# Patient Record
Sex: Female | Born: 1945 | Race: White | Hispanic: No | Marital: Married | Smoking: Never smoker
Health system: Southern US, Community
[De-identification: ages and names within clinical notes are randomized; demographics above are authoritative.]

## PROBLEM LIST (undated history)

## (undated) DIAGNOSIS — E785 Hyperlipidemia, unspecified: Secondary | ICD-10-CM

## (undated) DIAGNOSIS — F341 Dysthymic disorder: Secondary | ICD-10-CM

## (undated) DIAGNOSIS — Z85820 Personal history of malignant melanoma of skin: Secondary | ICD-10-CM

## (undated) DIAGNOSIS — F411 Generalized anxiety disorder: Secondary | ICD-10-CM

## (undated) DIAGNOSIS — I10 Essential (primary) hypertension: Secondary | ICD-10-CM

## (undated) DIAGNOSIS — K589 Irritable bowel syndrome without diarrhea: Secondary | ICD-10-CM

## (undated) DIAGNOSIS — M545 Low back pain: Secondary | ICD-10-CM

## (undated) DIAGNOSIS — K219 Gastro-esophageal reflux disease without esophagitis: Secondary | ICD-10-CM

## (undated) DIAGNOSIS — N182 Chronic kidney disease, stage 2 (mild): Secondary | ICD-10-CM

## (undated) DIAGNOSIS — C801 Malignant (primary) neoplasm, unspecified: Secondary | ICD-10-CM

## (undated) DIAGNOSIS — F1010 Alcohol abuse, uncomplicated: Secondary | ICD-10-CM

## (undated) DIAGNOSIS — Z86010 Personal history of colonic polyps: Secondary | ICD-10-CM

## (undated) DIAGNOSIS — N9489 Other specified conditions associated with female genital organs and menstrual cycle: Secondary | ICD-10-CM

## (undated) DIAGNOSIS — M5030 Other cervical disc degeneration, unspecified cervical region: Secondary | ICD-10-CM

## (undated) DIAGNOSIS — J309 Allergic rhinitis, unspecified: Secondary | ICD-10-CM

## (undated) DIAGNOSIS — F0390 Unspecified dementia without behavioral disturbance: Secondary | ICD-10-CM

## (undated) DIAGNOSIS — K5730 Diverticulosis of large intestine without perforation or abscess without bleeding: Secondary | ICD-10-CM

## (undated) DIAGNOSIS — R413 Other amnesia: Principal | ICD-10-CM

## (undated) HISTORY — DX: Personal history of colonic polyps: Z86.010

## (undated) HISTORY — DX: Hyperlipidemia, unspecified: E78.5

## (undated) HISTORY — DX: Gastro-esophageal reflux disease without esophagitis: K21.9

## (undated) HISTORY — DX: Irritable bowel syndrome without diarrhea: K58.9

## (undated) HISTORY — DX: Personal history of malignant melanoma of skin: Z85.820

## (undated) HISTORY — DX: Other cervical disc degeneration, unspecified cervical region: M50.30

## (undated) HISTORY — DX: Allergic rhinitis, unspecified: J30.9

## (undated) HISTORY — PX: COLONOSCOPY: SHX174

## (undated) HISTORY — DX: Essential (primary) hypertension: I10

## (undated) HISTORY — DX: Diverticulosis of large intestine without perforation or abscess without bleeding: K57.30

## (undated) HISTORY — DX: Malignant (primary) neoplasm, unspecified: C80.1

## (undated) HISTORY — DX: Dysthymic disorder: F34.1

## (undated) HISTORY — DX: Other amnesia: R41.3

## (undated) HISTORY — DX: Generalized anxiety disorder: F41.1

## (undated) HISTORY — DX: Low back pain: M54.5

## (undated) HISTORY — DX: Alcohol abuse, uncomplicated: F10.10

## (undated) HISTORY — PX: APPENDECTOMY: SHX54

---

## 2000-03-08 ENCOUNTER — Other Ambulatory Visit: Admission: RE | Admit: 2000-03-08 | Discharge: 2000-03-08 | Payer: Self-pay | Admitting: Gynecology

## 2001-05-15 ENCOUNTER — Other Ambulatory Visit: Admission: RE | Admit: 2001-05-15 | Discharge: 2001-05-15 | Payer: Self-pay | Admitting: Gynecology

## 2002-03-08 ENCOUNTER — Ambulatory Visit (HOSPITAL_COMMUNITY): Admission: RE | Admit: 2002-03-08 | Discharge: 2002-03-08 | Payer: Self-pay | Admitting: Gastroenterology

## 2002-03-08 ENCOUNTER — Encounter (INDEPENDENT_AMBULATORY_CARE_PROVIDER_SITE_OTHER): Payer: Self-pay | Admitting: *Deleted

## 2002-03-08 DIAGNOSIS — Z860100 Personal history of colon polyps, unspecified: Secondary | ICD-10-CM

## 2002-03-08 HISTORY — DX: Personal history of colonic polyps: Z86.010

## 2002-03-08 HISTORY — DX: Personal history of colon polyps, unspecified: Z86.0100

## 2002-07-17 ENCOUNTER — Other Ambulatory Visit: Admission: RE | Admit: 2002-07-17 | Discharge: 2002-07-17 | Payer: Self-pay | Admitting: Gynecology

## 2003-07-22 ENCOUNTER — Other Ambulatory Visit: Admission: RE | Admit: 2003-07-22 | Discharge: 2003-07-22 | Payer: Self-pay | Admitting: Gynecology

## 2004-07-28 ENCOUNTER — Other Ambulatory Visit: Admission: RE | Admit: 2004-07-28 | Discharge: 2004-07-28 | Payer: Self-pay | Admitting: Gynecology

## 2005-06-16 ENCOUNTER — Ambulatory Visit: Payer: Self-pay | Admitting: Internal Medicine

## 2005-07-15 ENCOUNTER — Ambulatory Visit: Payer: Self-pay | Admitting: Internal Medicine

## 2005-07-22 ENCOUNTER — Ambulatory Visit: Payer: Self-pay | Admitting: Internal Medicine

## 2005-08-12 ENCOUNTER — Other Ambulatory Visit: Admission: RE | Admit: 2005-08-12 | Discharge: 2005-08-12 | Payer: Self-pay | Admitting: Gynecology

## 2005-08-26 ENCOUNTER — Ambulatory Visit: Payer: Self-pay | Admitting: Internal Medicine

## 2006-07-14 ENCOUNTER — Ambulatory Visit: Payer: Self-pay | Admitting: Internal Medicine

## 2006-07-14 LAB — CONVERTED CEMR LAB
Basophils Relative: 0.9 % (ref 0.0–1.0)
CO2: 29 meq/L (ref 19–32)
GFR calc non Af Amer: 78 mL/min
Glucose, Bld: 106 mg/dL — ABNORMAL HIGH (ref 70–99)
Hemoglobin: 13 g/dL (ref 12.0–15.0)
Ketones, ur: NEGATIVE mg/dL
Leukocytes, UA: NEGATIVE
MCV: 91.8 fL (ref 78.0–100.0)
Monocytes Relative: 7.7 % (ref 3.0–11.0)
Neutro Abs: 3.3 10*3/uL (ref 1.4–7.7)
Nitrite: NEGATIVE
Platelets: 194 10*3/uL (ref 150–400)
Potassium: 4.1 meq/L (ref 3.5–5.1)
RBC: 4.15 M/uL (ref 3.87–5.11)
RDW: 11.5 % (ref 11.5–14.6)
Sodium: 142 meq/L (ref 135–145)
Specific Gravity, Urine: 1.01 (ref 1.000–1.03)
Total Protein: 7 g/dL (ref 6.0–8.3)
WBC: 6.1 10*3/uL (ref 4.5–10.5)
pH: 8 (ref 5.0–8.0)

## 2006-07-21 ENCOUNTER — Ambulatory Visit: Payer: Self-pay | Admitting: Internal Medicine

## 2007-06-09 ENCOUNTER — Ambulatory Visit: Payer: Self-pay | Admitting: Gastroenterology

## 2007-06-23 ENCOUNTER — Encounter: Payer: Self-pay | Admitting: Internal Medicine

## 2007-06-23 DIAGNOSIS — J309 Allergic rhinitis, unspecified: Secondary | ICD-10-CM

## 2007-06-23 DIAGNOSIS — F411 Generalized anxiety disorder: Secondary | ICD-10-CM

## 2007-06-23 DIAGNOSIS — N951 Menopausal and female climacteric states: Secondary | ICD-10-CM | POA: Insufficient documentation

## 2007-06-23 DIAGNOSIS — K219 Gastro-esophageal reflux disease without esophagitis: Secondary | ICD-10-CM

## 2007-06-23 DIAGNOSIS — I10 Essential (primary) hypertension: Secondary | ICD-10-CM | POA: Insufficient documentation

## 2007-06-23 DIAGNOSIS — K589 Irritable bowel syndrome without diarrhea: Secondary | ICD-10-CM

## 2007-06-23 HISTORY — DX: Generalized anxiety disorder: F41.1

## 2007-06-23 HISTORY — DX: Essential (primary) hypertension: I10

## 2007-06-23 HISTORY — DX: Allergic rhinitis, unspecified: J30.9

## 2007-06-23 HISTORY — DX: Irritable bowel syndrome, unspecified: K58.9

## 2007-06-23 HISTORY — DX: Gastro-esophageal reflux disease without esophagitis: K21.9

## 2007-06-24 ENCOUNTER — Encounter: Payer: Self-pay | Admitting: Internal Medicine

## 2007-06-24 DIAGNOSIS — E785 Hyperlipidemia, unspecified: Secondary | ICD-10-CM

## 2007-06-24 DIAGNOSIS — M545 Low back pain, unspecified: Secondary | ICD-10-CM

## 2007-06-24 DIAGNOSIS — Z85828 Personal history of other malignant neoplasm of skin: Secondary | ICD-10-CM

## 2007-06-24 HISTORY — DX: Hyperlipidemia, unspecified: E78.5

## 2007-06-24 HISTORY — DX: Low back pain, unspecified: M54.50

## 2007-07-13 ENCOUNTER — Ambulatory Visit: Payer: Self-pay | Admitting: Gastroenterology

## 2007-07-13 LAB — HM COLONOSCOPY

## 2007-07-28 ENCOUNTER — Ambulatory Visit: Payer: Self-pay | Admitting: Internal Medicine

## 2007-07-28 LAB — CONVERTED CEMR LAB
AST: 23 units/L (ref 0–37)
Albumin: 4 g/dL (ref 3.5–5.2)
Alkaline Phosphatase: 85 units/L (ref 39–117)
Basophils Absolute: 0 10*3/uL (ref 0.0–0.1)
Bilirubin, Direct: 0.2 mg/dL (ref 0.0–0.3)
Cholesterol: 178 mg/dL (ref 0–200)
Creatinine, Ser: 0.7 mg/dL (ref 0.4–1.2)
Eosinophils Relative: 9.9 % — ABNORMAL HIGH (ref 0.0–5.0)
GFR calc Af Amer: 110 mL/min
GFR calc non Af Amer: 91 mL/min
Glucose, Bld: 114 mg/dL — ABNORMAL HIGH (ref 70–99)
HCT: 38.9 % (ref 36.0–46.0)
HDL: 49.8 mg/dL (ref >39.0)
Hemoglobin, Urine: NEGATIVE
Hemoglobin: 13.4 g/dL (ref 12.0–15.0)
MCHC: 34.5 g/dL (ref 30.0–36.0)
MCV: 92.2 fL (ref 78.0–100.0)
Monocytes Relative: 6.9 % (ref 3.0–11.0)
Neutro Abs: 3.2 10*3/uL (ref 1.4–7.7)
Neutrophils Relative %: 51.3 % (ref 43.0–77.0)
TSH: 3.25 microintl units/mL (ref 0.35–5.50)
Total Bilirubin: 0.6 mg/dL (ref 0.3–1.2)
Total Protein: 7.3 g/dL (ref 6.0–8.3)
Urine Glucose: NEGATIVE mg/dL
Urobilinogen, UA: 0.2 (ref 0.0–1.0)

## 2007-07-31 ENCOUNTER — Encounter: Payer: Self-pay | Admitting: Internal Medicine

## 2007-08-02 LAB — CONVERTED CEMR LAB
ALT: 24 units/L (ref 0–35)
Alkaline Phosphatase: 85 units/L (ref 39–117)
Basophils Relative: 0.8 % (ref 0.0–1.0)
Bilirubin, Direct: 0.2 mg/dL (ref 0.0–0.3)
CO2: 30 meq/L (ref 19–32)
Chloride: 107 meq/L (ref 96–112)
Crystals: NEGATIVE
Eosinophils Relative: 9.9 % — ABNORMAL HIGH (ref 0.0–5.0)
GFR calc Af Amer: 110 mL/min
Glucose, Bld: 114 mg/dL — ABNORMAL HIGH (ref 70–99)
HDL: 49.8 mg/dL (ref >39.0)
LDL Cholesterol: 103 mg/dL — ABNORMAL HIGH (ref 0–99)
Lymphocytes Relative: 31.1 % (ref 12.0–46.0)
MCHC: 34.5 g/dL (ref 30.0–36.0)
MCV: 92.2 fL (ref 78.0–100.0)
Potassium: 3.8 meq/L (ref 3.5–5.1)
RBC: 4.22 M/uL (ref 3.87–5.11)
Sodium: 143 meq/L (ref 135–145)
TSH: 3.25 microintl units/mL (ref 0.35–5.50)
Total Protein: 7.3 g/dL (ref 6.0–8.3)
Triglycerides: 127 mg/dL (ref 0–149)
VLDL: 25 mg/dL (ref 0–40)
WBC: 6.1 10*3/uL (ref 4.5–10.5)
pH: 6.5 (ref 5.0–8.0)

## 2007-08-04 ENCOUNTER — Ambulatory Visit: Payer: Self-pay | Admitting: Internal Medicine

## 2007-08-04 DIAGNOSIS — K5730 Diverticulosis of large intestine without perforation or abscess without bleeding: Secondary | ICD-10-CM

## 2007-08-04 DIAGNOSIS — Z85820 Personal history of malignant melanoma of skin: Secondary | ICD-10-CM

## 2007-08-04 HISTORY — DX: Diverticulosis of large intestine without perforation or abscess without bleeding: K57.30

## 2007-08-04 HISTORY — DX: Personal history of malignant melanoma of skin: Z85.820

## 2007-08-21 ENCOUNTER — Encounter: Payer: Self-pay | Admitting: Internal Medicine

## 2007-12-18 DIAGNOSIS — M5030 Other cervical disc degeneration, unspecified cervical region: Secondary | ICD-10-CM

## 2007-12-18 HISTORY — DX: Other cervical disc degeneration, unspecified cervical region: M50.30

## 2008-01-16 ENCOUNTER — Ambulatory Visit: Payer: Self-pay | Admitting: Internal Medicine

## 2008-08-16 ENCOUNTER — Ambulatory Visit: Payer: Self-pay | Admitting: Internal Medicine

## 2008-08-16 LAB — CONVERTED CEMR LAB
AST: 23 units/L (ref 0–37)
Albumin: 3.7 g/dL (ref 3.5–5.2)
BUN: 8 mg/dL (ref 6–23)
Basophils Absolute: 0 10*3/uL (ref 0.0–0.1)
Basophils Relative: 0.7 % (ref 0.0–3.0)
CO2: 30 meq/L (ref 19–32)
Creatinine, Ser: 0.8 mg/dL (ref 0.4–1.2)
Glucose, Bld: 98 mg/dL (ref 70–99)
HCT: 37.7 % (ref 36.0–46.0)
Hemoglobin: 13.3 g/dL (ref 12.0–15.0)
Ketones, ur: NEGATIVE mg/dL
LDL Cholesterol: 85 mg/dL (ref 0–99)
MCHC: 35.4 g/dL (ref 30.0–36.0)
Monocytes Absolute: 0.4 10*3/uL (ref 0.1–1.0)
Monocytes Relative: 7.8 % (ref 3.0–12.0)
Mucus, UA: NEGATIVE
Nitrite: NEGATIVE
Platelets: 189 10*3/uL (ref 150–400)
Potassium: 4.1 meq/L (ref 3.5–5.1)
RBC: 4.12 M/uL (ref 3.87–5.11)
RDW: 11.8 % (ref 11.5–14.6)
Sodium: 138 meq/L (ref 135–145)
Total Bilirubin: 0.5 mg/dL (ref 0.3–1.2)
Urine Glucose: NEGATIVE mg/dL

## 2008-08-20 ENCOUNTER — Ambulatory Visit: Payer: Self-pay | Admitting: Internal Medicine

## 2008-08-20 DIAGNOSIS — R9431 Abnormal electrocardiogram [ECG] [EKG]: Secondary | ICD-10-CM

## 2008-08-29 ENCOUNTER — Telehealth (INDEPENDENT_AMBULATORY_CARE_PROVIDER_SITE_OTHER): Payer: Self-pay | Admitting: *Deleted

## 2008-09-04 ENCOUNTER — Encounter: Payer: Self-pay | Admitting: Internal Medicine

## 2008-09-04 ENCOUNTER — Ambulatory Visit: Payer: Self-pay

## 2008-11-20 ENCOUNTER — Telehealth: Payer: Self-pay | Admitting: Internal Medicine

## 2008-11-25 ENCOUNTER — Telehealth: Payer: Self-pay | Admitting: Internal Medicine

## 2009-02-14 ENCOUNTER — Ambulatory Visit: Payer: Self-pay | Admitting: Internal Medicine

## 2009-02-14 DIAGNOSIS — R21 Rash and other nonspecific skin eruption: Secondary | ICD-10-CM

## 2009-02-19 ENCOUNTER — Telehealth: Payer: Self-pay | Admitting: Internal Medicine

## 2009-06-12 ENCOUNTER — Ambulatory Visit: Payer: Self-pay | Admitting: Internal Medicine

## 2009-06-12 DIAGNOSIS — F341 Dysthymic disorder: Secondary | ICD-10-CM

## 2009-06-12 DIAGNOSIS — R42 Dizziness and giddiness: Secondary | ICD-10-CM

## 2009-06-12 HISTORY — DX: Dysthymic disorder: F34.1

## 2009-06-20 ENCOUNTER — Inpatient Hospital Stay (HOSPITAL_COMMUNITY): Admission: AD | Admit: 2009-06-20 | Discharge: 2009-06-22 | Payer: Self-pay | Admitting: Psychiatry

## 2009-06-20 ENCOUNTER — Telehealth: Payer: Self-pay | Admitting: Internal Medicine

## 2009-06-20 ENCOUNTER — Ambulatory Visit: Payer: Self-pay | Admitting: Psychiatry

## 2009-06-26 ENCOUNTER — Ambulatory Visit: Payer: Self-pay | Admitting: Internal Medicine

## 2009-06-26 DIAGNOSIS — E876 Hypokalemia: Secondary | ICD-10-CM | POA: Insufficient documentation

## 2009-06-26 LAB — CONVERTED CEMR LAB
BUN: 11 mg/dL (ref 6–23)
CO2: 31 meq/L (ref 19–32)
Calcium: 9.3 mg/dL (ref 8.4–10.5)
Chloride: 94 meq/L — ABNORMAL LOW (ref 96–112)
Creatinine, Ser: 0.7 mg/dL (ref 0.4–1.2)
Potassium: 3.5 meq/L (ref 3.5–5.1)

## 2009-07-02 ENCOUNTER — Ambulatory Visit (HOSPITAL_COMMUNITY): Payer: Self-pay | Admitting: Psychiatry

## 2009-08-05 LAB — HM MAMMOGRAPHY: HM Mammogram: NORMAL

## 2009-08-08 ENCOUNTER — Ambulatory Visit: Payer: Self-pay | Admitting: Internal Medicine

## 2009-08-08 LAB — CONVERTED CEMR LAB
BUN: 11 mg/dL (ref 6–23)
Basophils Absolute: 0.1 10*3/uL (ref 0.0–0.1)
Basophils Relative: 1.2 % (ref 0.0–3.0)
Bilirubin Urine: NEGATIVE
CO2: 30 meq/L (ref 19–32)
Calcium: 9.3 mg/dL (ref 8.4–10.5)
Chloride: 102 meq/L (ref 96–112)
Eosinophils Relative: 6.1 % — ABNORMAL HIGH (ref 0.0–5.0)
GFR calc non Af Amer: 107.61 mL/min (ref >60)
HDL: 50.2 mg/dL (ref >39.00)
Hemoglobin, Urine: NEGATIVE
Ketones, ur: NEGATIVE mg/dL
LDL Cholesterol: 80 mg/dL (ref 0–99)
Leukocytes, UA: NEGATIVE
Monocytes Relative: 8 % (ref 3.0–12.0)
Neutro Abs: 4.1 10*3/uL (ref 1.4–7.7)
Neutrophils Relative %: 62.8 % (ref 43.0–77.0)
Nitrite: NEGATIVE
Potassium: 3.9 meq/L (ref 3.5–5.1)
Total CHOL/HDL Ratio: 3
Urobilinogen, UA: 1 (ref 0.0–1.0)
VLDL: 13.6 mg/dL (ref 0.0–40.0)
WBC: 6.5 10*3/uL (ref 4.5–10.5)
pH: 6.5 (ref 5.0–8.0)

## 2009-08-15 ENCOUNTER — Ambulatory Visit: Payer: Self-pay | Admitting: Internal Medicine

## 2010-02-12 ENCOUNTER — Ambulatory Visit: Payer: Self-pay | Admitting: Internal Medicine

## 2010-05-04 ENCOUNTER — Telehealth: Payer: Self-pay | Admitting: Internal Medicine

## 2010-06-04 ENCOUNTER — Telehealth: Payer: Self-pay | Admitting: Internal Medicine

## 2010-06-09 ENCOUNTER — Telehealth: Payer: Self-pay | Admitting: Internal Medicine

## 2010-08-12 ENCOUNTER — Ambulatory Visit: Payer: Self-pay | Admitting: Internal Medicine

## 2010-08-13 LAB — CONVERTED CEMR LAB
AST: 26 units/L (ref 0–37)
Albumin: 4.2 g/dL (ref 3.5–5.2)
Alkaline Phosphatase: 93 units/L (ref 39–117)
Basophils Relative: 0.8 % (ref 0.0–3.0)
Bilirubin, Direct: 0.1 mg/dL (ref 0.0–0.3)
Calcium: 9.5 mg/dL (ref 8.4–10.5)
Creatinine, Ser: 0.8 mg/dL (ref 0.4–1.2)
GFR calc non Af Amer: 76.96 mL/min (ref >60)
HDL: 52.8 mg/dL (ref >39.00)
Hemoglobin, Urine: NEGATIVE
Hemoglobin: 13.6 g/dL (ref 12.0–15.0)
Ketones, ur: NEGATIVE mg/dL
LDL Cholesterol: 64 mg/dL (ref 0–99)
Lymphocytes Relative: 24.2 % (ref 12.0–46.0)
Monocytes Relative: 7.7 % (ref 3.0–12.0)
Neutro Abs: 4 10*3/uL (ref 1.4–7.7)
Neutrophils Relative %: 65.3 % (ref 43.0–77.0)
RBC: 4.35 M/uL (ref 3.87–5.11)
Sodium: 138 meq/L (ref 135–145)
Specific Gravity, Urine: 1.01 (ref 1.000–1.030)
Total CHOL/HDL Ratio: 3
Total Protein, Urine: NEGATIVE mg/dL
Total Protein: 7.2 g/dL (ref 6.0–8.3)
Urine Glucose: NEGATIVE mg/dL
Urobilinogen, UA: 1 (ref 0.0–1.0)
VLDL: 15.2 mg/dL (ref 0.0–40.0)
WBC: 6.1 10*3/uL (ref 4.5–10.5)

## 2010-08-19 ENCOUNTER — Encounter: Payer: Self-pay | Admitting: Internal Medicine

## 2010-08-19 ENCOUNTER — Ambulatory Visit: Payer: Self-pay | Admitting: Internal Medicine

## 2010-09-08 ENCOUNTER — Telehealth: Payer: Self-pay | Admitting: Internal Medicine

## 2010-09-16 ENCOUNTER — Telehealth: Payer: Self-pay | Admitting: Internal Medicine

## 2010-10-25 LAB — CONVERTED CEMR LAB: Pap Smear: NORMAL

## 2010-10-27 NOTE — Progress Notes (Signed)
Summary: medication ?  Phone Note Call from Patient Call back at Home Phone [phone] Call back at [phone]   Caller: Patient Summary of Call: Pt states that Deplin is not covered under her insurance plan, is there an alternative? please advise Initial call taken by: Brenton Grills MA,  June 09, 2010 9:50 AM  Follow-up for Phone Call        unfortunately not;  this was a "helpful" medication only (a nutritional suppelement) that some have found helpful;  there is no generic yet and no similar medication;  ok to hold off on this for now if too expensive, as she has her other medication from the recent visit as well Follow-up by: Corwin Levins MD,  June 09, 2010 1:14 PM  Additional Follow-up for Phone Call Additional follow up Details #1::        left message for pt to callback Additional Follow-up by: Brenton Grills MA,  June 09, 2010 1:35 PM    Additional Follow-up for Phone Call Additional follow up Details #2::    pt informed Follow-up by: Brenton Grills MA,  June 09, 2010 2:03 PM

## 2010-10-27 NOTE — Progress Notes (Signed)
Summary: Please advise  Phone Note Call from Patient   Caller: Patient [phone] Summary of Call: Pt called stating that she is still experiencing significant depressive sxs. Pt has been taking medications as Rxd but has not had an improvement. Pt is requesting JWJ advise her, does she need to have another OV to re-evaluate sxs and meds or a referral? Please advise Initial call taken by: Malee Pyle, CMA,  June 04, 2010 9:05 AM  Follow-up for Phone Call        ok for deplin - 1 per day (a sort of vitamin to help with depression), and also referral to D Lugo Follow-up by: Corwin Levins MD,  June 04, 2010 1:15 PM  Additional Follow-up for Phone Call Additional follow up Details #1::        left message on machine for pt to return my call  Windi Pyle, CMA  June 04, 2010 1:42 PM   Pt informed Additional Follow-up by: Mischell Pyle, CMA,  June 04, 2010 1:51 PM    New/Updated Medications: DEPLIN 15 MG TABS (L-METHYLFOLATE) 1po once daily Prescriptions: DEPLIN 15 MG TABS (L-METHYLFOLATE) 1po once daily  #30 x 11   Entered and Authorized by:   Corwin Levins MD   Signed by:   Corwin Levins MD on 06/04/2010   Method used:   Electronically to        [facility] [phone]* (retail)       [address]       [place], [place]  [address]       Ph: [phone]       Fax: [phone]   RxID:   [id number]

## 2010-10-27 NOTE — Progress Notes (Signed)
Summary: Bupropion  Phone Note Call from Patient Call back at [facility] Phone [phone]   Summary of Call: Pt was doing well on bupropion but says pharm changed manuf and she feels it is not working as well. Please advise.  Initial call taken by: Lamar Sprinkles, CMA,  May 04, 2010 6:08 PM  Follow-up for Phone Call        I have no other option at this time except if she can find the previous generic med at the same or different pharmacy;  I can provide a new rx if she wants;  also there is the option of the brand name same med (wellbutrin or budeprioin) but it will be more expensive);  there is no other comparable med of the same type; sometimes mood decreses on the same med after starting within the first 2 to 3 mo 's (the "poop-out effect") - we can also consider adding a low dose SSRI which has been an effective strategy for others Follow-up by: Corwin Levins MD,  May 04, 2010 8:25 PM  Additional Follow-up for Phone Call Additional follow up Details #1::        Pt decided to use brand name Welbutrin product to [facility] [address] Additional Follow-up by: Delaina Pyle, CMA,  May 05, 2010 10:44 AM    Additional Follow-up for Phone Call Additional follow up Details #2::    ok - will do Follow-up by: Corwin Levins MD,  May 05, 2010 1:02 PM  New/Updated Medications: WELLBUTRIN XL 300 MG XR24H-TAB (BUPROPION HCL) 1 by mouth once daily (DAW) Prescriptions: WELLBUTRIN XL 300 MG XR24H-TAB (BUPROPION HCL) 1 by mouth once daily (DAW)  #30 x 11   Entered and Authorized by:   Corwin Levins MD   Signed by:   Corwin Levins MD on 05/05/2010   Method used:   Electronically to        [facility] [phone]* (retail)       [address]       [place], [place]  [address]       Ph: [phone]       Fax: [phone]   RxID:   [id number]

## 2010-10-27 NOTE — Assessment & Plan Note (Signed)
Summary: 6 MTH PHYSICAL--[employer]---STC   Vital Signs:  Patient profile:   65 year old female Height:      63 inches Weight:      175.38 pounds BMI:     31.18 O2 Sat:      93 % on Room air Temp:     98.3 degrees F oral Pulse rate:   77 / minute BP sitting:   112 / 58  (left arm) Cuff size:   regular  Vitals Entered By: Zella Ball Ewing CMA (AAMA) (August 19, 2010 1:15 PM)  O2 Flow:  Room air  Preventive Care Screening     declines flu shot  CC: Adult Physical/RE   CC:  Adult Physical/RE.  History of Present Illness: here for wellness,  Pt denies CP, worsening sob, doe, wheezing, orthopnea, pnd, worsening LE edema, palps, dizziness or syncope  Pt denies new neuro symptoms such as headache, facial or extremity weakness  Pt denies polydipsia, polyuria.  Overall good compliance with meds, trying to follow low chol diet, wt stable, little excercise however  Still struggling with ongoign depression with recent med changes per psychiatry.  Denies worsening suicidal ideation, or panic.  No fever, wt loss, night sweats, loss of appetite or other constitutional symptoms  Overall good compliance with meds, and good tolerability.  Pt states good ability with ADL's, low fall risk, home safety reviewed and adequate, no significant change in hearing or vision, trying to follow lower chol diet, and occasionally active only with regular excercise.   Preventive Screening-Counseling & Management      Drug Use:  no.    Problems Prior to Update: 1)  Hypokalemia  (ICD-276.8) 2)  Anxiety Depression  (ICD-300.4) 3)  Dizziness  (ICD-780.4) 4)  Rash-nonvesicular  (ICD-782.1) 5)  Electrocardiogram, Abnormal  (ICD-794.31) 6)  Preventive Health Care  (ICD-V70.0) 7)  Need Proph Vaccination&inoculat Oth Viral Dz  (ICD-V04.89) 8)  Degenerative Disc Disease, Cervical Spine  (ICD-722.4) 9)  Preventive Health Care  (ICD-V70.0) 10)  Melanoma, Hx of  (ICD-V10.82) 11)  Family History Diabetes 1st Degree Relative   (ICD-V18.0) 12)  Diverticulosis, Colon  (ICD-562.10) 13)  Low Back Pain  (ICD-724.2) 14)  Hyperlipidemia  (ICD-272.4) 15)  Skin Cancer, Hx of  (ICD-V10.83) 16)  Anxiety  (ICD-300.00) 17)  Gerd  (ICD-530.81) 18)  Irritable Bowel Syndrome  (ICD-564.1) 19)  Morbid Obesity  (ICD-278.01) 20)  Colonic Polyps, Hx of  (ICD-V12.72) 21)  Hypertension  (ICD-401.9) 22)  Allergic Rhinitis  (ICD-477.9) 23)  Postmenopausal Status  (ICD-627.2)  Medications Prior to Update: 1)  Crestor 40 Mg Tabs (Rosuvastatin Calcium) .... 1/2 By Mouth Once Daily 2)  Amlodipine Besylate 10 Mg Tabs (Amlodipine Besylate) .Marland Kitchen.. 1po Once Daily- [employer] Member # [id number] 3)  Hydrochlorothiazide 25 Mg  Tabs (Hydrochlorothiazide) .... 1/2 By Mouth Once Daily 4)  Adult Aspirin Ec Low Strength 81 Mg Tbec (Aspirin) .Marland Kitchen.. 1po Once Daily 5)  Benazepril Hcl 40 Mg Tabs (Benazepril Hcl) .Marland Kitchen.. 1 By Mouth Once Daily - [employer] Member # [id number] 6)  Clonazepam 0.5 Mg Tabs (Clonazepam) .Marland Kitchen.. 1 By Mouth Two Times A Day  As Needed 7)  Abilify 5 Mg Tabs (Aripiprazole) .... 1/2  By Mouth Once Daily 8)  Wellbutrin Xl 300 Mg Xr24h-Tab (Bupropion Hcl) .Marland Kitchen.. 1 By Mouth Once Daily (Daw) 9)  Deplin 15 Mg Tabs (L-Methylfolate) .Marland Kitchen.. 1po Once Daily  Current Medications (verified): 1)  Crestor 40 Mg Tabs (Rosuvastatin Calcium) .... 1/2 By Mouth Once Daily 2)  Amlodipine Besylate 10  Mg Tabs (Amlodipine Besylate) .Marland Kitchen.. 1po Once Daily- [employer] Member # [id number] 3)  Adult Aspirin Ec Low Strength 81 Mg Tbec (Aspirin) .Marland Kitchen.. 1po Once Daily 4)  Clonazepam 0.5 Mg Tabs (Clonazepam) .Marland Kitchen.. 1 By Mouth Two Times A Day  As Needed - Per Dr Lugo/psychiatry 5)  Abilify 10 Mg Tabs (Aripiprazole) .Marland Kitchen.. 1po Once Daily 6)  Citalopram Hydrobromide 20 Mg Tabs (Citalopram Hydrobromide) .Marland Kitchen.. 1 By Mouth Once Daily 7)  Benazepril-Hydrochlorothiazide 20-12.5 Mg Tabs (Benazepril-Hydrochlorothiazide) .... 2 By Mouth Once Daily  Allergies (verified): 1)  Codeine Phosphate (Codeine  Phosphate)  Past History:  Past Medical History: Last updated: 08/04/2007 Allergic rhinitis Hypertension Colonic polyps, hx of - 1st at 65yo GERD Anxiety IBS Morbid Obesity Skin cancer, hx of - melanoma Hyperlipidemia Low back pain Diverticulosis, colon  Past Surgical History: Last updated: 06/23/2007 Appendectomy Caesarean section  Family History: Last updated: 08/04/2007 sister born with partial kidney Family History Hypertension Family History Diabetes 1st degree relative father died in accident mother with COPD 1st cousin died with colon ca 63 yo  Social History: Last updated: 08/19/2010 Never Smoked Alcohol use-yes Married 2 children work - Camera operator and accounts receivables Drug use-no  Risk Factors: Smoking Status: never (08/04/2007)  Social History: Reviewed history from 02/14/2009 and no changes required. Never Smoked Alcohol use-yes Married 2 children work - Oncologist Drug use-no Drug Use:  no  Review of Systems  The patient denies anorexia, fever, vision loss, decreased hearing, hoarseness, chest pain, syncope, dyspnea on exertion, peripheral edema, prolonged cough, headaches, hemoptysis, abdominal pain, melena, hematochezia, severe indigestion/heartburn, hematuria, muscle weakness, suspicious skin lesions, transient blindness, difficulty walking, unusual weight change, abnormal bleeding, enlarged lymph nodes, angioedema, and breast masses.         all otherwise negative per pt -    Physical Exam  General:  alert and overweight-appearing.   Head:  normocephalic and atraumatic.   Eyes:  vision grossly intact, pupils equal, and pupils round.   Ears:  R ear normal and L ear normal.   Nose:  no external deformity and no nasal discharge.   Mouth:  no gingival abnormalities and pharynx pink and moist.   Neck:  supple and no masses.   Lungs:  normal respiratory effort and normal breath sounds.   Heart:   normal rate and regular rhythm.   Abdomen:  soft, non-tender, and normal bowel sounds.   Msk:  no joint tenderness and no joint swelling.   Extremities:  no edema, no erythema  Neurologic:  strength normal in all extremities and gait normal.   Skin:  color normal and no rashes.   Psych:  depressed affect and flat affect.     Impression & Recommendations:  Problem # 1:  Preventive Health Care (ICD-V70.0) Overall doing well, age appropriate education and counseling updated, referral for preventive services and immunizations addressed, dietary counseling and smoking status adressed , most recent labs reviewed, ecg reviewed I have personally reviewed and have noted 1.The patient's medical and social history 2.Their use of alcohol, tobacco or illicit drugs 3.Their current medications and supplements 4. Functional ability including ADL's, fall risk, home safety risk, hearing & visual impairment  5.Diet and physical activities 6.Evidence for depression or mood disorders The patients weight, height, BMI  have been recorded in the chart I have made referrals, counseling and provided education to the patient based review of the above  Orders: EKG w/ Interpretation (93000)  Problem # 2:  HYPERTENSION (ICD-401.9)  The  following medications were removed from the medication list:    Hydrochlorothiazide 25 Mg Tabs (Hydrochlorothiazide) .Marland Kitchen... 1/2 by mouth once daily    Benazepril Hcl 40 Mg Tabs (Benazepril hcl) .Marland Kitchen... 1 by mouth once daily - [employer] member # [phone] Her updated medication list for this problem includes:    Amlodipine Besylate 10 Mg Tabs (Amlodipine besylate) .Marland Kitchen... 1po once daily- [employer] member # [id number]    Benazepril-hydrochlorothiazide 20-12.5 Mg Tabs (Benazepril-hydrochlorothiazide) .Marland Kitchen... 2 by mouth once daily  BP today: 112/58 Prior BP: 120/80 (02/12/2010)  Labs Reviewed: K+: 3.9 (08/12/2010) Creat: : 0.8 (08/12/2010)   Chol: 132 (08/12/2010)   HDL: 52.80 (08/12/2010)    LDL: 64 (08/12/2010)   TG: 76.0 (08/12/2010) stable overall by hx and exam, ok to continue meds/tx as is   Complete Medication List: 1)  Crestor 40 Mg Tabs (Rosuvastatin calcium) .... 1/2 by mouth once daily 2)  Amlodipine Besylate 10 Mg Tabs (Amlodipine besylate) .Marland Kitchen.. 1po once daily- [employer] member # [id number] 3)  Adult Aspirin Ec Low Strength 81 Mg Tbec (Aspirin) .Marland Kitchen.. 1po once daily 4)  Clonazepam 0.5 Mg Tabs (Clonazepam) .Marland Kitchen.. 1 by mouth two times a day  as needed - per dr lugo/psychiatry 5)  Abilify 10 Mg Tabs (Aripiprazole) .Marland Kitchen.. 1po once daily 6)  Citalopram Hydrobromide 20 Mg Tabs (Citalopram hydrobromide) .Marland Kitchen.. 1 by mouth once daily 7)  Benazepril-hydrochlorothiazide 20-12.5 Mg Tabs (Benazepril-hydrochlorothiazide) .... 2 by mouth once daily  Patient Instructions: 1)  please keep your appt with GYN for next monday, and consider asking about the bone density test 2)  after you are done with the current bottles for the fluid pill (hydrochlorothiazide) and benazepril  - ok to stop 3)  Then, start the combination pill - benazepril/HCT which is the same medication in one prescription 4)  Continue all previous medications as before this visit  5)  Please keep your followup appt with Dr Dub Mikes as planned 6)  Please schedule a follow-up appointment  in 1 yr with CPX and labs, or sooner if needed Prescriptions: AMLODIPINE BESYLATE 10 MG TABS (AMLODIPINE BESYLATE) 1po once daily- [employer] member # [id number]  #90 x 3   Entered and Authorized by:   Corwin Levins MD   Signed by:   Corwin Levins MD on 08/19/2010   Method used:   Print then Give to Patient   RxID:   [id number] CRESTOR 40 MG TABS (ROSUVASTATIN CALCIUM) 1 by mouth once daily  #90 x 3   Entered and Authorized by:   Corwin Levins MD   Signed by:   Corwin Levins MD on 08/19/2010   Method used:   Print then Give to Patient   RxID:   [id number] BENAZEPRIL-HYDROCHLOROTHIAZIDE 20-12.5 MG TABS (BENAZEPRIL-HYDROCHLOROTHIAZIDE) 2 by mouth  once daily  #180 x 3   Entered and Authorized by:   Corwin Levins MD   Signed by:   Corwin Levins MD on 08/19/2010   Method used:   Print then Give to Patient   RxID:   [id number]    Orders Added: 1)  EKG w/ Interpretation [93000] 2)  Est. Patient 40-64 years [82993]

## 2010-10-27 NOTE — Assessment & Plan Note (Signed)
Summary: 6 mos f/u #?cd   Vital Signs:  Patient profile:   65 year old female Height:      63 inches Weight:      183 pounds BMI:     32.53 O2 Sat:      95 % on Room air Temp:     98.5 degrees F oral Pulse rate:   77 / minute BP sitting:   120 / 80  (left arm) Cuff size:   regular  Vitals Entered ByZella Ball Ewing (Feb 12, 2010 8:06 AM)  O2 Flow:  Room air CC: 6 month followup/RE   CC:  6 month followup/RE.  History of Present Illness: meds help but still with signifincat melancholy, no suicidal ideation, worse on cloudy days and being by herself;  takes dog to work but does not really help; no tearful episodes;  did talk to husband, and counsleing x 1 but did not feel she needed further; gained 10 lbs with meds and ? less active and admits to dietary noncomplaiince;  fatigue not really worse and snores at night, but could "sleep all day"  ;  on the weekends tends to sleep more;  no missed work since seen here last;  enjoys getting out of the house;  ? more sleepy due to lexapro;  overall thinks she is improved and "things dont bother me as much"  but only better about 50 to 80% better; overall course seems to be ongoing over 2 to 3 yrs;  no behavioral issues or known sexual side effects;  Pt denies CP, sob, doe, wheezing, orthopnea, pnd, worsening LE edema, palps, dizziness or syncope  Pt denies new neuro symptoms such as headache, facial or extremity weakness   Problems Prior to Update: 1)  Hypokalemia  (ICD-276.8) 2)  Anxiety Depression  (ICD-300.4) 3)  Dizziness  (ICD-780.4) 4)  Rash-nonvesicular  (ICD-782.1) 5)  Electrocardiogram, Abnormal  (ICD-794.31) 6)  Preventive Health Care  (ICD-V70.0) 7)  Need Proph Vaccination&inoculat Oth Viral Dz  (ICD-V04.89) 8)  Degenerative Disc Disease, Cervical Spine  (ICD-722.4) 9)  Preventive Health Care  (ICD-V70.0) 10)  Melanoma, Hx of  (ICD-V10.82) 11)  Family History Diabetes 1st Degree Relative  (ICD-V18.0) 12)  Diverticulosis, Colon   (ICD-562.10) 13)  Low Back Pain  (ICD-724.2) 14)  Hyperlipidemia  (ICD-272.4) 15)  Skin Cancer, Hx of  (ICD-V10.83) 16)  Anxiety  (ICD-300.00) 17)  Gerd  (ICD-530.81) 18)  Irritable Bowel Syndrome  (ICD-564.1) 19)  Morbid Obesity  (ICD-278.01) 20)  Colonic Polyps, Hx of  (ICD-V12.72) 21)  Hypertension  (ICD-401.9) 22)  Allergic Rhinitis  (ICD-477.9) 23)  Postmenopausal Status  (ICD-627.2)  Medications Prior to Update: 1)  Crestor 40 Mg Tabs (Rosuvastatin Calcium) .... 1/2 By Mouth Once Daily 2)  Amlodipine Besylate 10 Mg Tabs (Amlodipine Besylate) .Marland Kitchen.. 1po Once Daily- [employer] Member # [id number] 3)  Hydrochlorothiazide 25 Mg  Tabs (Hydrochlorothiazide) .... 1/2 By Mouth Once Daily 4)  Adult Aspirin Ec Low Strength 81 Mg Tbec (Aspirin) .Marland Kitchen.. 1po Once Daily 5)  Benazepril Hcl 40 Mg Tabs (Benazepril Hcl) .Marland Kitchen.. 1 By Mouth Once Daily - [employer] Member # [id number] 6)  Lexapro 20 Mg Tabs (Escitalopram Oxalate) .... 1/2 Po Once Daily 7)  Abilify 5 Mg Tabs (Aripiprazole) .... 1/2  By Mouth Once Daily  Current Medications (verified): 1)  Crestor 40 Mg Tabs (Rosuvastatin Calcium) .... 1/2 By Mouth Once Daily 2)  Amlodipine Besylate 10 Mg Tabs (Amlodipine Besylate) .Marland Kitchen.. 1po Once Daily- [employer] Member # [id number] 3)  Hydrochlorothiazide 25 Mg  Tabs (Hydrochlorothiazide) .... 1/2 By Mouth Once Daily 4)  Adult Aspirin Ec Low Strength 81 Mg Tbec (Aspirin) .Marland Kitchen.. 1po Once Daily 5)  Benazepril Hcl 40 Mg Tabs (Benazepril Hcl) .Marland Kitchen.. 1 By Mouth Once Daily - [employer] Member # [id number] 6)  Clonazepam 0.5 Mg Tabs (Clonazepam) .Marland Kitchen.. 1 By Mouth Two Times A Day  As Needed 7)  Abilify 5 Mg Tabs (Aripiprazole) .... 1/2  By Mouth Once Daily 8)  Bupropion Hcl 300 Mg Xr24h-Tab (Bupropion Hcl) .Marland Kitchen.. 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   Head:  normocephalic and atraumatic.   Eyes:  vision grossly intact, pupils equal, and pupils round.   Ears:  R ear normal and L ear normal.   Nose:  no external deformity and no nasal discharge.   Mouth:  no gingival abnormalities and pharynx pink and moist.   Neck:  supple and no masses.   Lungs:  normal respiratory effort and normal breath sounds.   Heart:  normal rate and regular rhythm.   Extremities:  .noo  Neurologic:  strength normal in all extremities and gait normal.   Psych:  depressed affect and moderately anxious.     Impression & Recommendations:  Problem # 1:  ANXIETY DEPRESSION (ICD-300.4) to taper lexapro, and start the wellbutrin, cont the ability for now;  add clonopin as needed,  if not more improved would consider psychiatry referralk  Problem # 2:  HYPERTENSION (ICD-401.9)  Her updated medication list for this problem includes:    Amlodipine Besylate 10 Mg Tabs (Amlodipine besylate) .Marland Kitchen... 1po once daily- [employer] member # [id number]    Hydrochlorothiazide 25 Mg Tabs (Hydrochlorothiazide) .Marland Kitchen... 1/2 by mouth once daily    Benazepril Hcl 40 Mg Tabs (Benazepril hcl) .Marland Kitchen... 1 by mouth once daily - [employer] member # [id number]  BP today: 120/80 Prior BP: 102/62 (08/15/2009)  Labs Reviewed: K+: 3.9 (08/08/2009) Creat: : 0.6 (08/08/2009)   Chol: 144 (08/08/2009)   HDL: 50.20 (08/08/2009)   LDL: 80 (08/08/2009)   TG: 68.0  (08/08/2009) stable overall by hx and exam, ok to continue meds/tx as is   Complete Medication List: 1)  Crestor 40 Mg Tabs (Rosuvastatin calcium) .... 1/2 by mouth once daily 2)  Amlodipine Besylate 10 Mg Tabs (Amlodipine besylate) .Marland Kitchen.. 1po once daily- [employer] member # [id number] 3)  Hydrochlorothiazide 25 Mg Tabs (Hydrochlorothiazide) .... 1/2 by mouth once daily 4)  Adult Aspirin Ec Low Strength 81 Mg Tbec (Aspirin) .Marland Kitchen.. 1po once daily 5)  Benazepril Hcl 40 Mg Tabs (Benazepril hcl) .Marland Kitchen.. 1 by mouth once daily - [employer] member # [id number] 6)  Clonazepam 0.5 Mg Tabs (Clonazepam) .Marland Kitchen.. 1 by mouth two times a day  as needed 7)  Abilify 5 Mg Tabs (Aripiprazole) .... 1/2  by mouth once daily 8)  Bupropion Hcl 300 Mg Xr24h-tab (  Bupropion hcl) .Marland Kitchen.. 1po once daily (starting the second month of treatment)  Patient Instructions: 1)  start the generic wellbutrin ER (buproprion) at 150 mg per day for one wk, then 2 of the 150 mg pills for 3 wks (total of 300 mg) , then take the 300 mg pill from the second prescription after that 2)  continue the lexapro 10 mg per day for 2 wks, then HALF pill (5 mg) for 2 wks, then stop 3)  continue the abilify without change 4)  take the clonozapam as needed for nerves 5)  call ion 1 to 2 months if it seems this is not working well, to consider referral back to Dr Dub Mikes 6)  Continue all previous medications as before this visit  7)  Please schedule a follow-up appointment in 6 months with CPX labs Prescriptions: CLONAZEPAM 0.5 MG TABS (CLONAZEPAM) 1 by mouth two times a day  as needed  #60 x 2   Entered and Authorized by:   Corwin Levins MD   Signed by:   Corwin Levins MD on 02/12/2010   Method used:   Print then Give to Patient   RxID:   [id number] BUPROPION HCL 300 MG XR24H-TAB (BUPROPION HCL) 1po once daily (starting the second month of treatment)  #30 x 11   Entered and Authorized by:   Corwin Levins MD   Signed by:   Corwin Levins MD on 02/12/2010   Method used:    Print then Give to Patient   RxID:   [id number] BUPROPION HCL 150 MG XR24H-TAB (BUPROPION HCL) 1 by mouth two times a day  #60 x 0   Entered and Authorized by:   Corwin Levins MD   Signed by:   Corwin Levins MD on 02/12/2010   Method used:   Print then Give to Patient   RxID:   [id number]

## 2010-10-29 NOTE — Progress Notes (Signed)
Summary: Rx req  Phone Note Refill Request Message from:  Patient  Refills Requested: Medication #1:  AMLODIPINE BESYLATE 10 MG TABS 1po once daily- [facility] member # [id number]   Dosage confirmed as above?Dosage Confirmed   Supply Requested: 1 year  Medication #2:  CRESTOR 40 MG TABS 1/2 by mouth once daily   Dosage confirmed as above?Dosage Confirmed   Supply Requested: 1 year  Medication #3:  BENAZEPRIL-HYDROCHLOROTHIAZIDE 20-12.5 MG TABS 2 by mouth once daily.   Dosage confirmed as above?Dosage Confirmed   Supply Requested: 1 year Pt called requesting Rxs to [facility]. Pt states that she is almost out of meds and the fastest way to have them filled per [facility] is via fax from PCP   Method Requested: Electronic Initial call taken by: Cilicia Pyle, CMA,  September 08, 2010 3:12 PM    Prescriptions: BENAZEPRIL-HYDROCHLOROTHIAZIDE 20-12.5 MG TABS (BENAZEPRIL-HYDROCHLOROTHIAZIDE) 2 by mouth once daily  #180 x 3   Entered and Authorized by:   Maanasa Pyle, CMA   Signed by:   Allahna Pyle, CMA on 09/08/2010   Method used:   Faxed to ...       [facility] (mail-order)             , [place]         Ph: [phone]       Fax: [phone]   RxID:   [id number] AMLODIPINE BESYLATE 10 MG TABS (AMLODIPINE BESYLATE) 1po once daily- [facility] member # [id number]  #90 x 3   Entered and Authorized by:   Tanice Pyle, CMA   Signed by:   Akeema Pyle, CMA on 09/08/2010   Method used:   Faxed to ...       [facility] (mail-order)             , [place]         Ph: [phone]       Fax: [phone]   RxID:   [id number] CRESTOR 40 MG TABS (ROSUVASTATIN CALCIUM) 1/2 by mouth once daily  #90 x 3   Entered and Authorized by:   Santosha Pyle, CMA   Signed by:   Evelin Pyle, CMA on 09/08/2010   Method used:   Faxed to ...       [facility] (mail-order)             , [place]         Ph: [phone]       Fax: [phone]   RxID:   [id number]

## 2010-10-29 NOTE — Progress Notes (Signed)
Summary: RX REQ  Phone Note Refill Request Message from:  Patient on September 16, 2010 10:54 AM  Refills Requested: Medication #1:  CRESTOR 40 MG TABS 1/2 by mouth once daily   Dosage confirmed as above?Dosage Confirmed   Supply Requested: 6 months  Medication #2:  BENAZEPRIL-HYDROCHLOROTHIAZIDE 20-12.5 MG TABS 2 by mouth once daily.   Dosage confirmed as above?Dosage Confirmed   Supply Requested: 6 months  Medication #3:  AMLODIPINE BESYLATE 10 MG TABS 1po once daily- [facility] member # [id number]   Dosage confirmed as above?Dosage Confirmed   Supply Requested: 6 months  Method Requested: Electronic Initial call taken by: Ronnisha Pyle, CMA,  September 16, 2010 10:54 AM    Prescriptions: BENAZEPRIL-HYDROCHLOROTHIAZIDE 20-12.5 MG TABS (BENAZEPRIL-HYDROCHLOROTHIAZIDE) 2 by mouth once daily  #180 x 3   Entered by:   Ariya Pyle, CMA   Authorized by:   Corwin Levins MD   Signed by:   Cecil Pyle, CMA on 09/16/2010   Method used:   Faxed to ...       [facility] (mail-[facility])             , [place]         Ph: [phone]       Fax: [phone]   RxID:   [id number] CRESTOR 40 MG TABS (ROSUVASTATIN CALCIUM) 1/2 by mouth once daily  #90 x 3   Entered by:   Stellarose Pyle, CMA   Authorized by:   Corwin Levins MD   Signed by:   Radley Pyle, CMA on 09/16/2010   Method used:   Faxed to ...       [facility] (mail-[facility])             , [place]         Ph: [phone]       Fax: [phone]   RxID:   [id number] AMLODIPINE BESYLATE 10 MG TABS (AMLODIPINE BESYLATE) 1po once daily- [facility] member # [id number]  #90 x 3   Entered by:   Alyannah Pyle, CMA   Authorized by:   Corwin Levins MD   Signed by:   Yennifer Pyle, CMA on 09/16/2010   Method used:   Faxed to ...       [facility] (mail-order)             , [place]         Ph: [phone]       Fax: [phone]   RxID:   [id number]

## 2011-01-01 LAB — CBC
Hemoglobin: 12.8 g/dL (ref 12.0–15.0)
RBC: 4.1 MIL/uL (ref 3.87–5.11)
RDW: 12.7 % (ref 11.5–15.5)

## 2011-01-01 LAB — COMPREHENSIVE METABOLIC PANEL
ALT: 20 U/L (ref 0–35)
AST: 21 U/L (ref 0–37)
Alkaline Phosphatase: 85 U/L (ref 39–117)
CO2: 28 mEq/L (ref 19–32)
GFR calc Af Amer: >60 mL/min (ref >60)
Glucose, Bld: 101 mg/dL — ABNORMAL HIGH (ref 70–99)
Potassium: 2.9 mEq/L — ABNORMAL LOW (ref 3.5–5.1)
Sodium: 130 mEq/L — ABNORMAL LOW (ref 135–145)
Total Protein: 7 g/dL (ref 6.0–8.3)

## 2011-01-01 LAB — TSH: TSH: 3.745 u[IU]/mL (ref 0.350–4.500)

## 2011-02-01 ENCOUNTER — Encounter: Payer: Self-pay | Admitting: Internal Medicine

## 2011-02-01 ENCOUNTER — Ambulatory Visit (INDEPENDENT_AMBULATORY_CARE_PROVIDER_SITE_OTHER): Payer: 59 | Admitting: Internal Medicine

## 2011-02-01 VITALS — BP 110/68 | HR 76 | Temp 98.5°F | Ht 66.0 in | Wt 152.8 lb

## 2011-02-01 DIAGNOSIS — F411 Generalized anxiety disorder: Secondary | ICD-10-CM

## 2011-02-01 DIAGNOSIS — I10 Essential (primary) hypertension: Secondary | ICD-10-CM

## 2011-02-01 DIAGNOSIS — Z0000 Encounter for general adult medical examination without abnormal findings: Secondary | ICD-10-CM

## 2011-02-01 DIAGNOSIS — M5030 Other cervical disc degeneration, unspecified cervical region: Secondary | ICD-10-CM

## 2011-02-01 DIAGNOSIS — M542 Cervicalgia: Secondary | ICD-10-CM

## 2011-02-01 MED ORDER — OXYCODONE-ACETAMINOPHEN 5-325 MG PO TABS: 1.0000 | ORAL_TABLET | Freq: Four times a day (QID) | ORAL | Status: DC | PRN

## 2011-02-01 MED ORDER — CYCLOBENZAPRINE HCL 5 MG PO TABS: 5.0000 mg | ORAL_TABLET | Freq: Three times a day (TID) | ORAL | Status: DC | PRN

## 2011-02-01 MED ORDER — DIAZEPAM 5 MG PO TABS: 5.0000 mg | ORAL_TABLET | Freq: Two times a day (BID) | ORAL | Status: DC | PRN

## 2011-02-01 NOTE — Patient Instructions (Signed)
Take all new medications as prescribed Continue all other medications as before If not improved in 2-3 days, you may need to call psychiatry regarding an abilify holiday You may also need to  Consider a referral to orthopedic, or the [facility]

## 2011-02-01 NOTE — Progress Notes (Signed)
  Subjective:    Patient ID: Tara Shaffer, female    DOB: May 25, 1946, 65 y.o.   MRN: [id number]  HPI Here with one wk onset right post lat neck pain and post head pain, mod to severe, somewhat worse as the wk went on,  Worse to lift the head normally, and better to lie down so the head is supported on pillow without bending the neck; no bowel or bladder change, fever, wt loss,  worsening LE pain/numbness/weakness, gait change or falls, but has had some pain to left trapeoid area as well.  No obvious inciting factor such as heavy lifting.  Pt denies chest pain, increased sob or doe, wheezing, orthopnea, PND, increased LE swelling, palpitations, dizziness or syncope.  Pt denies new neurological symptoms such as new headache, or facial or extremity weakness or numbness  Pt denies polydipsia, polyuria.  Pt states overall good compliance with meds, trying to follow lower cholesterol diet, wt overall stable but little exercise however.    Past Medical History  Diagnosis Date  . MELANOMA, HX OF 08/04/2007  . ALLERGIC RHINITIS 06/23/2007  . ANXIETY DEPRESSION 06/12/2009  . ANXIETY 06/23/2007  . 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 GERD 06/23/2007  . HYPERLIPIDEMIA 06/24/2007  . HYPERTENSION 06/23/2007  . COLONIC POLYPS, HX OF 03/08/2002  . DIVERTICULOSIS, COLON 08/04/2007  . Irritable bowel syndrome 06/23/2007   No past surgical history on file.  reports that she has never smoked. She does not have any smokeless tobacco history on file. Her alcohol and drug histories not on file. family history is not on file. Allergies  Allergen Reactions  . Codeine Phosphate     REACTION: nausea   No current outpatient prescriptions on file prior to visit.   Review of Systems All otherwise neg per pt     Objective:   Physical Exam BP 110/68  Pulse 76  Temp(Src) 98.5 F (36.9 C) (Oral)  Ht 5\' 6"  (1.676 m)  Wt 152 lb 12 oz (69.287 kg)  BMI 24.65 kg/m2  SpO2 95% Physical Exam  VS  noted Constitutional: Pt appears well-developed and well-nourished.  HENT: Head: Normocephalic.  Right Ear: External ear normal.  Left Ear: External ear normal.  Eyes: Conjunctivae and EOM are normal. Pupils are equal, round, and reactive to light.  Neck: Normal range of motion. Neck supple.  Cardiovascular: Normal rate and regular rhythm.   Pulmonary/Chest: Effort normal and breath sounds normal.  Abd:  Soft, NT, non-distended, + BS Neurological: Pt is alert. No cranial nerve deficit. Motor/sens/dtr's intact Skin: Skin is warm. No erythema.  Psychiatric: Pt behavior is normal. Thought content normal.  Severe torticollis like spasm or the right post lat neck but no SCM spasm or tenderness, spine nontender throughout       Assessment & Plan:

## 2011-02-01 NOTE — Assessment & Plan Note (Addendum)
Rather severe MSK spasm postlat on the right with left trapazoid tender as well ;  Diff includes drug reaction such as the abilify, vs MSK spasm/strain vs some kind of involuntary movement disorder;  For today will start with pain med, muscle relaxer and just a few days of valium lower dose;  She may want to talk to psych regarding an abilfy holiday trial, and consider referral to the [facility] involuntary movement d/o clinic as well, or also consider ortho eval with known hx of c-spine DDD; note given for off work as well for 2 days

## 2011-02-07 ENCOUNTER — Encounter: Payer: Self-pay | Admitting: Internal Medicine

## 2011-02-07 NOTE — Assessment & Plan Note (Addendum)
stable overall by hx and exam, most recent lab reviewed with pt, and pt to continue medical treatment as before, to cont to f/u with psychiatry, and consider abilify holiday

## 2011-02-07 NOTE — Assessment & Plan Note (Signed)
stable overall by hx and exam, most recent lab reviewed with pt, and pt to continue medical treatment as before  BP Readings from Last 3 Encounters:  02/01/11 110/68  08/19/10 112/58  02/12/10 120/80

## 2011-02-07 NOTE — Assessment & Plan Note (Signed)
O/w stable overall by hx and exam, most recent lab reviewed with pt, and pt to continue medical treatment as before  Lab Results  Component Value Date   WBC 6.1 08/12/2010   HGB 13.6 08/12/2010   HCT 39.0 08/12/2010   PLT 203.0 08/12/2010   CHOL 132 08/12/2010   TRIG 76.0 08/12/2010   HDL 52.80 08/12/2010   ALT 27 08/12/2010   AST 26 08/12/2010   NA 138 08/12/2010   K 3.9 08/12/2010   CL 99 08/12/2010   CREATININE 0.8 08/12/2010   BUN 10 08/12/2010   CO2 29 08/12/2010   TSH 2.07 08/12/2010   HGBA1C 5.7 07/28/2007

## 2011-02-09 ENCOUNTER — Ambulatory Visit (INDEPENDENT_AMBULATORY_CARE_PROVIDER_SITE_OTHER)
Admission: RE | Admit: 2011-02-09 | Discharge: 2011-02-09 | Disposition: A | Discharge status: Home / Self Care | Payer: 59 | Source: Ambulatory Visit | Attending: Internal Medicine | Admitting: Internal Medicine

## 2011-02-09 ENCOUNTER — Encounter: Payer: Self-pay | Admitting: Internal Medicine

## 2011-02-09 ENCOUNTER — Ambulatory Visit (INDEPENDENT_AMBULATORY_CARE_PROVIDER_SITE_OTHER): Payer: 59 | Admitting: Internal Medicine

## 2011-02-09 VITALS — BP 82/54 | HR 83 | Temp 98.2°F | Ht 64.0 in | Wt 156.2 lb

## 2011-02-09 DIAGNOSIS — M25519 Pain in unspecified shoulder: Secondary | ICD-10-CM

## 2011-02-09 DIAGNOSIS — I10 Essential (primary) hypertension: Secondary | ICD-10-CM

## 2011-02-09 DIAGNOSIS — M542 Cervicalgia: Secondary | ICD-10-CM

## 2011-02-09 DIAGNOSIS — M25512 Pain in left shoulder: Secondary | ICD-10-CM

## 2011-02-09 MED ORDER — BENAZEPRIL-HYDROCHLOROTHIAZIDE 20-12.5 MG PO TABS: 1.0000 | ORAL_TABLET | Freq: Every day | ORAL | Status: DC

## 2011-02-09 NOTE — Progress Notes (Signed)
Subjective:    Patient ID: Tara Shaffer, female    DOB: 06-Jun-1946, 65 y.o.   MRN: [id number]  HPI  Here to f/u for neck pain, did seem improved initially and able to hold head up better with tx as per last visit, is off the abilify with f/u with psychiatry later this wk, has some occasional confusion and memory problem on current meds per husband (will occasionally ask same question 4 times in one hour) and pt states overall stalbe psychiatrically, but unfortunately despite heating pad which also helps her post lat left neck pain has increased again, though not quite as severe as initial.  Unable to hold head normally erect due to pain on raising the head to the left lateral with persistent visible swelling of the left trapezoid area (though some less tender overall);  Has minimal tender or pain to the spine itself and has no radicular symptoms or UE pain/weakness/numbness.  No fever, trauma, rash, recent wt loss or other unusual night sweats or fatigue. Past Medical History  Diagnosis Date  . MELANOMA, HX OF 08/04/2007  . ALLERGIC RHINITIS 06/23/2007  . ANXIETY DEPRESSION 06/12/2009  . ANXIETY 06/23/2007  . 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 GERD 06/23/2007  . HYPERLIPIDEMIA 06/24/2007  . HYPERTENSION 06/23/2007  . COLONIC POLYPS, HX OF 03/08/2002  . DIVERTICULOSIS, COLON 08/04/2007  . Irritable bowel syndrome 06/23/2007   No past surgical history on file.  reports that she has never smoked. She does not have any smokeless tobacco history on file. Her alcohol and drug histories not on file. family history is not on file. Allergies  Allergen Reactions  . Codeine Phosphate     REACTION: nausea   Current Outpatient Prescriptions on File Prior to Visit  Medication Sig Dispense Refill  . amLODipine (NORVASC) 10 MG tablet Take 10 mg by mouth daily.        . benazepril-hydrochlorthiazide (LOTENSIN HCT) 20-12.5 MG per tablet Take 1 tablet by mouth 2 (two) times daily.          . benztropine (COGENTIN) 1 MG tablet Take 1 mg by mouth 2 (two) times daily.        . cyclobenzaprine (FLEXERIL) 5 MG tablet Take 1 tablet (5 mg total) by mouth 3 (three) times daily as needed for muscle spasms.  60 tablet  1  . diazepam (VALIUM) 5 MG tablet Take 1 tablet (5 mg total) by mouth every 12 (twelve) hours as needed (for neck spasms as needed).  20 tablet  0  . LORazepam (ATIVAN) 0.5 MG tablet Take 0.5 mg by mouth daily.        . rosuvastatin (CRESTOR) 40 MG tablet Take 40 mg by mouth. Take 1/2 once daily       . aspirin 81 MG EC tablet Take 81 mg by mouth daily.        . citalopram (CELEXA) 20 MG tablet Take 20 mg by mouth daily.        Marland Kitchen gabapentin (NEURONTIN) 100 MG capsule Take 100 mg by mouth 3 (three) times daily.        Marland Kitchen oxyCODONE-acetaminophen (ROXICET) 5-325 MG per tablet Take 1 tablet by mouth every 6 (six) hours as needed for pain.  50 tablet  0   Review of Systems Review of Systems  Constitutional: Negative for diaphoresis, activity change, appetite change and unexpected weight change.  HENT: Negative for hearing loss, ear pain, facial swelling, mouth sores and neck stiffness.   Eyes: Negative  for pain, redness and visual disturbance.  Respiratory: Negative for shortness of breath and wheezing.   Cardiovascular: Negative for chest pain and palpitations.  Gastrointestinal: Negative for diarrhea, blood in stool, abdominal distention and rectal pain.  Genitourinary: Negative for hematuria, flank pain and decreased urine volume.  Musculoskeletal: Negative for myalgias and joint swelling.  Skin: Negative for color change and wound.  Neurological: Negative for syncope and numbness.  Hematological: Negative for adenopathy.  Psychiatric/Behavioral: Negative for hallucinations, self-injury, decreased concentration and agitation.      Objective:   Physical Exam BP 82/54  Pulse 83  Temp(Src) 98.2 F (36.8 C) (Oral)  Ht 5\' 4"  (1.626 m)  Wt 156 lb 4 oz (70.875 kg)  BMI  26.82 kg/m2  SpO2 93% Physical Exam  VS noted Constitutional: Pt is oriented to person, place, and time. Appears well-developed and well-nourished.  HENT:  Head: Normocephalic and atraumatic.  Right Ear: External ear normal.  Left Ear: External ear normal.  Nose: Nose normal.  Mouth/Throat: Oropharynx is clear and moist.  Eyes: Conjunctivae and EOM are normal. Pupils are equal, round, and reactive to light.  Neck: Normal range of motion. Neck supple. No JVD present. No tracheal deviation present. left post lat neck/trapezoid with mild to mod tender, swelling without rash and pt forced to keep head looking down and to the right somwhat,  Also with reduced forward elev and abduciton of the left shoulder with pain on doing so Cardiovascular: Normal rate, regular rhythm, normal heart sounds and intact distal pulses.   Pulmonary/Chest: Effort normal and breath sounds normal.  Abdominal: Soft. Bowel sounds are normal. There is no tenderness.  Musculoskeletal: Normal range of motion. Exhibits no edema.  Lymphadenopathy:  Has no cervical adenopathy.  Neurological: Pt is alert and oriented to person, place, and time. Pt has normal reflexes. No cranial nerve deficit. Motor/sens/dtr's intact throughtou Skin: Skin is warm and dry. No rash noted.  Psychiatric:  Has  normal mood and affect. Behavior is normal.         Assessment & Plan:

## 2011-02-09 NOTE — Assessment & Plan Note (Signed)
Some improved, then worse again, will ask for plain films today, including neck, cxr, also for c-spine mri, and refer ortho, may need PT but would see ortho first, Continue all other medications as before

## 2011-02-09 NOTE — Progress Notes (Signed)
Quick Note:  Voice message left on PhoneTree system - lab is negative, normal or otherwise stable, pt to continue same tx ______ 

## 2011-02-09 NOTE — Patient Instructions (Addendum)
Your blood pressure was low today Please only take the lotensin HCT in the AM only for now Please check your blood pressure regularly and call for BP > 140/90 Continue all other medications as before Please go to XRAY in the Basement for the x-ray test Please call the phone number [phone] (the [employer]Tree System) for results of testing in 2-3 days;  When calling, simply dial the number, and when prompted enter the MRN number above (the Medical Record Number) and the # key, then the message should start. You will be contacted regarding the referral for: orthopedic

## 2011-02-09 NOTE — Assessment & Plan Note (Signed)
Moderate with abduction , will check film, refer ortho

## 2011-02-09 NOTE — Assessment & Plan Note (Signed)
[facility]                         GASTROENTEROLOGY OFFICE NOTE   NAME:CHIASSONMikalyn, Hermida                  MRN:          [id number]  DATE:06/09/2007                            DOB:          Jan 09, 1946    REASON FOR CONSULTATION:  History of colon polyps.   Ms. Pederson is a pleasant 65 year old white female here to set up a  followup colonoscopy.  She has a history of colon polyps, and was last  examined in 2003, where a 10-mm polyp was removed.  She has no GI  complaints, including change in bowel habits, abdominal pain, melena, or  hematochezia.  She does have occasional gas and bloating tough.   PAST MEDICAL HISTORY:  Pertinent for hypertension.   FAMILY HISTORY:  Noncontributory.   MEDICATIONS:  Include Lotrel, Crestor, baby aspirin.   SHE HAS NO ALLERGIES.   She does not smoke.  She drinks several glasses of wine a day.  She is  married, and is a full time homemaker.   REVIEW OF SYSTEMS:  Positive for night sweats.   PHYSICAL EXAMINATION:  Pulse is 76.  Blood pressure 134/82.  Weight 170.  HEENT: EOMI.  PERRLA.  Sclerae are anicteric.  Conjunctivae are pink.  NECK:  Supple without thyromegaly, adenopathy or carotid bruits.  CHEST:  Clear to auscultation and percussion without adventitious  sounds.  CARDIAC:  Regular rhythm; normal S1 S2.  There are no murmurs, gallops  or rubs.  ABDOMEN:  Bowel sounds are normoactive.  Abdomen is soft, nontender and  nondistended.  There are no abdominal masses, tenderness, splenic  enlargement or hepatomegaly.  EXTREMITIES:  Full range of motion.  No cyanosis, clubbing or edema.  RECTAL:  Deferred.   IMPRESSION:  1. Personal history of colon polyps.  2. Hypertension.   RECOMMENDATION:  Followup colonoscopy.     Barbette Hair. Arlyce Dice, MD,FACG  Electronically Signed   RDK/MedQ  DD: 06/09/2007  DT: 06/10/2007  Job #: [id number]

## 2011-02-09 NOTE — Assessment & Plan Note (Addendum)
low overall by hx and exam, most recent lab reviewed with pt, and pt to continue medical treatment as before Except to take HALF per day BP Readings from Last 3 Encounters:  02/09/11 82/54  02/01/11 110/68  08/19/10 112/58

## 2011-02-11 ENCOUNTER — Telehealth: Payer: Self-pay

## 2011-02-11 NOTE — Telephone Encounter (Signed)
Pt called stating that she was advised at Ortho Dr Shon Baton office that she will not be able to be seen until next Thursday May 24th. Per pt office advised her that she may be able to be seen sooner by another MD but okay (new referral) will need to be send from Dr Jonny Ruiz before they can schedule pt.

## 2011-02-11 NOTE — Telephone Encounter (Signed)
Do I really need to do another referral, or can this be handled by [facility], to just change the orthopedic MD?  Thanks , let me know

## 2011-06-23 ENCOUNTER — Telehealth: Payer: Self-pay

## 2011-06-23 DIAGNOSIS — F0391 Unspecified dementia with behavioral disturbance: Secondary | ICD-10-CM | POA: Insufficient documentation

## 2011-06-23 DIAGNOSIS — R413 Other amnesia: Secondary | ICD-10-CM

## 2011-06-23 HISTORY — DX: Other amnesia: R41.3

## 2011-06-23 NOTE — Telephone Encounter (Signed)
Pt's spouse called on behalf of pt. Requesting a referral to Dr Sandria Manly Neurologist.   Return call to [phone] cell - Ra[place]ord Noble

## 2011-06-23 NOTE — Telephone Encounter (Signed)
Need to know why she is reqeusting this, to make sure seems appropriate

## 2011-06-23 NOTE — Telephone Encounter (Signed)
Pt's spouse described pt as increasingly forgetful. Spouse also states that their son sees Dr Sandria Manly and he suggested she be seen but referral is needed.

## 2011-06-23 NOTE — Telephone Encounter (Signed)
Done per emr 

## 2011-06-24 NOTE — Telephone Encounter (Signed)
Spouse advised and will expect a call form [facility] with appt info

## 2011-06-29 ENCOUNTER — Ambulatory Visit (INDEPENDENT_AMBULATORY_CARE_PROVIDER_SITE_OTHER): Payer: 59 | Admitting: Neurology

## 2011-06-29 ENCOUNTER — Encounter: Payer: Self-pay | Admitting: Neurology

## 2011-06-29 VITALS — BP 88/58 | HR 88 | Wt 129.0 lb

## 2011-06-29 DIAGNOSIS — G238 Other specified degenerative diseases of basal ganglia: Secondary | ICD-10-CM

## 2011-06-29 NOTE — Patient Instructions (Signed)
Your MRI is scheduled for Wednesday, October 3rd at 3:00pm.  Please arrive to [facility] by 2:45pm

## 2011-06-29 NOTE — Progress Notes (Signed)
Dear Dr. Jonny Ruiz,   Thank you for having me see the patient in consultation for her problems with gait and memory dysfunction.  The patient's troubles began with a depressive episode 2 years ago.  This was supposedly triggered by her son saying that he was separating from his wife.  It resulted in her being hospitalized for three days.  She was put on medications during that inpatient stay.  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  Before being restarted on psychotropic medications she also had difficulty walking, with worsening shuffling gait.  About 3 months ago she developed forward flexion of her neck, with pain in the back of the neck.  She feels like her neck is being pulled forward.  She has had an MRI of her C-spine that was reportedly unremarkable and has been tried on a variety of medications for it.  She also has had several falls.  In addition, what prompted this visit was over the last 3 weeks she had worsening asking multiple questions multiple times even when she seemed to know the answer.  This was concurrent to her being started on zolpidem.  She has since stopped the zolpidem, and seems to be a bit better.  She typically has difficulty staying asleep, but no falling asleep.  With respect to her psychiatric history she has never had a problem with depression before 2 years ago.  She has had a long term history of phobia, in particular being left alone.  She does not seem to have obsessions or compulsions.  She has had no hallucinations or attempts or desire to self harm.  She is followed by Dr. Geoffery Lyons for her psychiatric problems.  She is not having word finding problems or pausing in sentences.  She has not been getting lost.  She has not been repeating stories or things she has said other than repeatedly asking questions.  She denies bowel or bladder  problems.  Medical History:  No intracranial infections, head injuries, stroke, ?seizure in 1978 Past Medical History  Diagnosis Date  . MELANOMA, HX OF 08/04/2007  . ALLERGIC RHINITIS 06/23/2007  . ANXIETY DEPRESSION 06/12/2009  . ANXIETY 06/23/2007  . 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 GERD 06/23/2007  . HYPERLIPIDEMIA 06/24/2007  . HYPERTENSION 06/23/2007  . COLONIC POLYPS, HX OF 03/08/2002  . DIVERTICULOSIS, COLON 08/04/2007  . Irritable bowel syndrome 06/23/2007  . Memory dysfunction 06/23/2011     Past Surgical History  Procedure Date  . Cesarean section     x 2  . Colonoscopy     History   Social History  . Marital Status: Married    Spouse Name: [name]    Number of Children: [name]  . Years of Education: [name]   Social History Main Topics  . Smoking status: Never Smoker   . Smokeless tobacco: Never Used  . Alcohol Use: None  . Drug Use: None  . Sexually Active: None   Other Topics Concern  . None   Social History Narrative  . None    Family History: No neurologic disease.  ROS:  13 systems were reviewed and were significant for 50 lbs weight loss over 6 months.  No rigidity or stiffness. No dysphagia or dysarthria.  No incontinence. No dizziness when she stands up.  Other ROS were unremarkable.  Exam: Filed Vitals:   06/29/11 0911  BP: 88/58  Pulse: 88  Weight: 129 lb (58.514 kg)   Gen:  small women who has a dropped head.  Cardiovascular: The patient has a regular rate and rhythm and no carotid bruits.  Fundoscopy:  Disks are flat. Vessel caliber within normal limits.  Mental status:   Folstein 20/30 1 point for date.  1 point for floor.  2/5 for World backwards.  0/3 three word recall.  Command following 0/3.  Cranial Nerves: Pupils are equally round and reactive to light. Visual fields full to confrontation. Extraocular movements are intact without nystagmus. Facial sensation and muscles of mastication are intact. Muscles of facial  expression are symmetric.  Decreased blink rate Hearing intact to bilateral finger rub. Tongue protrusion, uvula, palate midline.  Shoulder shrug intact.  Antercollis of neck.  Motor:  The patient has normal bulk but mild cogwheel rigidity in left greater than right arm, no pronator drift and 5/5 strength bilaterally.  There are no adventitious movements, no rest tremor.  Bradykinesia left greater than right.  Reflexes:  Are 3+ bilaterally in both the upper and lower extremities.   Right toe up, left toe down.  Coordination:  Normal finger to nose.  No dysdiadokinesia.  Sensation is intact to temperature and vibration.  Gait and Station are stooped, shuffling, slightly wide based.  Turns en bloc.  Decreased arm swing.  Impression:  Parkinsonism with anterocollis(cervical dystonia).  Concern for Parkinson's plus syndrome such as multiple system atrophy or Lewy Body Dementia.  I think PSP is less likely given lack of eye findings and CBD since there is a lack of parietal signs.  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  A diagnosis like MSA or LBD would unify these symptoms.  However, it is possible that her Parkinsonism is secondary to some of the medications she was tried on for her depression but there is not a lot of evidence her for long term exposure and it sounds like her shuffling gait preceded her trial of psychotropic medications so I think this is less likely.  Recommendations: 1.  Parkinsonism - I am going to get an MRI of her brain, in particular to look for changes associated with MSA.  Based on these results we will probably proceed with a trial of levodopa to try to treat the motor symptoms(shuffling gait) as well as as an cholinesterase inhibitor for the cognitive symptoms.  I ave asked her to stop gabapentin for now as  I am not sure what purpose this is serving. 2.  Anterocollis - We will have to be mindful that this may worsen with the use of levodopa or cholinesterase inhibitor.  We may need to send her for chemodenervation.  In the meantime I am going to get the notes from her orthopedist including the results of the MRI.    I will see the patient back in 6 weeks after her MRI.  Lupita Raider Modesto Charon, MD [facility] Neurology, [place]

## 2011-06-30 ENCOUNTER — Ambulatory Visit (HOSPITAL_COMMUNITY)
Admission: RE | Admit: 2011-06-30 | Discharge: 2011-06-30 | Disposition: A | Discharge status: Home / Self Care | Payer: 59 | Source: Ambulatory Visit | Attending: Neurology | Admitting: Neurology

## 2011-06-30 DIAGNOSIS — F29 Unspecified psychosis not due to a substance or known physiological condition: Secondary | ICD-10-CM | POA: Insufficient documentation

## 2011-06-30 DIAGNOSIS — R413 Other amnesia: Secondary | ICD-10-CM | POA: Insufficient documentation

## 2011-06-30 DIAGNOSIS — F3289 Other specified depressive episodes: Secondary | ICD-10-CM | POA: Insufficient documentation

## 2011-06-30 DIAGNOSIS — I10 Essential (primary) hypertension: Secondary | ICD-10-CM | POA: Insufficient documentation

## 2011-06-30 DIAGNOSIS — R269 Unspecified abnormalities of gait and mobility: Secondary | ICD-10-CM | POA: Insufficient documentation

## 2011-06-30 DIAGNOSIS — F329 Major depressive disorder, single episode, unspecified: Secondary | ICD-10-CM | POA: Insufficient documentation

## 2011-07-02 ENCOUNTER — Ambulatory Visit: Payer: 59 | Admitting: Neurology

## 2011-07-11 NOTE — Progress Notes (Signed)
Reviewed MRI C-spine report from [facility].  Mild degeneration.  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  Report send to be scanned.

## 2011-08-05 ENCOUNTER — Encounter: Payer: Self-pay | Admitting: Neurology

## 2011-08-05 ENCOUNTER — Ambulatory Visit (INDEPENDENT_AMBULATORY_CARE_PROVIDER_SITE_OTHER): Payer: 59 | Admitting: Neurology

## 2011-08-05 DIAGNOSIS — R413 Other amnesia: Secondary | ICD-10-CM

## 2011-08-05 NOTE — Progress Notes (Signed)
Dear Dr. Jonny Ruiz,  I saw  Tara Shaffer back in [facility] for her problem with gait problems and memory problems.  As you may recall, she is a 65 y.o. year old female with a history of a recent diagnosis of depression 2 years ago that was treated with various anti-depressants including Abilify.  When I first saw her I was worried about parkinsonism and dementia with the depression being an initial manifestation of dementia.  She was also noted to have anterocollis.  Since I last saw her, they say she has gotten remarkably better.  She has been off her anti-depressants for quite some time, but they cannot give me the exact dates.  She is still having memory difficulties.   Medical history, social history, family history and allergies were reviewed and have not changed since the last clinic vist.  Current Outpatient Prescriptions on File Prior to Visit  Medication Sig Dispense Refill  . amLODipine (NORVASC) 10 MG tablet Take 10 mg by mouth daily.        Marland Kitchen aspirin 81 MG EC tablet Take 81 mg by mouth daily.        . benazepril-hydrochlorthiazide (LOTENSIN HCT) 20-12.5 MG per tablet Take 1 tablet by mouth daily.  90 tablet  3  . rosuvastatin (CRESTOR) 40 MG tablet Take 40 mg by mouth. Take 1/2 once daily          ROS:  13 systems were reviewed and are notable for improving mood.  All other review of systems are unremarkable.  Exam: . Filed Vitals:   08/05/11 1416  BP: 110/60  Pulse: 84  Weight: 142 lb (64.411 kg)    In general, flat appearing woman in NAD.    Cranial Nerves:  Visual fields full to confrontation. Extraocular movements are intact without nystagmus.Saccades are full and fast.  Muscles of facial expression are symmetric but there is a decreased blink rate.  Tongue protrusion, uvula, palate midline.  Shoulder shrug intact  Motor:  There is some mild axial rigidity with activation.  Mild fine resting tremor.  Some bradykinesia particularly in the left hand  to FFM.  Mild coghweeling left arm.  Her antercollis looks much improved.  Reflexes:  2+ thoughout, toes down.  Coordination:  Normal finger to nose  Gait:  Reduced arm swing, but good stride length. no retropulsion.  MRI brain was reviewed and did not reveal any putaminal signal change or hot cross bun sign.  Impression:  Parkinsonism, and possible dementia.  I am not sure if Ms. Decker has a pseudodementia from depression and the parkinsonism is from her previous medications or whether she has a primary underlying neurodegenerative disease that is producing her symptoms.  In any case her gait and anterocollis appears much better today.  Recommendations:  I am going to get neuropsychological testing to determine who much of her memory dysfunction is from her depression.  For now I am not going to start her on any medications, either for her memory or her movement disorder(which is improving).  We will see the patient back in 3 months.  Lupita Raider Modesto Charon, MD [facility] Neurology, [place]

## 2011-08-05 NOTE — Patient Instructions (Signed)
Your appointment for the memory testing is scheduled for Monday, December 10th at 12:30 at [facility] [address] [place], [place]  [phone] with Dr. Jacquelyne Balint.  We will see you back on 11/05/11 at 2:30pm.

## 2011-08-17 ENCOUNTER — Ambulatory Visit: Payer: 59 | Admitting: Neurology

## 2011-08-20 ENCOUNTER — Telehealth: Payer: Self-pay

## 2011-08-20 MED ORDER — ALPRAZOLAM 0.25 MG PO TABS: 0.2500 mg | ORAL_TABLET | Freq: Three times a day (TID) | ORAL | Status: DC | PRN

## 2011-08-20 NOTE — Telephone Encounter (Signed)
Pt's spouse called stating that pt has been having memory issues and she has recently become "obsessed" with alcohol.  Husband says that she wanted a glass of wine this morning and when he refused she threatened to drive herself to the liquor store. Spouse is extremely concerned and says that they "cannot continue like this all weekend".  He is requesting Rx to help calm the patient until he is able to make OV.   Pt is taking Prednisone and husband is unsure if this could be contributing to sxs?

## 2011-08-20 NOTE — Telephone Encounter (Signed)
Ok for low dose xanax prn, to f/u any worsening symptoms or concerns, dont think prednisone would be contributing

## 2011-08-20 NOTE — Telephone Encounter (Signed)
Spouse aware and will call back to sch an appt, Rx faxed to pharmacy.

## 2011-09-13 ENCOUNTER — Telehealth: Payer: Self-pay

## 2011-09-13 ENCOUNTER — Other Ambulatory Visit (INDEPENDENT_AMBULATORY_CARE_PROVIDER_SITE_OTHER): Payer: 59

## 2011-09-13 ENCOUNTER — Other Ambulatory Visit: Payer: Self-pay | Admitting: Internal Medicine

## 2011-09-13 DIAGNOSIS — Z0000 Encounter for general adult medical examination without abnormal findings: Secondary | ICD-10-CM

## 2011-09-13 LAB — BASIC METABOLIC PANEL
BUN: 7 mg/dL (ref 6–23)
CO2: 27 mEq/L (ref 19–32)
Calcium: 9.5 mg/dL (ref 8.4–10.5)
Chloride: 95 mEq/L — ABNORMAL LOW (ref 96–112)
Creatinine, Ser: 0.8 mg/dL (ref 0.4–1.2)
GFR: 75.6 mL/min (ref >60.00)
Glucose, Bld: 99 mg/dL (ref 70–99)
Potassium: 3.6 mEq/L (ref 3.5–5.1)
Sodium: 133 mEq/L — ABNORMAL LOW (ref 135–145)

## 2011-09-13 LAB — HEPATIC FUNCTION PANEL
ALT: 14 U/L (ref 0–35)
AST: 20 U/L (ref 0–37)
Albumin: 4.1 g/dL (ref 3.5–5.2)
Alkaline Phosphatase: 97 U/L (ref 39–117)
Bilirubin, Direct: 0.1 mg/dL (ref 0.0–0.3)
Total Bilirubin: 0.6 mg/dL (ref 0.3–1.2)
Total Protein: 7.4 g/dL (ref 6.0–8.3)

## 2011-09-13 LAB — CBC WITH DIFFERENTIAL/PLATELET
Basophils Absolute: 0 10*3/uL (ref 0.0–0.1)
Basophils Relative: 0.7 % (ref 0.0–3.0)
Eosinophils Absolute: 0 10*3/uL (ref 0.0–0.7)
Eosinophils Relative: 0.2 % (ref 0.0–5.0)
HCT: 39.5 % (ref 36.0–46.0)
Hemoglobin: 14 g/dL (ref 12.0–15.0)
Lymphocytes Relative: 12.8 % (ref 12.0–46.0)
Lymphs Abs: 0.8 10*3/uL (ref 0.7–4.0)
MCHC: 35.3 g/dL (ref 30.0–36.0)
MCV: 96.1 fl (ref 78.0–100.0)
Monocytes Absolute: 0.5 10*3/uL (ref 0.1–1.0)
Monocytes Relative: 8.8 % (ref 3.0–12.0)
Neutro Abs: 4.8 10*3/uL (ref 1.4–7.7)
Neutrophils Relative %: 77.5 % — ABNORMAL HIGH (ref 43.0–77.0)
Platelets: 319 10*3/uL (ref 150.0–400.0)
RBC: 4.11 Mil/uL (ref 3.87–5.11)
RDW: 14.3 % (ref 11.5–14.6)
WBC: 6.2 10*3/uL (ref 4.5–10.5)

## 2011-09-13 LAB — URINALYSIS, ROUTINE W REFLEX MICROSCOPIC
Bilirubin Urine: NEGATIVE
Hgb urine dipstick: NEGATIVE
Ketones, ur: NEGATIVE
Nitrite: NEGATIVE
Specific Gravity, Urine: 1.01 (ref 1.000–1.030)
Total Protein, Urine: NEGATIVE
Urine Glucose: NEGATIVE
Urobilinogen, UA: 0.2 (ref 0.0–1.0)
pH: 7 (ref 5.0–8.0)

## 2011-09-13 LAB — LIPID PANEL
Cholesterol: 201 mg/dL — ABNORMAL HIGH (ref 0–200)
HDL: 95.2 mg/dL (ref >39.00)
Total CHOL/HDL Ratio: 2
Triglycerides: 67 mg/dL (ref 0.0–149.0)
VLDL: 13.4 mg/dL (ref 0.0–40.0)

## 2011-09-13 LAB — TSH: TSH: 1.93 u[IU]/mL (ref 0.35–5.50)

## 2011-09-13 NOTE — Telephone Encounter (Signed)
Put order in for physical labs. 

## 2011-09-15 ENCOUNTER — Encounter: Payer: Self-pay | Admitting: Internal Medicine

## 2011-09-17 ENCOUNTER — Ambulatory Visit (INDEPENDENT_AMBULATORY_CARE_PROVIDER_SITE_OTHER): Payer: 59 | Admitting: Internal Medicine

## 2011-09-17 ENCOUNTER — Other Ambulatory Visit: Payer: Self-pay | Admitting: Internal Medicine

## 2011-09-17 VITALS — BP 120/72 | HR 88 | Temp 97.5°F | Wt 150.0 lb

## 2011-09-17 DIAGNOSIS — Z136 Encounter for screening for cardiovascular disorders: Secondary | ICD-10-CM

## 2011-09-17 DIAGNOSIS — F411 Generalized anxiety disorder: Secondary | ICD-10-CM

## 2011-09-17 DIAGNOSIS — G4700 Insomnia, unspecified: Secondary | ICD-10-CM

## 2011-09-17 DIAGNOSIS — Z0000 Encounter for general adult medical examination without abnormal findings: Secondary | ICD-10-CM

## 2011-09-17 MED ORDER — ALPRAZOLAM 0.5 MG PO TABS: 0.5000 mg | ORAL_TABLET | Freq: Two times a day (BID) | ORAL | Status: DC | PRN

## 2011-09-17 MED ORDER — ZOLPIDEM TARTRATE ER 6.25 MG PO TBCR: 6.2500 mg | EXTENDED_RELEASE_TABLET | Freq: Every evening | ORAL | Status: DC | PRN

## 2011-09-17 MED ORDER — ALPRAZOLAM 0.5 MG PO TBDP: 0.5000 mg | ORAL_TABLET | Freq: Two times a day (BID) | ORAL | Status: DC | PRN

## 2011-09-17 NOTE — Progress Notes (Signed)
Subjective:    Patient ID: Tara Shaffer, female    DOB: [birth year], 65 y.o.   MRN: [id number]  HPI  Here for wellness and f/u;  Overall doing ok;  Pt denies CP, worsening SOB, DOE, wheezing, orthopnea, PND, worsening LE edema, palpitations, dizziness or syncope.  Pt denies neurological change such as new Headache, facial or extremity weakness.  Pt denies polydipsia, polyuria, or low sugar symptoms. Pt states overall good compliance with treatment and medications, good tolerability, and trying to follow lower cholesterol diet.  Pt denies worsening depressive symptoms, suicidal ideation or panic. No fever, wt loss, night sweats, loss of appetite, or other constitutional symptoms.  Pt states good ability with ADL's, low fall risk, home safety reviewed and adequate, no significant changes in hearing or vision, and occasionally active with exercise. Has been tx for depression but let go from her job for performance, poor memory/concentration/task completeion and "compulsive obsessions" per husband who states it boils down to more ETOH use lately - now to 1 small bottle wine per day, anxiety medications seems to work and husband suggests increased med.  Has seen at [facility] 2 yrs ago, but now not depressed but fairly severe anxiety.  Has thought about taking her to [facility], but deferred to today.  Has been working with Dr Modesto Charon on memory - ? parkinsons like problem, has recent MRI. Sleep is a significant issue as well - hard to get to sleep and keeps the TV on all night, and keeps husbnad awake.  Past Medical History  Diagnosis Date  . MELANOMA, HX OF 08/04/2007  . ALLERGIC RHINITIS 06/23/2007  . ANXIETY DEPRESSION 06/12/2009  . ANXIETY 06/23/2007  . 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 GERD 06/23/2007  . HYPERLIPIDEMIA 06/24/2007  . HYPERTENSION 06/23/2007  . COLONIC POLYPS, HX OF 03/08/2002  . DIVERTICULOSIS, COLON 08/04/2007  . Irritable bowel syndrome 06/23/2007  . Memory  dysfunction 06/23/2011  . Cancer     skin cx/ hx melanoma  . LOW BACK PAIN 06/24/2007   Past Surgical History  Procedure Date  . Cesarean section     x 2  . Colonoscopy   . Appendectomy     reports that she has never smoked. She has never used smokeless tobacco. She reports that she drinks alcohol. She reports that she does not use illicit drugs. family history includes Colon cancer in her cousin; Diabetes in her other; and Hypertension in her other. Allergies  Allergen Reactions  . Codeine Phosphate     REACTION: nausea   Current Outpatient Prescriptions on File Prior to Visit  Medication Sig Dispense Refill  . amLODipine (NORVASC) 10 MG tablet Take 10 mg by mouth daily.        . ARIPiprazole (ABILIFY) 10 MG tablet Take 10 mg by mouth daily.        Marland Kitchen aspirin 81 MG EC tablet Take 81 mg by mouth daily.        . benazepril-hydrochlorthiazide (LOTENSIN HCT) 20-12.5 MG per tablet Take 1 tablet by mouth daily.  90 tablet  3  . rosuvastatin (CRESTOR) 40 MG tablet Take 40 mg by mouth. Take 1/2 once daily        Review of Systems Review of Systems  Constitutional: Negative for diaphoresis, activity change, appetite change and unexpected weight change.  HENT: Negative for hearing loss, ear pain, facial swelling, mouth sores and neck stiffness.   Eyes: Negative for pain, redness and visual disturbance.  Respiratory: Negative for shortness  of breath and wheezing.   Cardiovascular: Negative for chest pain and palpitations.  Gastrointestinal: Negative for diarrhea, blood in stool, abdominal distention and rectal pain.  Genitourinary: Negative for hematuria, flank pain and decreased urine volume.  Musculoskeletal: Negative for myalgias and joint swelling.  Skin: Negative for color change and wound.  Neurological: Negative for syncope and numbness.  Hematological: Negative for adenopathy.  Psychiatric/Behavioral: Negative for hallucinations, self-injury, decreased concentration and agitation.        Objective:   Physical Exam BP 120/72  Pulse 88  Temp(Src) 97.5 F (36.4 C) (Oral)  Wt 150 lb (68.04 kg)  SpO2 97% Physical Exam  VS noted Constitutional: Pt is oriented to person, place, and time. Appears well-developed and well-nourished.  HENT:  Head: Normocephalic and atraumatic.  Right Ear: External ear normal.  Left Ear: External ear normal.  Nose: Nose normal.  Mouth/Throat: Oropharynx is clear and moist.  Eyes: Conjunctivae and EOM are normal. Pupils are equal, round, and reactive to light.  Neck: Normal range of motion. Neck supple. No JVD present. No tracheal deviation present.  Cardiovascular: Normal rate, regular rhythm, normal heart sounds and intact distal pulses.   Pulmonary/Chest: Effort normal and breath sounds normal.  Abdominal: Soft. Bowel sounds are normal. There is no tenderness.  Musculoskeletal: Normal range of motion. Exhibits no edema.  Lymphadenopathy:  Has no cervical adenopathy.  Neurological: Pt is alert and oriented to person, place, and time. Pt has normal reflexes. No cranial nerve deficit.  Skin: Skin is warm and dry. No rash noted. Mild female pattern baldness worsening Psychiatric:  . Behavior is normal.  Not depressed affect, 1-2+ anxious    Assessment & Plan:

## 2011-09-17 NOTE — Assessment & Plan Note (Addendum)

## 2011-09-17 NOTE — Patient Instructions (Addendum)
Please remember to followup with your GYN for the yearly pap smear and/or mammogram Ok to increase the alprazolam to .5 mg three time per day as needed Take all new medications as prescribed - the ambien at night for sleep Please keep your appointments with your specialists as you have planned - Dr Modesto Charon Please return in 6 months, or sooner if needed

## 2011-09-18 ENCOUNTER — Encounter: Payer: Self-pay | Admitting: Internal Medicine

## 2011-09-18 NOTE — Assessment & Plan Note (Addendum)
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

## 2011-09-18 NOTE — Assessment & Plan Note (Signed)
Mild worsening, for ambien prn,  to f/u any worsening symptoms or concerns

## 2011-09-20 ENCOUNTER — Other Ambulatory Visit: Payer: Self-pay

## 2011-09-20 NOTE — Telephone Encounter (Signed)
Informed the patient of MD's instructions

## 2011-09-20 NOTE — Telephone Encounter (Signed)
Patient informed. 

## 2011-09-20 NOTE — Telephone Encounter (Signed)
Please call husband; this was just given hardcopy at her last OV just a few days ago

## 2011-10-06 ENCOUNTER — Telehealth: Payer: Self-pay | Admitting: Neurology

## 2011-10-06 ENCOUNTER — Telehealth: Payer: Self-pay

## 2011-10-06 MED ORDER — ALPRAZOLAM 0.5 MG PO TABS: 0.5000 mg | ORAL_TABLET | Freq: Three times a day (TID) | ORAL | Status: DC | PRN

## 2011-10-06 MED ORDER — ESCITALOPRAM OXALATE 10 MG PO TABS: 10.0000 mg | ORAL_TABLET | Freq: Every day | ORAL | Status: DC

## 2011-10-06 NOTE — Telephone Encounter (Signed)
Called the patient and she is taking it three times per day

## 2011-10-06 NOTE — Telephone Encounter (Signed)
Please clarify, is she now taking twice or three times per day xanax?

## 2011-10-06 NOTE — Telephone Encounter (Signed)
Ok to add lexapro 10 qd 

## 2011-10-06 NOTE — Telephone Encounter (Signed)
Pt called stating whole body shaking is worse today. Pt denied any other sxs, and is requesting adjustment of Xanax, please advise.

## 2011-10-06 NOTE — Telephone Encounter (Signed)
Patient informed. 

## 2011-10-06 NOTE — Telephone Encounter (Signed)
Called the patient left message to call back 

## 2011-10-06 NOTE — Telephone Encounter (Signed)
Pt states that Xanax is not working. She is "shaking like a leaf." Please advise. Return call to husband's cell phone number.

## 2011-10-06 NOTE — Telephone Encounter (Signed)
Called and spoke with the patient. She states she is shaking and crying a lot today. I asked how long this has been going on and patient stated, "everyday but just worse today." She does not know what triggered the increase in her anxiety. She is taking Alprazolam TID as prescribed by Dr. Jonny Ruiz. I recommended she get in touch with Dr. Raphael Gibney office as they were the prescribing office. She also sees a psychiatrist/psychologist and I suggested that that may be another option for her as well. She states she has had her neuropsychological testing with Dr. Jacquelyne Balint and is getting those results tomorrow. (I could hear her husband talking in the background). I told her that I would let Dr. Modesto Charon know she was having some problems and if I had anything to report I would call her back. I suggested that she call her PCP or her psych MD. The patient agreed with this plan. **I then received a call from [facility] at her PCP's office. We discussed the patient's phone call and her situation. She stated she would call the patient back to discuss. I told her that I would let Dr. Modesto Charon know as well. **Dr. Modesto Charon....FYI.  Jan

## 2011-10-11 ENCOUNTER — Telehealth: Payer: Self-pay

## 2011-10-11 MED ORDER — ALPRAZOLAM 0.5 MG PO TABS: 0.5000 mg | ORAL_TABLET | Freq: Three times a day (TID) | ORAL | Status: AC | PRN

## 2011-10-11 NOTE — Telephone Encounter (Signed)
The patient states that [facility] did not received Alprazolam 0.5 mg sent on 10/06/11. Called the pharmacy and they confirmed as well they did not receive Alprazolam 0.5 mg but did get the Lexapro on 10/06/11. The last refill they have for Alprazolam was on 09/08/11 ( 0.25 mg) #60. Please advise.

## 2011-10-11 NOTE — Telephone Encounter (Signed)
I beieve the rx was given hardcopy to pt at time of visit, but wil re-do  Done hardcopy to robin

## 2011-10-11 NOTE — Telephone Encounter (Signed)
Faxed hardcopy to pharmacy, called patient to inform was no answer will call back in the AM

## 2011-10-12 NOTE — Telephone Encounter (Signed)
Called to inform prescription sent to pharmacy. No answer and no other phone number in chart.

## 2011-11-05 ENCOUNTER — Ambulatory Visit (INDEPENDENT_AMBULATORY_CARE_PROVIDER_SITE_OTHER): Payer: 59 | Admitting: Neurology

## 2011-11-05 ENCOUNTER — Encounter: Payer: Self-pay | Admitting: Neurology

## 2011-11-05 DIAGNOSIS — R413 Other amnesia: Secondary | ICD-10-CM

## 2011-11-05 DIAGNOSIS — F411 Generalized anxiety disorder: Secondary | ICD-10-CM

## 2011-11-05 NOTE — Progress Notes (Signed)
Dear Dr. Jonny Ruiz,  I saw  Tara Shaffer back in [facility] for her problem with memory.  As you may recall, she is a 66 y.o. year old female with a history of depression and anxiety who initially presented to me for memory complaints.  When I first met her I was concerned that she may have a primary neurodegenerative disorder as she presented with anterocollis, shuffling gait and memory dysfunction as well as late onset depression.  However, at the next appointment her shuffling gait and anterocollis had resolved presumedly from stopping some of her psychiatric meds.  She still as having problems with memory and anxiety and depression when I last saw her.  I sent her for memory testing and Dr. Jacquelyne Balint felt that she likely had memory loss secondary to her depression and anxiety.  Since I last saw her she has still had significant problems with anxiety but her depression is better.  Her husband says she is scared to be alone.  I did not realize it but for the last several months she has been drinking upwards of 1.5L of bottle a wine a day.  This is a new behavior for her.  She has tried to decrease her alcohol use and is not using it as late a night so she is sleeping better.  They are trying to reduce her intake to a day.  She is still taking alprazolam 3 times a day and if she misses a dose gets very anxious.  You recently started her on Lexapro 10mg  and she feels better on it.    Medical history, social history, and family history were reviewed and have not changed since the last clinic visit.  Current Outpatient Prescriptions on File Prior to Visit  Medication Sig Dispense Refill  . ALPRAZolam (XANAX) 0.5 MG tablet Take 1 tablet (0.5 mg total) by mouth 3 (three) times daily as needed for sleep.  90 tablet  2  . amLODipine (NORVASC) 10 MG tablet Take 10 mg by mouth daily.        .        . aspirin 81 MG EC tablet Take 81 mg by mouth daily.        .  benazepril-hydrochlorthiazide (LOTENSIN HCT) 20-12.5 MG per tablet Take 1 tablet by mouth daily.  90 tablet  3  . escitalopram (LEXAPRO) 10 MG tablet Take 1 tablet (10 mg total) by mouth daily.  30 tablet  11  . rosuvastatin (CRESTOR) 40 MG tablet Take 40 mg by mouth. Take 1/2 once daily         Allergies  Allergen Reactions  . Codeine Phosphate     REACTION: nausea    ROS:  13 systems were reviewed and are notable for alopecia.  All other review of systems are unremarkable.  Exam: . Filed Vitals:   11/05/11 1426  BP: 158/88  Pulse: 72  Weight: 147 lb (66.679 kg)    In general, dysphoric appearing women.   Cranial Nerves: Pupils are equally round and reactive to light. Extraocular movements are intact without nystagmus. Facial sensation and muscles of mastication are intact. Muscles of facial expression are symmetric. Hearing intact to bilateral finger rub. Tongue protrusion, uvula, palate midline.  Shoulder shrug intact  Blunted affect.  Motor:  Mild cogwheeling bilaterally, mild postural tremor bilaterally. Full strength.  Reflexes:  2+ thoughout, toes down.  Coordination:  Normal finger to nose  Gait:  Normal gait and station.    Impression/Recommendations:  1.  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 2.  Gait abnormality and anterocollis are resolved 3.  Anxiety and depression - Agree with the addition of Lexapro.  She will probably need a higher dose.  Also, alprazolam may be too short acting and she could benefit from a change to clonazepam but I will leave this up to you.  She will likely need psychiatric consultation again - she has gotten a psychiatrist's name from Dr. Jacquelyne Balint and they are willing to try that.  I will see her back in 4 months.  I am still suspicious of an underlying neurodegenerative process, so I would like to see her at least one more time.   Tara Raider Modesto Charon, MD [facility] Neurology,  [place]

## 2011-11-05 NOTE — Patient Instructions (Signed)
Follow up with Dr. Wong in 4 months. 

## 2011-11-29 ENCOUNTER — Telehealth: Payer: Self-pay

## 2011-11-29 MED ORDER — CITALOPRAM HYDROBROMIDE 40 MG PO TABS: 40.0000 mg | ORAL_TABLET | Freq: Every day | ORAL | Status: DC

## 2011-11-29 NOTE — Telephone Encounter (Signed)
Ok to change to citalopram 40 - done escript

## 2011-11-29 NOTE — Telephone Encounter (Signed)
Patients plan does not cover Escitalopram 20 mg please advise initiate PA or change medication

## 2011-11-29 NOTE — Telephone Encounter (Signed)
Patient informed. 

## 2012-02-02 ENCOUNTER — Other Ambulatory Visit: Payer: Self-pay

## 2012-02-02 MED ORDER — CLONAZEPAM 0.5 MG PO TABS: 0.5000 mg | ORAL_TABLET | Freq: Two times a day (BID) | ORAL | Status: DC | PRN

## 2012-02-02 NOTE — Telephone Encounter (Signed)
Pharmacy requesting refill on Alprazolam 0.5 mg tid prn

## 2012-02-02 NOTE — Telephone Encounter (Signed)
Called the patient informed of change and faxed to pharmacy.

## 2012-02-02 NOTE — Telephone Encounter (Signed)
To change to klonopin .5 mg bid prn as Dr Modesto Charon suggested this would likely work better for her  Done hardcopy to [employer]

## 2012-02-14 ENCOUNTER — Other Ambulatory Visit: Payer: Self-pay | Admitting: Internal Medicine

## 2012-02-17 ENCOUNTER — Telehealth: Payer: Self-pay

## 2012-02-17 NOTE — Telephone Encounter (Signed)
Pt called stating that Klonopin is not controlling her tremor as well as before. Pt is requesting an increase/adjustment to medication. Pt advised that MD out of office but is requesting another provider review her request. Please advise on this JWJ pt, thanks!

## 2012-02-17 NOTE — Telephone Encounter (Signed)
May increase current dose clonazepam (0.5) to tid prn - pt to use current supply and follow up JWJ re: new rx (or med change) depending on response to TID

## 2012-02-18 NOTE — Telephone Encounter (Signed)
Pt states that she already takes medication TID. Pt made appt to discuss further with JWJ.

## 2012-02-23 ENCOUNTER — Encounter: Payer: Self-pay | Admitting: Internal Medicine

## 2012-02-23 ENCOUNTER — Ambulatory Visit (INDEPENDENT_AMBULATORY_CARE_PROVIDER_SITE_OTHER): Payer: 59 | Admitting: Internal Medicine

## 2012-02-23 VITALS — BP 140/90 | HR 75 | Temp 97.0°F | Ht 64.0 in | Wt 156.5 lb

## 2012-02-23 DIAGNOSIS — E785 Hyperlipidemia, unspecified: Secondary | ICD-10-CM

## 2012-02-23 DIAGNOSIS — I10 Essential (primary) hypertension: Secondary | ICD-10-CM

## 2012-02-23 DIAGNOSIS — F1010 Alcohol abuse, uncomplicated: Secondary | ICD-10-CM

## 2012-02-23 DIAGNOSIS — F341 Dysthymic disorder: Secondary | ICD-10-CM

## 2012-02-23 DIAGNOSIS — R413 Other amnesia: Secondary | ICD-10-CM

## 2012-02-23 NOTE — Patient Instructions (Signed)
Continue all other medications as before Please have the pharmacy call with any refills you may need.  

## 2012-02-27 ENCOUNTER — Encounter: Payer: Self-pay | Admitting: Internal Medicine

## 2012-02-27 DIAGNOSIS — F1010 Alcohol abuse, uncomplicated: Secondary | ICD-10-CM

## 2012-02-27 HISTORY — DX: Alcohol abuse, uncomplicated: F10.10

## 2012-02-27 NOTE — Progress Notes (Signed)
Subjective:    Patient ID: Tara Shaffer, female    DOB: 1946/02/14, 66 y.o.   MRN: [id number]  HPI  Here with husband who wants to be supportive but admits she is not taking her BP and chol meds;  Overall doing ok, but he admits to helping her ETOH intake- current ave about 1000 cc per day, but he plans to start weaning, but does not have a plan, and doesn't seem committed at this time.  Pt has seen Dr Lugo/psychiatry but no longer, also has seen Dr McDermott/Phd with f/u planned 1 yr.  Husband plans to call for new psychiatry but has been meaning to do this for 2 months.  Klonopin .5 only good for about 2 hrs as far as anxiety and tremulousness, but does not want more for her.  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  Declines suggestion of PT Past Medical History  Diagnosis Date  . MELANOMA, HX OF 08/04/2007  . ALLERGIC RHINITIS 06/23/2007  . ANXIETY DEPRESSION 06/12/2009  . ANXIETY 06/23/2007  . 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 GERD 06/23/2007  . HYPERLIPIDEMIA 06/24/2007  . HYPERTENSION 06/23/2007  . COLONIC POLYPS, HX OF 03/08/2002  . DIVERTICULOSIS, COLON 08/04/2007  . Irritable bowel syndrome 06/23/2007  . Memory dysfunction 06/23/2011  . Cancer     skin cx/ hx melanoma  . LOW BACK PAIN 06/24/2007   Past Surgical History  Procedure Date  . Cesarean section     x 2  . Colonoscopy   . Appendectomy     reports that she has never smoked. She has never used smokeless tobacco. She reports that she drinks alcohol. She reports that she does not use illicit drugs. family history includes Colon cancer in her cousin; Diabetes in her other; and Hypertension in her other. Allergies  Allergen Reactions  . Codeine Phosphate     REACTION: nausea   Current Outpatient Prescriptions  on File Prior to Visit  Medication Sig Dispense Refill  . citalopram (CELEXA) 40 MG tablet Take 1 tablet (40 mg total) by mouth daily.  90 tablet  3  . clonazePAM (KLONOPIN) 0.5 MG tablet Take 1 tablet (0.5 mg total) by mouth 2 (two) times daily as needed for anxiety.  60 tablet  2  . amLODipine (NORVASC) 10 MG tablet Take 10 mg by mouth daily.        Marland Kitchen aspirin 81 MG EC tablet Take 81 mg by mouth daily.        . benazepril-hydrochlorthiazide (LOTENSIN HCT) 20-12.5 MG per tablet Take 1 tablet by mouth daily.  90 tablet  3  . rosuvastatin (CRESTOR) 40 MG tablet Take 40 mg by mouth. Take 1/2 once daily       . zolpidem (AMBIEN CR) 6.25 MG CR tablet Take 1 tablet (6.25 mg total) by mouth at bedtime as needed for sleep.  30 tablet  5   Review of Systems  Constitutional: Negative for diaphoresis and unexpected weight change.  HENT: Negative for drooling and tinnitus.   Eyes: Negative for photophobia and visual disturbance.  Respiratory: Negative for choking and stridor.   Gastrointestinal: Negative for vomiting and blood in stool.  Genitourinary: Negative for hematuria and decreased urine volume.  Musculoskeletal: Negative for joint pain Skin: Negative for color change and wound.  Neurological:  Negative for  numbness.      Objective:   Physical Exam BP 140/90  Pulse 75  Temp(Src) 97 F (36.1 C) (Oral)  Ht 5\' 4"  (1.626 m)  Wt 156 lb 8 oz (70.988 kg)  BMI 26.86 kg/m2  SpO2 94% Physical Exam  VS noted Constitutional: Pt appears well-developed and well-nourished.  HENT: Head: Normocephalic.  Right Ear: External ear normal.  Left Ear: External ear normal.  Eyes: Conjunctivae and EOM are normal. Pupils are equal, round, and reactive to light.  Neck: Normal range of motion. Neck supple.  Cardiovascular: Normal rate and regular rhythm.   Pulmonary/Chest: Effort normal and breath sounds normal.  Abd:  Soft, NT, non-distended, + BS Neurological: Pt is alert. Not confused  Skin: Skin is  warm. No erythema.  Psych: 1+ nervous    Assessment & Plan:

## 2012-02-27 NOTE — Assessment & Plan Note (Signed)
Pt absolutely needs to begin weaning with help of husband, I encouraged a definite regiment of reduction by 250 cc every 3-5 days until off

## 2012-02-27 NOTE — Assessment & Plan Note (Signed)
Ok to cont meds as is for now,  Encouraged to call today for new psychiatrist as he has been planning

## 2012-02-27 NOTE — Assessment & Plan Note (Signed)
To re-start med, encouraged compliance with med with husband support BP Readings from Last 3 Encounters:  02/23/12 140/90  11/05/11 158/88  09/17/11 120/72

## 2012-02-27 NOTE — Assessment & Plan Note (Signed)
Encouraged compliacne with husband support, for lower chol diet,  Lab Results  Component Value Date   LDLCALC 64 08/12/2010  for labs next visit

## 2012-02-27 NOTE — Assessment & Plan Note (Signed)
Agree likely neurodegnerative process,  to f/u any worsening symptoms or concerns

## 2012-03-06 ENCOUNTER — Ambulatory Visit (INDEPENDENT_AMBULATORY_CARE_PROVIDER_SITE_OTHER): Payer: 59 | Admitting: Neurology

## 2012-03-06 ENCOUNTER — Encounter: Payer: Self-pay | Admitting: Neurology

## 2012-03-06 VITALS — BP 152/90 | HR 88 | Wt 155.0 lb

## 2012-03-06 DIAGNOSIS — R413 Other amnesia: Secondary | ICD-10-CM

## 2012-03-06 NOTE — Progress Notes (Signed)
Dear Dr. Jonny Ruiz,   I saw Tara Shaffer back in [facility] for her problem with memory. As you may recall, she is a 66 y.o. year old female with a history of depression and anxiety who initially presented to me for memory complaints. When I first met her I was concerned that she may have a primary neurodegenerative disorder as she presented with anterocollis, shuffling gait and memory dysfunction as well as late onset depression. However, at the next appointment her shuffling gait and anterocollis had resolved presumedly from stopping some of her psychiatric meds.    Since I last saw her her husband says that she has not had a significant change in her memory.  She continues to drink excessively.  She continues to be anxious about leaving the house.  She denies hallucinations.  It appears that you switched her to clonazepam and citalopram for her anxiety and depression.  At her last appointment we discussed her seeing a psychiatrist -- she has not done this yet.  Interestingly it turns out that she probably has had a long history of anxiety - she used to insist on sleeping with her children even in their early teen years as she feared being alone.  She also used alcohol, 2-3 drinks per day when she was working.  Her husband also says her gait has gotten slightly worse since we saw her last.  She has had no falls.  She continues to be socially isolated.  She does not engage in any of her hobbies that she once did which include crafts.    Medical history, social history, and family history were reviewed and have not changed since the last clinic visit.  Current Outpatient Prescriptions on File Prior to Visit  Medication Sig Dispense Refill  . amLODipine (NORVASC) 10 MG tablet Take 10 mg by mouth daily.        Marland Kitchen aspirin 81 MG EC tablet Take 81 mg by mouth daily.        . benazepril-hydrochlorthiazide (LOTENSIN HCT) 20-12.5 MG per tablet Take 1 tablet by mouth daily.  90 tablet  3   . citalopram (CELEXA) 40 MG tablet Take 1 tablet (40 mg total) by mouth daily.  90 tablet  3  . clonazePAM (KLONOPIN) 0.5 MG tablet Take 1 tablet (0.5 mg total) by mouth 2 (two) times daily as needed for anxiety.  60 tablet  2  . gabapentin (NEURONTIN) 300 MG capsule Take 300 mg by mouth. One by mouth nightly      . rosuvastatin (CRESTOR) 40 MG tablet Take 40 mg by mouth. Take 1/2 once daily       . zolpidem (AMBIEN CR) 6.25 MG CR tablet Take 6.25 mg by mouth at bedtime as needed.      . zolpidem (AMBIEN CR) 6.25 MG CR tablet Take 1 tablet (6.25 mg total) by mouth at bedtime as needed for sleep.  30 tablet  5    Allergies  Allergen Reactions  . Codeine Phosphate     REACTION: nausea    ROS:  13 systems were reviewed and are notable for no urinary incontinence.  All other review of systems are unremarkable.  Exam: . Filed Vitals:   03/06/12 1424  BP: 152/90  Pulse: 88  Weight: 155 lb (70.308 kg)    In general, slightly unkempt women.  Mental status:   3/5 time; 4/5 place; 0/3 recall; otherwise full points for remainder MMSE 21/27.  Clock drawing intact. Cranial Nerves:  Pupils are equally round and reactive to light. Extraocular movements are intact without nystagmus. Facial sensation and muscles of mastication are intact. Muscles of facial expression are symmetric. Hearing intact to bilateral finger rub. Tongue protrusion, uvula, palate midline. Shoulder shrug intact Blunted affect.  Motor: Mild cogwheeling bilaterally, mild postural tremor bilaterally. Full strength.  Reflexes: 2+ thoughout, toes down.  Coordination: Normal finger to nose   Gait:  Wide based, slightly shuffling.  Romberg negative.  Impression/Recommendations:  1.  Memory disorder - I really am uncertain whether she has an underlying dementia, or whether her memory disorder is primarily due to psychiatric disease.  At this time, I still think her anxiety is poorly treated and treating this is the key to  improving her functionality, as well as getting her to use less alcohol.  It is going to help her unless her alcohol use improves, especially since this is likely also making her memory worse.  I have recommended that they seek psychiatric consultation for further management of her medication for anxiety. 2.  Gait abnormality - While her gait is no where near as impaired as it used to be it clearly is impaired.  This could be due to her alcohol use.  We will continue to monitor this.  We will see the patient back in 3 months.  Lupita Raider Modesto Charon, MD [facility], [place]

## 2012-03-16 ENCOUNTER — Ambulatory Visit (INDEPENDENT_AMBULATORY_CARE_PROVIDER_SITE_OTHER): Payer: 59 | Admitting: Internal Medicine

## 2012-03-16 ENCOUNTER — Encounter: Payer: Self-pay | Admitting: Internal Medicine

## 2012-03-16 VITALS — BP 152/88 | HR 86 | Temp 97.8°F | Ht 66.0 in | Wt 155.5 lb

## 2012-03-16 DIAGNOSIS — R109 Unspecified abdominal pain: Secondary | ICD-10-CM

## 2012-03-16 DIAGNOSIS — F1010 Alcohol abuse, uncomplicated: Secondary | ICD-10-CM

## 2012-03-16 DIAGNOSIS — I10 Essential (primary) hypertension: Secondary | ICD-10-CM

## 2012-03-16 DIAGNOSIS — F411 Generalized anxiety disorder: Secondary | ICD-10-CM

## 2012-03-16 NOTE — Assessment & Plan Note (Signed)
stable overall by hx and exam - likely elev related to ETOH, most recent data reviewed with pt, and pt to continue medical treatment as before BP Readings from Last 3 Encounters:  03/16/12 152/88  03/06/12 152/90  02/23/12 140/90

## 2012-03-16 NOTE — Assessment & Plan Note (Signed)
Encourage husband to start active weaning her off etoh as previously discussed, to follow for DT's/withdrawal symptoms

## 2012-03-16 NOTE — Progress Notes (Signed)
Subjective:    Patient ID: Tara Shaffer, female    DOB: 06/25/1946, 66 y.o.   MRN: [id number]  HPI  Here with husband who admits he cont's to support her alcohol abuse and has not been weaning her down to abstinence as per last OV, also has not contacted psychiatry and asks for referral today; pt with epigastric pain mild to mod without radiation, reflux and Denies worsening reflux, dysphagia, bowel change or blood, but did have n/v x 2 episode this am, and increased anxiety today, sort of "out of control" and husband forced to bring her in today.  No delerium or shakes it seems, though she cont's to have some ongoing tremulousness.   Pt denies fever, wt loss, night sweats, loss of appetite, or other constitutional symptoms per pt and husband.  Denies worsening depressive symptoms, suicidal ideation, or panic Past Medical History  Diagnosis Date  . MELANOMA, HX OF 08/04/2007  . ALLERGIC RHINITIS 06/23/2007  . ANXIETY DEPRESSION 06/12/2009  . ANXIETY 06/23/2007  . 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 GERD 06/23/2007  . HYPERLIPIDEMIA 06/24/2007  . HYPERTENSION 06/23/2007  . COLONIC POLYPS, HX OF 03/08/2002  . DIVERTICULOSIS, COLON 08/04/2007  . Irritable bowel syndrome 06/23/2007  . Memory dysfunction 06/23/2011  . Cancer     skin cx/ hx melanoma  . LOW BACK PAIN 06/24/2007  . Alcohol abuse 02/27/2012   Past Surgical History  Procedure Date  . Cesarean section     x 2  . Colonoscopy   . Appendectomy     reports that she has never smoked. She has never used smokeless tobacco. She reports that she drinks alcohol. She reports that she does not use illicit drugs. family history includes Colon cancer in her cousin; Diabetes in her other; and Hypertension in her other. Allergies  Allergen Reactions  . Codeine Phosphate     REACTION: nausea   Current Outpatient Prescriptions on File Prior to Visit  Medication Sig Dispense Refill  . amLODipine (NORVASC) 10 MG tablet Take 10  mg by mouth daily.        . benazepril-hydrochlorthiazide (LOTENSIN HCT) 20-12.5 MG per tablet Take 1 tablet by mouth daily.  90 tablet  3  . citalopram (CELEXA) 40 MG tablet Take 1 tablet (40 mg total) by mouth daily.  90 tablet  3  . clonazePAM (KLONOPIN) 0.5 MG tablet Take 1 tablet (0.5 mg total) by mouth 2 (two) times daily as needed for anxiety.  60 tablet  2  . gabapentin (NEURONTIN) 300 MG capsule Take 300 mg by mouth. One by mouth nightly      . rosuvastatin (CRESTOR) 40 MG tablet Take 40 mg by mouth. Take 1/2 once daily       . traMADol (ULTRAM) 50 MG tablet Take 50 mg by mouth every 6 (six) hours as needed.      . zolpidem (AMBIEN CR) 6.25 MG CR tablet Take 6.25 mg by mouth at bedtime as needed.      Marland Kitchen aspirin 81 MG EC tablet Take 81 mg by mouth daily.        Marland Kitchen zolpidem (AMBIEN CR) 6.25 MG CR tablet Take 1 tablet (6.25 mg total) by mouth at bedtime as needed for sleep.  30 tablet  5   Review of Systems Review of Systems  Constitutional: Negative for diaphoresis and unexpected weight change.  HENT: Negative for drooling and tinnitus.   Eyes: Negative for photophobia and visual disturbance.  Respiratory: Negative for choking  and stridor.   Gastrointestinal: Negative for blood in stool.  Genitourinary: Negative for hematuria and decreased urine volume.  Skin: Negative for color change and wound.  Psychiatric/Behavioral: Negative for suicidal ideation   Objective:   Physical Exam BP 152/88  Pulse 86  Temp 97.8 F (36.6 C) (Oral)  Ht 5\' 6"  (1.676 m)  Wt 155 lb 8 oz (70.534 kg)  BMI 25.10 kg/m2  SpO2 97% Physical Exam  VS noted Constitutional: Pt appears well-developed and well-nourished.  HENT: Head: Normocephalic.  Right Ear: External ear normal.  Left Ear: External ear normal.  Eyes: Conjunctivae and EOM ,are normal. Pupils are equal, round, and reactive to light.  Neck: Normal range of motion. Neck supple.  Cardiovascular: Normal rate and regular rhythm.     Pulmonary/Chest: Effort normal and breath sounds normal.  Abd:  Soft, non-distended, + BS but mod tender epigastric without guarding or rebound Neurological: Pt is alert. Motor/dtr intact Skin: Skin is warm. No erythema. No rash Psychiatric: Pt behavior is normal. Thought content normal. 1-2+ nervous    Assessment & Plan:

## 2012-03-16 NOTE — Assessment & Plan Note (Addendum)
To refer to psychiatry, Continue all other medications as before, avoid benzo due to ongoing etoh use

## 2012-03-16 NOTE — Assessment & Plan Note (Signed)
Hx difficult with her cognitive status and anxiety,  Suspect possible gastritis or even pancreatitis related to ETOH though exam bening;  For dexilant 60 bid for 1 wk, lipase, ua and h pylori added to labs for tomorrow, and refer GI - ? Needs EGD

## 2012-03-16 NOTE — Patient Instructions (Addendum)
Please take the dexilant 60 mg twice per day until you return next wk (or once per day if you get diarrhea) We will add the Lipase to your lab work for tomorrow You will be contacted regarding the referral for: psychiatry, and Gastroenterology Continue all other medications as before

## 2012-03-17 ENCOUNTER — Telehealth: Payer: Self-pay

## 2012-03-17 ENCOUNTER — Other Ambulatory Visit (INDEPENDENT_AMBULATORY_CARE_PROVIDER_SITE_OTHER): Payer: 59

## 2012-03-17 DIAGNOSIS — I10 Essential (primary) hypertension: Secondary | ICD-10-CM

## 2012-03-17 DIAGNOSIS — R109 Unspecified abdominal pain: Secondary | ICD-10-CM

## 2012-03-17 DIAGNOSIS — E785 Hyperlipidemia, unspecified: Secondary | ICD-10-CM

## 2012-03-17 LAB — BASIC METABOLIC PANEL
BUN: 6 mg/dL (ref 6–23)
Chloride: 90 mEq/L — ABNORMAL LOW (ref 96–112)
GFR: 82.49 mL/min (ref >60.00)
Glucose, Bld: 112 mg/dL — ABNORMAL HIGH (ref 70–99)
Potassium: 2.8 mEq/L — CL (ref 3.5–5.1)
Sodium: 132 mEq/L — ABNORMAL LOW (ref 135–145)

## 2012-03-17 LAB — URINALYSIS, ROUTINE W REFLEX MICROSCOPIC
Specific Gravity, Urine: 1.02 (ref 1.000–1.030)
Urine Glucose: NEGATIVE
Urobilinogen, UA: 0.2 (ref 0.0–1.0)
pH: 6 (ref 5.0–8.0)

## 2012-03-17 LAB — CBC WITH DIFFERENTIAL/PLATELET
Basophils Absolute: 0 10*3/uL (ref 0.0–0.1)
HCT: 44.5 % (ref 36.0–46.0)
Lymphs Abs: 0.6 10*3/uL — ABNORMAL LOW (ref 0.7–4.0)
MCV: 98.6 fl (ref 78.0–100.0)
Monocytes Absolute: 0.6 10*3/uL (ref 0.1–1.0)
Neutrophils Relative %: 80.5 % — ABNORMAL HIGH (ref 43.0–77.0)
Platelets: 218 10*3/uL (ref 150.0–400.0)
RDW: 14.2 % (ref 11.5–14.6)

## 2012-03-17 LAB — LIPID PANEL: Cholesterol: 201 mg/dL — ABNORMAL HIGH (ref 0–200)

## 2012-03-17 LAB — HEPATIC FUNCTION PANEL
ALT: 28 U/L (ref 0–35)
AST: 35 U/L (ref 0–37)
Albumin: 4.3 g/dL (ref 3.5–5.2)
Alkaline Phosphatase: 111 U/L (ref 39–117)
Total Bilirubin: 1.3 mg/dL — ABNORMAL HIGH (ref 0.3–1.2)

## 2012-03-17 LAB — TSH: TSH: 2.15 u[IU]/mL (ref 0.35–5.50)

## 2012-03-17 MED ORDER — CEPHALEXIN 500 MG PO CAPS: 500.0000 mg | ORAL_CAPSULE | Freq: Four times a day (QID) | ORAL | Status: AC

## 2012-03-17 MED ORDER — POTASSIUM CHLORIDE ER 10 MEQ PO TBCR: 10.0000 meq | EXTENDED_RELEASE_TABLET | Freq: Two times a day (BID) | ORAL | Status: DC

## 2012-03-17 NOTE — Telephone Encounter (Signed)
Called informed the patients husband of both UTI and antibiotic as well as klor con both sent to pharmacy.

## 2012-03-17 NOTE — Telephone Encounter (Signed)
Called left message to call back 

## 2012-03-17 NOTE — Telephone Encounter (Signed)
Pt with recent vomiting on lotensin HCT  To start Klor con 10 qd  Zella Ball to notify husband/pt

## 2012-03-17 NOTE — Telephone Encounter (Signed)
Called the patient left message to call back 

## 2012-03-17 NOTE — Telephone Encounter (Signed)
prob uti by hx and ua -  For antibx course  [name] to notify husband/pt

## 2012-03-17 NOTE — Telephone Encounter (Signed)
Critical lab, Potassium 2.8

## 2012-03-21 ENCOUNTER — Ambulatory Visit (INDEPENDENT_AMBULATORY_CARE_PROVIDER_SITE_OTHER): Payer: 59 | Admitting: Internal Medicine

## 2012-03-21 ENCOUNTER — Encounter: Payer: Self-pay | Admitting: Internal Medicine

## 2012-03-21 VITALS — BP 122/70 | HR 82 | Temp 97.4°F | Ht 64.0 in | Wt 152.2 lb

## 2012-03-21 DIAGNOSIS — Z0000 Encounter for general adult medical examination without abnormal findings: Secondary | ICD-10-CM

## 2012-03-21 DIAGNOSIS — I10 Essential (primary) hypertension: Secondary | ICD-10-CM

## 2012-03-21 DIAGNOSIS — R109 Unspecified abdominal pain: Secondary | ICD-10-CM

## 2012-03-21 DIAGNOSIS — E876 Hypokalemia: Secondary | ICD-10-CM

## 2012-03-21 DIAGNOSIS — R233 Spontaneous ecchymoses: Secondary | ICD-10-CM

## 2012-03-21 MED ORDER — DEXLANSOPRAZOLE 60 MG PO CPDR: 60.0000 mg | DELAYED_RELEASE_CAPSULE | Freq: Every day | ORAL | Status: DC

## 2012-03-21 NOTE — Patient Instructions (Addendum)
Ok to decrease the dexilant to 60 mg in the AM only, and finish the antibiotics We will cancel the GI referral Please go to LAB in the Basement for the blood and/or urine tests to be done today - to re-check the potassium Please keep your appointments with your specialists as you have planned Please call if you would like referral for outpatient physical therapy to avoid falls Please keep your appointments with your specialists as you have planned - Dr Modesto Charon, and psychiatry Please return in 6 mo with Lab testing done 3-5 days before, or sooner if needed

## 2012-03-21 NOTE — Assessment & Plan Note (Signed)
?   Due to vomiitng last visit, now on K suppl, for re-check

## 2012-03-21 NOTE — Assessment & Plan Note (Signed)
Resolved, to cont the dexilant at 60 qd, finish antibx for ? UTI, ok to hold on GI referral for now

## 2012-03-21 NOTE — Assessment & Plan Note (Signed)
bilat arms marked today, doubt abuse at home, plt count normal last wk, not on antiplt or anticoag, consider INR next visit (declines today), may be more related to gait problem but declines futher eval or PT eval at this time

## 2012-03-21 NOTE — Progress Notes (Signed)
Subjective:    Patient ID: Tara Shaffer, female    DOB: 04/24/1946, 66 y.o.   MRN: [id number]  HPI  Here to f/u; overall much improved, denies further abd symptoms, with tx of ? UTI, low K and dexilant 60 bid trial,  Wants to hold on GI referral for now,  Denies worsening reflux, dysphagia, abd pain, n/v, bowel change or blood.  Pt denies chest pain, increased sob or doe, wheezing, orthopnea, PND, increased LE swelling, palpitations, dizziness or syncope.  Pt denies new neurological symptoms such as new headache, or facial or extremity weakness or numbness  Pt denies polydipsia, polyuria.  No new complaints Past Medical History  Diagnosis Date  . MELANOMA, HX OF 08/04/2007  . ALLERGIC RHINITIS 06/23/2007  . ANXIETY DEPRESSION 06/12/2009  . ANXIETY 06/23/2007  . 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 GERD 06/23/2007  . HYPERLIPIDEMIA 06/24/2007  . HYPERTENSION 06/23/2007  . COLONIC POLYPS, HX OF 03/08/2002  . DIVERTICULOSIS, COLON 08/04/2007  . Irritable bowel syndrome 06/23/2007  . Memory dysfunction 06/23/2011  . Cancer     skin cx/ hx melanoma  . LOW BACK PAIN 06/24/2007  . Alcohol abuse 02/27/2012   Past Surgical History  Procedure Date  . Cesarean section     x 2  . Colonoscopy   . Appendectomy     reports that she has never smoked. She has never used smokeless tobacco. She reports that she drinks alcohol. She reports that she does not use illicit drugs. family history includes Colon cancer in her cousin; Diabetes in her other; and Hypertension in her other. Allergies  Allergen Reactions  . Codeine Phosphate     REACTION: nausea   Current Outpatient Prescriptions on File Prior to Visit  Medication Sig Dispense Refill  . amLODipine (NORVASC) 10 MG tablet Take 10 mg by mouth daily.        Marland Kitchen aspirin 81 MG EC tablet Take 81 mg by mouth daily.        . benazepril-hydrochlorthiazide (LOTENSIN HCT) 20-12.5 MG per tablet Take 1 tablet by mouth daily.  90 tablet  3  .  cephALEXin (KEFLEX) 500 MG capsule Take 1 capsule (500 mg total) by mouth 4 (four) times daily.  40 capsule  0  . citalopram (CELEXA) 40 MG tablet Take 1 tablet (40 mg total) by mouth daily.  90 tablet  3  . clonazePAM (KLONOPIN) 0.5 MG tablet Take 1 tablet (0.5 mg total) by mouth 2 (two) times daily as needed for anxiety.  60 tablet  2  . gabapentin (NEURONTIN) 300 MG capsule Take 300 mg by mouth. One by mouth nightly      . potassium chloride (KLOR-CON 10) 10 MEQ tablet Take 1 tablet (10 mEq total) by mouth 2 (two) times daily.  90 tablet  3  . rosuvastatin (CRESTOR) 40 MG tablet Take 40 mg by mouth. Take 1/2 once daily       . traMADol (ULTRAM) 50 MG tablet Take 50 mg by mouth every 6 (six) hours as needed.      . zolpidem (AMBIEN CR) 6.25 MG CR tablet Take 6.25 mg by mouth at bedtime as needed.      Marland Kitchen dexlansoprazole (DEXILANT) 60 MG capsule Take 1 capsule (60 mg total) by mouth daily.  90 capsule  3  . zolpidem (AMBIEN CR) 6.25 MG CR tablet Take 1 tablet (6.25 mg total) by mouth at bedtime as needed for sleep.  30 tablet  5   Review  of Systems Review of Systems  Constitutional: Negative for diaphoresis and unexpected weight change.  HENT: Negative for drooling and tinnitus.   Eyes: Negative for photophobia and visual disturbance.  Respiratory: Negative for choking and stridor.   Gastrointestinal: Negative for vomiting and blood in stool.  Genitourinary: Negative for hematuria and decreased urine volume.  Musculoskeletal: Negative for significant gait problem - cont's to deny but has numersous fairly large bilat arm echymoses  - Not on blood thinner Skin: Negative for color change and wound.  except for the above Neurological: Negative for tremors and numbness.  Objective:   Physical Exam BP 122/70  Pulse 82  Temp 97.4 F (36.3 C) (Oral)  Ht 5\' 4"  (1.626 m)  Wt 152 lb 4 oz (69.06 kg)  BMI 26.13 kg/m2  SpO2 94% Physical Exam  VS noted, not ill appearing, seems brighter, able to  ascend exam table without difficulty Constitutional: Pt appears well-developed and well-nourished.  HENT: Head: Normocephalic.  Right Ear: External ear normal.  Left Ear: External ear normal.  Eyes: Conjunctivae and EOM are normal. Pupils are equal, round, and reactive to light.  Neck: Normal range of motion. Neck supple.  Cardiovascular: Normal rate and regular rhythm.   Pulmonary/Chest: Effort normal and breath sounds normal.  Abd:  Soft, NT, non-distended, + BS - benign Neurological: Pt is alert. Motor/dtr intact, gait ok except for mild unsteadiness Skin: Skin is warm. No erythema. No rash but several large distal arm echymoses Psychiatric: Pt behavior is normal - not depressed affect    Assessment & Plan:

## 2012-03-21 NOTE — Assessment & Plan Note (Signed)
stable overall by hx and exam, most recent data reviewed with pt, and pt to continue medical treatment as before BP Readings from Last 3 Encounters:  03/21/12 122/70  03/16/12 152/88  03/06/12 152/90

## 2012-04-06 ENCOUNTER — Other Ambulatory Visit: Payer: Self-pay

## 2012-04-06 MED ORDER — ZOLPIDEM TARTRATE ER 6.25 MG PO TBCR: 6.2500 mg | EXTENDED_RELEASE_TABLET | Freq: Every evening | ORAL | Status: DC | PRN

## 2012-04-06 NOTE — Telephone Encounter (Signed)
Done hardcopy to robin  

## 2012-04-07 NOTE — Telephone Encounter (Signed)
Faxed hardcopy to pharmacy. 

## 2012-04-08 ENCOUNTER — Emergency Department (HOSPITAL_BASED_OUTPATIENT_CLINIC_OR_DEPARTMENT_OTHER): Payer: 59

## 2012-04-08 ENCOUNTER — Encounter (HOSPITAL_BASED_OUTPATIENT_CLINIC_OR_DEPARTMENT_OTHER): Payer: Self-pay | Admitting: *Deleted

## 2012-04-08 ENCOUNTER — Inpatient Hospital Stay (HOSPITAL_BASED_OUTPATIENT_CLINIC_OR_DEPARTMENT_OTHER)
Admission: EM | Admit: 2012-04-08 | Discharge: 2012-04-13 | DRG: 644 | Disposition: A | Discharge status: Home / Self Care | Payer: 59 | Attending: Family Medicine | Admitting: Family Medicine

## 2012-04-08 DIAGNOSIS — I10 Essential (primary) hypertension: Secondary | ICD-10-CM | POA: Diagnosis present

## 2012-04-08 DIAGNOSIS — J309 Allergic rhinitis, unspecified: Secondary | ICD-10-CM

## 2012-04-08 DIAGNOSIS — E871 Hypo-osmolality and hyponatremia: Secondary | ICD-10-CM | POA: Diagnosis present

## 2012-04-08 DIAGNOSIS — W19XXXA Unspecified fall, initial encounter: Secondary | ICD-10-CM | POA: Diagnosis present

## 2012-04-08 DIAGNOSIS — R233 Spontaneous ecchymoses: Secondary | ICD-10-CM

## 2012-04-08 DIAGNOSIS — R413 Other amnesia: Secondary | ICD-10-CM

## 2012-04-08 DIAGNOSIS — Z833 Family history of diabetes mellitus: Secondary | ICD-10-CM

## 2012-04-08 DIAGNOSIS — E876 Hypokalemia: Secondary | ICD-10-CM

## 2012-04-08 DIAGNOSIS — S0292XA Unspecified fracture of facial bones, initial encounter for closed fracture: Secondary | ICD-10-CM | POA: Diagnosis present

## 2012-04-08 DIAGNOSIS — E878 Other disorders of electrolyte and fluid balance, not elsewhere classified: Secondary | ICD-10-CM | POA: Diagnosis present

## 2012-04-08 DIAGNOSIS — S02400A Malar fracture unspecified, initial encounter for closed fracture: Secondary | ICD-10-CM | POA: Diagnosis present

## 2012-04-08 DIAGNOSIS — R42 Dizziness and giddiness: Secondary | ICD-10-CM

## 2012-04-08 DIAGNOSIS — F341 Dysthymic disorder: Secondary | ICD-10-CM

## 2012-04-08 DIAGNOSIS — F411 Generalized anxiety disorder: Secondary | ICD-10-CM

## 2012-04-08 DIAGNOSIS — F1010 Alcohol abuse, uncomplicated: Secondary | ICD-10-CM

## 2012-04-08 DIAGNOSIS — K219 Gastro-esophageal reflux disease without esophagitis: Secondary | ICD-10-CM

## 2012-04-08 DIAGNOSIS — E236 Other disorders of pituitary gland: Principal | ICD-10-CM | POA: Diagnosis present

## 2012-04-08 DIAGNOSIS — M545 Low back pain: Secondary | ICD-10-CM

## 2012-04-08 DIAGNOSIS — Y92009 Unspecified place in unspecified non-institutional (private) residence as the place of occurrence of the external cause: Secondary | ICD-10-CM

## 2012-04-08 DIAGNOSIS — E222 Syndrome of inappropriate secretion of antidiuretic hormone: Secondary | ICD-10-CM

## 2012-04-08 DIAGNOSIS — M25512 Pain in left shoulder: Secondary | ICD-10-CM

## 2012-04-08 DIAGNOSIS — Z0000 Encounter for general adult medical examination without abnormal findings: Secondary | ICD-10-CM

## 2012-04-08 DIAGNOSIS — Z86010 Personal history of colonic polyps: Secondary | ICD-10-CM

## 2012-04-08 DIAGNOSIS — N951 Menopausal and female climacteric states: Secondary | ICD-10-CM

## 2012-04-08 DIAGNOSIS — D649 Anemia, unspecified: Secondary | ICD-10-CM | POA: Diagnosis present

## 2012-04-08 DIAGNOSIS — S0181XA Laceration without foreign body of other part of head, initial encounter: Secondary | ICD-10-CM

## 2012-04-08 DIAGNOSIS — M5030 Other cervical disc degeneration, unspecified cervical region: Secondary | ICD-10-CM

## 2012-04-08 DIAGNOSIS — R109 Unspecified abdominal pain: Secondary | ICD-10-CM

## 2012-04-08 DIAGNOSIS — Z85820 Personal history of malignant melanoma of skin: Secondary | ICD-10-CM

## 2012-04-08 DIAGNOSIS — S02401A Maxillary fracture, unspecified, initial encounter for closed fracture: Secondary | ICD-10-CM | POA: Diagnosis present

## 2012-04-08 DIAGNOSIS — E785 Hyperlipidemia, unspecified: Secondary | ICD-10-CM

## 2012-04-08 DIAGNOSIS — K589 Irritable bowel syndrome without diarrhea: Secondary | ICD-10-CM

## 2012-04-08 DIAGNOSIS — G4700 Insomnia, unspecified: Secondary | ICD-10-CM

## 2012-04-08 DIAGNOSIS — Y998 Other external cause status: Secondary | ICD-10-CM

## 2012-04-08 DIAGNOSIS — IMO0002 Reserved for concepts with insufficient information to code with codable children: Secondary | ICD-10-CM | POA: Diagnosis present

## 2012-04-08 DIAGNOSIS — Z8249 Family history of ischemic heart disease and other diseases of the circulatory system: Secondary | ICD-10-CM

## 2012-04-08 DIAGNOSIS — R9431 Abnormal electrocardiogram [ECG] [EKG]: Secondary | ICD-10-CM

## 2012-04-08 DIAGNOSIS — S129XXA Fracture of neck, unspecified, initial encounter: Secondary | ICD-10-CM | POA: Diagnosis present

## 2012-04-08 DIAGNOSIS — K5730 Diverticulosis of large intestine without perforation or abscess without bleeding: Secondary | ICD-10-CM

## 2012-04-08 LAB — URINALYSIS, ROUTINE W REFLEX MICROSCOPIC
Bilirubin Urine: NEGATIVE
Hgb urine dipstick: NEGATIVE
Ketones, ur: 40 mg/dL — AB
Protein, ur: NEGATIVE mg/dL
Urobilinogen, UA: 1 mg/dL (ref 0.0–1.0)

## 2012-04-08 LAB — COMPREHENSIVE METABOLIC PANEL
Alkaline Phosphatase: 105 U/L (ref 39–117)
BUN: 6 mg/dL (ref 6–23)
Chloride: 67 mEq/L — ABNORMAL LOW (ref 96–112)
Creatinine, Ser: 0.5 mg/dL (ref 0.50–1.10)
GFR calc Af Amer: >90 mL/min (ref >90)
Glucose, Bld: 132 mg/dL — ABNORMAL HIGH (ref 70–99)
Potassium: 2.8 mEq/L — ABNORMAL LOW (ref 3.5–5.1)
Total Bilirubin: 0.9 mg/dL (ref 0.3–1.2)

## 2012-04-08 LAB — CBC
HCT: 32.5 % — ABNORMAL LOW (ref 36.0–46.0)
Hemoglobin: 12.8 g/dL (ref 12.0–15.0)
MCHC: 39.4 g/dL — ABNORMAL HIGH (ref 30.0–36.0)
MCV: 86.2 fL (ref 78.0–100.0)
WBC: 14.8 10*3/uL — ABNORMAL HIGH (ref 4.0–10.5)

## 2012-04-08 LAB — PROTIME-INR
INR: 0.91 (ref 0.00–1.49)
Prothrombin Time: 12.4 seconds (ref 11.6–15.2)

## 2012-04-08 MED ORDER — POTASSIUM CHLORIDE 10 MEQ/100ML IV SOLN
10.0000 meq | INTRAVENOUS | Status: AC
  Administered 2012-04-08 (×2): 10 meq via INTRAVENOUS
  Filled 2012-04-08 (×2): qty 100

## 2012-04-08 MED ORDER — LIP MEDEX EX OINT
TOPICAL_OINTMENT | CUTANEOUS | Status: AC
  Filled 2012-04-08: qty 7

## 2012-04-08 MED ORDER — SODIUM CHLORIDE 0.9 % IV SOLN
Freq: Once | INTRAVENOUS | Status: AC
  Administered 2012-04-08 (×2): via INTRAVENOUS

## 2012-04-08 MED ORDER — PNEUMOCOCCAL VAC POLYVALENT 25 MCG/0.5ML IJ INJ
0.5000 mL | INJECTION | INTRAMUSCULAR | Status: AC
  Filled 2012-04-08: qty 0.5

## 2012-04-08 NOTE — ED Notes (Signed)
0.9% nacl running at 250cc/hr

## 2012-04-08 NOTE — ED Notes (Signed)
Patient's ear rings removed and placed in speciman cup and given to husband

## 2012-04-08 NOTE — ED Notes (Signed)
transported by [facility] to 2[facility]

## 2012-04-08 NOTE — ED Notes (Signed)
I was met by husband in hall needing assistance with patient inher room. Patient had urinated on self. I changed sheets and brought bedside toilet for her. Unable to get urine sample yet.

## 2012-04-08 NOTE — ED Notes (Signed)
Pt found on garage floor by husband. Unwitnessed fall? Pt unable to recall what happened- has some short term memory loss per spouse- face swollen- pt alert at present

## 2012-04-08 NOTE — ED Provider Notes (Signed)
History   This chart was scribed for Gerhard Munch, MD by Shari Heritage. The patient was seen in room MH03/MH03. Patient's care was started at 1604.     CSN: [id number]  Arrival date & time 04/08/12  1604   First MD Initiated Contact with Patient 04/08/12 1614      Chief Complaint  Patient presents with  . Head Injury    (Consider location/radiation/quality/duration/timing/severity/associated sxs/prior treatment) Patient is a 66 y.o. female presenting with head injury. The history is provided by the patient and the spouse. No language interpreter was used.  Head Injury  The incident occurred less than 1 hour ago. She came to the ER via EMS. The injury mechanism was a fall. She lost consciousness for a period of less than one minute. The volume of blood lost was minimal. The quality of the pain is described as dull. The pain is moderate. Associated symptoms include patient experiences disorientation and memory loss. Pertinent negatives include no numbness, no blurred vision, no vomiting and no tinnitus. She was found conscious by EMS personnel. Treatment on the scene included a backboard and a c-collar. She has tried nothing for the symptoms.   Tara Shaffer is a 66 y.o. female who presents to the Emergency Department complaining of head injury with associated bleeding and facial swelling. Patient's husband says that he was walking their [place] and when he came back, he found patient face down on the floor, but conscious. Patient has no memory of the fall and husband did not witness it. Patient's husband says patient has some short term memory, walking and balance issues. Patient shuffles and takes very short steps. Husband reports that patient has also had a decreased appetite.  Patient's says that Alzheimer's has been ruled out. Patient has had a recent MRI. Patient's husband denies cough, fever, chills, and shortness of breath. Patient denies neck pain.  Patient with medical h/o  melanoma, anxiety/depression, degenerative disc disease (C-spine), GERD, hyperlipidemia, HTN, colonic polyps, diverticulosis, IBS, memory dysfunction, lower back pain and alcohol abuse. Patient with surgical h/o C-section, colonoscopy and appendectomy. Husband reports patient also had a hairline left collarbone fracture a few months ago. Patient has never smoked.  Past Medical History  Diagnosis Date  . MELANOMA, HX OF 08/04/2007  . ALLERGIC RHINITIS 06/23/2007  . ANXIETY DEPRESSION 06/12/2009  . ANXIETY 06/23/2007  . 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 GERD 06/23/2007  . HYPERLIPIDEMIA 06/24/2007  . HYPERTENSION 06/23/2007  . COLONIC POLYPS, HX OF 03/08/2002  . DIVERTICULOSIS, COLON 08/04/2007  . Irritable bowel syndrome 06/23/2007  . Memory dysfunction 06/23/2011  . Cancer     skin cx/ hx melanoma  . LOW BACK PAIN 06/24/2007  . Alcohol abuse 02/27/2012    Past Surgical History  Procedure Date  . Cesarean section     x 2  . Colonoscopy   . Appendectomy     Family History  Problem Relation Age of Onset  . Hypertension Other   . Diabetes Other   . Colon cancer Cousin     History  Substance Use Topics  . Smoking status: Never Smoker   . Smokeless tobacco: Never Used  . Alcohol Use: Yes    OB History    Grav Para Term Preterm Abortions TAB SAB Ect Mult Living                  Review of Systems  Constitutional: Positive for appetite change.  HENT: Positive for facial swelling and neck pain.  Negative for tinnitus.   Eyes: Negative for blurred vision.  Cardiovascular: Negative for chest pain.  Gastrointestinal: Negative for vomiting and abdominal pain.  Skin: Positive for wound.  Neurological: Negative for numbness.  Psychiatric/Behavioral: Positive for memory loss.    Allergies  Codeine phosphate  Home Medications   Current Outpatient Rx  Name Route Sig Dispense Refill  . AMLODIPINE BESYLATE 10 MG PO TABS Oral Take 10 mg by mouth daily.      .  ASPIRIN 81 MG PO TBEC Oral Take 81 mg by mouth daily.      Marland Kitchen BENAZEPRIL-HYDROCHLOROTHIAZIDE 20-12.5 MG PO TABS Oral Take 1 tablet by mouth daily. 90 tablet 3  . CITALOPRAM HYDROBROMIDE 40 MG PO TABS Oral Take 1 tablet (40 mg total) by mouth daily. 90 tablet 3  . CLONAZEPAM 0.5 MG PO TABS Oral Take 1 tablet (0.5 mg total) by mouth 2 (two) times daily as needed for anxiety. 60 tablet 2  . DEXLANSOPRAZOLE 60 MG PO CPDR Oral Take 1 capsule (60 mg total) by mouth daily. 90 capsule 3  . GABAPENTIN 300 MG PO CAPS Oral Take 300 mg by mouth. One by mouth nightly    . POTASSIUM CHLORIDE ER 10 MEQ PO TBCR Oral Take 1 tablet (10 mEq total) by mouth 2 (two) times daily. 90 tablet 3  . ROSUVASTATIN CALCIUM 40 MG PO TABS Oral Take 40 mg by mouth. Take 1/2 once daily     . TRAMADOL HCL 50 MG PO TABS Oral Take 50 mg by mouth every 6 (six) hours as needed.    Marland Kitchen ZOLPIDEM TARTRATE ER 6.25 MG PO TBCR Oral Take 1 tablet (6.25 mg total) by mouth at bedtime as needed for sleep. 30 tablet 5  . ZOLPIDEM TARTRATE ER 6.25 MG PO TBCR Oral Take 1 tablet (6.25 mg total) by mouth at bedtime as needed. 30 tablet 5    BP 151/97  Pulse 98  Temp 98.1 F (36.7 C) (Oral)  Resp 18  Ht 5\' 5"  (1.651 m)  SpO2 94%  Physical Exam  Nursing note and vitals reviewed. Constitutional: She is oriented to person, place, and time. She appears well-developed and well-nourished. No distress. She is restrained. Cervical collar and backboard in place.       Patient is alert and responsive to commands and questions.  HENT:  Head: Head is with contusion and with laceration.  Nose: Mucosal edema and nose lacerations (L-shaped laceration 3cm in length on bridge of nose running off to left.) present. No nasal deformity or nasal septal hematoma.    Mouth/Throat: Uvula is midline.       Dominant and pronounced right-sided facial swelling. Bleeding from laceration to nose is controlled.  Eyes: Conjunctivae and EOM are normal.  Neck: Muscular  tenderness present. Decreased range of motion present.  Cardiovascular: Normal rate and regular rhythm.   Pulmonary/Chest: Effort normal and breath sounds normal. No stridor. No respiratory distress.  Abdominal: She exhibits no distension.  Musculoskeletal: She exhibits no edema.  Neurological: She is alert and oriented to person, place, and time. No cranial nerve deficit.  Skin: Skin is warm and dry.  Psychiatric: She has a normal mood and affect. She is communicative. She exhibits abnormal recent memory.    ED Course  Procedures (including critical care time) DIAGNOSTIC STUDIES: Oxygen Saturation is 94% on room air, normal by my interpretation.    COORDINATION OF CARE: 4:15pm- Patient informed of current plan for treatment and evaluation and agrees with plan at  this time. Will order labs and CT of head.  6:03pm- Updated patient on lab results. Patient is exhibiting low sodium levels. Removed C-collar.  6:08pm- Performed laceration repair. LACERATION REPAIR Performed by: Gerhard Munch, MD Consent: Verbal consent obtained. Risks and benefits: risks, benefits and alternatives were discussed Patient identity confirmed: provided demographic data Time out performed prior to procedure Prepped and Draped in normal sterile fashion Wound explored  Laceration Location: bridge of nose  Laceration Length: 3cm  No Foreign Bodies seen or palpated  Local anesthetic: None  Irrigation method: syringe  Amount of cleaning: standard  Skin closure: Simple  Technique: Dermabond  Patient tolerance: Patient tolerated the procedure well with no immediate complications.  6:45PM- Performed consult with hospitalist at [facility]. Patient's case was explained and discussed. Patient will be transferred to [facility].  Results for orders placed during the hospital encounter of 04/08/12  CBC      Component Value Range   WBC 14.8 (*) 4.0 - 10.5 K/uL   RBC 3.77 (*) 3.87 - 5.11 MIL/uL    Hemoglobin 12.8  12.0 - 15.0 g/dL   HCT 21.3 (*) 08.6 - 57.8 %   MCV 86.2  78.0 - 100.0 fL   MCH 34.0  26.0 - 34.0 pg   MCHC 39.4 (*) 30.0 - 36.0 g/dL   RDW 46.9  62.9 - 52.8 %   Platelets 279  150 - 400 K/uL  COMPREHENSIVE METABOLIC PANEL      Component Value Range   Sodium 106 (*) 135 - 145 mEq/L   Potassium 2.8 (*) 3.5 - 5.1 mEq/L   Chloride 67 (*) 96 - 112 mEq/L   CO2 25  19 - 32 mEq/L   Glucose, Bld 132 (*) 70 - 99 mg/dL   BUN 6  6 - 23 mg/dL   Creatinine, Ser 4.13  0.50 - 1.10 mg/dL   Calcium 9.1  8.4 - 24.4 mg/dL   Total Protein 7.1  6.0 - 8.3 g/dL   Albumin 4.0  3.5 - 5.2 g/dL   AST 47 (*) 0 - 37 U/L   ALT 42 (*) 0 - 35 U/L   Alkaline Phosphatase 105  39 - 117 U/L   Total Bilirubin 0.9  0.3 - 1.2 mg/dL   GFR calc non Af Amer >90  >90 mL/min   GFR calc Af Amer >90  >90 mL/min  PROTIME-INR      Component Value Range   Prothrombin Time 12.4  11.6 - 15.2 seconds   INR 0.91  0.00 - 1.49   Dg Chest 2 View  04/08/2012  *RADIOLOGY REPORT*  Clinical Data: Unwitnessed fall.  High blood pressure.  CHEST - 2 VIEW  Comparison: 02/09/2011 and 08/20/2008.  Findings: Granuloma peripheral right lung stable.  Scarring left costophrenic angle region unchanged.  Tortuous aorta.  Cardiomegaly.  Central pulmonary vascular prominence.  No segmental infiltrate or pneumothorax.  No obvious fracture.  IMPRESSION: Granuloma peripheral right lung stable.  Scarring left costophrenic angle region unchanged.  Tortuous aorta.  Cardiomegaly.  Central pulmonary vascular prominence.  No segmental infiltrate or pneumothorax.  No obvious fracture.  Original Report Authenticated By: Fuller Canada, M.D.   Ct Head Wo Contrast  04/08/2012  *RADIOLOGY REPORT*  Clinical Data:  Unwitnessed fall.  Head pain.  Neck pain.  Face pain.  CT HEAD WITHOUT CONTRAST CT MAXILLOFACIAL WITHOUT CONTRAST CT CERVICAL SPINE WITHOUT CONTRAST  Technique:  Multidetector CT imaging of the head, cervical spine, and maxillofacial  structures were performed  using the standard protocol without intravenous contrast. Multiplanar CT image reconstructions of the cervical spine and maxillofacial structures were also generated.  Comparison:  MRI head 06/30/2011.  CT HEAD  Findings:  There is no evidence for acute infarction, intracranial hemorrhage, mass lesion, hydrocephalus, or extra-axial fluid. There is mild atrophy and chronic microvascular ischemic change. There is no skull fracture.  Mastoids are clear.  Paranasal sinuses discussed below. The appearance is similar to prior MR from 2012.  IMPRESSION: Mild atrophy and small vessel disease.  No acute intracranial findings.  CT MAXILLOFACIAL  Findings:  Multiple facial fractures are present.  The patient has sustained a direct blow to the face and there is a large amount of subcutaneous emphysema, greater on the right.  The fractures are combination of LeFort I and [place] II.  With regard to type I, there is dysjunction of the maxilla, particularly on the right, from the rest of the face.  Fracture extends across both pterygoid plates. The linear fracture across the left maxilla is incomplete.  With regard to [facility] II, there are comminuted nasal bone fractures which extend across the nasal bridge into the orbits. The inferior orbital rim on the right is discontinuous and there is a small right inferior orbital blowout fracture.  The left inferior orbital rim is grossly intact although there may be a small nondisplaced fracture of the left inferior orbital floor.  The nasal cavity is filled with blood and the bony nasal septum is fractured and deviated leftward.  Both maxillary sinuses are filled with blood, greater on the right.  There is abundant sub tarsal air on the right and subcutaneous emphysema dissects downward on both sides of the neck without compromise of the airway. Air-fluid levels seen in the sphenoid and ethmoid sinuses but there is no visible basilar skull fracture.  Frontal  sinuses clear.  No mandibular fracture or TMJ dislocation.  IMPRESSION: Complex facial fractures which represent features of both Lefort I and [place] II fractures, greater on the right.  Small inferior orbital blowout fracture on the right.  Bilateral maxillary sinus hemorrhage.  Nasal bone fractures are comminuted.  Right orbital emphysema with subtarsal area and extensive subcutaneous emphysema throughout the neck.  CT CERVICAL SPINE  Findings:   There is no compression fracture or traumatic subluxation.  There is a small nondisplaced fracture of the inferior articular process of C5 on the right (image 52 series 8). This does not result in instability.  Mild reversal of the normal cervical lordotic curve relates to degenerative change.  There is no prevertebral soft tissue swelling.   Multilevel level facet arthropathy is present.  The odontoid is intact.  IMPRESSION: Small nondisplaced fracture of the inferior articular process of C5 on the right.  This  would appear to be a stable fracture. No traumatic subluxation is observed.  Multilevel spondylosis as described.  Discussed with EDP.  Original Report Authenticated By: Elsie Stain, M.D.   Ct Cervical Spine Wo Contrast  04/08/2012  *RADIOLOGY REPORT*  Clinical Data:  Unwitnessed fall.  Head pain.  Neck pain.  Face pain.  CT HEAD WITHOUT CONTRAST CT MAXILLOFACIAL WITHOUT CONTRAST CT CERVICAL SPINE WITHOUT CONTRAST  Technique:  Multidetector CT imaging of the head, cervical spine, and maxillofacial structures were performed using the standard protocol without intravenous contrast. Multiplanar CT image reconstructions of the cervical spine and maxillofacial structures were also generated.  Comparison:  MRI head 06/30/2011.  CT HEAD  Findings:  There is no  evidence for acute infarction, intracranial hemorrhage, mass lesion, hydrocephalus, or extra-axial fluid. There is mild atrophy and chronic microvascular ischemic change. There is no skull fracture.   Mastoids are clear.  Paranasal sinuses discussed below. The appearance is similar to prior MR from 2012.  IMPRESSION: Mild atrophy and small vessel disease.  No acute intracranial findings.  CT MAXILLOFACIAL  Findings:  Multiple facial fractures are present.  The patient has sustained a direct blow to the face and there is a large amount of subcutaneous emphysema, greater on the right.  The fractures are combination of LeFort I and [place] II.  With regard to type I, there is dysjunction of the maxilla, particularly on the right, from the rest of the face.  Fracture extends across both pterygoid plates. The linear fracture across the left maxilla is incomplete.  With regard to [facility] II, there are comminuted nasal bone fractures which extend across the nasal bridge into the orbits. The inferior orbital rim on the right is discontinuous and there is a small right inferior orbital blowout fracture.  The left inferior orbital rim is grossly intact although there may be a small nondisplaced fracture of the left inferior orbital floor.  The nasal cavity is filled with blood and the bony nasal septum is fractured and deviated leftward.  Both maxillary sinuses are filled with blood, greater on the right.  There is abundant sub tarsal air on the right and subcutaneous emphysema dissects downward on both sides of the neck without compromise of the airway. Air-fluid levels seen in the sphenoid and ethmoid sinuses but there is no visible basilar skull fracture.  Frontal sinuses clear.  No mandibular fracture or TMJ dislocation.  IMPRESSION: Complex facial fractures which represent features of both Lefort I and [place] II fractures, greater on the right.  Small inferior orbital blowout fracture on the right.  Bilateral maxillary sinus hemorrhage.  Nasal bone fractures are comminuted.  Right orbital emphysema with subtarsal area and extensive subcutaneous emphysema throughout the neck.  CT CERVICAL SPINE  Findings:   There is  no compression fracture or traumatic subluxation.  There is a small nondisplaced fracture of the inferior articular process of C5 on the right (image 52 series 8). This does not result in instability.  Mild reversal of the normal cervical lordotic curve relates to degenerative change.  There is no prevertebral soft tissue swelling.   Multilevel level facet arthropathy is present.  The odontoid is intact.  IMPRESSION: Small nondisplaced fracture of the inferior articular process of C5 on the right.  This  would appear to be a stable fracture. No traumatic subluxation is observed.  Multilevel spondylosis as described.  Discussed with EDP.  Original Report Authenticated By: Elsie Stain, M.D.   Ct Maxillofacial Wo Cm  04/08/2012  *RADIOLOGY REPORT*  Clinical Data:  Unwitnessed fall.  Head pain.  Neck pain.  Face pain.  CT HEAD WITHOUT CONTRAST CT MAXILLOFACIAL WITHOUT CONTRAST CT CERVICAL SPINE WITHOUT CONTRAST  Technique:  Multidetector CT imaging of the head, cervical spine, and maxillofacial structures were performed using the standard protocol without intravenous contrast. Multiplanar CT image reconstructions of the cervical spine and maxillofacial structures were also generated.  Comparison:  MRI head 06/30/2011.  CT HEAD  Findings:  There is no evidence for acute infarction, intracranial hemorrhage, mass lesion, hydrocephalus, or extra-axial fluid. There is mild atrophy and chronic microvascular ischemic change. There is no skull fracture.  Mastoids are clear.  Paranasal sinuses discussed below. The  appearance is similar to prior MR from 2012.  IMPRESSION: Mild atrophy and small vessel disease.  No acute intracranial findings.  CT MAXILLOFACIAL  Findings:  Multiple facial fractures are present.  The patient has sustained a direct blow to the face and there is a large amount of subcutaneous emphysema, greater on the right.  The fractures are combination of LeFort I and Le Fort II.  With regard to type I,  there is dysjunction of the maxilla, particularly on the right, from the rest of the face.  Fracture extends across both pterygoid plates. The linear fracture across the left maxilla is incomplete.  With regard to [facility] II, there are comminuted nasal bone fractures which extend across the nasal bridge into the orbits. The inferior orbital rim on the right is discontinuous and there is a small right inferior orbital blowout fracture.  The left inferior orbital rim is grossly intact although there may be a small nondisplaced fracture of the left inferior orbital floor.  The nasal cavity is filled with blood and the bony nasal septum is fractured and deviated leftward.  Both maxillary sinuses are filled with blood, greater on the right.  There is abundant sub tarsal air on the right and subcutaneous emphysema dissects downward on both sides of the neck without compromise of the airway. Air-fluid levels seen in the sphenoid and ethmoid sinuses but there is no visible basilar skull fracture.  Frontal sinuses clear.  No mandibular fracture or TMJ dislocation.  IMPRESSION: Complex facial fractures which represent features of both Lefort I and [place] II fractures, greater on the right.  Small inferior orbital blowout fracture on the right.  Bilateral maxillary sinus hemorrhage.  Nasal bone fractures are comminuted.  Right orbital emphysema with subtarsal area and extensive subcutaneous emphysema throughout the neck.  CT CERVICAL SPINE  Findings:   There is no compression fracture or traumatic subluxation.  There is a small nondisplaced fracture of the inferior articular process of C5 on the right (image 52 series 8). This does not result in instability.  Mild reversal of the normal cervical lordotic curve relates to degenerative change.  There is no prevertebral soft tissue swelling.   Multilevel level facet arthropathy is present.  The odontoid is intact.  IMPRESSION: Small nondisplaced fracture of the inferior  articular process of C5 on the right.  This  would appear to be a stable fracture. No traumatic subluxation is observed.  Multilevel spondylosis as described.  Discussed with EDP.  Original Report Authenticated By: Elsie Stain, M.D.      Dg Chest 2 View  04/08/2012  *RADIOLOGY REPORT*  Clinical Data: Unwitnessed fall.  High blood pressure.  CHEST - 2 VIEW  Comparison: 02/09/2011 and 08/20/2008.  Findings: Granuloma peripheral right lung stable.  Scarring left costophrenic angle region unchanged.  Tortuous aorta.  Cardiomegaly.  Central pulmonary vascular prominence.  No segmental infiltrate or pneumothorax.  No obvious fracture.  IMPRESSION: Granuloma peripheral right lung stable.  Scarring left costophrenic angle region unchanged.  Tortuous aorta.  Cardiomegaly.  Central pulmonary vascular prominence.  No segmental infiltrate or pneumothorax.  No obvious fracture.  Original Report Authenticated By: Fuller Canada, M.D.     No diagnosis found.  Cardiac: 90sr, nml Pulse ox 99%ra, normal   Date: 04/08/2012  Rate: 91  Rhythm: normal sinus rhythm  QRS Axis: normal  Intervals: normal  ST/T Wave abnormalities: normal  Conduction Disutrbances:artefact  Narrative Interpretation:   Old EKG Reviewed: none available Unremarkable ECG  MDM  I personally performed the services described in this documentation, which was scribed in my presence. The recorded information has been reviewed and considered.  This elderly female presents following a fall.  Notably, the patient has recently been evaluated for new gait instability, and increasing frequency of falls.  It was unclear if she has any previous evidence of a likely abnormalities as notable as today.  On my exam the patient is awake, alert, interactive, but has notable facial trauma.  Given the degree of facial swelling and ecchymosis, there suspicion for maxillofacial fracture, and or intracranial hemorrhage.  The patient's renographic studies  are notable for multiple facial fractures, though no injuries requiring emergent intervention.  The patient also has a fractured cervical vertebrae.  Posterior lip of the patient's evaluation straights hyponatremia, hypo-or anemia.  Labs from a one month ago did not have these abnormalities.  On repeat evaluation the patient's husband notes that she has been eating well over the past month, but denies any other notable recent changes.  Given these findings there is some suspicion of seizure like activity and/or syncope prior to the event.  Reassuringly, the patient's ECG is largely unremarkable, though there is mild artifact.  Given the impressive hyponatremia the patient was resuscitated with normal saline.  The patient was admitted to the ICU for continued monitoring of her critically abnormal electrolyte values.  CRITICAL CARE Performed by: Gerhard Munch   Total critical care time: 35  Critical care time was exclusive of separately billable procedures and treating other patients.  Critical care was necessary to treat or prevent imminent or life-threatening deterioration.  Critical care was time spent personally by me on the following activities: development of treatment plan with patient and/or surrogate as well as nursing, discussions with consultants, evaluation of patient's response to treatment, examination of patient, obtaining history from patient or surrogate, ordering and performing treatments and interventions, ordering and review of laboratory studies, ordering and review of radiographic studies, pulse oximetry and re-evaluation of patient's condition.    Gerhard Munch, MD 04/08/12 2007

## 2012-04-09 ENCOUNTER — Inpatient Hospital Stay (HOSPITAL_COMMUNITY): Payer: 59

## 2012-04-09 DIAGNOSIS — IMO0002 Reserved for concepts with insufficient information to code with codable children: Secondary | ICD-10-CM

## 2012-04-09 DIAGNOSIS — S129XXA Fracture of neck, unspecified, initial encounter: Secondary | ICD-10-CM

## 2012-04-09 DIAGNOSIS — W19XXXA Unspecified fall, initial encounter: Secondary | ICD-10-CM

## 2012-04-09 DIAGNOSIS — S0990XA Unspecified injury of head, initial encounter: Secondary | ICD-10-CM

## 2012-04-09 DIAGNOSIS — R55 Syncope and collapse: Secondary | ICD-10-CM

## 2012-04-09 DIAGNOSIS — E871 Hypo-osmolality and hyponatremia: Secondary | ICD-10-CM

## 2012-04-09 LAB — BASIC METABOLIC PANEL
BUN: 6 mg/dL (ref 6–23)
BUN: 3 mg/dL — ABNORMAL LOW (ref 6–23)
CO2: 21 mEq/L (ref 19–32)
CO2: 20 mEq/L (ref 19–32)
Calcium: 8.2 mg/dL — ABNORMAL LOW (ref 8.4–10.5)
Calcium: 8.4 mg/dL (ref 8.4–10.5)
Calcium: 8.5 mg/dL (ref 8.4–10.5)
Creatinine, Ser: 0.45 mg/dL — ABNORMAL LOW (ref 0.50–1.10)
Creatinine, Ser: 0.44 mg/dL — ABNORMAL LOW (ref 0.50–1.10)
GFR calc Af Amer: >90 mL/min (ref >90)
GFR calc Af Amer: >90 mL/min (ref >90)
GFR calc non Af Amer: >90 mL/min (ref >90)
GFR calc non Af Amer: >90 mL/min (ref >90)
GFR calc non Af Amer: >90 mL/min (ref >90)
Glucose, Bld: 111 mg/dL — ABNORMAL HIGH (ref 70–99)
Glucose, Bld: 84 mg/dL (ref 70–99)
Potassium: 2.7 mEq/L — CL (ref 3.5–5.1)
Sodium: 109 mEq/L — CL (ref 135–145)
Sodium: 113 mEq/L — CL (ref 135–145)

## 2012-04-09 LAB — CBC
Hemoglobin: 12 g/dL (ref 12.0–15.0)
MCH: 33.1 pg (ref 26.0–34.0)
MCHC: 38.5 g/dL — ABNORMAL HIGH (ref 30.0–36.0)
MCV: 86.3 fL (ref 78.0–100.0)
Platelets: 235 10*3/uL (ref 150–400)
Platelets: 231 10*3/uL (ref 150–400)
RBC: 3.66 MIL/uL — ABNORMAL LOW (ref 3.87–5.11)
RDW: 12.1 % (ref 11.5–15.5)
WBC: 8.9 10*3/uL (ref 4.0–10.5)

## 2012-04-09 LAB — PROTIME-INR
INR: 1.06 (ref 0.00–1.49)
Prothrombin Time: 14 seconds (ref 11.6–15.2)

## 2012-04-09 MED ORDER — AMLODIPINE BESYLATE 10 MG PO TABS
10.0000 mg | ORAL_TABLET | Freq: Every day | ORAL | Status: DC
  Administered 2012-04-09 – 2012-04-13 (×5): 10 mg via ORAL
  Filled 2012-04-09 (×6): qty 1

## 2012-04-09 MED ORDER — SODIUM CHLORIDE 0.9 % IV SOLN
INTRAVENOUS | Status: DC
  Administered 2012-04-09: 01:00:00 via INTRAVENOUS
  Administered 2012-04-10: 100 mL/h via INTRAVENOUS
  Administered 2012-04-10: 20:00:00 via INTRAVENOUS
  Administered 2012-04-10: 50 mL/h via INTRAVENOUS

## 2012-04-09 MED ORDER — POTASSIUM CHLORIDE 10 MEQ/100ML IV SOLN
10.0000 meq | INTRAVENOUS | Status: AC
  Administered 2012-04-09 (×4): 10 meq via INTRAVENOUS
  Filled 2012-04-09 (×3): qty 100

## 2012-04-09 MED ORDER — WHITE PETROLATUM GEL
Status: AC
  Administered 2012-04-09: 06:00:00
  Filled 2012-04-09: qty 5

## 2012-04-09 MED ORDER — POTASSIUM CHLORIDE 10 MEQ/100ML IV SOLN
INTRAVENOUS | Status: AC
  Filled 2012-04-09: qty 100

## 2012-04-09 MED ORDER — POTASSIUM CHLORIDE 10 MEQ/100ML IV SOLN
10.0000 meq | INTRAVENOUS | Status: AC
  Administered 2012-04-09 – 2012-04-10 (×4): 10 meq via INTRAVENOUS
  Filled 2012-04-09 (×4): qty 100

## 2012-04-09 MED ORDER — POTASSIUM CHLORIDE 10 MEQ/100ML IV SOLN
10.0000 meq | INTRAVENOUS | Status: AC
  Administered 2012-04-09 (×4): 10 meq via INTRAVENOUS
  Filled 2012-04-09: qty 400

## 2012-04-09 MED ORDER — POTASSIUM CHLORIDE 20 MEQ/15ML (10%) PO LIQD
40.0000 meq | Freq: Once | ORAL | Status: DC
  Filled 2012-04-09: qty 30

## 2012-04-09 MED ORDER — PANTOPRAZOLE SODIUM 40 MG PO TBEC
40.0000 mg | DELAYED_RELEASE_TABLET | Freq: Every day | ORAL | Status: DC
  Administered 2012-04-09 – 2012-04-10 (×2): 40 mg via ORAL
  Filled 2012-04-09 (×2): qty 1

## 2012-04-09 MED ORDER — CITALOPRAM HYDROBROMIDE 40 MG PO TABS
40.0000 mg | ORAL_TABLET | Freq: Every day | ORAL | Status: DC
  Administered 2012-04-09 – 2012-04-10 (×2): 40 mg via ORAL
  Filled 2012-04-09 (×3): qty 1

## 2012-04-09 MED ORDER — SODIUM CHLORIDE 0.9 % IV SOLN: 250.0000 mL | INTRAVENOUS | Status: DC | PRN

## 2012-04-09 MED ORDER — CLONAZEPAM 0.5 MG PO TABS
0.5000 mg | ORAL_TABLET | Freq: Two times a day (BID) | ORAL | Status: DC | PRN
  Administered 2012-04-09 – 2012-04-12 (×4): 0.5 mg via ORAL
  Filled 2012-04-09 (×4): qty 1

## 2012-04-09 NOTE — Consult Note (Signed)
Reason for Consult: Facial fractures Referring Physician: Trauma  Tara Shaffer is an 66 y.o. female.  HPI: 66 year old who was involved in a unwitnessed fall onto her face. She is a lot of medical issues that are currently being addressed including a very low sodium. She has a CT scan which shows some fractures of her nasal bone, maxillary sinus including a LeFort I and partial LeFort II, and orbital fracture. She does not have any significant displacement of any of these fractures. The left side appears to be intact as far as the continuity of the LeFort I. It appears the LeFort one is only on the right side. She denies any malocclusion. She really doesn't complain about any pain. Did blow her nose after the injury and is has a lot of subcutaneous emphysema. Her vision is intact. She has no double vision. She is able to breathe through her nose. She currently has a a 106 valuesodium of 113 and it started off as a 106 value   Past Medical History  Diagnosis Date  . MELANOMA, HX OF 08/04/2007  . ALLERGIC RHINITIS 06/23/2007  . ANXIETY DEPRESSION 06/12/2009  . ANXIETY 06/23/2007  . 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 GERD 06/23/2007  . HYPERLIPIDEMIA 06/24/2007  . HYPERTENSION 06/23/2007  . COLONIC POLYPS, HX OF 03/08/2002  . DIVERTICULOSIS, COLON 08/04/2007  . Irritable bowel syndrome 06/23/2007  . Memory dysfunction 06/23/2011  . Cancer     skin cx/ hx melanoma  . LOW BACK PAIN 06/24/2007  . Alcohol abuse 02/27/2012    Past Surgical History  Procedure Date  . Cesarean section     x 2  . Colonoscopy   . Appendectomy     Family History  Problem Relation Age of Onset  . Hypertension Other   . Diabetes Other   . Colon cancer Cousin     Social History:  reports that she has never smoked. She has never used smokeless tobacco. She reports that she drinks about 25.2 ounces of alcohol per week. She reports that she does not use illicit drugs.  Allergies:  Allergies    Allergen Reactions  . Codeine Phosphate Nausea Only    Medications: I have reviewed the patient's current medications.  Results for orders placed during the hospital encounter of 04/08/12 (from the past 48 hour(s))  CBC     Status: Abnormal   Collection Time   04/08/12  4:34 PM      Component Value Range Comment   WBC 14.8 (*) 4.0 - 10.5 K/uL    RBC 3.77 (*) 3.87 - 5.11 MIL/uL    Hemoglobin 12.8  12.0 - 15.0 g/dL    HCT 21.3 (*) 08.6 - 46.0 %    MCV 86.2  78.0 - 100.0 fL    MCH 34.0  26.0 - 34.0 pg    MCHC 39.4 (*) 30.0 - 36.0 g/dL RULED OUT INTERFERING SUBSTANCES   RDW 11.7  11.5 - 15.5 %    Platelets 279  150 - 400 K/uL   COMPREHENSIVE METABOLIC PANEL     Status: Abnormal   Collection Time   04/08/12  4:34 PM      Component Value Range Comment   Sodium 106 (*) 135 - 145 mEq/L    Potassium 2.8 (*) 3.5 - 5.1 mEq/L    Chloride 67 (*) 96 - 112 mEq/L    CO2 25  19 - 32 mEq/L    Glucose, Bld 132 (*) 70 - 99 mg/dL  BUN 6  6 - 23 mg/dL    Creatinine, Ser 1.61  0.50 - 1.10 mg/dL    Calcium 9.1  8.4 - 09.6 mg/dL    Total Protein 7.1  6.0 - 8.3 g/dL    Albumin 4.0  3.5 - 5.2 g/dL    AST 47 (*) 0 - 37 U/L    ALT 42 (*) 0 - 35 U/L    Alkaline Phosphatase 105  39 - 117 U/L    Total Bilirubin 0.9  0.3 - 1.2 mg/dL    GFR calc non Af Amer >90  >90 mL/min    GFR calc Af Amer >90  >90 mL/min   PROTIME-INR     Status: Normal   Collection Time   04/08/12  4:34 PM      Component Value Range Comment   Prothrombin Time 12.4  11.6 - 15.2 seconds    INR 0.91  0.00 - 1.49   URINALYSIS, ROUTINE W REFLEX MICROSCOPIC     Status: Abnormal   Collection Time   04/08/12  8:04 PM      Component Value Range Comment   Color, Urine YELLOW  YELLOW    APPearance CLEAR  CLEAR    Specific Gravity, Urine 1.013  1.005 - 1.030    pH 7.5  5.0 - 8.0    Glucose, UA 100 (*) NEGATIVE mg/dL    Hgb urine dipstick NEGATIVE  NEGATIVE    Bilirubin Urine NEGATIVE  NEGATIVE    Ketones, ur 40 (*) NEGATIVE mg/dL     Protein, ur NEGATIVE  NEGATIVE mg/dL    Urobilinogen, UA 1.0  0.0 - 1.0 mg/dL    Nitrite NEGATIVE  NEGATIVE    Leukocytes, UA NEGATIVE  NEGATIVE MICROSCOPIC NOT DONE ON URINES WITH NEGATIVE PROTEIN, BLOOD, LEUKOCYTES, NITRITE, OR GLUCOSE <1000 mg/dL.  MRSA PCR SCREENING     Status: Normal   Collection Time   04/08/12 10:04 PM      Component Value Range Comment   MRSA by PCR NEGATIVE  NEGATIVE   BASIC METABOLIC PANEL     Status: Abnormal   Collection Time   04/09/12  1:00 AM      Component Value Range Comment   Sodium 109 (*) 135 - 145 mEq/L    Potassium 2.7 (*) 3.5 - 5.1 mEq/L    Chloride 76 (*) 96 - 112 mEq/L    CO2 21  19 - 32 mEq/L    Glucose, Bld 111 (*) 70 - 99 mg/dL    BUN 6  6 - 23 mg/dL    Creatinine, Ser 0.45 (*) 0.50 - 1.10 mg/dL    Calcium 8.2 (*) 8.4 - 10.5 mg/dL    GFR calc non Af Amer >90  >90 mL/min    GFR calc Af Amer >90  >90 mL/min   CBC     Status: Abnormal   Collection Time   04/09/12  1:00 AM      Component Value Range Comment   WBC 13.1 (*) 4.0 - 10.5 K/uL    RBC 3.62 (*) 3.87 - 5.11 MIL/uL    Hemoglobin 12.0  12.0 - 15.0 g/dL    HCT 40.9 (*) 81.1 - 46.0 %    MCV 86.2  78.0 - 100.0 fL    MCH 33.1  26.0 - 34.0 pg    MCHC 38.5 (*) 30.0 - 36.0 g/dL RULED OUT INTERFERING SUBSTANCES   RDW 12.1  11.5 - 15.5 %    Platelets 235  150 -  400 K/uL   PROTIME-INR     Status: Normal   Collection Time   04/09/12  1:00 AM      Component Value Range Comment   Prothrombin Time 14.0  11.6 - 15.2 seconds    INR 1.06  0.00 - 1.49   OSMOLALITY     Status: Abnormal   Collection Time   04/09/12  1:00 AM      Component Value Range Comment   Osmolality 223 (*) 275 - 300 mOsm/kg   OSMOLALITY, URINE     Status: Abnormal   Collection Time   04/09/12  2:05 AM      Component Value Range Comment   Osmolality, Ur 221 (*) 390 - 1090 mOsm/kg     Dg Chest 2 View  04/08/2012  *RADIOLOGY REPORT*  Clinical Data: Unwitnessed fall.  High blood pressure.  CHEST - 2 VIEW  Comparison:  02/09/2011 and 08/20/2008.  Findings: Granuloma peripheral right lung stable.  Scarring left costophrenic angle region unchanged.  Tortuous aorta.  Cardiomegaly.  Central pulmonary vascular prominence.  No segmental infiltrate or pneumothorax.  No obvious fracture.  IMPRESSION: Granuloma peripheral right lung stable.  Scarring left costophrenic angle region unchanged.  Tortuous aorta.  Cardiomegaly.  Central pulmonary vascular prominence.  No segmental infiltrate or pneumothorax.  No obvious fracture.  Original Report Authenticated By: Fuller Canada, M.D.   Dg Thoracic Spine 2 View  04/09/2012  *RADIOLOGY REPORT*  Clinical Data: Trauma.  Rule out fracture. C5 fracture.  THORACIC SPINE - 2 VIEW  Comparison: CT cervical spine 04/08/2012  Findings: Extensive subcutaneous emphysema about the neck, presumably from facial and sinus fractures.  The frontal view demonstrates normal paraspinous contours.  The lateral view images from approximately the top of T3 through T12.  Maintenance of vertebral body height across these levels. The swimmer's view has obscured cervical thoracic junction, but T1 and T2 are within normal limits on the prior CT.  IMPRESSION: No acute findings in the thoracic spine.  Subcutaneous emphysema, presumably from facial fractures.  Original Report Authenticated By: Consuello Bossier, M.D.   Dg Lumbar Spine 2-3 Views  04/09/2012  *RADIOLOGY REPORT*  Clinical Data: C5 fracture.  Recent trauma.  LUMBAR SPINE - 2-3 VIEW  Comparison: None.  Findings: Five lumbar type vertebral bodies.   Sacroiliac joints are symmetric.  Maintenance of vertebral body height and alignment. Degenerative disc disease at L4-L5.  IMPRESSION: Spondylosis, without acute finding.  Original Report Authenticated By: Consuello Bossier, M.D.   Ct Head Wo Contrast  04/08/2012  *RADIOLOGY REPORT*  Clinical Data:  Unwitnessed fall.  Head pain.  Neck pain.  Face pain.  CT HEAD WITHOUT CONTRAST CT MAXILLOFACIAL WITHOUT CONTRAST CT  CERVICAL SPINE WITHOUT CONTRAST  Technique:  Multidetector CT imaging of the head, cervical spine, and maxillofacial structures were performed using the standard protocol without intravenous contrast. Multiplanar CT image reconstructions of the cervical spine and maxillofacial structures were also generated.  Comparison:  MRI head 06/30/2011.  CT HEAD  Findings:  There is no evidence for acute infarction, intracranial hemorrhage, mass lesion, hydrocephalus, or extra-axial fluid. There is mild atrophy and chronic microvascular ischemic change. There is no skull fracture.  Mastoids are clear.  Paranasal sinuses discussed below. The appearance is similar to prior MR from 2012.  IMPRESSION: Mild atrophy and small vessel disease.  No acute intracranial findings.  CT MAXILLOFACIAL  Findings:  Multiple facial fractures are present.  The patient has sustained a direct blow to the face  and there is a large amount of subcutaneous emphysema, greater on the right.  The fractures are combination of LeFort I and Le Fort II.  With regard to type I, there is dysjunction of the maxilla, particularly on the right, from the rest of the face.  Fracture extends across both pterygoid plates. The linear fracture across the left maxilla is incomplete.  With regard to [employer] II, there are comminuted nasal bone fractures which extend across the nasal bridge into the orbits. The inferior orbital rim on the right is discontinuous and there is a small right inferior orbital blowout fracture.  The left inferior orbital rim is grossly intact although there may be a small nondisplaced fracture of the left inferior orbital floor.  The nasal cavity is filled with blood and the bony nasal septum is fractured and deviated leftward.  Both maxillary sinuses are filled with blood, greater on the right.  There is abundant sub tarsal air on the right and subcutaneous emphysema dissects downward on both sides of the neck without compromise of the airway.  Air-fluid levels seen in the sphenoid and ethmoid sinuses but there is no visible basilar skull fracture.  Frontal sinuses clear.  No mandibular fracture or TMJ dislocation.  IMPRESSION: Complex facial fractures which represent features of both Lefort I and Le Fort II fractures, greater on the right.  Small inferior orbital blowout fracture on the right.  Bilateral maxillary sinus hemorrhage.  Nasal bone fractures are comminuted.  Right orbital emphysema with subtarsal area and extensive subcutaneous emphysema throughout the neck.  CT CERVICAL SPINE  Findings:   There is no compression fracture or traumatic subluxation.  There is a small nondisplaced fracture of the inferior articular process of C5 on the right (image 52 series 8). This does not result in instability.  Mild reversal of the normal cervical lordotic curve relates to degenerative change.  There is no prevertebral soft tissue swelling.   Multilevel level facet arthropathy is present.  The odontoid is intact.  IMPRESSION: Small nondisplaced fracture of the inferior articular process of C5 on the right.  This  would appear to be a stable fracture. No traumatic subluxation is observed.  Multilevel spondylosis as described.  Discussed with EDP.  Original Report Authenticated By: Elsie Stain, M.D.   Ct Cervical Spine Wo Contrast  04/08/2012  *RADIOLOGY REPORT*  Clinical Data:  Unwitnessed fall.  Head pain.  Neck pain.  Face pain.  CT HEAD WITHOUT CONTRAST CT MAXILLOFACIAL WITHOUT CONTRAST CT CERVICAL SPINE WITHOUT CONTRAST  Technique:  Multidetector CT imaging of the head, cervical spine, and maxillofacial structures were performed using the standard protocol without intravenous contrast. Multiplanar CT image reconstructions of the cervical spine and maxillofacial structures were also generated.  Comparison:  MRI head 06/30/2011.  CT HEAD  Findings:  There is no evidence for acute infarction, intracranial hemorrhage, mass lesion, hydrocephalus, or  extra-axial fluid. There is mild atrophy and chronic microvascular ischemic change. There is no skull fracture.  Mastoids are clear.  Paranasal sinuses discussed below. The appearance is similar to prior MR from 2012.  IMPRESSION: Mild atrophy and small vessel disease.  No acute intracranial findings.  CT MAXILLOFACIAL  Findings:  Multiple facial fractures are present.  The patient has sustained a direct blow to the face and there is a large amount of subcutaneous emphysema, greater on the right.  The fractures are combination of LeFort I and Le Fort II.  With regard to type I, there is dysjunction of  the maxilla, particularly on the right, from the rest of the face.  Fracture extends across both pterygoid plates. The linear fracture across the left maxilla is incomplete.  With regard to [place] II, there are comminuted nasal bone fractures which extend across the nasal bridge into the orbits. The inferior orbital rim on the right is discontinuous and there is a small right inferior orbital blowout fracture.  The left inferior orbital rim is grossly intact although there may be a small nondisplaced fracture of the left inferior orbital floor.  The nasal cavity is filled with blood and the bony nasal septum is fractured and deviated leftward.  Both maxillary sinuses are filled with blood, greater on the right.  There is abundant sub tarsal air on the right and subcutaneous emphysema dissects downward on both sides of the neck without compromise of the airway. Air-fluid levels seen in the sphenoid and ethmoid sinuses but there is no visible basilar skull fracture.  Frontal sinuses clear.  No mandibular fracture or TMJ dislocation.  IMPRESSION: Complex facial fractures which represent features of both Lefort I and Le Fort II fractures, greater on the right.  Small inferior orbital blowout fracture on the right.  Bilateral maxillary sinus hemorrhage.  Nasal bone fractures are comminuted.  Right orbital emphysema with  subtarsal area and extensive subcutaneous emphysema throughout the neck.  CT CERVICAL SPINE  Findings:   There is no compression fracture or traumatic subluxation.  There is a small nondisplaced fracture of the inferior articular process of C5 on the right (image 52 series 8). This does not result in instability.  Mild reversal of the normal cervical lordotic curve relates to degenerative change.  There is no prevertebral soft tissue swelling.   Multilevel level facet arthropathy is present.  The odontoid is intact.  IMPRESSION: Small nondisplaced fracture of the inferior articular process of C5 on the right.  This  would appear to be a stable fracture. No traumatic subluxation is observed.  Multilevel spondylosis as described.  Discussed with EDP.  Original Report Authenticated By: Elsie Stain, M.D.   Ct Maxillofacial Wo Cm  04/08/2012  *RADIOLOGY REPORT*  Clinical Data:  Unwitnessed fall.  Head pain.  Neck pain.  Face pain.  CT HEAD WITHOUT CONTRAST CT MAXILLOFACIAL WITHOUT CONTRAST CT CERVICAL SPINE WITHOUT CONTRAST  Technique:  Multidetector CT imaging of the head, cervical spine, and maxillofacial structures were performed using the standard protocol without intravenous contrast. Multiplanar CT image reconstructions of the cervical spine and maxillofacial structures were also generated.  Comparison:  MRI head 06/30/2011.  CT HEAD  Findings:  There is no evidence for acute infarction, intracranial hemorrhage, mass lesion, hydrocephalus, or extra-axial fluid. There is mild atrophy and chronic microvascular ischemic change. There is no skull fracture.  Mastoids are clear.  Paranasal sinuses discussed below. The appearance is similar to prior MR from 2012.  IMPRESSION: Mild atrophy and small vessel disease.  No acute intracranial findings.  CT MAXILLOFACIAL  Findings:  Multiple facial fractures are present.  The patient has sustained a direct blow to the face and there is a large amount of subcutaneous  emphysema, greater on the right.  The fractures are combination of LeFort I and Le Fort II.  With regard to type I, there is dysjunction of the maxilla, particularly on the right, from the rest of the face.  Fracture extends across both pterygoid plates. The linear fracture across the left maxilla is incomplete.  With regard to [facility] II, there  are comminuted nasal bone fractures which extend across the nasal bridge into the orbits. The inferior orbital rim on the right is discontinuous and there is a small right inferior orbital blowout fracture.  The left inferior orbital rim is grossly intact although there may be a small nondisplaced fracture of the left inferior orbital floor.  The nasal cavity is filled with blood and the bony nasal septum is fractured and deviated leftward.  Both maxillary sinuses are filled with blood, greater on the right.  There is abundant sub tarsal air on the right and subcutaneous emphysema dissects downward on both sides of the neck without compromise of the airway. Air-fluid levels seen in the sphenoid and ethmoid sinuses but there is no visible basilar skull fracture.  Frontal sinuses clear.  No mandibular fracture or TMJ dislocation.  IMPRESSION: Complex facial fractures which represent features of both Lefort I and Le Fort II fractures, greater on the right.  Small inferior orbital blowout fracture on the right.  Bilateral maxillary sinus hemorrhage.  Nasal bone fractures are comminuted.  Right orbital emphysema with subtarsal area and extensive subcutaneous emphysema throughout the neck.  CT CERVICAL SPINE  Findings:   There is no compression fracture or traumatic subluxation.  There is a small nondisplaced fracture of the inferior articular process of C5 on the right (image 52 series 8). This does not result in instability.  Mild reversal of the normal cervical lordotic curve relates to degenerative change.  There is no prevertebral soft tissue swelling.   Multilevel level  facet arthropathy is present.  The odontoid is intact.  IMPRESSION: Small nondisplaced fracture of the inferior articular process of C5 on the right.  This  would appear to be a stable fracture. No traumatic subluxation is observed.  Multilevel spondylosis as described.  Discussed with EDP.  Original Report Authenticated By: Elsie Stain, M.D.    ROS Blood pressure 147/78, pulse 89, temperature 98.8 F (37.1 C), temperature source Oral, resp. rate 16, height 5\' 5"  (1.651 m), weight 69.1 kg (152 lb 5.4 oz), SpO2 99.00%. Physical Exam  HENT:       She has significant facial swelling and ecchymosis. The right eye is edematous but opens easily. There is very minimal conjunctival hemorrhage and the pupils are equally round and reactive to light. Most of the swelling on the right side is subcutaneous emphysema. There does not appear to be any bony step offs of the orbit. There is too much swelling to assess the nasal bones well. There is no septal hematoma. Oral cavity/oropharynx-there is what appears to be normal occlusion. There's a few chipped teeth. The midface by palpation feels like it's solid. She does not feel any movement when she bites down and there is really no significant pain. There is a neck brace in position    Assessment/Plan:  multiple facial fractures-she will need to see a dentist regarding her chipped teeth. She does not seem to have any malocclusion so if she does have a LeFort one that it is not out of position at this time. She did not have any movement of the midface by examination. Fractures are for the most part nondisplaced. The orbital fractures do not seem to be so she'll with any entrapment. The orbital rhythm feels to be in position but the swelling does prevent complete evaluation of this. She does have a lot of swelling so this will need to reduce and she will need a reevaluation and discussion regarding any surgical intervention for  repair of the facial fractures. She  should followup if medically stable and discharged in the office in about 4-5 days. She should stay on a soft diet. No nose blowing.  Suzanna Obey 04/09/2012, 10:56 AM

## 2012-04-09 NOTE — Progress Notes (Signed)
CRITICAL VALUE ALERT  Critical value received:  Serum Osmalarity=0223  Date of notification:  04/09/12  Time of notification:  0322  Critical value read back:yes  Nurse who received alert:  Dustin Flock  MD notified (1st page):  Dr Kendrick Fries   Time of first page:  0325  MD notified (2nd page):  Time of second page:  Responding MD:  Dr Kendrick Fries  Time MD responded:  [id number]

## 2012-04-09 NOTE — Progress Notes (Signed)
eLink Physician-Brief Progress Note Patient Name: Tara Shaffer DOB: 23-Apr-1946 MRN: [id number]  Date of Service  04/09/2012   HPI/Events of Note   Incontinent of urine  eICU Interventions  foley   Intervention Category Intermediate Interventions: Other:  [name] 04/09/2012, 1:56 AM

## 2012-04-09 NOTE — Consult Note (Signed)
Reason for Consult: Closed head injury, C-spine fracture Referring Physician: Violeta Gelinas  Tara Shaffer is an 66 y.o. female.  HPI: Patient is a 66 year old individual who apparently fell forward striking her face on the pavement. She was found on initial evaluation to have a sodium of 106 she was also noted to be slightly disoriented but otherwise talking and moving all extremities. She was evaluated with a CT scan of the head face and neck. The head CT reveals that the intracranial contents are normal. The face CT demonstrates that there is some midface fractures which include a combination of the LeFort I and LeFort II fracture. C-spine CT reveals that there is a right sided facet fracture on the inferior margin of the facet.  Past Medical History  Diagnosis Date  . MELANOMA, HX OF 08/04/2007  . ALLERGIC RHINITIS 06/23/2007  . ANXIETY DEPRESSION 06/12/2009  . ANXIETY 06/23/2007  . 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 GERD 06/23/2007  . HYPERLIPIDEMIA 06/24/2007  . HYPERTENSION 06/23/2007  . COLONIC POLYPS, HX OF 03/08/2002  . DIVERTICULOSIS, COLON 08/04/2007  . Irritable bowel syndrome 06/23/2007  . Memory dysfunction 06/23/2011  . Cancer     skin cx/ hx melanoma  . LOW BACK PAIN 06/24/2007  . Alcohol abuse 02/27/2012    Past Surgical History  Procedure Date  . Cesarean section     x 2  . Colonoscopy   . Appendectomy     Family History  Problem Relation Age of Onset  . Hypertension Other   . Diabetes Other   . Colon cancer Cousin     Social History:  reports that she has never smoked. She has never used smokeless tobacco. She reports that she drinks about 25.2 ounces of alcohol per week. She reports that she does not use illicit drugs.  Allergies:  Allergies  Allergen Reactions  . Codeine Phosphate Nausea Only    Medications: I have reviewed the patient's current medications.  Results for orders placed during the hospital encounter of 04/08/12 (from  the past 48 hour(s))  CBC     Status: Abnormal   Collection Time   04/08/12  4:34 PM      Component Value Range Comment   WBC 14.8 (*) 4.0 - 10.5 K/uL    RBC 3.77 (*) 3.87 - 5.11 MIL/uL    Hemoglobin 12.8  12.0 - 15.0 g/dL    HCT 11.9 (*) 14.7 - 46.0 %    MCV 86.2  78.0 - 100.0 fL    MCH 34.0  26.0 - 34.0 pg    MCHC 39.4 (*) 30.0 - 36.0 g/dL RULED OUT INTERFERING SUBSTANCES   RDW 11.7  11.5 - 15.5 %    Platelets 279  150 - 400 K/uL   COMPREHENSIVE METABOLIC PANEL     Status: Abnormal   Collection Time   04/08/12  4:34 PM      Component Value Range Comment   Sodium 106 (*) 135 - 145 mEq/L    Potassium 2.8 (*) 3.5 - 5.1 mEq/L    Chloride 67 (*) 96 - 112 mEq/L    CO2 25  19 - 32 mEq/L    Glucose, Bld 132 (*) 70 - 99 mg/dL    BUN 6  6 - 23 mg/dL    Creatinine, Ser 8.29  0.50 - 1.10 mg/dL    Calcium 9.1  8.4 - 56.2 mg/dL    Total Protein 7.1  6.0 - 8.3 g/dL    Albumin 4.0  3.5 - 5.2 g/dL    AST 47 (*) 0 - 37 U/L    ALT 42 (*) 0 - 35 U/L    Alkaline Phosphatase 105  39 - 117 U/L    Total Bilirubin 0.9  0.3 - 1.2 mg/dL    GFR calc non Af Amer >90  >90 mL/min    GFR calc Af Amer >90  >90 mL/min   PROTIME-INR     Status: Normal   Collection Time   04/08/12  4:34 PM      Component Value Range Comment   Prothrombin Time 12.4  11.6 - 15.2 seconds    INR 0.91  0.00 - 1.49   URINALYSIS, ROUTINE W REFLEX MICROSCOPIC     Status: Abnormal   Collection Time   04/08/12  8:04 PM      Component Value Range Comment   Color, Urine YELLOW  YELLOW    APPearance CLEAR  CLEAR    Specific Gravity, Urine 1.013  1.005 - 1.030    pH 7.5  5.0 - 8.0    Glucose, UA 100 (*) NEGATIVE mg/dL    Hgb urine dipstick NEGATIVE  NEGATIVE    Bilirubin Urine NEGATIVE  NEGATIVE    Ketones, ur 40 (*) NEGATIVE mg/dL    Protein, ur NEGATIVE  NEGATIVE mg/dL    Urobilinogen, UA 1.0  0.0 - 1.0 mg/dL    Nitrite NEGATIVE  NEGATIVE    Leukocytes, UA NEGATIVE  NEGATIVE MICROSCOPIC NOT DONE ON URINES WITH NEGATIVE PROTEIN,  BLOOD, LEUKOCYTES, NITRITE, OR GLUCOSE <1000 mg/dL.  MRSA PCR SCREENING     Status: Normal   Collection Time   04/08/12 10:04 PM      Component Value Range Comment   MRSA by PCR NEGATIVE  NEGATIVE   BASIC METABOLIC PANEL     Status: Abnormal   Collection Time   04/09/12  1:00 AM      Component Value Range Comment   Sodium 109 (*) 135 - 145 mEq/L    Potassium 2.7 (*) 3.5 - 5.1 mEq/L    Chloride 76 (*) 96 - 112 mEq/L    CO2 21  19 - 32 mEq/L    Glucose, Bld 111 (*) 70 - 99 mg/dL    BUN 6  6 - 23 mg/dL    Creatinine, Ser 7.82 (*) 0.50 - 1.10 mg/dL    Calcium 8.2 (*) 8.4 - 10.5 mg/dL    GFR calc non Af Amer >90  >90 mL/min    GFR calc Af Amer >90  >90 mL/min   CBC     Status: Abnormal   Collection Time   04/09/12  1:00 AM      Component Value Range Comment   WBC 13.1 (*) 4.0 - 10.5 K/uL    RBC 3.62 (*) 3.87 - 5.11 MIL/uL    Hemoglobin 12.0  12.0 - 15.0 g/dL    HCT 95.6 (*) 21.3 - 46.0 %    MCV 86.2  78.0 - 100.0 fL    MCH 33.1  26.0 - 34.0 pg    MCHC 38.5 (*) 30.0 - 36.0 g/dL RULED OUT INTERFERING SUBSTANCES   RDW 12.1  11.5 - 15.5 %    Platelets 235  150 - 400 K/uL   PROTIME-INR     Status: Normal   Collection Time   04/09/12  1:00 AM      Component Value Range Comment   Prothrombin Time 14.0  11.6 - 15.2 seconds    INR  1.06  0.00 - 1.49   OSMOLALITY     Status: Abnormal   Collection Time   04/09/12  1:00 AM      Component Value Range Comment   Osmolality 223 (*) 275 - 300 mOsm/kg   OSMOLALITY, URINE     Status: Abnormal   Collection Time   04/09/12  2:05 AM      Component Value Range Comment   Osmolality, Ur 221 (*) 390 - 1090 mOsm/kg     Dg Chest 2 View  04/08/2012  *RADIOLOGY REPORT*  Clinical Data: Unwitnessed fall.  High blood pressure.  CHEST - 2 VIEW  Comparison: 02/09/2011 and 08/20/2008.  Findings: Granuloma peripheral right lung stable.  Scarring left costophrenic angle region unchanged.  Tortuous aorta.  Cardiomegaly.  Central pulmonary vascular prominence.  No  segmental infiltrate or pneumothorax.  No obvious fracture.  IMPRESSION: Granuloma peripheral right lung stable.  Scarring left costophrenic angle region unchanged.  Tortuous aorta.  Cardiomegaly.  Central pulmonary vascular prominence.  No segmental infiltrate or pneumothorax.  No obvious fracture.  Original Report Authenticated By: Fuller Canada, M.D.   Dg Thoracic Spine 2 View  04/09/2012  *RADIOLOGY REPORT*  Clinical Data: Trauma.  Rule out fracture. C5 fracture.  THORACIC SPINE - 2 VIEW  Comparison: CT cervical spine 04/08/2012  Findings: Extensive subcutaneous emphysema about the neck, presumably from facial and sinus fractures.  The frontal view demonstrates normal paraspinous contours.  The lateral view images from approximately the top of T3 through T12.  Maintenance of vertebral body height across these levels. The swimmer's view has obscured cervical thoracic junction, but T1 and T2 are within normal limits on the prior CT.  IMPRESSION: No acute findings in the thoracic spine.  Subcutaneous emphysema, presumably from facial fractures.  Original Report Authenticated By: Consuello Bossier, M.D.   Dg Lumbar Spine 2-3 Views  04/09/2012  *RADIOLOGY REPORT*  Clinical Data: C5 fracture.  Recent trauma.  LUMBAR SPINE - 2-3 VIEW  Comparison: None.  Findings: Five lumbar type vertebral bodies.   Sacroiliac joints are symmetric.  Maintenance of vertebral body height and alignment. Degenerative disc disease at L4-L5.  IMPRESSION: Spondylosis, without acute finding.  Original Report Authenticated By: Consuello Bossier, M.D.   Ct Head Wo Contrast  04/08/2012  *RADIOLOGY REPORT*  Clinical Data:  Unwitnessed fall.  Head pain.  Neck pain.  Face pain.  CT HEAD WITHOUT CONTRAST CT MAXILLOFACIAL WITHOUT CONTRAST CT CERVICAL SPINE WITHOUT CONTRAST  Technique:  Multidetector CT imaging of the head, cervical spine, and maxillofacial structures were performed using the standard protocol without intravenous contrast.  Multiplanar CT image reconstructions of the cervical spine and maxillofacial structures were also generated.  Comparison:  MRI head 06/30/2011.  CT HEAD  Findings:  There is no evidence for acute infarction, intracranial hemorrhage, mass lesion, hydrocephalus, or extra-axial fluid. There is mild atrophy and chronic microvascular ischemic change. There is no skull fracture.  Mastoids are clear.  Paranasal sinuses discussed below. The appearance is similar to prior MR from 2012.  IMPRESSION: Mild atrophy and small vessel disease.  No acute intracranial findings.  CT MAXILLOFACIAL  Findings:  Multiple facial fractures are present.  The patient has sustained a direct blow to the face and there is a large amount of subcutaneous emphysema, greater on the right.  The fractures are combination of LeFort I and Le Fort II.  With regard to type I, there is dysjunction of the maxilla, particularly on the right, from the rest  of the face.  Fracture extends across both pterygoid plates. The linear fracture across the left maxilla is incomplete.  With regard to [facility] II, there are comminuted nasal bone fractures which extend across the nasal bridge into the orbits. The inferior orbital rim on the right is discontinuous and there is a small right inferior orbital blowout fracture.  The left inferior orbital rim is grossly intact although there may be a small nondisplaced fracture of the left inferior orbital floor.  The nasal cavity is filled with blood and the bony nasal septum is fractured and deviated leftward.  Both maxillary sinuses are filled with blood, greater on the right.  There is abundant sub tarsal air on the right and subcutaneous emphysema dissects downward on both sides of the neck without compromise of the airway. Air-fluid levels seen in the sphenoid and ethmoid sinuses but there is no visible basilar skull fracture.  Frontal sinuses clear.  No mandibular fracture or TMJ dislocation.  IMPRESSION: Complex facial  fractures which represent features of both Lefort I and Le Fort II fractures, greater on the right.  Small inferior orbital blowout fracture on the right.  Bilateral maxillary sinus hemorrhage.  Nasal bone fractures are comminuted.  Right orbital emphysema with subtarsal area and extensive subcutaneous emphysema throughout the neck.  CT CERVICAL SPINE  Findings:   There is no compression fracture or traumatic subluxation.  There is a small nondisplaced fracture of the inferior articular process of C5 on the right (image 52 series 8). This does not result in instability.  Mild reversal of the normal cervical lordotic curve relates to degenerative change.  There is no prevertebral soft tissue swelling.   Multilevel level facet arthropathy is present.  The odontoid is intact.  IMPRESSION: Small nondisplaced fracture of the inferior articular process of C5 on the right.  This  would appear to be a stable fracture. No traumatic subluxation is observed.  Multilevel spondylosis as described.  Discussed with EDP.  Original Report Authenticated By: Elsie Stain, M.D.   Ct Cervical Spine Wo Contrast  04/08/2012  *RADIOLOGY REPORT*  Clinical Data:  Unwitnessed fall.  Head pain.  Neck pain.  Face pain.  CT HEAD WITHOUT CONTRAST CT MAXILLOFACIAL WITHOUT CONTRAST CT CERVICAL SPINE WITHOUT CONTRAST  Technique:  Multidetector CT imaging of the head, cervical spine, and maxillofacial structures were performed using the standard protocol without intravenous contrast. Multiplanar CT image reconstructions of the cervical spine and maxillofacial structures were also generated.  Comparison:  MRI head 06/30/2011.  CT HEAD  Findings:  There is no evidence for acute infarction, intracranial hemorrhage, mass lesion, hydrocephalus, or extra-axial fluid. There is mild atrophy and chronic microvascular ischemic change. There is no skull fracture.  Mastoids are clear.  Paranasal sinuses discussed below. The appearance is similar to prior MR  from 2012.  IMPRESSION: Mild atrophy and small vessel disease.  No acute intracranial findings.  CT MAXILLOFACIAL  Findings:  Multiple facial fractures are present.  The patient has sustained a direct blow to the face and there is a large amount of subcutaneous emphysema, greater on the right.  The fractures are combination of LeFort I and Le Fort II.  With regard to type I, there is dysjunction of the maxilla, particularly on the right, from the rest of the face.  Fracture extends across both pterygoid plates. The linear fracture across the left maxilla is incomplete.  With regard to [facility] II, there are comminuted nasal bone fractures which extend across  the nasal bridge into the orbits. The inferior orbital rim on the right is discontinuous and there is a small right inferior orbital blowout fracture.  The left inferior orbital rim is grossly intact although there may be a small nondisplaced fracture of the left inferior orbital floor.  The nasal cavity is filled with blood and the bony nasal septum is fractured and deviated leftward.  Both maxillary sinuses are filled with blood, greater on the right.  There is abundant sub tarsal air on the right and subcutaneous emphysema dissects downward on both sides of the neck without compromise of the airway. Air-fluid levels seen in the sphenoid and ethmoid sinuses but there is no visible basilar skull fracture.  Frontal sinuses clear.  No mandibular fracture or TMJ dislocation.  IMPRESSION: Complex facial fractures which represent features of both Lefort I and Le Fort II fractures, greater on the right.  Small inferior orbital blowout fracture on the right.  Bilateral maxillary sinus hemorrhage.  Nasal bone fractures are comminuted.  Right orbital emphysema with subtarsal area and extensive subcutaneous emphysema throughout the neck.  CT CERVICAL SPINE  Findings:   There is no compression fracture or traumatic subluxation.  There is a small nondisplaced fracture of  the inferior articular process of C5 on the right (image 52 series 8). This does not result in instability.  Mild reversal of the normal cervical lordotic curve relates to degenerative change.  There is no prevertebral soft tissue swelling.   Multilevel level facet arthropathy is present.  The odontoid is intact.  IMPRESSION: Small nondisplaced fracture of the inferior articular process of C5 on the right.  This  would appear to be a stable fracture. No traumatic subluxation is observed.  Multilevel spondylosis as described.  Discussed with EDP.  Original Report Authenticated By: Elsie Stain, M.D.   Ct Maxillofacial Wo Cm  04/08/2012  *RADIOLOGY REPORT*  Clinical Data:  Unwitnessed fall.  Head pain.  Neck pain.  Face pain.  CT HEAD WITHOUT CONTRAST CT MAXILLOFACIAL WITHOUT CONTRAST CT CERVICAL SPINE WITHOUT CONTRAST  Technique:  Multidetector CT imaging of the head, cervical spine, and maxillofacial structures were performed using the standard protocol without intravenous contrast. Multiplanar CT image reconstructions of the cervical spine and maxillofacial structures were also generated.  Comparison:  MRI head 06/30/2011.  CT HEAD  Findings:  There is no evidence for acute infarction, intracranial hemorrhage, mass lesion, hydrocephalus, or extra-axial fluid. There is mild atrophy and chronic microvascular ischemic change. There is no skull fracture.  Mastoids are clear.  Paranasal sinuses discussed below. The appearance is similar to prior MR from 2012.  IMPRESSION: Mild atrophy and small vessel disease.  No acute intracranial findings.  CT MAXILLOFACIAL  Findings:  Multiple facial fractures are present.  The patient has sustained a direct blow to the face and there is a large amount of subcutaneous emphysema, greater on the right.  The fractures are combination of LeFort I and Le Fort II.  With regard to type I, there is dysjunction of the maxilla, particularly on the right, from the rest of the face.   Fracture extends across both pterygoid plates. The linear fracture across the left maxilla is incomplete.  With regard to [facility] II, there are comminuted nasal bone fractures which extend across the nasal bridge into the orbits. The inferior orbital rim on the right is discontinuous and there is a small right inferior orbital blowout fracture.  The left inferior orbital rim is grossly intact although there  may be a small nondisplaced fracture of the left inferior orbital floor.  The nasal cavity is filled with blood and the bony nasal septum is fractured and deviated leftward.  Both maxillary sinuses are filled with blood, greater on the right.  There is abundant sub tarsal air on the right and subcutaneous emphysema dissects downward on both sides of the neck without compromise of the airway. Air-fluid levels seen in the sphenoid and ethmoid sinuses but there is no visible basilar skull fracture.  Frontal sinuses clear.  No mandibular fracture or TMJ dislocation.  IMPRESSION: Complex facial fractures which represent features of both Lefort I and Le Fort II fractures, greater on the right.  Small inferior orbital blowout fracture on the right.  Bilateral maxillary sinus hemorrhage.  Nasal bone fractures are comminuted.  Right orbital emphysema with subtarsal area and extensive subcutaneous emphysema throughout the neck.  CT CERVICAL SPINE  Findings:   There is no compression fracture or traumatic subluxation.  There is a small nondisplaced fracture of the inferior articular process of C5 on the right (image 52 series 8). This does not result in instability.  Mild reversal of the normal cervical lordotic curve relates to degenerative change.  There is no prevertebral soft tissue swelling.   Multilevel level facet arthropathy is present.  The odontoid is intact.  IMPRESSION: Small nondisplaced fracture of the inferior articular process of C5 on the right.  This  would appear to be a stable fracture. No traumatic  subluxation is observed.  Multilevel spondylosis as described.  Discussed with EDP.  Original Report Authenticated By: Elsie Stain, M.D.    Review of Systems  Unable to perform ROS: mental acuity   Blood pressure 147/78, pulse 89, temperature 98.8 F (37.1 C), temperature source Oral, resp. rate 16, height 5\' 5"  (1.651 m), weight 69.1 kg (152 lb 5.4 oz), SpO2 99.00%. Physical Exam  Constitutional: She is oriented to person, place, and time. She appears well-developed and well-nourished.  HENT:       Marked facial ecchymoses and edema worse on the right side than on left. right eye is swollen shut.  Eyes:       Left pupils 3 mm and reactive right pupil cannot be assessed.  Neck:       In [place] collar.  Cardiovascular: Normal rate and regular rhythm.   Respiratory: Effort normal and breath sounds normal.  GI: Soft. Bowel sounds are normal.  Musculoskeletal: Normal range of motion.  Neurological: She is alert and oriented to person, place, and time.       Not assess fully for cranial nerve defect secondary to marked facial edema and ecchymosis  Skin: Skin is warm and dry.  Psychiatric: She has a normal mood and affect. Her behavior is normal.    Assessment/Plan:  patient has a nondisplaced fracture of C5 and the inferior portion of the facet. This is not an unstable fracture. Maintaining hard cervical collar is primarily for comfort measures . Patient can be removed from the collar in order to undergo general anesthesia if repair of the facial fractures are necessary. Etiology of her hyponatremia is interesting but I do not believe it is related to trauma as there is no signs of any substantial intracranial trauma on this patient. I'll gladly follow this patient up as an outpatient for her C-spine injury. I suggest a followup visit in 2-3 weeks.  Shevawn Langenberg J 04/09/2012, 10:56 AM

## 2012-04-09 NOTE — Progress Notes (Signed)
2106 received critical value for Na 114. Dr. Dr. Marin Shutter notified. No new orders for Na level at this time. Will continue to monitor  Patients K 3.1. Received orders for k replacement po. Will continue to monitor

## 2012-04-09 NOTE — Progress Notes (Signed)
PCCM Follow up  Chronic EtOH use, hyponatremia improving >> will slow down the NS so that we do not correct too quickly Replacing K Repeat BMP at 20:00  Trauma and ENT following >> will need to ask them when C-collar can come off  Levy Pupa, MD, PhD 04/09/2012, 3:47 PM [facility] [phone] or if no answer [phone]

## 2012-04-09 NOTE — H&P (Signed)
Name: ELENIE COVEN MRN: [id number] DOB: [birth year]  DOS:   04/09/12   CRITICAL CARE MEDICINE ADMISSION / CONSULTATION NOTE  Chief complaints: Maxillofacial trauma, cervical trauma and electrolyte imbalance.  History Of Present Illness: The patient is a 65/F with multiple medical problem who sustained a fall. The patient was walking with her husband while she suddenly fell. Since she was walking behind her husband, the husband does not know how she fell (? Missed step etc). Pt. Did not have any prodromal event like chest pain, sob, headache etc.   This is the first time she ever passed out. No history of arrhythmia, syncopal event in the past or seizure disorder.  At [facility] she had trauma scan, which showed that she had multiple maxillofacial and cervical injury and was found to have hyponatremia and hence transferred to [facility] ICU.  Past Medical History  Diagnosis Date  . MELANOMA, HX OF 08/04/2007  . ALLERGIC RHINITIS 06/23/2007  . ANXIETY DEPRESSION 06/12/2009  . ANXIETY 06/23/2007  . 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 GERD 06/23/2007  . HYPERLIPIDEMIA 06/24/2007  . HYPERTENSION 06/23/2007  . COLONIC POLYPS, HX OF 03/08/2002  . DIVERTICULOSIS, COLON 08/04/2007  . Irritable bowel syndrome 06/23/2007  . Memory dysfunction 06/23/2011  . Cancer     skin cx/ hx melanoma  . LOW BACK PAIN 06/24/2007  . Alcohol abuse 02/27/2012    Past Surgical History  Procedure Date  . Cesarean section     x 2  . Colonoscopy   . Appendectomy     Prior to Admission medications   Medication Sig Start Date End Date Taking? Authorizing Provider  amLODipine (NORVASC) 10 MG tablet Take 10 mg by mouth daily.     Yes Historical Provider, MD  benazepril-hydrochlorthiazide (LOTENSIN HCT) 20-12.5 MG per tablet Take 2 tablets by mouth daily.   Yes Historical Provider, MD  cephALEXin (KEFLEX) 500 MG capsule Take 500 mg by mouth 4 (four) times daily.   Yes Historical Provider, MD    citalopram (CELEXA) 40 MG tablet Take 1 tablet (40 mg total) by mouth daily. 11/29/11 11/28/12 Yes Corwin Levins, MD  clonazePAM (KLONOPIN) 0.5 MG tablet Take 1 tablet (0.5 mg total) by mouth 2 (two) times daily as needed for anxiety. 02/02/12 04/05/18 Yes Corwin Levins, MD  dexlansoprazole (DEXILANT) 60 MG capsule Take 1 capsule (60 mg total) by mouth daily. 03/21/12 04/20/12 Yes Corwin Levins, MD  potassium chloride (KLOR-CON 10) 10 MEQ tablet Take 1 tablet (10 mEq total) by mouth 2 (two) times daily. 03/17/12 03/17/13 Yes Corwin Levins, MD  zolpidem (AMBIEN CR) 6.25 MG CR tablet Take 6.25 mg by mouth at bedtime as needed. For sleep   Yes Historical Provider, MD    Allergies  Allergen Reactions  . Codeine Phosphate Nausea Only    Family History  Problem Relation Age of Onset  . Hypertension Other   . Diabetes Other   . Colon cancer Cousin     Social History  reports that she has never smoked. She has never used smokeless tobacco. She reports that she drinks about 25.2 ounces of alcohol per week. She reports that she does not use illicit drugs.  Review Of Systems  11 points review of systems is negative with an exception of listed in HPI.  Physical Examination  BP 154/138  Pulse 91  Temp 97.9 F (36.6 C) (Oral)  Resp 16  Ht 5\' 5"  (1.651 m)  Wt 70.4 kg (155 lb  3.3 oz)  BMI 25.83 kg/m2  SpO2 99%  Neuro:  AAOX3 HEENT:  Right eye occluded with swelling of eyelids, cervical collar present. Multiple bruises on the face. Heart:  RRR, no M/R/G Lungs:  Bilateral air entry, no W/R/R Abdomen:  Soft, non tender, bowel sounds present Extremities:  Bruise + on the knee.  Labs  Results for orders placed during the hospital encounter of 04/08/12 (from the past 24 hour(s))  CBC     Status: Abnormal   Collection Time   04/08/12  4:34 PM      Component Value Range   WBC 14.8 (*) 4.0 - 10.5 K/uL   RBC 3.77 (*) 3.87 - 5.11 MIL/uL   Hemoglobin 12.8  12.0 - 15.0 g/dL   HCT 78.2 (*) 95.6 - 21.3 %    MCV 86.2  78.0 - 100.0 fL   MCH 34.0  26.0 - 34.0 pg   MCHC 39.4 (*) 30.0 - 36.0 g/dL   RDW 08.6  57.8 - 46.9 %   Platelets 279  150 - 400 K/uL  COMPREHENSIVE METABOLIC PANEL     Status: Abnormal   Collection Time   04/08/12  4:34 PM      Component Value Range   Sodium 106 (*) 135 - 145 mEq/L   Potassium 2.8 (*) 3.5 - 5.1 mEq/L   Chloride 67 (*) 96 - 112 mEq/L   CO2 25  19 - 32 mEq/L   Glucose, Bld 132 (*) 70 - 99 mg/dL   BUN 6  6 - 23 mg/dL   Creatinine, Ser 6.29  0.50 - 1.10 mg/dL   Calcium 9.1  8.4 - 52.8 mg/dL   Total Protein 7.1  6.0 - 8.3 g/dL   Albumin 4.0  3.5 - 5.2 g/dL   AST 47 (*) 0 - 37 U/L   ALT 42 (*) 0 - 35 U/L   Alkaline Phosphatase 105  39 - 117 U/L   Total Bilirubin 0.9  0.3 - 1.2 mg/dL   GFR calc non Af Amer >90  >90 mL/min   GFR calc Af Amer >90  >90 mL/min  PROTIME-INR     Status: Normal   Collection Time   04/08/12  4:34 PM      Component Value Range   Prothrombin Time 12.4  11.6 - 15.2 seconds   INR 0.91  0.00 - 1.49  URINALYSIS, ROUTINE W REFLEX MICROSCOPIC     Status: Abnormal   Collection Time   04/08/12  8:04 PM      Component Value Range   Color, Urine YELLOW  YELLOW   APPearance CLEAR  CLEAR   Specific Gravity, Urine 1.013  1.005 - 1.030   pH 7.5  5.0 - 8.0   Glucose, UA 100 (*) NEGATIVE mg/dL   Hgb urine dipstick NEGATIVE  NEGATIVE   Bilirubin Urine NEGATIVE  NEGATIVE   Ketones, ur 40 (*) NEGATIVE mg/dL   Protein, ur NEGATIVE  NEGATIVE mg/dL   Urobilinogen, UA 1.0  0.0 - 1.0 mg/dL   Nitrite NEGATIVE  NEGATIVE   Leukocytes, UA NEGATIVE  NEGATIVE  MRSA PCR SCREENING     Status: Normal   Collection Time   04/08/12 10:04 PM      Component Value Range   MRSA by PCR NEGATIVE  NEGATIVE    Imaging  Dg Chest 2 View  04/08/2012  *RADIOLOGY REPORT*  Clinical Data: Unwitnessed fall.  High blood pressure.  CHEST - 2 VIEW  Comparison: 02/09/2011 and 08/20/2008.  Findings:  Granuloma peripheral right lung stable.  Scarring left costophrenic angle  region unchanged.  Tortuous aorta.  Cardiomegaly.  Central pulmonary vascular prominence.  No segmental infiltrate or pneumothorax.  No obvious fracture.  IMPRESSION: Granuloma peripheral right lung stable.  Scarring left costophrenic angle region unchanged.  Tortuous aorta.  Cardiomegaly.  Central pulmonary vascular prominence.  No segmental infiltrate or pneumothorax.  No obvious fracture.  Original Report Authenticated By: Fuller Canada, M.D.   Ct Head Wo Contrast  04/08/2012  *RADIOLOGY REPORT*  Clinical Data:  Unwitnessed fall.  Head pain.  Neck pain.  Face pain.  CT HEAD WITHOUT CONTRAST CT MAXILLOFACIAL WITHOUT CONTRAST CT CERVICAL SPINE WITHOUT CONTRAST  Technique:  Multidetector CT imaging of the head, cervical spine, and maxillofacial structures were performed using the standard protocol without intravenous contrast. Multiplanar CT image reconstructions of the cervical spine and maxillofacial structures were also generated.  Comparison:  MRI head 06/30/2011.  CT HEAD  Findings:  There is no evidence for acute infarction, intracranial hemorrhage, mass lesion, hydrocephalus, or extra-axial fluid. There is mild atrophy and chronic microvascular ischemic change. There is no skull fracture.  Mastoids are clear.  Paranasal sinuses discussed below. The appearance is similar to prior MR from 2012.  IMPRESSION: Mild atrophy and small vessel disease.  No acute intracranial findings.  CT MAXILLOFACIAL  Findings:  Multiple facial fractures are present.  The patient has sustained a direct blow to the face and there is a large amount of subcutaneous emphysema, greater on the right.  The fractures are combination of LeFort I and Le Fort II.  With regard to type I, there is dysjunction of the maxilla, particularly on the right, from the rest of the face.  Fracture extends across both pterygoid plates. The linear fracture across the left maxilla is incomplete.  With regard to [facility] II, there are comminuted nasal  bone fractures which extend across the nasal bridge into the orbits. The inferior orbital rim on the right is discontinuous and there is a small right inferior orbital blowout fracture.  The left inferior orbital rim is grossly intact although there may be a small nondisplaced fracture of the left inferior orbital floor.  The nasal cavity is filled with blood and the bony nasal septum is fractured and deviated leftward.  Both maxillary sinuses are filled with blood, greater on the right.  There is abundant sub tarsal air on the right and subcutaneous emphysema dissects downward on both sides of the neck without compromise of the airway. Air-fluid levels seen in the sphenoid and ethmoid sinuses but there is no visible basilar skull fracture.  Frontal sinuses clear.  No mandibular fracture or TMJ dislocation.  IMPRESSION: Complex facial fractures which represent features of both Lefort I and Le [place] II fractures, greater on the right.  Small inferior orbital blowout fracture on the right.  Bilateral maxillary sinus hemorrhage.  Nasal bone fractures are comminuted.  Right orbital emphysema with subtarsal area and extensive subcutaneous emphysema throughout the neck.  CT CERVICAL SPINE  Findings:   There is no compression fracture or traumatic subluxation.  There is a small nondisplaced fracture of the inferior articular process of C5 on the right (image 52 series 8). This does not result in instability.  Mild reversal of the normal cervical lordotic curve relates to degenerative change.  There is no prevertebral soft tissue swelling.   Multilevel level facet arthropathy is present.  The odontoid is intact.  IMPRESSION: Small nondisplaced fracture of the inferior articular  process of C5 on the right.  This  would appear to be a stable fracture. No traumatic subluxation is observed.  Multilevel spondylosis as described.  Discussed with EDP.  Original Report Authenticated By: Elsie Stain, M.D.   Ct Cervical Spine Wo  Contrast  04/08/2012  *RADIOLOGY REPORT*  Clinical Data:  Unwitnessed fall.  Head pain.  Neck pain.  Face pain.  CT HEAD WITHOUT CONTRAST CT MAXILLOFACIAL WITHOUT CONTRAST CT CERVICAL SPINE WITHOUT CONTRAST  Technique:  Multidetector CT imaging of the head, cervical spine, and maxillofacial structures were performed using the standard protocol without intravenous contrast. Multiplanar CT image reconstructions of the cervical spine and maxillofacial structures were also generated.  Comparison:  MRI head 06/30/2011.  CT HEAD  Findings:  There is no evidence for acute infarction, intracranial hemorrhage, mass lesion, hydrocephalus, or extra-axial fluid. There is mild atrophy and chronic microvascular ischemic change. There is no skull fracture.  Mastoids are clear.  Paranasal sinuses discussed below. The appearance is similar to prior MR from 2012.  IMPRESSION: Mild atrophy and small vessel disease.  No acute intracranial findings.  CT MAXILLOFACIAL  Findings:  Multiple facial fractures are present.  The patient has sustained a direct blow to the face and there is a large amount of subcutaneous emphysema, greater on the right.  The fractures are combination of LeFort I and Le Fort II.  With regard to type I, there is dysjunction of the maxilla, particularly on the right, from the rest of the face.  Fracture extends across both pterygoid plates. The linear fracture across the left maxilla is incomplete.  With regard to [facility] II, there are comminuted nasal bone fractures which extend across the nasal bridge into the orbits. The inferior orbital rim on the right is discontinuous and there is a small right inferior orbital blowout fracture.  The left inferior orbital rim is grossly intact although there may be a small nondisplaced fracture of the left inferior orbital floor.  The nasal cavity is filled with blood and the bony nasal septum is fractured and deviated leftward.  Both maxillary sinuses are filled with blood,  greater on the right.  There is abundant sub tarsal air on the right and subcutaneous emphysema dissects downward on both sides of the neck without compromise of the airway. Air-fluid levels seen in the sphenoid and ethmoid sinuses but there is no visible basilar skull fracture.  Frontal sinuses clear.  No mandibular fracture or TMJ dislocation.  IMPRESSION: Complex facial fractures which represent features of both Lefort I and Le Fort II fractures, greater on the right.  Small inferior orbital blowout fracture on the right.  Bilateral maxillary sinus hemorrhage.  Nasal bone fractures are comminuted.  Right orbital emphysema with subtarsal area and extensive subcutaneous emphysema throughout the neck.  CT CERVICAL SPINE  Findings:   There is no compression fracture or traumatic subluxation.  There is a small nondisplaced fracture of the inferior articular process of C5 on the right (image 52 series 8). This does not result in instability.  Mild reversal of the normal cervical lordotic curve relates to degenerative change.  There is no prevertebral soft tissue swelling.   Multilevel level facet arthropathy is present.  The odontoid is intact.  IMPRESSION: Small nondisplaced fracture of the inferior articular process of C5 on the right.  This  would appear to be a stable fracture. No traumatic subluxation is observed.  Multilevel spondylosis as described.  Discussed with EDP.  Original Report Authenticated By:  Elsie Stain, M.D.   Ct Maxillofacial Wo Cm  04/08/2012  *RADIOLOGY REPORT*  Clinical Data:  Unwitnessed fall.  Head pain.  Neck pain.  Face pain.  CT HEAD WITHOUT CONTRAST CT MAXILLOFACIAL WITHOUT CONTRAST CT CERVICAL SPINE WITHOUT CONTRAST  Technique:  Multidetector CT imaging of the head, cervical spine, and maxillofacial structures were performed using the standard protocol without intravenous contrast. Multiplanar CT image reconstructions of the cervical spine and maxillofacial structures were also  generated.  Comparison:  MRI head 06/30/2011.  CT HEAD  Findings:  There is no evidence for acute infarction, intracranial hemorrhage, mass lesion, hydrocephalus, or extra-axial fluid. There is mild atrophy and chronic microvascular ischemic change. There is no skull fracture.  Mastoids are clear.  Paranasal sinuses discussed below. The appearance is similar to prior MR from 2012.  IMPRESSION: Mild atrophy and small vessel disease.  No acute intracranial findings.  CT MAXILLOFACIAL  Findings:  Multiple facial fractures are present.  The patient has sustained a direct blow to the face and there is a large amount of subcutaneous emphysema, greater on the right.  The fractures are combination of LeFort I and Le Fort II.  With regard to type I, there is dysjunction of the maxilla, particularly on the right, from the rest of the face.  Fracture extends across both pterygoid plates. The linear fracture across the left maxilla is incomplete.  With regard to [facility] II, there are comminuted nasal bone fractures which extend across the nasal bridge into the orbits. The inferior orbital rim on the right is discontinuous and there is a small right inferior orbital blowout fracture.  The left inferior orbital rim is grossly intact although there may be a small nondisplaced fracture of the left inferior orbital floor.  The nasal cavity is filled with blood and the bony nasal septum is fractured and deviated leftward.  Both maxillary sinuses are filled with blood, greater on the right.  There is abundant sub tarsal air on the right and subcutaneous emphysema dissects downward on both sides of the neck without compromise of the airway. Air-fluid levels seen in the sphenoid and ethmoid sinuses but there is no visible basilar skull fracture.  Frontal sinuses clear.  No mandibular fracture or TMJ dislocation.  IMPRESSION: Complex facial fractures which represent features of both Lefort I and [place] Fort II fractures, greater on the  right.  Small inferior orbital blowout fracture on the right.  Bilateral maxillary sinus hemorrhage.  Nasal bone fractures are comminuted.  Right orbital emphysema with subtarsal area and extensive subcutaneous emphysema throughout the neck.  CT CERVICAL SPINE  Findings:   There is no compression fracture or traumatic subluxation.  There is a small nondisplaced fracture of the inferior articular process of C5 on the right (image 52 series 8). This does not result in instability.  Mild reversal of the normal cervical lordotic curve relates to degenerative change.  There is no prevertebral soft tissue swelling.   Multilevel level facet arthropathy is present.  The odontoid is intact.  IMPRESSION: Small nondisplaced fracture of the inferior articular process of C5 on the right.  This  would appear to be a stable fracture. No traumatic subluxation is observed.  Multilevel spondylosis as described.  Discussed with EDP.  Original Report Authenticated By: Elsie Stain, M.D.    Assessment The patient is a 65/F with multiple medical problem including maxillofacial and cervical injury admitted to ICU for severe hyponatremia.  1. Electrolyte imbalance: - Pt has hyponatremia,  hypochloremia and hypokalemia. - Last blood draw was at 4 pm (8 hours back) and since that time patient has received 285ml/hour of normal saline and 2 runs of Potassium chloride (10 meq each). 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 - Will check the serum and urine osmolality. - Will replace Na slowly (based on most recent Na level).  2. Syncope: - Will keep the patient in ICU - Will evaluate 24 hours EKG stripe for any arrhythmia. - Will get echo.  3. Maxillofacial and Neck injury: - Already consulted Surgery (Dr Janee Morn).  4. DVT prophylaxis.   Catha Brow, MD 04/09/2012, 12:13 AM

## 2012-04-09 NOTE — Consult Note (Addendum)
Reason for Consult:Facial and cervical spine fractures Referring Physician: Tannisha Kennington is an 66 y.o. female.  HPI: Patient has a history of progressive neurologic decline over the past 6-12 months. According to her husband who is her caregiver, she has had increasing difficulty both with short-term memory loss and gait disturbance with frequent falls. She has undergone some neurologic workup which so far has been unrevealing. Earlier today, her husband went out to walk the dog. When he returned he found her face down in the garage after apparently suffering a fall. No apparent loss of consciousness.She was evaluated at [facility] And found to have a sodium of 106. She was accepted in transfer at [facility] by the critical care medicine service. Workup revealed multiple facial fractures as well as a C5 cervical spine fracture. I was asked to see her from a trauma standpoint. She is unable to provide history due to her neurologic status but her husband assists with history.  Past Medical History  Diagnosis Date  . MELANOMA, HX OF 08/04/2007  . ALLERGIC RHINITIS 06/23/2007  . ANXIETY DEPRESSION 06/12/2009  . ANXIETY 06/23/2007  . 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 GERD 06/23/2007  . HYPERLIPIDEMIA 06/24/2007  . HYPERTENSION 06/23/2007  . COLONIC POLYPS, HX OF 03/08/2002  . DIVERTICULOSIS, COLON 08/04/2007  . Irritable bowel syndrome 06/23/2007  . Memory dysfunction 06/23/2011  . Cancer     skin cx/ hx melanoma  . LOW BACK PAIN 06/24/2007  . Alcohol abuse 02/27/2012    Past Surgical History  Procedure Date  . Cesarean section     x 2  . Colonoscopy   . Appendectomy     Family History  Problem Relation Age of Onset  . Hypertension Other   . Diabetes Other   . Colon cancer Cousin     Social History: No smoking history, drinks one bottle of wine daily  Allergies:  Allergies  Allergen Reactions  . Codeine Phosphate Nausea  Only    Medications: I have reviewed the patient's current medications.  Results for orders placed during the hospital encounter of 04/08/12 (from the past 48 hour(s))  CBC     Status: Abnormal   Collection Time   04/08/12  4:34 PM      Component Value Range Comment   WBC 14.8 (*) 4.0 - 10.5 K/uL    RBC 3.77 (*) 3.87 - 5.11 MIL/uL    Hemoglobin 12.8  12.0 - 15.0 g/dL    HCT 29.5 (*) 62.1 - 46.0 %    MCV 86.2  78.0 - 100.0 fL    MCH 34.0  26.0 - 34.0 pg    MCHC 39.4 (*) 30.0 - 36.0 g/dL RULED OUT INTERFERING SUBSTANCES   RDW 11.7  11.5 - 15.5 %    Platelets 279  150 - 400 K/uL   COMPREHENSIVE METABOLIC PANEL     Status: Abnormal   Collection Time   04/08/12  4:34 PM      Component Value Range Comment   Sodium 106 (*) 135 - 145 mEq/L    Potassium 2.8 (*) 3.5 - 5.1 mEq/L    Chloride 67 (*) 96 - 112 mEq/L    CO2 25  19 - 32 mEq/L    Glucose, Bld 132 (*) 70 - 99 mg/dL    BUN 6  6 - 23 mg/dL    Creatinine, Ser 3.08  0.50 - 1.10 mg/dL    Calcium 9.1  8.4 -  10.5 mg/dL    Total Protein 7.1  6.0 - 8.3 g/dL    Albumin 4.0  3.5 - 5.2 g/dL    AST 47 (*) 0 - 37 U/L    ALT 42 (*) 0 - 35 U/L    Alkaline Phosphatase 105  39 - 117 U/L    Total Bilirubin 0.9  0.3 - 1.2 mg/dL    GFR calc non Af Amer >90  >90 mL/min    GFR calc Af Amer >90  >90 mL/min   PROTIME-INR     Status: Normal   Collection Time   04/08/12  4:34 PM      Component Value Range Comment   Prothrombin Time 12.4  11.6 - 15.2 seconds    INR 0.91  0.00 - 1.49   URINALYSIS, ROUTINE W REFLEX MICROSCOPIC     Status: Abnormal   Collection Time   04/08/12  8:04 PM      Component Value Range Comment   Color, Urine YELLOW  YELLOW    APPearance CLEAR  CLEAR    Specific Gravity, Urine 1.013  1.005 - 1.030    pH 7.5  5.0 - 8.0    Glucose, UA 100 (*) NEGATIVE mg/dL    Hgb urine dipstick NEGATIVE  NEGATIVE    Bilirubin Urine NEGATIVE  NEGATIVE    Ketones, ur 40 (*) NEGATIVE mg/dL    Protein, ur NEGATIVE  NEGATIVE mg/dL     Urobilinogen, UA 1.0  0.0 - 1.0 mg/dL    Nitrite NEGATIVE  NEGATIVE    Leukocytes, UA NEGATIVE  NEGATIVE MICROSCOPIC NOT DONE ON URINES WITH NEGATIVE PROTEIN, BLOOD, LEUKOCYTES, NITRITE, OR GLUCOSE <1000 mg/dL.  MRSA PCR SCREENING     Status: Normal   Collection Time   04/08/12 10:04 PM      Component Value Range Comment   MRSA by PCR NEGATIVE  NEGATIVE     Dg Chest 2 View  04/08/2012  *RADIOLOGY REPORT*  Clinical Data: Unwitnessed fall.  High blood pressure.  CHEST - 2 VIEW  Comparison: 02/09/2011 and 08/20/2008.  Findings: Granuloma peripheral right lung stable.  Scarring left costophrenic angle region unchanged.  Tortuous aorta.  Cardiomegaly.  Central pulmonary vascular prominence.  No segmental infiltrate or pneumothorax.  No obvious fracture.  IMPRESSION: Granuloma peripheral right lung stable.  Scarring left costophrenic angle region unchanged.  Tortuous aorta.  Cardiomegaly.  Central pulmonary vascular prominence.  No segmental infiltrate or pneumothorax.  No obvious fracture.  Original Report Authenticated By: Fuller Canada, M.D.   Ct Head Wo Contrast  04/08/2012  *RADIOLOGY REPORT*  Clinical Data:  Unwitnessed fall.  Head pain.  Neck pain.  Face pain.  CT HEAD WITHOUT CONTRAST CT MAXILLOFACIAL WITHOUT CONTRAST CT CERVICAL SPINE WITHOUT CONTRAST  Technique:  Multidetector CT imaging of the head, cervical spine, and maxillofacial structures were performed using the standard protocol without intravenous contrast. Multiplanar CT image reconstructions of the cervical spine and maxillofacial structures were also generated.  Comparison:  MRI head 06/30/2011.  CT HEAD  Findings:  There is no evidence for acute infarction, intracranial hemorrhage, mass lesion, hydrocephalus, or extra-axial fluid. There is mild atrophy and chronic microvascular ischemic change. There is no skull fracture.  Mastoids are clear.  Paranasal sinuses discussed below. The appearance is similar to prior MR from 2012.   IMPRESSION: Mild atrophy and small vessel disease.  No acute intracranial findings.  CT MAXILLOFACIAL  Findings:  Multiple facial fractures are present.  The patient has sustained a direct  blow to the face and there is a large amount of subcutaneous emphysema, greater on the right.  The fractures are combination of LeFort I and Le Fort II.  With regard to type I, there is dysjunction of the maxilla, particularly on the right, from the rest of the face.  Fracture extends across both pterygoid plates. The linear fracture across the left maxilla is incomplete.  With regard to [facility] II, there are comminuted nasal bone fractures which extend across the nasal bridge into the orbits. The inferior orbital rim on the right is discontinuous and there is a small right inferior orbital blowout fracture.  The left inferior orbital rim is grossly intact although there may be a small nondisplaced fracture of the left inferior orbital floor.  The nasal cavity is filled with blood and the bony nasal septum is fractured and deviated leftward.  Both maxillary sinuses are filled with blood, greater on the right.  There is abundant sub tarsal air on the right and subcutaneous emphysema dissects downward on both sides of the neck without compromise of the airway. Air-fluid levels seen in the sphenoid and ethmoid sinuses but there is no visible basilar skull fracture.  Frontal sinuses clear.  No mandibular fracture or TMJ dislocation.  IMPRESSION: Complex facial fractures which represent features of both Lefort I and Le Fort II fractures, greater on the right.  Small inferior orbital blowout fracture on the right.  Bilateral maxillary sinus hemorrhage.  Nasal bone fractures are comminuted.  Right orbital emphysema with subtarsal area and extensive subcutaneous emphysema throughout the neck.  CT CERVICAL SPINE  Findings:   There is no compression fracture or traumatic subluxation.  There is a small nondisplaced fracture of the inferior  articular process of C5 on the right (image 52 series 8). This does not result in instability.  Mild reversal of the normal cervical lordotic curve relates to degenerative change.  There is no prevertebral soft tissue swelling.   Multilevel level facet arthropathy is present.  The odontoid is intact.  IMPRESSION: Small nondisplaced fracture of the inferior articular process of C5 on the right.  This  would appear to be a stable fracture. No traumatic subluxation is observed.  Multilevel spondylosis as described.  Discussed with EDP.  Original Report Authenticated By: Elsie Stain, M.D.   Ct Cervical Spine Wo Contrast  04/08/2012  *RADIOLOGY REPORT*  Clinical Data:  Unwitnessed fall.  Head pain.  Neck pain.  Face pain.  CT HEAD WITHOUT CONTRAST CT MAXILLOFACIAL WITHOUT CONTRAST CT CERVICAL SPINE WITHOUT CONTRAST  Technique:  Multidetector CT imaging of the head, cervical spine, and maxillofacial structures were performed using the standard protocol without intravenous contrast. Multiplanar CT image reconstructions of the cervical spine and maxillofacial structures were also generated.  Comparison:  MRI head 06/30/2011.  CT HEAD  Findings:  There is no evidence for acute infarction, intracranial hemorrhage, mass lesion, hydrocephalus, or extra-axial fluid. There is mild atrophy and chronic microvascular ischemic change. There is no skull fracture.  Mastoids are clear.  Paranasal sinuses discussed below. The appearance is similar to prior MR from 2012.  IMPRESSION: Mild atrophy and small vessel disease.  No acute intracranial findings.  CT MAXILLOFACIAL  Findings:  Multiple facial fractures are present.  The patient has sustained a direct blow to the face and there is a large amount of subcutaneous emphysema, greater on the right.  The fractures are combination of LeFort I and Le Fort II.  With regard to type I, there  is dysjunction of the maxilla, particularly on the right, from the rest of the face.  Fracture  extends across both pterygoid plates. The linear fracture across the left maxilla is incomplete.  With regard to [facility] II, there are comminuted nasal bone fractures which extend across the nasal bridge into the orbits. The inferior orbital rim on the right is discontinuous and there is a small right inferior orbital blowout fracture.  The left inferior orbital rim is grossly intact although there may be a small nondisplaced fracture of the left inferior orbital floor.  The nasal cavity is filled with blood and the bony nasal septum is fractured and deviated leftward.  Both maxillary sinuses are filled with blood, greater on the right.  There is abundant sub tarsal air on the right and subcutaneous emphysema dissects downward on both sides of the neck without compromise of the airway. Air-fluid levels seen in the sphenoid and ethmoid sinuses but there is no visible basilar skull fracture.  Frontal sinuses clear.  No mandibular fracture or TMJ dislocation.  IMPRESSION: Complex facial fractures which represent features of both Lefort I and Le Fort II fractures, greater on the right.  Small inferior orbital blowout fracture on the right.  Bilateral maxillary sinus hemorrhage.  Nasal bone fractures are comminuted.  Right orbital emphysema with subtarsal area and extensive subcutaneous emphysema throughout the neck.  CT CERVICAL SPINE  Findings:   There is no compression fracture or traumatic subluxation.  There is a small nondisplaced fracture of the inferior articular process of C5 on the right (image 52 series 8). This does not result in instability.  Mild reversal of the normal cervical lordotic curve relates to degenerative change.  There is no prevertebral soft tissue swelling.   Multilevel level facet arthropathy is present.  The odontoid is intact.  IMPRESSION: Small nondisplaced fracture of the inferior articular process of C5 on the right.  This  would appear to be a stable fracture. No traumatic subluxation  is observed.  Multilevel spondylosis as described.  Discussed with EDP.  Original Report Authenticated By: Elsie Stain, M.D.   Ct Maxillofacial Wo Cm  04/08/2012  *RADIOLOGY REPORT*  Clinical Data:  Unwitnessed fall.  Head pain.  Neck pain.  Face pain.  CT HEAD WITHOUT CONTRAST CT MAXILLOFACIAL WITHOUT CONTRAST CT CERVICAL SPINE WITHOUT CONTRAST  Technique:  Multidetector CT imaging of the head, cervical spine, and maxillofacial structures were performed using the standard protocol without intravenous contrast. Multiplanar CT image reconstructions of the cervical spine and maxillofacial structures were also generated.  Comparison:  MRI head 06/30/2011.  CT HEAD  Findings:  There is no evidence for acute infarction, intracranial hemorrhage, mass lesion, hydrocephalus, or extra-axial fluid. There is mild atrophy and chronic microvascular ischemic change. There is no skull fracture.  Mastoids are clear.  Paranasal sinuses discussed below. The appearance is similar to prior MR from 2012.  IMPRESSION: Mild atrophy and small vessel disease.  No acute intracranial findings.  CT MAXILLOFACIAL  Findings:  Multiple facial fractures are present.  The patient has sustained a direct blow to the face and there is a large amount of subcutaneous emphysema, greater on the right.  The fractures are combination of LeFort I and Le Fort II.  With regard to type I, there is dysjunction of the maxilla, particularly on the right, from the rest of the face.  Fracture extends across both pterygoid plates. The linear fracture across the left maxilla is incomplete.  With regard to [facility]  Fort II, there are comminuted nasal bone fractures which extend across the nasal bridge into the orbits. The inferior orbital rim on the right is discontinuous and there is a small right inferior orbital blowout fracture.  The left inferior orbital rim is grossly intact although there may be a small nondisplaced fracture of the left inferior orbital floor.   The nasal cavity is filled with blood and the bony nasal septum is fractured and deviated leftward.  Both maxillary sinuses are filled with blood, greater on the right.  There is abundant sub tarsal air on the right and subcutaneous emphysema dissects downward on both sides of the neck without compromise of the airway. Air-fluid levels seen in the sphenoid and ethmoid sinuses but there is no visible basilar skull fracture.  Frontal sinuses clear.  No mandibular fracture or TMJ dislocation.  IMPRESSION: Complex facial fractures which represent features of both Lefort I and Le Fort II fractures, greater on the right.  Small inferior orbital blowout fracture on the right.  Bilateral maxillary sinus hemorrhage.  Nasal bone fractures are comminuted.  Right orbital emphysema with subtarsal area and extensive subcutaneous emphysema throughout the neck.  CT CERVICAL SPINE  Findings:   There is no compression fracture or traumatic subluxation.  There is a small nondisplaced fracture of the inferior articular process of C5 on the right (image 52 series 8). This does not result in instability.  Mild reversal of the normal cervical lordotic curve relates to degenerative change.  There is no prevertebral soft tissue swelling.   Multilevel level facet arthropathy is present.  The odontoid is intact.  IMPRESSION: Small nondisplaced fracture of the inferior articular process of C5 on the right.  This  would appear to be a stable fracture. No traumatic subluxation is observed.  Multilevel spondylosis as described.  Discussed with EDP.  Original Report Authenticated By: Elsie Stain, M.D.    Review of Systems  Unable to perform ROS: mental status change   Blood pressure 173/90, pulse 91, temperature 97.9 F (36.6 C), temperature source Oral, resp. rate 20, height 5\' 5"  (1.651 m), weight 70.4 kg (155 lb 3.3 oz), SpO2 99.00%. Physical Exam  Constitutional: She appears well-developed. No distress.       Chronically ill  appearing  HENT:  Head: Head is with contusion, with right periorbital erythema and with left periorbital erythema.         Significant facial contusions, some midface instability, significant bilateral periorbital edema right greater than left  Eyes: EOM are normal. Pupils are equal, round, and reactive to light. Right conjunctiva is injected. Right conjunctiva has no hemorrhage. Left conjunctiva is injected. Left conjunctiva has no hemorrhage.  Neck: No tracheal deviation present.       No posterior midline tenderness, cervical collar left in place  Cardiovascular: Normal rate, regular rhythm, normal heart sounds and intact distal pulses.   Respiratory: Effort normal and breath sounds normal. No stridor. No respiratory distress. She has no wheezes. She has no rales.       old evolving contusion right medial breast  GI: Soft. Bowel sounds are normal. She exhibits no distension. There is no tenderness. There is no rebound and no guarding.       Old contusion left hip area  Musculoskeletal:       Significant contusions of varying ages right hand and bilateral knees, no bony deformity in the extremities, no significant tenderness  Neurological: She is alert. She has normal strength. She displays tremor. She  exhibits normal muscle tone. She displays no seizure activity. GCS eye subscore is 3. GCS verbal subscore is 5. GCS motor subscore is 6.       Resting tremor bilateral upper extremities  Skin: Skin is warm.       Multiple contusions as above    Assessment/Plan: Patient with chronic alcohol abuse and deteriorating neurologic status over the past several months now status post fall with multiple significant facial fractures including LeFort I and LeFort II as well as C5 fracture.  I spoke with Dr. Jearld Fenton from ENT and he will see her later today regarding her facial fractures. I also spoke with Dr. Danielle Dess from neurosurgery and he will see her later today regarding the C5 fracture. I will change  her to an Aspen collar. Will obtain thoracic and lumbar spine films in the morning to rule out concomitant fracture. Hyponatremia is severe and is being addressed by critical care medicine. This likely has a lot to do with her mental status. Fortunately she is not seizing. We will follow with you.  Alexis Mizuno E 04/09/2012, 12:52 AM

## 2012-04-09 NOTE — Progress Notes (Signed)
CRITICAL VALUE ALERT  Critical value received: Na 114  Date of notification:  04/09/12  Time of notification: 2106  Critical value read back:yes  Nurse who received alert: Earney Mallet, RN  MD notified (1st page):  Dr. Marin Shutter   Time of first page:  2106  MD notified (2nd page):  Time of second page:  Responding MD:Dr. Marin Shutter  Time MD responded:  2106

## 2012-04-09 NOTE — Progress Notes (Signed)
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

## 2012-04-09 NOTE — Progress Notes (Signed)
CRITICAL VALUE ALERT  Critical value received:  Na= 109, K=2.7  Date of notification:  04/09/12  Time of notification: 0211  Critical value read back:yes  Nurse who received alert:  Melissa Batts/Chris Madilyn Fireman  MD notified (1st page):  Dr Kendrick Fries  Time of first page:  0219  MD notified (2nd page):  Time of second page:  Responding MD:  Dr Kendrick Fries   Time MD responded:  [phone]

## 2012-04-10 DIAGNOSIS — E871 Hypo-osmolality and hyponatremia: Secondary | ICD-10-CM | POA: Diagnosis present

## 2012-04-10 DIAGNOSIS — IMO0002 Reserved for concepts with insufficient information to code with codable children: Secondary | ICD-10-CM | POA: Diagnosis present

## 2012-04-10 DIAGNOSIS — S0292XA Unspecified fracture of facial bones, initial encounter for closed fracture: Secondary | ICD-10-CM | POA: Diagnosis present

## 2012-04-10 DIAGNOSIS — S0280XA Fracture of other specified skull and facial bones, unspecified side, initial encounter for closed fracture: Secondary | ICD-10-CM

## 2012-04-10 DIAGNOSIS — S129XXA Fracture of neck, unspecified, initial encounter: Secondary | ICD-10-CM

## 2012-04-10 LAB — BASIC METABOLIC PANEL
BUN: 4 mg/dL — ABNORMAL LOW (ref 6–23)
CO2: 16 mEq/L — ABNORMAL LOW (ref 19–32)
Calcium: 8.7 mg/dL (ref 8.4–10.5)
Chloride: 81 mEq/L — ABNORMAL LOW (ref 96–112)
Creatinine, Ser: 0.49 mg/dL — ABNORMAL LOW (ref 0.50–1.10)
GFR calc Af Amer: >90 mL/min (ref >90)
GFR calc Af Amer: >90 mL/min (ref >90)
GFR calc non Af Amer: >90 mL/min (ref >90)
Glucose, Bld: 71 mg/dL (ref 70–99)
Potassium: 3.5 mEq/L (ref 3.5–5.1)
Sodium: 113 mEq/L — CL (ref 135–145)

## 2012-04-10 LAB — CBC
MCH: 33.9 pg (ref 26.0–34.0)
Platelets: 216 10*3/uL (ref 150–400)
RBC: 3.66 MIL/uL — ABNORMAL LOW (ref 3.87–5.11)
RDW: 12.1 % (ref 11.5–15.5)

## 2012-04-10 MED ORDER — BOOST / RESOURCE BREEZE PO LIQD
1.0000 | Freq: Three times a day (TID) | ORAL | Status: DC
  Administered 2012-04-10 (×2): 1 via ORAL

## 2012-04-10 MED ORDER — POTASSIUM CHLORIDE CRYS ER 20 MEQ PO TBCR
40.0000 meq | EXTENDED_RELEASE_TABLET | ORAL | Status: AC
  Administered 2012-04-10 – 2012-04-11 (×2): 40 meq via ORAL
  Filled 2012-04-10 (×2): qty 2

## 2012-04-10 NOTE — Progress Notes (Signed)
CRITICAL VALUE ALERT  Critical value received:  Na 116  Date of notification: 04/10/12  Time of notification:0615  Critical value read back:yes  Nurse who received alert:  Genia Del; Notified C flewellen RN  MD notified (1st page): Molli Knock  Time of first page:  0615  MD notified (2nd page):  Time of second page:  Responding MD:  Molli Knock  Time MD responded: [phone]

## 2012-04-10 NOTE — Progress Notes (Signed)
Critical call lab value called to eLink. Na 112. NNOs

## 2012-04-10 NOTE — Progress Notes (Signed)
Name: Tara Shaffer MRN: [id number] DOB: 08/12/1946    LOS: 2  Referring Provider:  EDP Reason for Referral:  Symptomatic hyponatremia  PULMONARY / CRITICAL CARE MEDICINE   Brief patient description:  66 yo admitted after fall resulting in multiple maxillofacial and cervical injuries who found to be hyponatremic.  Events Since Admission: 7/14  Admitted after fall.  Found to be hyponatremic 7/15  C-collar removed  Current Status: Resting comfortably. No pain complaints  Vital Signs: Temp:  [97.6 F (36.4 C)-98.5 F (36.9 C)] 97.6 F (36.4 C) (07/15 0821) Pulse Rate:  [63-91] 79  (07/15 0800) Resp:  [12-26] 18  (07/15 0800) BP: (100-164)/(52-101) 131/64 mmHg (07/15 0800) SpO2:  [96 %-100 %] 100 % (07/15 0800) Weight:  [153 lb 10.6 oz (69.7 kg)] 153 lb 10.6 oz (69.7 kg) (07/15 0500)  Physical Examination: General:  NAD, AAO Neuro:  Not fully assessed due to  Physical trauma HEENT:  C-collar in place, facial swelling in places of apparent trauma, espicially around the R eye Cardiovascular:  RRR. No m/r/g Lungs:  CTAB, normal effort Abdomen:  NABS, soft, nontender Musculoskeletal:  Knees w/ bruises bilat.  Skin:  Numerous facial abraisions. W/ echymoses  Active Problems:  Hyponatremia  Facial fracture  Spinal fracture  ASSESSMENT AND PLAN  PULMONARY No results found for this basename: PHART:5,PCO2:5,PCO2ART:5,PO2ART:5,HCO3:5,O2SAT:5 in the last 168 hours Ventilator Settings:  NA   CXR:  NA ETT:  NA  A:  No active issues P:   No interventions required  CARDIOVASCULAR No results found for this basename: TROPONINI:5,LATICACIDVEN:5, O2SATVEN:5,PROBNP:5 in the last 168 hours  ECG:  7/13 >>> NSR Lines: NA  A: Hemodynamically stable.  No arrhythmia / ischemia. History of HTN. P:  Amlodipine as preadmission  RENAL  Lab 04/10/12 0410 04/09/12 2011 04/09/12 0100 04/08/12 1634  NA 116* 114* 109* 106*  K 3.9 3.1* -- --  CL 86* 80* 76* 67*  CO2 16* 20 21  25   BUN 4* 3* 6 6  CREATININE 0.49* 0.44* 0.43* 0.50  CALCIUM 8.7 8.5 8.2* 9.1  MG -- -- -- --  PHOS -- -- -- --   Intake/Output      07/14 0701 - 07/15 0700 07/15 0701 - 07/16 0700   I.V. (mL/kg) 1350 (19.4)    IV Piggyback 700    Total Intake(mL/kg) 2050 (29.4)    Urine (mL/kg/hr) 2575 (1.5)    Total Output 2575    Net -525          Foley:  7/14 >>>  A:  Hyponatremia, improving.  Normal renal function.  Likely dehydration. P:   Continue NS Trend BMP q6h  GASTROINTESTINAL  Lab 04/08/12 1634  AST 47*  ALT 42*  ALKPHOS 105  BILITOT 0.9  PROT 7.1  ALBUMIN 4.0   A:  No acute issues. P:   No intervention required  HEMATOLOGIC  Lab 04/10/12 0526 04/09/12 0100 04/08/12 1634  HGB 12.4 12.0 12.8  HCT 32.3* 31.2* 32.5*  PLT 216 235 279  INR -- 1.06 0.91  APTT -- -- --   A:  No active issues P:  Trend CBC  INFECTIOUS  Lab 04/10/12 0526 04/09/12 0100 04/08/12 1634  WBC 7.4 13.1* 14.8*  PROCALCITON -- -- --   Cultures: None  Antibiotics: None   A: No signs of infection. Reactive leukocytosis. P:   No intervention required  ENDOCRINE No results found for this basename: GLUCAP:5 in the last 168 hours  A:  No acute issues P:  No intervention required  NEUROLOGIC  Head / face /cspine CT:  7/13 >>> No acute intracranial findings, complex facial fractures, nondisplaced fracture of right inferior articular process of C5.  A:  No evidence of neurological deficits at this time though difficult to assess. Fall etiology unknown. Cardiogenic vs neurological vs electrolyte abnormality.  P:   No surgical intervention required per Neurosurgery Surgery signed off  SW assesment for abuse PT/OT Celexa, Clonazepam  BEST PRACTICE / DISPOSITION Level of Care:  ICU Primary Service:  PCCM Consultants:  Neurosurgery Code Status:  Full Diet:  Clear liquid, ADAT DVT Px:  SCD GI Px:  Not indicated Skin Integrity:  Healing skin abrasions Social / Family:   Updated husband 7/15  MERRELL, DAVID, M.D. Family Medicine PGY-2  04/10/2012, 8:39 AM  Patient examined.  Records reviewed.  Assessment and plan above is edited and discussed with ICU Resident Team.  Orlean Bradford, M.D., F.C.C.P. Pulmonary and Critical Care Medicine [facility] Cell: [phone] Pager: [phone]

## 2012-04-10 NOTE — Evaluation (Signed)
Physical Therapy Evaluation Patient Details Name: Tara Shaffer MRN: [id number] DOB: [birth year] Today's Date: 04/10/2012 Time: 4098-1191 PT Time Calculation (min): 19 min  PT Assessment / Plan / Recommendation Clinical Impression  Pt adm with cervical and facial fractures after fall at home.  Pt with shuffling gait which puts her at high risk for repeat falls.  Pt wants to go home but she could benefit from ST-SNF to address high risk mobility.  Husband not present so option of ST-SNF not presented to him.     PT Assessment  Patient needs continued PT services    Follow Up Recommendations  Skilled nursing facility    Barriers to Discharge        Equipment Recommendations  Defer to next venue    Recommendations for Other Services     Frequency Min 3X/week    Precautions / Restrictions Precautions Precautions: Fall   Pertinent Vitals/Pain VSS      Mobility  Bed Mobility Bed Mobility: Supine to Sit;Sitting - Scoot to Edge of Bed Supine to Sit: 3: Mod assist;HOB elevated Sitting - Scoot to Edge of Bed: 3: Mod assist Details for Bed Mobility Assistance: verbal cues for technique Transfers Transfers: Sit to Stand;Stand to Sit;Stand Pivot Transfers Sit to Stand: From bed;With upper extremity assist;With armrests;From chair/3-in-1;4: Min assist Stand to Sit: 4: Min assist;To chair/3-in-1 (didn't use chair armrests despite verbal cues.) Stand Pivot Transfers: 4: Min assist Details for Transfer Assistance: Bed to chair transfer with short shuffling steps. Ambulation/Gait Ambulation/Gait Assistance: 4: Min assist Ambulation Distance (Feet): 100 Feet Assistive device: Rolling walker Ambulation/Gait Assistance Details: verbal/tactile cues for steering and to stay closer to walker. Gait Pattern: Step-through pattern;Decreased stride length;Trunk flexed;Shuffle Gait velocity: slow cadence.    Exercises     PT Diagnosis: Difficulty walking;Abnormality of gait;Generalized  weakness  PT Problem List: Decreased strength;Decreased activity tolerance;Decreased balance;Decreased mobility;Decreased knowledge of precautions;Decreased knowledge of use of DME;Decreased cognition PT Treatment Interventions: DME instruction;Gait training;Stair training;Functional mobility training;Therapeutic activities;Patient/family education;Therapeutic exercise;Balance training   PT Goals Acute Rehab PT Goals PT Goal Formulation: With patient Time For Goal Achievement: 04/24/12 Potential to Achieve Goals: Good Pt will go Supine/Side to Sit: with supervision PT Goal: Supine/Side to Sit - Progress: Goal set today Pt will go Sit to Supine/Side: with supervision PT Goal: Sit to Supine/Side - Progress: Goal set today Pt will go Sit to Stand: with supervision PT Goal: Sit to Stand - Progress: Goal set today Pt will go Stand to Sit: with supervision PT Goal: Stand to Sit - Progress: Goal set today Pt will Ambulate: 51 - 150 feet;with supervision;with least restrictive assistive device PT Goal: Ambulate - Progress: Goal set today Pt will Go Up / Down Stairs: 6-9 stairs;with min assist;with least restrictive assistive device PT Goal: Up/Down Stairs - Progress: Goal set today  Visit Information  Last PT Received On: 04/10/12 Assistance Needed: +1    Subjective Data  Subjective: "Not that I remember," pt stated when asked if she had prior falls at home. Patient Stated Goal: Go home   Prior Functioning  Home Living Lives With: Spouse Available Help at Discharge: Family;Available PRN/intermittently (most of time) Type of Home: House Home Access: Stairs to enter [employer] of Steps: 2 Entrance Stairs-Rails: None Home Layout: Two level;1/2 bath on main level;Bed/bath upstairs Alternate Level Stairs-Number of Steps: 1 flight Alternate Level Stairs-Rails: Right Bathroom Shower/Tub: Health visitor: Standard Home Adaptive Equipment: None Prior  Function Level of Independence: Independent (husband does the cooking)  Able to Take Stairs?: Yes Driving: No Vocation: Retired Musician: No difficulties    Cognition  Overall Cognitive Status: Impaired Area of Impairment: Memory;Awareness of deficits Arousal/Alertness: Awake/alert Orientation Level: Appears intact for tasks assessed Behavior During Session: Flat affect Memory Deficits: Denies prior falls at home but per nsg husband reports frequent falls at home. Awareness of Deficits: Pt steering walker to the left and no attempt to correct.    Extremity/Trunk Assessment Right Lower Extremity Assessment RLE ROM/Strength/Tone: Deficits RLE ROM/Strength/Tone Deficits: grossly 4/5 Left Lower Extremity Assessment LLE ROM/Strength/Tone: Deficits LLE ROM/Strength/Tone Deficits: grossly 4/5   Balance Static Sitting Balance Static Sitting - Balance Support: Bilateral upper extremity supported (on walker) Static Sitting - Level of Assistance: 4: Min assist  End of Session PT - End of Session Equipment Utilized During Treatment: Gait belt Activity Tolerance: Patient tolerated treatment well Patient left: in chair;with call bell/phone within reach;Other (comment) (sitter present) Nurse Communication: Mobility status  GP     Philana Younis 04/10/2012, 2:27 PM  [facility] PT [phone]

## 2012-04-10 NOTE — Progress Notes (Signed)
INITIAL ADULT NUTRITION ASSESSMENT Date: 04/10/2012   Time: 11:08 AM  Reason for Assessment: MD Consult for assessment of nutrition requirements/status  INTERVENTION:  Resource Breeze po TID, each supplement provides 250 kcal and 9 grams of protein.  Consider advance diet to Full Liquids to increase calories and protein provided (Full Liquids include pudding, milk, ice cream, strained soups, etc. that contain more calories and protein than clear liquids, but still should require no chewing)   ASSESSMENT: Female 66 y.o.  Dx: Maxillofacial trauma, cervical trauma and electrolyte imbalance; S/P fall onto face.  Hx:  Past Medical History  Diagnosis Date  . MELANOMA, HX OF 08/04/2007  . ALLERGIC RHINITIS 06/23/2007  . ANXIETY DEPRESSION 06/12/2009  . ANXIETY 06/23/2007  . 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 GERD 06/23/2007  . HYPERLIPIDEMIA 06/24/2007  . HYPERTENSION 06/23/2007  . COLONIC POLYPS, HX OF 03/08/2002  . DIVERTICULOSIS, COLON 08/04/2007  . Irritable bowel syndrome 06/23/2007  . Memory dysfunction 06/23/2011  . Cancer     skin cx/ hx melanoma  . LOW BACK PAIN 06/24/2007  . Alcohol abuse 02/27/2012   Past Surgical History  Procedure Date  . Cesarean section     x 2  . Colonoscopy   . Appendectomy     Related Meds:  Scheduled Meds:   . amLODipine  10 mg Oral Daily  . citalopram  40 mg Oral Daily  . pantoprazole  40 mg Oral Q1200  . pneumococcal 23 valent vaccine  0.5 mL Intramuscular Tomorrow-1000  . potassium chloride  10 mEq Intravenous Q1 Hr x 4  . potassium chloride  10 mEq Intravenous Q1 Hr x 4  . DISCONTD: potassium chloride  40 mEq Oral Once   Continuous Infusions:   . sodium chloride 50 mL/hr at 04/10/12 0800   PRN Meds:.sodium chloride, clonazePAM   Ht: 5\' 5"  (165.1 cm)  Wt: 153 lb 10.6 oz (69.7 kg)  Ideal Wt: 56.8 kg % Ideal Wt: 123%  Wt Readings from Last 8 Encounters:  04/10/12 153 lb 10.6 oz (69.7 kg)  03/21/12 152 lb 4 oz  (69.06 kg)  03/16/12 155 lb 8 oz (70.534 kg)  03/06/12 155 lb (70.308 kg)  02/23/12 156 lb 8 oz (70.988 kg)  11/05/11 147 lb (66.679 kg)  09/17/11 150 lb (68.04 kg)  08/05/11 142 lb (64.411 kg)    Usual Wt: 156 lb  % Usual Wt: 98%  Body mass index is 25.57 kg/(m^2).  Food/Nutrition Related Hx:   Labs:  CMP     Component Value Date/Time   NA 116* 04/10/2012 0410   K 3.9 04/10/2012 0410   CL 86* 04/10/2012 0410   CO2 16* 04/10/2012 0410   GLUCOSE 71 04/10/2012 0410   GLUCOSE 106* 07/14/2006 0803   BUN 4* 04/10/2012 0410   CREATININE 0.49* 04/10/2012 0410   CALCIUM 8.7 04/10/2012 0410   PROT 7.1 04/08/2012 1634   ALBUMIN 4.0 04/08/2012 1634   AST 47* 04/08/2012 1634   ALT 42* 04/08/2012 1634   ALKPHOS 105 04/08/2012 1634   BILITOT 0.9 04/08/2012 1634   GFRNONAA >90 04/10/2012 0410   GFRAA >90 04/10/2012 0410     Sodium  Date/Time Value Range Status  04/10/2012  4:10 AM 116* 135 - 145 mEq/L Final     REPEATED TO VERIFY     CRITICAL RESULT CALLED TO, READ BACK BY AND VERIFIED WITH:     Genia Del RN [id number] EBANKS COLCLOUGH, S  04/09/2012  8:11 PM 114* 135 -  145 mEq/L Final     REPEATED TO VERIFY     CRITICAL RESULT CALLED TO, READ BACK BY AND VERIFIED WITH:     Orlie Pollen RN [id number] EBANKS COLCLOUGH, S  04/09/2012  6:40 AM 113* 135 - 145 mEq/L Final     CRITICAL RESULT CALLED TO, READ BACK BY AND VERIFIED WITH:     E.SMITH,RN [id number] 04/09/12 EHOWARD     Intake/Output Summary (Last 24 hours) at 04/10/12 1124 Last data filed at 04/10/12 0900  Gross per 24 hour  Intake   1890 ml  Output   1600 ml  Net    290 ml     Diet Order: Clear Liquids (to prevent any chewing)  Supplements/Tube Feeding: None  IVF:    sodium chloride Last Rate: 50 mL/hr at 04/10/12 0800    Estimated Nutritional Needs:   Kcal: 1600-1800 Protein: 82-97 grams Fluid: 1.6-1.8 liters  Patient with multiple facial fractures; per Surgery note, swelling needs to be reduced, then re-evaluate  for possible surgical intervention for repair of the facial fractures.    Patient was sleeping during RD visit.  Husband reports that intake over the past few months has been minimal.  Patient only ate 1/2 cup of soup the day before admission.  She likes to drink wine.  No weight loss noted per husband.    Patient is at nutrition risk given poor intake PTA and current facial fractures that will limit intake.  Patient with increased protein needs to support healing.  Will be difficult to consume adequate amounts of calories and protein with current facial fractures and inability to chew.    NUTRITION DIAGNOSIS: -Inadequate oral intake (NI-2.1).  Status: Ongoing  RELATED TO: facial fractures and inability to chew  AS EVIDENCED BY: Clear Liquid diet and reported poor intake PTA  MONITORING/EVALUATION(Goals):  Goal:  Intake to meet 90-100% of estimated nutrition needs.  Monitor:  PO intake, diet advancement, labs, weight trend.  EDUCATION NEEDS: -Education not appropriate at this time    DOCUMENTATION CODES Per approved criteria  -Not Applicable    Joaquin Courts, RD, CNSC, LDN Pager# [phone] After Hours Pager# [phone] 04/10/2012, 11:08 AM

## 2012-04-10 NOTE — Progress Notes (Signed)
eLink Physician-Brief Progress Note Patient Name: Tara Shaffer DOB: 10-11-1945 MRN: [id number]  Date of Service  04/10/2012   HPI/Events of Note   Hypokalemia  eICU Interventions  Potassium replaced   Intervention Category Intermediate Interventions: Electrolyte abnormality - evaluation and management  DETERDING,ELIZABETH 04/10/2012, 11:30 PM
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
## 2012-04-10 NOTE — Progress Notes (Signed)
UR Completed.  Tara Shaffer Jane [phone] 04/10/2012  

## 2012-04-10 NOTE — Progress Notes (Signed)
We are not actively involved in her management.  We will sign off now that neurosurgery and Maxillo-facial surgery are involved.  Marta Lamas. Gae Bon, MD, FACS [phone] Trauma Surgeon

## 2012-04-11 ENCOUNTER — Inpatient Hospital Stay (HOSPITAL_COMMUNITY): Payer: 59

## 2012-04-11 LAB — BASIC METABOLIC PANEL
BUN: 3 mg/dL — ABNORMAL LOW (ref 6–23)
BUN: <3 mg/dL — ABNORMAL LOW (ref 6–23)
CO2: 17 mEq/L — ABNORMAL LOW (ref 19–32)
CO2: 21 mEq/L (ref 19–32)
CO2: 22 mEq/L (ref 19–32)
Calcium: 8.2 mg/dL — ABNORMAL LOW (ref 8.4–10.5)
Calcium: 8.5 mg/dL (ref 8.4–10.5)
Calcium: 8.7 mg/dL (ref 8.4–10.5)
Calcium: 8.4 mg/dL (ref 8.4–10.5)
Chloride: 80 mEq/L — ABNORMAL LOW (ref 96–112)
Chloride: 76 mEq/L — ABNORMAL LOW (ref 96–112)
Chloride: 82 mEq/L — ABNORMAL LOW (ref 96–112)
Creatinine, Ser: 0.53 mg/dL (ref 0.50–1.10)
GFR calc Af Amer: >90 mL/min (ref >90)
GFR calc Af Amer: >90 mL/min (ref >90)
GFR calc non Af Amer: >90 mL/min (ref >90)
GFR calc non Af Amer: >90 mL/min (ref >90)
GFR calc non Af Amer: >90 mL/min (ref >90)
Glucose, Bld: 199 mg/dL — ABNORMAL HIGH (ref 70–99)
Glucose, Bld: 126 mg/dL — ABNORMAL HIGH (ref 70–99)
Glucose, Bld: 112 mg/dL — ABNORMAL HIGH (ref 70–99)
Glucose, Bld: 97 mg/dL (ref 70–99)
Glucose, Bld: 102 mg/dL — ABNORMAL HIGH (ref 70–99)
Potassium: 3 mEq/L — ABNORMAL LOW (ref 3.5–5.1)
Potassium: 3.2 mEq/L — ABNORMAL LOW (ref 3.5–5.1)
Potassium: 4.2 mEq/L (ref 3.5–5.1)
Potassium: 3.4 mEq/L — ABNORMAL LOW (ref 3.5–5.1)
Sodium: 112 mEq/L — CL (ref 135–145)
Sodium: 106 mEq/L — CL (ref 135–145)
Sodium: 114 mEq/L — CL (ref 135–145)
Sodium: 115 mEq/L — CL (ref 135–145)
Sodium: 120 mEq/L — ABNORMAL LOW (ref 135–145)
Sodium: 125 mEq/L — ABNORMAL LOW (ref 135–145)

## 2012-04-11 LAB — CBC
HCT: 28.3 % — ABNORMAL LOW (ref 36.0–46.0)
Hemoglobin: 11.1 g/dL — ABNORMAL LOW (ref 12.0–15.0)
MCH: 33.8 pg (ref 26.0–34.0)
MCHC: >38.5 g/dL — ABNORMAL HIGH (ref 30.0–36.0)
RBC: 3.28 MIL/uL — ABNORMAL LOW (ref 3.87–5.11)

## 2012-04-11 LAB — OSMOLALITY, URINE: Osmolality, Ur: 225 mOsm/kg — ABNORMAL LOW (ref 390–1090)

## 2012-04-11 MED ORDER — SODIUM CHLORIDE 0.9 % IV SOLN: INTRAVENOUS | Status: DC

## 2012-04-11 MED ORDER — SODIUM CHLORIDE 0.9 % IV BOLUS (SEPSIS): 1000.0000 mL | Freq: Once | INTRAVENOUS | Status: DC

## 2012-04-11 MED ORDER — POTASSIUM CHLORIDE 10 MEQ/100ML IV SOLN
10.0000 meq | INTRAVENOUS | Status: AC
  Administered 2012-04-11 (×3): 10 meq via INTRAVENOUS
  Filled 2012-04-11: qty 400

## 2012-04-11 MED ORDER — SODIUM CHLORIDE 3 % IV SOLN
INTRAVENOUS | Status: DC
  Administered 2012-04-11 (×2): via INTRAVENOUS
  Filled 2012-04-11 (×3): qty 500

## 2012-04-11 MED ORDER — POTASSIUM CHLORIDE 10 MEQ/100ML IV SOLN
10.0000 meq | INTRAVENOUS | Status: AC
  Administered 2012-04-11 – 2012-04-12 (×2): 10 meq via INTRAVENOUS
  Filled 2012-04-11 (×2): qty 100

## 2012-04-11 MED ORDER — DEMECLOCYCLINE HCL 150 MG PO TABS
300.0000 mg | ORAL_TABLET | Freq: Two times a day (BID) | ORAL | Status: DC
  Administered 2012-04-11 – 2012-04-12 (×3): 300 mg via ORAL
  Filled 2012-04-11 (×4): qty 2

## 2012-04-11 MED ORDER — SODIUM CHLORIDE 3 % IV SOLN
INTRAVENOUS | Status: DC
  Filled 2012-04-11: qty 500

## 2012-04-11 MED ORDER — POTASSIUM CHLORIDE 10 MEQ/50ML IV SOLN
INTRAVENOUS | Status: AC
  Filled 2012-04-11: qty 50

## 2012-04-11 MED ORDER — SODIUM BICARBONATE 8.4 % IV SOLN
INTRAVENOUS | Status: DC
  Filled 2012-04-11 (×2): qty 150

## 2012-04-11 NOTE — Procedures (Signed)
Central Venous Catheter Insertion Procedure Note Tara Shaffer [id number] 1946-03-01  Procedure: Insertion of Central Venous Catheter Indications: Drug and/or fluid administration  Procedure Details Consent: Risks of procedure as well as the alternatives and risks of each were explained to the (patient/caregiver).  Consent for procedure obtained. Time Out: Verified patient identification, verified procedure, site/side was marked, verified correct patient position, special equipment/implants available, medications/allergies/relevent history reviewed, required imaging and test results available.  Performed  Maximum sterile technique was used including antiseptics, cap, gloves, gown, hand hygiene, mask and sheet. Skin prep: Chlorhexidine; local anesthetic administered A antimicrobial bonded/coated triple lumen catheter was placed in the right internal jugular vein using the Seldinger technique. Ultrasound guidance used.yes Catheter placed to 16 cm. Blood aspirated via all 3 ports and then flushed x 3. Line sutured x 2 and dressing applied.  Evaluation Blood flow good Complications: No apparent complications Patient did tolerate procedure well. Chest X-ray ordered to verify placement.  CXR: pending.  Brett Canales Minor ACNP Adolph Pollack PCCM Pager [phone] till 3 pm If no answer page [phone] 04/11/2012, 8:30 AM  Supervised and was present through the entire procedure.  Orlean Bradford, M.D., F.C.C.P. Pulmonary and Critical Care Medicine [facility] Cell: [phone] Pager: [phone]

## 2012-04-11 NOTE — Evaluation (Signed)
Occupational Therapy Evaluation Patient Details Name: Tara Shaffer MRN: [id number] DOB: July 22, 1946 Today's Date: 04/11/2012 Time: 8295-6213 OT Time Calculation (min): 25 min  OT Assessment / Plan / Recommendation Clinical Impression  Pt presents to OT with decreased I with ADL activity s/p fall.Pt will benefit from  skilled OT to regain I with ADL activity and return to PLOF with ADL activity. Pt has supportive husband    OT Assessment  Patient needs continued OT Services    Follow Up Recommendations  Home health OT       Equipment Recommendations  Tub/shower seat       Frequency  Min 2X/week    Precautions / Restrictions Precautions Precautions: Fall       ADL  Grooming: Simulated;Brushing hair;Wash/dry face;Minimal assistance Where Assessed - Grooming: Unsupported sitting Upper Body Bathing: Simulated;Moderate assistance Where Assessed - Upper Body Bathing: Unsupported sitting Lower Body Bathing: Simulated;Maximal assistance Where Assessed - Lower Body Bathing: Supported sit to stand Upper Body Dressing: Simulated;Moderate assistance Where Assessed - Upper Body Dressing: Unsupported sitting Lower Body Dressing: Simulated;Maximal assistance Where Assessed - Lower Body Dressing: Supported sit to stand Toilet Transfer: Automotive engineer Method: Sit to stand;Other (comment) (bed to chair only) Toileting - Clothing Manipulation and Hygiene: Simulated;Moderate assistance Where Assessed - Toileting Clothing Manipulation and Hygiene: Standing ADL Comments: husband states he will help with all ADL activity and provide 24/7 assist    OT Diagnosis: Generalized weakness  OT Problem List: Decreased strength OT Treatment Interventions: Self-care/ADL training;DME and/or AE instruction;Patient/family education       Visit Information  Last OT Received On: 04/11/12       Prior Functioning  Vision/Perception  Home Living Lives With: Spouse Available Help at  Discharge: Family;Available PRN/intermittently (most of time) Type of Home: House Home Access: Stairs to enter [employer] of Steps: 2 Entrance Stairs-Rails: None Home Layout: Two level;1/2 bath on main level;Bed/bath upstairs Alternate Level Stairs-Number of Steps: 1 flight Alternate Level Stairs-Rails: Right Bathroom Shower/Tub: Health visitor: Standard Home Adaptive Equipment: None Additional Comments: discussed moving master bedroom downstairs and adding grab bar to shower Prior Function Level of Independence: Independent (husband does the cooking) Able to Take Stairs?: Yes Driving: No Vocation: Retired Musician: No difficulties      Cognition  Overall Cognitive Status: Appears within functional limits for tasks assessed/performed Behavior During Session: [facility] for tasks performed    Extremity/Trunk Assessment Right Upper Extremity Assessment RUE ROM/Strength/Tone: [facility] for tasks assessed Left Upper Extremity Assessment LUE ROM/Strength/Tone: [facility] for tasks assessed   Mobility Bed Mobility Bed Mobility: Supine to Sit;Sitting - Scoot to Edge of Bed Supine to Sit: 3: Mod assist;HOB elevated Sitting - Scoot to Edge of Bed: 3: Mod assist Details for Bed Mobility Assistance: verbal cues for technique and hand placement Transfers Transfers: Sit to Stand;Stand to Sit Sit to Stand: From bed;With upper extremity assist;With armrests;From chair/3-in-1;4: Min assist Stand to Sit: 4: Min assist;To chair/3-in-1 (didn't use chair armrests despite verbal cues.) Details for Transfer Assistance: Bed to chair transfer with short shuffling steps.         End of Session OT - End of Session Activity Tolerance: Patient tolerated treatment well Patient left: in chair;with call bell/phone within reach;with family/visitor present Nurse Communication: Mobility status       [name] 04/11/2012, 1:47 PM

## 2012-04-11 NOTE — Progress Notes (Signed)
[facility] PCCM  I was called to the bedside to place a CVL for hypertonic saline as the patient has SIADH and her sodium has decreased in the last 24 hours.  Her mental status is unchanged (oriented to name, year, follows some commands).  I attempted to place a CVL on the left but she had no visible IJ vessel on the left and during subclavian attempt she was moving and could not keep her hands in place for me to work safely.  Given that she is not seizing, her Na is likely chronically low (120's last labs available to [place]) and her mental status has been similar to this for weeks by history, I will not order hypertonic saline.  Plan: -stop SSRI -order PICC -start demecocycline 300mg  po bid, first dose now -q2h BMET -check CXR  Full note from day team to follow  Yolonda Kida PCCM Pager: [phone] Cell: [phone] If no response, call [phone]

## 2012-04-11 NOTE — Progress Notes (Signed)
Critical call lab value Na+ 106. Dr. Darrick Penna called and given report. NNOs at this time. Will continue to assess

## 2012-04-11 NOTE — Progress Notes (Signed)
eLink Physician-Brief Progress Note Patient Name: Tara Shaffer DOB: Dec 15, 1945 MRN: [id number]  Date of Service  04/11/2012   HPI/Events of Note  Persistent hyponatremia despite IV NS.  Lab findings are suggestive now of SIADH with low serum osm and high urine osm.  Is on an SSRI which can aggravate SIADH.  Also drinking large amounts of water.  Also with large UOP again suggestive of a inability to appropriate regulate concentrating ability due to some lack of solute.   eICU Interventions  Plan: Place CVL Start 3% saline.  Since receiving KCL replacement will start low and work way up.   BMET q2 hours D/C SSRI NPO   Intervention Category Major Interventions: Electrolyte abnormality - evaluation and management  DETERDING,ELIZABETH 04/11/2012, 4:29 AM

## 2012-04-11 NOTE — Progress Notes (Signed)
Hypokalemia  Potassium replaced.  

## 2012-04-11 NOTE — Progress Notes (Signed)
Telephone consent obtained from patient's husband for central line placement. Dr. Kendrick Fries explained the benefits and risks. Central line placement needed for 3% Na infusion. Consent placed on chart and time out complete with Dr. Kendrick Fries and this nurse.
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
## 2012-04-11 NOTE — Progress Notes (Signed)
Patient throw up second administration of 40 meQ of K+ almost immediately after ingestion. Dr. Darrick Penna called and given report. Also reported on patient's increased thirst and increased urinary output. Dr. Darrick Penna to review labs and will follow up accordingly. Patient resting quietly at this time. Will continue to assess

## 2012-04-11 NOTE — Progress Notes (Addendum)
Critical call lab value of Na 114. Dr. Darrick Penna notified
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
## 2012-04-12 LAB — BASIC METABOLIC PANEL
BUN: <3 mg/dL — ABNORMAL LOW (ref 6–23)
BUN: <3 mg/dL — ABNORMAL LOW (ref 6–23)
BUN: 3 mg/dL — ABNORMAL LOW (ref 6–23)
BUN: 4 mg/dL — ABNORMAL LOW (ref 6–23)
CO2: 21 mEq/L (ref 19–32)
CO2: 19 mEq/L (ref 19–32)
Calcium: 8.5 mg/dL (ref 8.4–10.5)
Calcium: 8.9 mg/dL (ref 8.4–10.5)
Chloride: 93 mEq/L — ABNORMAL LOW (ref 96–112)
Chloride: 99 mEq/L (ref 96–112)
Creatinine, Ser: 0.54 mg/dL (ref 0.50–1.10)
Creatinine, Ser: 0.49 mg/dL — ABNORMAL LOW (ref 0.50–1.10)
GFR calc Af Amer: >90 mL/min (ref >90)
GFR calc Af Amer: >90 mL/min (ref >90)
GFR calc non Af Amer: >90 mL/min (ref >90)
GFR calc non Af Amer: >90 mL/min (ref >90)
Glucose, Bld: 138 mg/dL — ABNORMAL HIGH (ref 70–99)
Glucose, Bld: 205 mg/dL — ABNORMAL HIGH (ref 70–99)
Potassium: 3.4 mEq/L — ABNORMAL LOW (ref 3.5–5.1)
Potassium: 3.7 mEq/L (ref 3.5–5.1)
Sodium: 126 mEq/L — ABNORMAL LOW (ref 135–145)
Sodium: 129 mEq/L — ABNORMAL LOW (ref 135–145)

## 2012-04-12 MED ORDER — POTASSIUM CHLORIDE CRYS ER 20 MEQ PO TBCR: 40.0000 meq | EXTENDED_RELEASE_TABLET | ORAL | Status: DC

## 2012-04-12 MED ORDER — SODIUM CHLORIDE 0.9 % IV SOLN
INTRAVENOUS | Status: DC
  Administered 2012-04-12: 10:00:00 via INTRAVENOUS

## 2012-04-12 MED ORDER — POLYETHYLENE GLYCOL 3350 17 G PO PACK
17.0000 g | PACK | Freq: Every day | ORAL | Status: DC
  Administered 2012-04-12 – 2012-04-13 (×2): 17 g via ORAL
  Filled 2012-04-12 (×2): qty 1

## 2012-04-12 MED ORDER — POTASSIUM CHLORIDE CRYS ER 20 MEQ PO TBCR
40.0000 meq | EXTENDED_RELEASE_TABLET | ORAL | Status: AC
  Administered 2012-04-12 (×2): 40 meq via ORAL
  Filled 2012-04-12: qty 2

## 2012-04-12 MED ORDER — POTASSIUM CHLORIDE CRYS ER 20 MEQ PO TBCR
40.0000 meq | EXTENDED_RELEASE_TABLET | ORAL | Status: DC
  Filled 2012-04-12: qty 2

## 2012-04-12 MED ORDER — SODIUM CHLORIDE 0.9 % IV SOLN: INTRAVENOUS | Status: DC
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
## 2012-04-12 NOTE — Progress Notes (Signed)
Clinical Social Worker met with pt at bedside; [facility] Maralyn Sago present.  CSW provided opportunity for pt to process feelings; pt appeared to speak openly and not guarded or fearful.  When asked questions, pt stated, "I don't know".  Though pleasant, pt had difficulty answering direct questions.  (pt able to speak conversationaly).  Pt agreeable to CSW and RNCM contacting spouse.  CSW phoned spouse and reviewed PT/OT recommendations; spouse agreeable to Home Health.  Husband open to [facility] speaking with son who resides with pt/spouse.  CSW updated RNCM.     CSW staffed case with Chiropodist.   Angelia Mould, MSW, [place] [phone]
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
## 2012-04-13 DIAGNOSIS — F1010 Alcohol abuse, uncomplicated: Secondary | ICD-10-CM

## 2012-04-13 DIAGNOSIS — IMO0002 Reserved for concepts with insufficient information to code with codable children: Secondary | ICD-10-CM

## 2012-04-13 DIAGNOSIS — I10 Essential (primary) hypertension: Secondary | ICD-10-CM

## 2012-04-13 DIAGNOSIS — F411 Generalized anxiety disorder: Secondary | ICD-10-CM

## 2012-04-13 DIAGNOSIS — E876 Hypokalemia: Secondary | ICD-10-CM

## 2012-04-13 DIAGNOSIS — J309 Allergic rhinitis, unspecified: Secondary | ICD-10-CM

## 2012-04-13 DIAGNOSIS — E236 Other disorders of pituitary gland: Principal | ICD-10-CM

## 2012-04-13 DIAGNOSIS — R109 Unspecified abdominal pain: Secondary | ICD-10-CM

## 2012-04-13 DIAGNOSIS — E222 Syndrome of inappropriate secretion of antidiuretic hormone: Secondary | ICD-10-CM | POA: Diagnosis present

## 2012-04-13 DIAGNOSIS — W19XXXA Unspecified fall, initial encounter: Secondary | ICD-10-CM

## 2012-04-13 LAB — BASIC METABOLIC PANEL
CO2: 21 mEq/L (ref 19–32)
Calcium: 8.3 mg/dL — ABNORMAL LOW (ref 8.4–10.5)
Calcium: 8.8 mg/dL (ref 8.4–10.5)
Chloride: 98 mEq/L (ref 96–112)
GFR calc Af Amer: >90 mL/min (ref >90)
GFR calc non Af Amer: >90 mL/min (ref >90)
Sodium: 131 mEq/L — ABNORMAL LOW (ref 135–145)
Sodium: 129 mEq/L — ABNORMAL LOW (ref 135–145)

## 2012-04-13 LAB — CBC
MCH: 33 pg (ref 26.0–34.0)
Platelets: 247 10*3/uL (ref 150–400)
RBC: 2.97 MIL/uL — ABNORMAL LOW (ref 3.87–5.11)
WBC: 5.4 10*3/uL (ref 4.0–10.5)

## 2012-04-13 MED ORDER — ZOLPIDEM TARTRATE 5 MG PO TABS
10.0000 mg | ORAL_TABLET | Freq: Every evening | ORAL | Status: DC | PRN
  Administered 2012-04-13: 10 mg via ORAL
  Filled 2012-04-13: qty 2

## 2012-04-13 MED ORDER — POLYETHYLENE GLYCOL 3350 17 G PO PACK: 17.0000 g | PACK | Freq: Every day | ORAL | Status: AC | PRN

## 2012-04-13 MED ORDER — SENNA-DOCUSATE SODIUM 8.6-50 MG PO TABS: 1.0000 | ORAL_TABLET | Freq: Two times a day (BID) | ORAL | Status: DC | PRN

## 2012-04-13 MED ORDER — SODIUM CHLORIDE 0.9 % IJ SOLN
10.0000 mL | INTRAMUSCULAR | Status: DC | PRN
  Administered 2012-04-13: 10 mL

## 2012-04-13 NOTE — Progress Notes (Signed)
Discharge instructions and prescription fiven to pt and spouse . Verbalized understanding
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
## 2012-04-13 NOTE — Progress Notes (Signed)
HOME HEALTH AGENCIES SERVING [place]   Agencies that are Medicare-Certified and are affiliated with The [facility]  Telephone Number Address  [facility]   The [facility] has ownership interest in this company; however, you are under no obligation to use this agency. [phone] or  [phone] [address] [place], [place] [address]   Agencies that are Medicare-Certified and are not affiliated with [facility]                                                                                 Home Health Agency Telephone Number Address  [facility] [phone] Fax [phone] [address], [address] [place], [place]  [address]  [facility] [phone] or [phone] Fax [phone] [address] [place], [place] [address]  [facility] [phone] Fax [phone] [address] [place], [place] [address]  [facility] [phone] Fax [phone] [address], [address] [place], [place]  [address]  [facility] [phone] Fax [phone] [address], [address] [place], [place] [address]  [facility] [phone] [address] [place], [place] [address]  [facility] [phone]  [address] [place], [place] [address]  [facility] [phone] or [phone] Fax [phone] [address] [place], [place]  [address]  [facility] [phone] Fax [phone] [address] [place], [place]  [address]  [facility]  [phone] Fax [phone] [address] [place], [place] [address]               Agencies that are not Medicare-Certified and are not affiliated with [facility]   Home Health Agency Telephone Number Address  [facility] [phone] or [phone] Fax [phone] [address] [place], [place]  [address]  [facility] [phone] Fax [phone] [address] [place], [place]  [address]  [facility]  [phone] Fax [phone] [address] [place], [place] [address]  [facility] [phone] Fax [phone] [address] [place], [place] [address]  [facility] [phone] or [phone] Fax [phone] [address], [address] [place], [place]  [address]  [facility] [phone] or [phone] Fax [phone] [address] [place], [place]  [address]  [facility] [phone] Fax [phone] [address] [place], [place]  [address]  [facility] [phone] [address] [place], [place]  [address]  [facility] [phone] Fax [phone] [address] [place], [place]  [address]  [facility] [phone] Fax [phone] [address] , [place]  [address]  [facility] [phone] Fax [phone] [address] [place], [place] [address]  [facility] [phone] Fax [phone] [address] ,   [address]   
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
## 2012-04-17 ENCOUNTER — Ambulatory Visit (INDEPENDENT_AMBULATORY_CARE_PROVIDER_SITE_OTHER): Payer: 59 | Admitting: Internal Medicine

## 2012-04-17 ENCOUNTER — Encounter: Payer: Self-pay | Admitting: Internal Medicine

## 2012-04-17 ENCOUNTER — Other Ambulatory Visit (INDEPENDENT_AMBULATORY_CARE_PROVIDER_SITE_OTHER): Payer: 59

## 2012-04-17 VITALS — BP 146/92 | HR 79 | Temp 98.0°F | Ht 64.0 in | Wt 146.0 lb

## 2012-04-17 DIAGNOSIS — IMO0002 Reserved for concepts with insufficient information to code with codable children: Secondary | ICD-10-CM

## 2012-04-17 DIAGNOSIS — F1010 Alcohol abuse, uncomplicated: Secondary | ICD-10-CM

## 2012-04-17 DIAGNOSIS — S0280XA Fracture of other specified skull and facial bones, unspecified side, initial encounter for closed fracture: Secondary | ICD-10-CM

## 2012-04-17 DIAGNOSIS — N393 Stress incontinence (female) (male): Secondary | ICD-10-CM

## 2012-04-17 DIAGNOSIS — E871 Hypo-osmolality and hyponatremia: Secondary | ICD-10-CM

## 2012-04-17 DIAGNOSIS — R32 Unspecified urinary incontinence: Secondary | ICD-10-CM

## 2012-04-17 DIAGNOSIS — S0292XA Unspecified fracture of facial bones, initial encounter for closed fracture: Secondary | ICD-10-CM

## 2012-04-17 LAB — HEPATIC FUNCTION PANEL
AST: 30 U/L (ref 0–37)
Albumin: 3.9 g/dL (ref 3.5–5.2)
Alkaline Phosphatase: 95 U/L (ref 39–117)
Bilirubin, Direct: 0.1 mg/dL (ref 0.0–0.3)

## 2012-04-17 LAB — CBC WITH DIFFERENTIAL/PLATELET
Basophils Absolute: 0.1 10*3/uL (ref 0.0–0.1)
Eosinophils Absolute: 0 10*3/uL (ref 0.0–0.7)
HCT: 33 % — ABNORMAL LOW (ref 36.0–46.0)
Lymphs Abs: 1.1 10*3/uL (ref 0.7–4.0)
MCHC: 33.9 g/dL (ref 30.0–36.0)
MCV: 100.4 fl — ABNORMAL HIGH (ref 78.0–100.0)
Monocytes Absolute: 0.6 10*3/uL (ref 0.1–1.0)
Neutro Abs: 4.7 10*3/uL (ref 1.4–7.7)
Platelets: 407 10*3/uL — ABNORMAL HIGH (ref 150.0–400.0)
RDW: 13.2 % (ref 11.5–14.6)

## 2012-04-17 LAB — URINALYSIS, ROUTINE W REFLEX MICROSCOPIC
Hgb urine dipstick: NEGATIVE
Nitrite: NEGATIVE

## 2012-04-17 LAB — BASIC METABOLIC PANEL
GFR: 92.34 mL/min (ref >60.00)
Glucose, Bld: 104 mg/dL — ABNORMAL HIGH (ref 70–99)
Potassium: 3.4 mEq/L — ABNORMAL LOW (ref 3.5–5.1)
Sodium: 134 mEq/L — ABNORMAL LOW (ref 135–145)

## 2012-04-17 NOTE — Patient Instructions (Addendum)
Continue all other medications as before Please go to LAB in the Basement for the blood and/or urine tests to be done today You will be contacted by phone if any changes need to be made immediately.  Otherwise, you will receive a letter about your results with an explanation. Please keep your appointments with your specialists as you have planned Please use the walker at home to help with stability and risk of fall

## 2012-04-18 ENCOUNTER — Telehealth: Payer: Self-pay | Admitting: Internal Medicine

## 2012-04-18 NOTE — Telephone Encounter (Signed)
noted 

## 2012-04-18 NOTE — Telephone Encounter (Signed)
[facility] called and stated pt refused PT eval

## 2012-04-23 ENCOUNTER — Encounter: Payer: Self-pay | Admitting: Internal Medicine

## 2012-04-23 DIAGNOSIS — R32 Unspecified urinary incontinence: Secondary | ICD-10-CM | POA: Insufficient documentation

## 2012-04-23 NOTE — Assessment & Plan Note (Signed)
To cont abstinence, reinforced this with husband and pt
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
## 2012-04-23 NOTE — Assessment & Plan Note (Signed)
For f/u NS as planned 

## 2012-04-23 NOTE — Assessment & Plan Note (Signed)
For f/u lab today,  to f/u any worsening symptoms or concerns 

## 2012-04-23 NOTE — Assessment & Plan Note (Signed)
For f/u with ENT as planned

## 2012-04-23 NOTE — Assessment & Plan Note (Signed)
To r/o UTI - for UA

## 2012-05-09 ENCOUNTER — Encounter: Payer: Self-pay | Admitting: Neurology

## 2012-05-09 ENCOUNTER — Ambulatory Visit (INDEPENDENT_AMBULATORY_CARE_PROVIDER_SITE_OTHER): Payer: 59 | Admitting: Neurology

## 2012-05-09 VITALS — BP 136/82 | HR 100 | Wt 157.0 lb

## 2012-05-09 DIAGNOSIS — F1010 Alcohol abuse, uncomplicated: Secondary | ICD-10-CM
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
## 2012-05-30 ENCOUNTER — Other Ambulatory Visit: Payer: Self-pay | Admitting: Internal Medicine

## 2012-05-30 NOTE — Telephone Encounter (Signed)
Done hardcopy to robin  

## 2012-05-30 NOTE — Telephone Encounter (Signed)
Faxed hardcopy to [facility] 

## 2012-06-01 ENCOUNTER — Other Ambulatory Visit: Payer: Self-pay | Admitting: *Deleted

## 2012-06-01 MED ORDER — ROSUVASTATIN CALCIUM 40 MG PO TABS: 20.0000 mg | ORAL_TABLET | Freq: Every day | ORAL | Status: DC

## 2012-06-01 NOTE — Telephone Encounter (Signed)
R'cd fax from [facility] for refill of Crestor.  

## 2012-09-04 ENCOUNTER — Other Ambulatory Visit: Payer: Self-pay

## 2012-09-04 MED ORDER — CLONAZEPAM 0.5 MG PO TABS: ORAL_TABLET | ORAL | Status: DC

## 2012-09-04 NOTE — Telephone Encounter (Signed)
Faxed hardcopy to pharmacy. 

## 2012-09-04 NOTE — Telephone Encounter (Signed)
Done hardcopy to robin  

## 2012-09-06 ENCOUNTER — Other Ambulatory Visit (INDEPENDENT_AMBULATORY_CARE_PROVIDER_SITE_OTHER): Payer: 59

## 2012-09-06 DIAGNOSIS — Z0000 Encounter for general adult medical examination without abnormal findings: Secondary | ICD-10-CM

## 2012-09-06 LAB — URINALYSIS, ROUTINE W REFLEX MICROSCOPIC
Ketones, ur: NEGATIVE
Specific Gravity, Urine: 1.02 (ref 1.000–1.030)
Urine Glucose: NEGATIVE
Urobilinogen, UA: 1 (ref 0.0–1.0)
pH: 6.5 (ref 5.0–8.0)

## 2012-09-06 LAB — LIPID PANEL
HDL: 78.1 mg/dL (ref >39.00)
Total CHOL/HDL Ratio: 3
Triglycerides: 75 mg/dL (ref 0.0–149.0)
VLDL: 15 mg/dL (ref 0.0–40.0)

## 2012-09-06 LAB — CBC WITH DIFFERENTIAL/PLATELET
Basophils Absolute: 0 10*3/uL (ref 0.0–0.1)
HCT: 43 % (ref 36.0–46.0)
Hemoglobin: 14.6 g/dL (ref 12.0–15.0)
Lymphs Abs: 1 10*3/uL (ref 0.7–4.0)
Monocytes Relative: 4.5 % (ref 3.0–12.0)
Neutro Abs: 7.7 10*3/uL (ref 1.4–7.7)
RDW: 14.2 % (ref 11.5–14.6)

## 2012-09-06 LAB — BASIC METABOLIC PANEL
CO2: 26 mEq/L (ref 19–32)
Chloride: 101 mEq/L (ref 96–112)
GFR: 73.28 mL/min (ref >60.00)
Glucose, Bld: 112 mg/dL — ABNORMAL HIGH (ref 70–99)
Potassium: 3.7 mEq/L (ref 3.5–5.1)
Sodium: 137 mEq/L (ref 135–145)

## 2012-09-06 LAB — LDL CHOLESTEROL, DIRECT: Direct LDL: 120.9 mg/dL

## 2012-09-06 LAB — HEPATIC FUNCTION PANEL: Total Bilirubin: 1.1 mg/dL (ref 0.3–1.2)

## 2012-09-07 ENCOUNTER — Ambulatory Visit: Payer: 59

## 2012-09-07 DIAGNOSIS — R7309 Other abnormal glucose: Secondary | ICD-10-CM

## 2012-09-07 LAB — HEMOGLOBIN A1C: Hgb A1c MFr Bld: 5.2 % (ref 4.6–6.5)

## 2012-09-18 ENCOUNTER — Ambulatory Visit (INDEPENDENT_AMBULATORY_CARE_PROVIDER_SITE_OTHER): Payer: Medicare Other | Admitting: Internal Medicine

## 2012-09-18 ENCOUNTER — Encounter: Payer: Self-pay | Admitting: Internal Medicine

## 2012-09-18 VITALS — BP 152/90 | HR 83 | Temp 96.8°F | Ht 64.0 in | Wt 161.4 lb

## 2012-09-18 DIAGNOSIS — Z0000 Encounter for general adult medical examination without abnormal findings: Secondary | ICD-10-CM

## 2012-09-18 MED ORDER — CITALOPRAM HYDROBROMIDE 40 MG PO TABS: 40.0000 mg | ORAL_TABLET | Freq: Every day | ORAL | Status: DC

## 2012-09-18 MED ORDER — ROSUVASTATIN CALCIUM 40 MG PO TABS: 20.0000 mg | ORAL_TABLET | Freq: Every day | ORAL | Status: DC

## 2012-09-18 MED ORDER — AMLODIPINE BESYLATE 10 MG PO TABS: 10.0000 mg | ORAL_TABLET | Freq: Every day | ORAL | Status: DC

## 2012-09-18 MED ORDER — ZOLPIDEM TARTRATE ER 6.25 MG PO TBCR: 6.2500 mg | EXTENDED_RELEASE_TABLET | Freq: Every evening | ORAL | Status: DC | PRN

## 2012-09-18 NOTE — Assessment & Plan Note (Signed)

## 2012-09-18 NOTE — Patient Instructions (Addendum)
Continue all other medications as before Please have the pharmacy call with any other refills you may need. Please continue your efforts at being more active, low cholesterol diet, and weight control. Thank you for enrolling in MyChart. Please follow the instructions below to securely access your online medical record. MyChart allows you to send messages to your doctor, view your test results, renew your prescriptions, schedule appointments, and more. To Log into MyChart, please go to [email or web address], and your Username is: [email or web address] (pass mandy) Please return in 6 mo with Lab testing done 3-5 days before

## 2012-09-18 NOTE — Progress Notes (Signed)
Subjective:    Patient ID: Tara Shaffer, female    DOB: 01/22/1946, 66 y.o.   MRN: [id number]  HPI Here for wellness and f/u with husband;  Overall doing ok;  Pt denies CP, worsening SOB, DOE, wheezing, orthopnea, PND, worsening LE edema, palpitations, dizziness or syncope.  Pt denies neurological change such as new Headache, facial or extremity weakness.  Pt denies polydipsia, polyuria, or low sugar symptoms. Pt states overall good compliance with treatment and medications, good tolerability, and trying to follow lower cholesterol diet.  Pt denies worsening depressive symptoms, suicidal ideation or panic. No fever, wt loss, night sweats, loss of appetite, or other constitutional symptoms.  Pt states good ability with ADL's, low fall risk, home safety reviewed and adequate, no significant changes in hearing or vision, and occasionally active with exercise. Still drinking some ETOH recently. Memory loss persists.  Needs med refills.  Pt unable to do her finances or take care of herself medically.  Husband not interested in further neurologial referral, thinks likely related to ETOH Past Medical History  Diagnosis Date  . MELANOMA, HX OF 08/04/2007  . ALLERGIC RHINITIS 06/23/2007  . ANXIETY DEPRESSION 06/12/2009  . ANXIETY 06/23/2007  . 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 GERD 06/23/2007  . HYPERLIPIDEMIA 06/24/2007  . HYPERTENSION 06/23/2007  . COLONIC POLYPS, HX OF 03/08/2002  . DIVERTICULOSIS, COLON 08/04/2007  . Irritable bowel syndrome 06/23/2007  . Memory dysfunction 06/23/2011  . Cancer     skin cx/ hx melanoma  . LOW BACK PAIN 06/24/2007  . Alcohol abuse 02/27/2012   Past Surgical History  Procedure Date  . Cesarean section     x 2  . Colonoscopy   . Appendectomy     reports that she has never smoked. She has never used smokeless tobacco. She reports that she drinks about 25.2 ounces of alcohol per week. She reports that she does not use illicit drugs. family history  includes Colon cancer in her cousin; Diabetes in her other; and Hypertension in her other. Allergies  Allergen Reactions  . Codeine Phosphate Nausea Only   Current Outpatient Prescriptions on File Prior to Visit  Medication Sig Dispense Refill  . amLODipine (NORVASC) 10 MG tablet Take 1 tablet (10 mg total) by mouth daily.  90 tablet  3  . clonazePAM (KLONOPIN) 0.5 MG tablet TAKE 1 TABLET BY MOUTH TWICE DAILY AS NEEDED FOR ANXIETY; to fill Sep 21, 2012  60 tablet  3  . dexlansoprazole (DEXILANT) 60 MG capsule Take 1 capsule (60 mg total) by mouth daily.  90 capsule  3  . zolpidem (AMBIEN CR) 6.25 MG CR tablet Take 1 tablet (6.25 mg total) by mouth at bedtime as needed. For sleep  90 tablet  1   Review of Systems Review of Systems  Constitutional: Negative for diaphoresis, activity change, appetite change and unexpected weight change.  HENT: Negative for hearing loss, ear pain, facial swelling, mouth sores and neck stiffness.   Eyes: Negative for pain, redness and visual disturbance.  Respiratory: Negative for shortness of breath and wheezing.   Cardiovascular: Negative for chest pain and palpitations.  Gastrointestinal: Negative for diarrhea, blood in stool, abdominal distention and rectal pain.  Genitourinary: Negative for hematuria, flank pain and decreased urine volume.  Musculoskeletal: Negative for myalgias and joint swelling.  Skin: Negative for color change and wound.  Neurological: Negative for syncope and numbness.  Hematological: Negative for adenopathy.  Psychiatric/Behavioral: Negative for hallucinations, self-injury, decreased concentration and agitation.  Objective:   Physical Exam BP 152/90  Pulse 83  Temp 96.8 F (36 C) (Oral)  Ht 5\' 4"  (1.626 m)  Wt 161 lb 6 oz (73.199 kg)  BMI 27.70 kg/m2  SpO2 92% Physical Exam  VS noted Constitutional: Pt is oriented to person, place, and time. Appears well-developed and well-nourished.  HENT:  Head: Normocephalic and  atraumatic.  Right Ear: External ear normal.  Left Ear: External ear normal.  Nose: Nose normal.  Mouth/Throat: Oropharynx is clear and moist.  Eyes: Conjunctivae and EOM are normal. Pupils are equal, round, and reactive to light.  Neck: Normal range of motion. Neck supple. No JVD present. No tracheal deviation present.  Cardiovascular: Normal rate, regular rhythm, normal heart sounds and intact distal pulses.   Pulmonary/Chest: Effort normal and breath sounds normal.  Abdominal: Soft. Bowel sounds are normal. There is no tenderness.  Musculoskeletal: Normal range of motion. Exhibits no edema.  Lymphadenopathy:  Has no cervical adenopathy.  Neurological: Pt is alert and oriented to person, place, and time. Pt has normal reflexes. No cranial nerve deficit.  Skin: Skin is warm and dry. No rash noted.  Psychiatric:  Mild nervous    Assessment & Plan:

## 2012-09-25 ENCOUNTER — Telehealth: Payer: Self-pay

## 2012-09-25 NOTE — Telephone Encounter (Signed)
Initiated PA for zolpidem ER 6.25 mg.  Medication was denied as patient must have tried preferred medications and failed. Suggested medications to try would be lunesta, Temazepam and zaleplon.  Please advise

## 2012-09-27 MED ORDER — ESZOPICLONE 2 MG PO TABS: 2.0000 mg | ORAL_TABLET | Freq: Every evening | ORAL | Status: DC | PRN

## 2012-09-27 NOTE — Telephone Encounter (Signed)
Ok to change to [place] 2 mg, Done hardcopy to [employer]

## 2012-09-28 NOTE — Telephone Encounter (Signed)
Faxed hardcopy to pharmacy. 

## 2012-11-11 ENCOUNTER — Other Ambulatory Visit: Payer: Self-pay

## 2012-12-15 ENCOUNTER — Other Ambulatory Visit: Payer: Self-pay | Admitting: Internal Medicine

## 2012-12-18 ENCOUNTER — Other Ambulatory Visit: Payer: Self-pay | Admitting: Internal Medicine

## 2012-12-18 NOTE — Telephone Encounter (Signed)
Pt was rx #90 with 1 ref in dec 2013  Should not need refill yet

## 2012-12-18 NOTE — Telephone Encounter (Signed)
Called the patient no answer and no vm 

## 2012-12-19 ENCOUNTER — Ambulatory Visit (INDEPENDENT_AMBULATORY_CARE_PROVIDER_SITE_OTHER): Payer: Medicare Other | Admitting: Internal Medicine

## 2012-12-19 ENCOUNTER — Encounter: Payer: Self-pay | Admitting: Internal Medicine

## 2012-12-19 ENCOUNTER — Ambulatory Visit (INDEPENDENT_AMBULATORY_CARE_PROVIDER_SITE_OTHER)
Admission: RE | Admit: 2012-12-19 | Discharge: 2012-12-19 | Disposition: A | Discharge status: Home / Self Care | Payer: Medicare Other | Source: Ambulatory Visit | Attending: Internal Medicine | Admitting: Internal Medicine

## 2012-12-19 ENCOUNTER — Telehealth: Payer: Self-pay | Admitting: Internal Medicine

## 2012-12-19 VITALS — BP 162/92 | HR 82 | Temp 97.1°F

## 2012-12-19 DIAGNOSIS — M7918 Myalgia, other site: Secondary | ICD-10-CM

## 2012-12-19 DIAGNOSIS — M25551 Pain in right hip: Secondary | ICD-10-CM | POA: Insufficient documentation

## 2012-12-19 DIAGNOSIS — IMO0001 Reserved for inherently not codable concepts without codable children: Secondary | ICD-10-CM

## 2012-12-19 DIAGNOSIS — M25559 Pain in unspecified hip: Secondary | ICD-10-CM

## 2012-12-19 DIAGNOSIS — F1010 Alcohol abuse, uncomplicated: Secondary | ICD-10-CM

## 2012-12-19 DIAGNOSIS — I10 Essential (primary) hypertension: Secondary | ICD-10-CM

## 2012-12-19 MED ORDER — ROSUVASTATIN CALCIUM 40 MG PO TABS: 20.0000 mg | ORAL_TABLET | Freq: Every day | ORAL | Status: AC

## 2012-12-19 MED ORDER — OXYCODONE HCL 5 MG PO TABS: ORAL_TABLET | ORAL | Status: DC

## 2012-12-19 NOTE — Telephone Encounter (Signed)
[facility] Triage Call Report Triage Record Num: [id number] Operator: Tomasita Crumble Patient Name: Embree Brawley Call Date & Time: 12/18/2012 5:44:24PM Patient Phone: [phone] PCP: Oliver Barre Patient Gender: Female PCP Fax : [phone] Patient DOB: 05/26/1946 Practice Name: [facility] Reason for Call: Caller: Richard/Spouse; PCP: Oliver Barre (Adults only); CB#: [phone]; Call regarding "hip Problem"; Pain in right hip onset 3/16; fell a couple days prior to pain. She has been seen at urgent care and advised no fractures. Currently denies pain at rest; caller states pain at 9 when walking. Mild to moderate pain that has not improved with 24 hours of home care; caller states he did not get Rx for pain filled "because shd drinks a fair amount of wine". Emergent symptoms ruled out. See provider within 24 hours per Hip Injury protocol. 11:00 appointment with Dr. Jonny Ruiz for 30 min due to recent Urgent Care assessment. Protocol(s) Used: Hip Injury Recommended Outcome per Protocol: See Provider within 24 hours Reason for Outcome: New onset mild to moderate pain that has not improved with 24 hours of home care

## 2012-12-19 NOTE — Progress Notes (Signed)
Subjective:    Patient ID: Tara Shaffer, female    DOB: 11/13/1945, 67 y.o.   MRN: [id number]  HPI  Here with husband who gives most of hx  Pt here (seated in wheelchair, cannot stand without assist, cannot walk more than 1-2 steps but husband seems to be able to handle) This is after a fall approx 2 wks ago but helped to the ground by her husband, no pain immed after, but in the next 2 days started to limp, gradually worse since then in severity, now essentially unable to stand and walk; was seen at [facility] in [place] last wk (assoc with [facility], no records available); husband reports right hip film neg for acute, as per his understanding of what he was told by a Advice worker.  Tx with pain med (cant recall name) but not taking anyway, not clear why except husband did not really trust the provider it seems.  Denies other falls or injury, pt continues to drink ETOH and unfortunately husband continues to enable this. Denies urinary symptoms such as dysuria, frequency, urgency, flank pain, hematuria or n/v, fever, chills.  Denies worsening reflux, abd pain, dysphagia, n/v, bowel change or blood.  Denies other fall but pt memory has been reliable in past for unclear reasons - ? Psych vs dementia of some kind.  Pt states pain ok with sitting still, no LBP, but quite severe to stand.  Has hx of spinal fx, and facial fx with prior falls Past Medical History  Diagnosis Date  . MELANOMA, HX OF 08/04/2007  . ALLERGIC RHINITIS 06/23/2007  . ANXIETY DEPRESSION 06/12/2009  . ANXIETY 06/23/2007  . 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 GERD 06/23/2007  . HYPERLIPIDEMIA 06/24/2007  . HYPERTENSION 06/23/2007  . COLONIC POLYPS, HX OF 03/08/2002  . DIVERTICULOSIS, COLON 08/04/2007  . Irritable bowel syndrome 06/23/2007  . Memory dysfunction 06/23/2011  . Cancer     skin cx/ hx melanoma  . LOW BACK PAIN 06/24/2007  . Alcohol abuse 02/27/2012   Past Surgical History  Procedure Laterality Date   . Cesarean section      x 2  . Colonoscopy    . Appendectomy      reports that she has never smoked. She has never used smokeless tobacco. She reports that she drinks about 25.2 ounces of alcohol per week. She reports that she does not use illicit drugs. family history includes Colon cancer in her cousin; Diabetes in her other; and Hypertension in her other. Allergies  Allergen Reactions  . Codeine Phosphate Nausea Only   Current Outpatient Prescriptions on File Prior to Visit  Medication Sig Dispense Refill  . amLODipine (NORVASC) 10 MG tablet Take 1 tablet (10 mg total) by mouth daily.  90 tablet  3  . citalopram (CELEXA) 40 MG tablet Take 1 tablet (40 mg total) by mouth daily.  90 tablet  3  . citalopram (CELEXA) 40 MG tablet TAKE 1 TABLET BY MOUTH DAILY  90 tablet  3  . clonazePAM (KLONOPIN) 0.5 MG tablet TAKE 1 TABLET BY MOUTH TWICE DAILY AS NEEDED FOR ANXIETY; to fill Sep 21, 2012  60 tablet  3  . eszopiclone (LUNESTA) 2 MG TABS Take 1 tablet (2 mg total) by mouth at bedtime as needed. Take immediately before bedtime  30 tablet  2  . rosuvastatin (CRESTOR) 40 MG tablet Take 0.5 tablets (20 mg total) by mouth daily.  45 tablet  3  . zolpidem (AMBIEN CR) 6.25 MG CR  tablet Take 1 tablet (6.25 mg total) by mouth at bedtime as needed. For sleep  90 tablet  1  . dexlansoprazole (DEXILANT) 60 MG capsule Take 1 capsule (60 mg total) by mouth daily.  90 capsule  3   No current facility-administered medications on file prior to visit.   Review of Systems  Constitutional: Negative for unexpected weight change, or unusual diaphoresis  HENT: Negative for tinnitus.   Eyes: Negative for photophobia and visual disturbance.  Respiratory: Negative for choking and stridor.   Gastrointestinal: Negative for vomiting and blood in stool.  Genitourinary: Negative for hematuria and decreased urine volume.  Musculoskeletal: Negative for acute joint swelling Skin: Negative for color change and wound.   Neurological: Negative for tremors and numbness other than noted  Psychiatric/Behavioral: Negative for decreased concentration or  hyperactivity.       Objective:   Physical Exam BP 162/92  Pulse 82  Temp(Src) 97.1 F (36.2 C) (Oral)  SpO2 95% VS noted, not ill appaering but uncomfortable to try to move from sitting position Constitutional: Pt appears well-developed and well-nourished.  HENT: Head: NCAT.  Right Ear: External ear normal.  Left Ear: External ear normal.  Eyes: Conjunctivae and EOM are normal. Pupils are equal, round, and reactive to light.  Neck: Normal range of motion. Neck supple.  Cardiovascular: Normal rate and regular rhythm.   Pulmonary/Chest: Effort normal and breath sounds normal.  Abd:  Soft, NT, non-distended, + BS Spine;  nontender Has some tenderness but mild to right ischial tuberosity area, no SI joint area tender, no tender over the greater trochanter Pain elicited with right hip flexion while seated, much less with left hip flexion Neurological: Pt is alert. Has baseline confusion Skin: Skin is warm. No erythema.  Psychiatric: Pt behavior is normal. Not agitated     Assessment & Plan:

## 2012-12-19 NOTE — Telephone Encounter (Signed)
Informed the patients husband and he stated would discuss with MD today 12/19/12 at appointment.

## 2012-12-19 NOTE — Patient Instructions (Signed)
Please take all new medication as prescribed Please continue all other medications as before Please go to the XRAY Department in the Basement (go straight as you get off the elevator) for the x-ray testing (pelvis/hips, and lower back) You will be contacted regarding the referral for: orthopedic If the xrays do not show a significant problem, we should consider MRI to rule out AVN

## 2012-12-19 NOTE — Assessment & Plan Note (Signed)
elev today likely situational, o/w stable overall by history and exam, recent data reviewed with pt, and pt to continue medical treatment as before,  to f/u any worsening symptoms or concerns BP Readings from Last 3 Encounters:  12/19/12 162/92  09/18/12 152/90  05/09/12 136/82

## 2012-12-19 NOTE — Assessment & Plan Note (Addendum)
Vs referred LBP vs other; pain gradually worsening over weeks now unable to walk/stand without assist (? More than one fall recently?) , husband supportive but also enabling her ETOH use,   For today will start with plain films LS spine and right hip/pelvis, but if neg would still likely need MRI right hip to r/o AVN or occult fx, also for refer ortho and pain control today  Note:  Total time for pt hx, exam, review of record with pt in the room, determination of diagnoses and plan for further eval and tx is > 40 min, with over 50% spent in coordination and counseling of patient

## 2012-12-19 NOTE — Assessment & Plan Note (Signed)
Encouraged again for complete abstinence

## 2012-12-29 ENCOUNTER — Telehealth: Payer: Self-pay | Admitting: Internal Medicine

## 2012-12-29 NOTE — Telephone Encounter (Signed)
Please consider ER to r/o UTI, and may require admit if unable to care at home, and from there might be able to access short term rehab for her

## 2012-12-29 NOTE — Telephone Encounter (Signed)
Called the husband informed of MD instructions and he agreed to do so.

## 2012-12-29 NOTE — Telephone Encounter (Signed)
Patient Information:  Caller Name: Raiford Noble  Phone: [phone]  Patient: Tara Shaffer, Tara Shaffer  Gender: Female  DOB: August 25, 1946  Age: 67 Years  PCP: Oliver Barre (Adults only)  Office Follow Up:  Does the office need to follow up with this patient?: Yes  Instructions For The Office: an office visit was suggested to assess pt and address the spouses concerns. He refused. His answers were concerning - suggest staff check with MD who  may not  require and office visit if there is a short stay visit that could be obtained somewhere.  Spouse sounds "overwhelmed". Staff can check with MD and call spouse back.   Symptoms  Reason For Call & Symptoms: Pts spouse is calling: he brings ups multiple concerns. Pt fractured her hip 3 weeks ago. She was referred to [facility] orthopaedics - they advised the fracture was mild/no repair recommended. Pts spouse is calling to say that she was incontinent of urine but it has gotten worse - she tells him she has to urinate; he takes her to the bathroom and she can't go/he takes her back to the couch/she urinates. She has  Also become incontinent of stool. He has been caring for her and he doesn't feel he has the tools to care for her. He is asking is there a short term facility. He expresses concern also about possible low sodium  Reviewed Health History In EMR: Yes  Reviewed Medications In EMR: Yes  Reviewed Allergies In EMR: Yes  Reviewed Surgeries / Procedures: Yes  Date of Onset of Symptoms: 12/29/2012  Guideline(s) Used:  Urination Pain - Female  Disposition Per Guideline:   See Today in Office  Reason For Disposition Reached:   All other females with painful urination, or patient wants to be seen  Advice Given:  [name]  Patient Refused Recommendation:  Patient Will Follow Up With Office Later  He cannot bring the pt in today . Its "been a bad day".

## 2013-01-01 ENCOUNTER — Telehealth: Payer: Self-pay | Admitting: Internal Medicine

## 2013-01-01 ENCOUNTER — Ambulatory Visit: Payer: Medicare Other | Admitting: Internal Medicine

## 2013-01-01 MED ORDER — OXYCODONE HCL 5 MG PO TABS: ORAL_TABLET | ORAL | Status: DC

## 2013-01-01 NOTE — Telephone Encounter (Signed)
Done hardcopy to robin  

## 2013-01-01 NOTE — Telephone Encounter (Signed)
Called the patient informed to pickup hardcopy at the front desk. 

## 2013-01-01 NOTE — Telephone Encounter (Signed)
Requesting RX for oxycodone refill to pick up.

## 2013-02-05 ENCOUNTER — Ambulatory Visit: Payer: Medicare Other | Attending: Orthopedic Surgery | Admitting: Physical Therapy

## 2013-02-05 DIAGNOSIS — R5381 Other malaise: Secondary | ICD-10-CM | POA: Insufficient documentation

## 2013-02-05 DIAGNOSIS — IMO0001 Reserved for inherently not codable concepts without codable children: Secondary | ICD-10-CM | POA: Insufficient documentation

## 2013-02-05 DIAGNOSIS — R262 Difficulty in walking, not elsewhere classified: Secondary | ICD-10-CM | POA: Insufficient documentation

## 2013-02-05 DIAGNOSIS — M25559 Pain in unspecified hip: Secondary | ICD-10-CM | POA: Insufficient documentation

## 2013-02-08 ENCOUNTER — Ambulatory Visit: Payer: Medicare Other | Admitting: Physical Therapy

## 2013-02-13 ENCOUNTER — Ambulatory Visit: Payer: Medicare Other | Admitting: Physical Therapy

## 2013-02-15 ENCOUNTER — Ambulatory Visit: Payer: Medicare Other | Admitting: Physical Therapy

## 2013-02-20 ENCOUNTER — Ambulatory Visit: Payer: Medicare Other | Admitting: Physical Therapy

## 2013-02-22 ENCOUNTER — Ambulatory Visit: Payer: Medicare Other | Admitting: Physical Therapy

## 2013-02-27 ENCOUNTER — Ambulatory Visit: Payer: Medicare Other | Attending: Orthopedic Surgery | Admitting: Physical Therapy

## 2013-02-27 DIAGNOSIS — M25559 Pain in unspecified hip: Secondary | ICD-10-CM | POA: Insufficient documentation

## 2013-02-27 DIAGNOSIS — IMO0001 Reserved for inherently not codable concepts without codable children: Secondary | ICD-10-CM | POA: Insufficient documentation

## 2013-02-27 DIAGNOSIS — R262 Difficulty in walking, not elsewhere classified: Secondary | ICD-10-CM | POA: Insufficient documentation

## 2013-02-27 DIAGNOSIS — R5381 Other malaise: Secondary | ICD-10-CM | POA: Insufficient documentation

## 2013-03-01 ENCOUNTER — Ambulatory Visit: Payer: Medicare Other | Admitting: Physical Therapy

## 2013-03-06 ENCOUNTER — Ambulatory Visit: Payer: Medicare Other | Admitting: Physical Therapy

## 2013-03-08 ENCOUNTER — Ambulatory Visit: Payer: Medicare Other | Admitting: Physical Therapy

## 2013-03-13 ENCOUNTER — Ambulatory Visit: Payer: Medicare Other | Admitting: Physical Therapy

## 2013-03-15 ENCOUNTER — Ambulatory Visit: Payer: Medicare Other | Admitting: Physical Therapy

## 2013-03-20 ENCOUNTER — Ambulatory Visit: Payer: Medicare Other | Admitting: Rehabilitation

## 2013-03-22 ENCOUNTER — Ambulatory Visit: Payer: Medicare Other | Admitting: Rehabilitation

## 2013-03-27 ENCOUNTER — Ambulatory Visit: Payer: Medicare Other | Attending: Orthopedic Surgery | Admitting: Physical Therapy

## 2013-03-27 DIAGNOSIS — IMO0001 Reserved for inherently not codable concepts without codable children: Secondary | ICD-10-CM | POA: Insufficient documentation

## 2013-03-27 DIAGNOSIS — R5381 Other malaise: Secondary | ICD-10-CM | POA: Insufficient documentation

## 2013-03-27 DIAGNOSIS — R262 Difficulty in walking, not elsewhere classified: Secondary | ICD-10-CM | POA: Insufficient documentation

## 2013-03-27 DIAGNOSIS — M25559 Pain in unspecified hip: Secondary | ICD-10-CM | POA: Insufficient documentation

## 2013-03-29 ENCOUNTER — Ambulatory Visit: Payer: Medicare Other | Admitting: Rehabilitation

## 2013-04-03 ENCOUNTER — Ambulatory Visit: Payer: Medicare Other | Admitting: Physical Therapy

## 2013-04-24 ENCOUNTER — Other Ambulatory Visit: Payer: Self-pay | Admitting: Dermatology

## 2013-05-02 ENCOUNTER — Other Ambulatory Visit: Payer: Self-pay

## 2013-05-09 ENCOUNTER — Telehealth: Payer: Self-pay | Admitting: *Deleted

## 2013-05-09 NOTE — Telephone Encounter (Signed)
Done hardcopy to robin  

## 2013-05-09 NOTE — Telephone Encounter (Signed)
Called the husband Gerlene Burdock) informed letter requested has been mailed to their home.

## 2013-05-09 NOTE — Telephone Encounter (Signed)
Pt husband called states pt has received a Music therapist notice.  Requesting a letter excusing her from duty due to medical reasons.  Please advise.

## 2013-08-02 ENCOUNTER — Other Ambulatory Visit: Payer: Self-pay

## 2014-07-18 ENCOUNTER — Emergency Department (HOSPITAL_BASED_OUTPATIENT_CLINIC_OR_DEPARTMENT_OTHER): Payer: Medicare Other

## 2014-07-18 ENCOUNTER — Emergency Department (HOSPITAL_BASED_OUTPATIENT_CLINIC_OR_DEPARTMENT_OTHER)
Admission: EM | Admit: 2014-07-18 | Discharge: 2014-07-18 | Disposition: A | Discharge status: Home / Self Care | Payer: Medicare Other | Attending: Emergency Medicine | Admitting: Emergency Medicine

## 2014-07-18 ENCOUNTER — Encounter (HOSPITAL_BASED_OUTPATIENT_CLINIC_OR_DEPARTMENT_OTHER): Payer: Self-pay | Admitting: Emergency Medicine

## 2014-07-18 DIAGNOSIS — Z85828 Personal history of other malignant neoplasm of skin: Secondary | ICD-10-CM | POA: Diagnosis not present

## 2014-07-18 DIAGNOSIS — Z23 Encounter for immunization: Secondary | ICD-10-CM | POA: Insufficient documentation

## 2014-07-18 DIAGNOSIS — Y929 Unspecified place or not applicable: Secondary | ICD-10-CM | POA: Insufficient documentation

## 2014-07-18 DIAGNOSIS — I10 Essential (primary) hypertension: Secondary | ICD-10-CM | POA: Diagnosis not present

## 2014-07-18 DIAGNOSIS — Z85820 Personal history of malignant melanoma of skin: Secondary | ICD-10-CM | POA: Insufficient documentation

## 2014-07-18 DIAGNOSIS — Z8739 Personal history of other diseases of the musculoskeletal system and connective tissue: Secondary | ICD-10-CM | POA: Diagnosis not present

## 2014-07-18 DIAGNOSIS — Z79899 Other long term (current) drug therapy: Secondary | ICD-10-CM | POA: Diagnosis not present

## 2014-07-18 DIAGNOSIS — Y9301 Activity, walking, marching and hiking: Secondary | ICD-10-CM | POA: Insufficient documentation

## 2014-07-18 DIAGNOSIS — W228XXA Striking against or struck by other objects, initial encounter: Secondary | ICD-10-CM | POA: Insufficient documentation

## 2014-07-18 DIAGNOSIS — S0101XA Laceration without foreign body of scalp, initial encounter: Secondary | ICD-10-CM | POA: Insufficient documentation

## 2014-07-18 DIAGNOSIS — F0390 Unspecified dementia without behavioral disturbance: Secondary | ICD-10-CM | POA: Insufficient documentation

## 2014-07-18 DIAGNOSIS — E785 Hyperlipidemia, unspecified: Secondary | ICD-10-CM | POA: Diagnosis not present

## 2014-07-18 DIAGNOSIS — W19XXXA Unspecified fall, initial encounter: Secondary | ICD-10-CM

## 2014-07-18 DIAGNOSIS — S51012A Laceration without foreign body of left elbow, initial encounter: Secondary | ICD-10-CM | POA: Insufficient documentation

## 2014-07-18 DIAGNOSIS — K219 Gastro-esophageal reflux disease without esophagitis: Secondary | ICD-10-CM | POA: Diagnosis not present

## 2014-07-18 DIAGNOSIS — F411 Generalized anxiety disorder: Secondary | ICD-10-CM | POA: Insufficient documentation

## 2014-07-18 DIAGNOSIS — S80212A Abrasion, left knee, initial encounter: Secondary | ICD-10-CM

## 2014-07-18 MED ORDER — TETANUS-DIPHTH-ACELL PERTUSSIS 5-2.5-18.5 LF-MCG/0.5 IM SUSP
0.5000 mL | Freq: Once | INTRAMUSCULAR | Status: AC
  Administered 2014-07-18: 0.5 mL via INTRAMUSCULAR
  Filled 2014-07-18: qty 0.5

## 2014-07-18 MED ORDER — LIDOCAINE-EPINEPHRINE 2 %-1:100000 IJ SOLN
20.0000 mL | Freq: Once | INTRAMUSCULAR | Status: AC
  Administered 2014-07-18: 20 mL via INTRADERMAL
  Filled 2014-07-18: qty 1

## 2014-07-18 MED ORDER — ACETAMINOPHEN 325 MG PO TABS
650.0000 mg | ORAL_TABLET | Freq: Once | ORAL | Status: AC
  Administered 2014-07-18: 650 mg via ORAL
  Filled 2014-07-18: qty 2

## 2014-07-18 NOTE — ED Notes (Signed)
Pt fee hit head on metal gate  approx 1  To 1 1/2 lac left back scalp  Bleeding controlled.  Abrasion to left knee  w bruising to bilateral lower legs   ?loc

## 2014-07-18 NOTE — Discharge Instructions (Signed)
You have been evaluated for your head injury.  Please have your staples removed in 5-7 days.  Do not remove the sterile strips on your left elbow until it falls off on its own.  Take your home pain medication as needed.  Follow up with your doctor for further care.   Head Injury You have received a head injury. It does not appear serious at this time. Headaches and vomiting are common following head injury. It should be easy to awaken from sleeping. Sometimes it is necessary for you to stay in the emergency department for a while for observation. Sometimes admission to the hospital may be needed. After injuries such as yours, most problems occur within the first 24 hours, but side effects may occur up to 7-10 days after the injury. It is important for you to carefully monitor your condition and contact your health care provider or seek immediate medical care if there is a change in your condition. WHAT ARE THE TYPES OF HEAD INJURIES? Head injuries can be as minor as a bump. Some head injuries can be more severe. More severe head injuries include:  A jarring injury to the brain (concussion).  A bruise of the brain (contusion). This mean there is bleeding in the brain that can cause swelling.  A cracked skull (skull fracture).  Bleeding in the brain that collects, clots, and forms a bump (hematoma). WHAT CAUSES A HEAD INJURY? A serious head injury is most likely to happen to someone who is in a car wreck and is not wearing a seat belt. Other causes of major head injuries include bicycle or motorcycle accidents, sports injuries, and falls. HOW ARE HEAD INJURIES DIAGNOSED? A complete history of the event leading to the injury and your current symptoms will be helpful in diagnosing head injuries. Many times, pictures of the brain, such as CT or MRI are needed to see the extent of the injury. Often, an overnight hospital stay is necessary for observation.  WHEN SHOULD I SEEK IMMEDIATE MEDICAL CARE?  You  should get help right away if:  You have confusion or drowsiness.  You feel sick to your stomach (nauseous) or have continued, forceful vomiting.  You have dizziness or unsteadiness that is getting worse.  You have severe, continued headaches not relieved by medicine. Only take over-the-counter or prescription medicines for pain, fever, or discomfort as directed by your health care provider.  You do not have normal function of the arms or legs or are unable to walk.  You notice changes in the black spots in the center of the colored part of your eye (pupil).  You have a clear or bloody fluid coming from your nose or ears.  You have a loss of vision. During the next 24 hours after the injury, you must stay with someone who can watch you for the warning signs. This person should contact local emergency services (911 in the [place] if you have seizures, you become unconscious, or you are unable to wake up. HOW CAN I PREVENT A HEAD INJURY IN THE FUTURE? The most important factor for preventing major head injuries is avoiding motor vehicle accidents. To minimize the potential for damage to your head, it is crucial to wear seat belts while riding in motor vehicles. Wearing helmets while bike riding and playing collision sports (like football) is also helpful. Also, avoiding dangerous activities around the house will further help reduce your risk of head injury.  WHEN CAN I RETURN TO NORMAL ACTIVITIES AND ATHLETICS?  You should be reevaluated by your health care provider before returning to these activities. If you have any of the following symptoms, you should not return to activities or contact sports until 1 week after the symptoms have stopped:  Persistent headache.  Dizziness or vertigo.  Poor attention and concentration.  Confusion.  Memory problems.  Nausea or vomiting.  Fatigue or tire easily.  Irritability.  Intolerant of bright lights or loud noises.  Anxiety or  depression.  Disturbed sleep. MAKE SURE YOU:   Understand these instructions.  Will watch your condition.  Will get help right away if you are not doing well or get worse. Document Released: 09/13/2005 Document Revised: 09/18/2013 Document Reviewed: 05/21/2013 [facility] Patient Information 2015 [place], [place]. This information is not intended to replace advice given to you by your health care provider. Make sure you discuss any questions you have with your health care provider.  Laceration Care, Adult A laceration is a cut that goes through all layers of the skin. The cut goes into the tissue beneath the skin. HOME CARE For stitches (sutures) or staples:  Keep the cut clean and dry.  If you have a bandage (dressing), change it at least once a day. Change the bandage if it gets wet or dirty, or as told by your doctor.  Wash the cut with soap and water 2 times a day. Rinse the cut with water. Pat it dry with a clean towel.  Put a thin layer of medicated cream on the cut as told by your doctor.  You may shower after the first 24 hours. Do not soak the cut in water until the stitches are removed.  Only take medicines as told by your doctor.  Have your stitches or staples removed as told by your doctor. For skin adhesive strips:  Keep the cut clean and dry.  Do not get the strips wet. You may take a bath, but be careful to keep the cut dry.  If the cut gets wet, pat it dry with a clean towel.  The strips will fall off on their own. Do not remove the strips that are still stuck to the cut. For wound glue:  You may shower or take baths. Do not soak or scrub the cut. Do not swim. Avoid heavy sweating until the glue falls off on its own. After a shower or bath, pat the cut dry with a clean towel.  Do not put medicine on your cut until the glue falls off.  If you have a bandage, do not put tape over the glue.  Avoid lots of sunlight or tanning lamps until the glue falls off. Put  sunscreen on the cut for the first year to reduce your scar.  The glue will fall off on its own. Do not pick at the glue. You may need a tetanus shot if:  You cannot remember when you had your last tetanus shot.  You have never had a tetanus shot. If you need a tetanus shot and you choose not to have one, you may get tetanus. Sickness from tetanus can be serious. GET HELP RIGHT AWAY IF:   Your pain does not get better with medicine.  Your arm, hand, leg, or foot loses feeling (numbness) or changes color.  Your cut is bleeding.  Your joint feels weak, or you cannot use your joint.  You have painful lumps on your body.  Your cut is red, puffy (swollen), or painful.  You have a red line on  the skin near the cut.  You have yellowish-white fluid (pus) coming from the cut.  You have a fever.  You have a bad smell coming from the cut or bandage.  Your cut breaks open before or after stitches are removed.  You notice something coming out of the cut, such as wood or glass.  You cannot move a finger or toe. MAKE SURE YOU:   Understand these instructions.  Will watch your condition.  Will get help right away if you are not doing well or get worse. Document Released: 03/01/2008 Document Revised: 12/06/2011 Document Reviewed: 03/09/2011 [facility] Patient Information 2015 [place], [place]. This information is not intended to replace advice given to you by your health care provider. Make sure you discuss any questions you have with your health care provider.

## 2014-07-18 NOTE — ED Provider Notes (Signed)
Medical screening examination/treatment/procedure(s) were performed by non-physician practitioner and as supervising physician I was immediately available for consultation/collaboration.   EKG Interpretation None        Wandra Arthurs, MD 07/18/14 [id number]

## 2014-07-18 NOTE — ED Notes (Signed)
Patient states she was walking and fell due to a gait and hip problem, striking her head on the animal gate.  Laceration to left scalp and left knee.

## 2014-07-18 NOTE — ED Provider Notes (Signed)
CSN: [id number]     Arrival date & time 07/18/14  1904 History   First MD Initiated Contact with Patient 07/18/14 1928     Chief Complaint  Patient presents with  . Head Laceration     (Consider location/radiation/quality/duration/timing/severity/associated sxs/prior Treatment) HPI  68 year old female with history of degenerative disc disease, low back pain, alcohol abuse presents for evaluation of head injury. Patient with history of dementia, history obtained through husband who is at bedside. Husband mention he was washing dishes, and heard that his wife fell behind him striking her head against a door for the dog, and fell to the ground.  He mentioned that it is a controlled fall. She suffered a head laceration, injuring her left elbow and left knee from the fall. Denies any precipitating symptoms. No loss of consciousness. Patient is not on blood thinner medication. She has had several recurrent falls since fracturing her hip from a fall in the past. Patient is also an alcoholic and did drink 2 glasses of wine over the period of 5 hours. At this time patient denies having any active headache, neck pain, chest pain, trouble breathing, abdominal pain, back pain, elbow pain, knee pain, or any other complaint.  Does not recall last tetanus.    Past Medical History  Diagnosis Date  . MELANOMA, HX OF 08/04/2007  . ALLERGIC RHINITIS 06/23/2007  . ANXIETY DEPRESSION 06/12/2009  . ANXIETY 06/23/2007  . 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 GERD 06/23/2007  . HYPERLIPIDEMIA 06/24/2007  . HYPERTENSION 06/23/2007  . COLONIC POLYPS, HX OF 03/08/2002  . DIVERTICULOSIS, COLON 08/04/2007  . Irritable bowel syndrome 06/23/2007  . Memory dysfunction 06/23/2011  . Cancer     skin cx/ hx melanoma  . LOW BACK PAIN 06/24/2007  . Alcohol abuse 02/27/2012   Past Surgical History  Procedure Laterality Date  . Cesarean section      x 2  . Colonoscopy    . Appendectomy     Family History   Problem Relation Age of Onset  . Hypertension Other   . Diabetes Other   . Colon cancer Cousin    History  Substance Use Topics  . Smoking status: Never Smoker   . Smokeless tobacco: Never Used  . Alcohol Use: 25.2 oz/week    42 Glasses of wine per week   OB History   Grav Para Term Preterm Abortions TAB SAB Ect Mult Living                 Review of Systems  Unable to perform ROS: Dementia      Allergies  Codeine phosphate  Home Medications   Prior to Admission medications   Medication Sig Start Date End Date Taking? Authorizing Provider  amLODipine (NORVASC) 10 MG tablet Take 1 tablet (10 mg total) by mouth daily. 09/18/12   Biagio Borg, MD  citalopram (CELEXA) 40 MG tablet Take 1 tablet (40 mg total) by mouth daily. 09/18/12   Biagio Borg, MD  clonazePAM (KLONOPIN) 0.5 MG tablet TAKE 1 TABLET BY MOUTH TWICE DAILY AS NEEDED FOR ANXIETY; to fill Sep 21, 2012 09/04/12   Biagio Borg, MD  dexlansoprazole (DEXILANT) 60 MG capsule Take 1 capsule (60 mg total) by mouth daily. 03/21/12 04/20/12  Biagio Borg, MD  eszopiclone (LUNESTA) 2 MG TABS Take 1 tablet (2 mg total) by mouth at bedtime as needed. Take immediately before bedtime 09/27/12   Biagio Borg, MD  oxyCODONE (OXY IR/ROXICODONE) 5 MG  immediate release tablet 1-2 tabs every 6 hrs as needed for pain 01/01/13   Biagio Borg, MD  zolpidem (AMBIEN CR) 6.25 MG CR tablet Take 1 tablet (6.25 mg total) by mouth at bedtime as needed. For sleep 09/18/12   Biagio Borg, MD   BP 148/99  Temp(Src) 97.6 F (36.4 C) (Oral)  Resp 20  Ht 5\' 5"  (1.651 m)  Wt 125 lb (56.7 kg)  BMI 20.80 kg/m2  SpO2 100% Physical Exam  Nursing note and vitals reviewed. Constitutional:   Caucasian female who appears to be in no acute distress  HENT:  Head: Normocephalic.  2 cm horizontal laceration noted to occiput of scalp not actively bleeding, no crepitus noted, mildly tender to palpation  No hemotympanum, no septal hematoma, no malocclusion, no  midface tenderness to  Eyes: Conjunctivae and EOM are normal. Pupils are equal, round, and reactive to light.  Neck: Normal range of motion. Neck supple.  No cervical midline spine tenderness  Cardiovascular: Normal rate, regular rhythm and intact distal pulses.   Pulmonary/Chest: Effort normal and breath sounds normal.  Abdominal: Soft. There is no tenderness.  Neurological:  Patient alert to self only, and this is her baseline according to husband.  Able to move all 4 extremities without difficulty  Skin:  Skin tear noted to left posterior elbow with normal elbow flexion and extension and no crepitus and no deformity  Skin tear noted to left anterior knee with normal knee flexion and extension without any tenderness.    ED Course  Procedures (including critical care time)  Patient here with a witnessed fall, history of recurrent fall, history of alcohol abuse. She had a head laceration, skin tears left elbow and left knee. Home care provider, head CT scan ordered, T.dap given. Will perform scalp closure with surgical staples.  9:33 PM Head CT shows no evidence of intracranial hemorrhage or skull fracture. No subdural hemorrhage.  Scalp laceration was anesthetized and surgically stapled by me. And left elbow superficial laceration with Dermabond and sterile stripped. Left anterior knee abrasion was cleaned, and dressing applied. I offer resources for outpatient followup for alcohol abuse. Return precautions discussed. Patient will follow up closely with PCP for further care. Patient is able to ambulate. She has no specific complaint at this time.  Pt has pain medication at home which she can use.    LACERATION REPAIR Performed by: Domenic Moras Authorized byDomenic Moras Consent: Verbal consent obtained. Risks and benefits: risks, benefits and alternatives were discussed Consent given by: patient Patient identity confirmed: provided demographic data Prepped and Draped in normal sterile  fashion Wound explored  Laceration Location: L occipital scalp  Laceration Length: 2cm  No Foreign Bodies seen or palpated  Anesthesia: local infiltration  Local anesthetic: lidocaine 2% w epinephrine  Anesthetic total: 3 ml  Irrigation method: syringe Amount of cleaning: standard  Skin closure: surgical staples  Number of staples: 5  Technique: surgical staples  Patient tolerance: Patient tolerated the procedure well with no immediate complications.  LACERATION REPAIR Performed by: Domenic Moras Authorized byDomenic Moras Consent: Verbal consent obtained. Risks and benefits: risks, benefits and alternatives were discussed Consent given by: patient Patient identity confirmed: provided demographic data Prepped and Draped in normal sterile fashion Wound explored  Laceration Location: L elbow  Laceration Length: 1cm  No Foreign Bodies seen or palpated  Anesthesia: local infiltration  Local anesthetic: lidocaine 2% w epinephrine  Anesthetic total: 1 ml  Irrigation method: syringe Amount of cleaning: standard  Skin closure:  dermabond and sterile strips  Number of sutures: dermabond and sterile strips  Technique: dermabond and sterile strips  Patient tolerance: Patient tolerated the procedure well with no immediate complications.     Labs Review Labs Reviewed - No data to display  Imaging Review Dg Elbow Complete Left  07/18/2014   CLINICAL DATA:  Fall today, left elbow pain  EXAM: LEFT ELBOW - COMPLETE 3+ VIEW  COMPARISON:  None.  FINDINGS: Four views of left elbow submitted. No acute fracture or subluxation. No posterior fat pad sign. No radiopaque foreign body.  IMPRESSION: Negative.   Electronically Signed   By: Lahoma Crocker M.D.   On: 07/18/2014 20:47   Ct Head Wo Contrast  07/18/2014   CLINICAL DATA:  Fall today, hit head on metal gate, left back scalp laceration  EXAM: CT HEAD WITHOUT CONTRAST  TECHNIQUE: Contiguous axial images were obtained from the  base of the skull through the vertex without intravenous contrast.  COMPARISON:  04/08/2012  FINDINGS: No skull fracture is noted. There is scalp swelling and subcutaneous stranding and left frontal region. There is mild scalp swelling and skin laceration left posterior scalp axial image 22.  No intracranial hemorrhage, mass effect or midline shift. Stable atrophy and chronic white matter disease. No acute cortical infarction. No mass lesion is noted on this unenhanced scan.  IMPRESSION: No acute intracranial abnormality. Stable atrophy and chronic white matter disease. There is focal scalp swelling and subcutaneous stranding in left frontal region see axial image 17. Mild scalp swelling in subcutaneous stranding and skin laceration in left posterior parietal scalp see axial image 22.   Electronically Signed   By: Lahoma Crocker M.D.   On: 07/18/2014 20:46     EKG Interpretation None      MDM   Final diagnoses:  Scalp laceration  Fall  Elbow laceration, left, initial encounter  Knee abrasion, left, initial encounter    BP 138/89  Pulse 98  Temp(Src) 98.8 F (37.1 C) (Oral)  Resp 18  Ht 5\' 5"  (1.651 m)  Wt 125 lb (56.7 kg)  BMI 20.80 kg/m2  SpO2 98%  I have reviewed nursing notes and vital signs. I personally reviewed the imaging tests through PACS system  I reviewed available ER/hospitalization records thought the EMR     Domenic Moras, [place] 07/18/14 2143

## 2014-07-18 NOTE — ED Notes (Signed)
Patient transported to CT 

## 2014-07-31 ENCOUNTER — Encounter: Payer: Self-pay | Admitting: Internal Medicine

## 2014-07-31 ENCOUNTER — Ambulatory Visit (INDEPENDENT_AMBULATORY_CARE_PROVIDER_SITE_OTHER): Payer: Medicare Other | Admitting: Internal Medicine

## 2014-07-31 VITALS — BP 132/88 | HR 99 | Temp 97.9°F | Wt 123.4 lb

## 2014-07-31 DIAGNOSIS — I10 Essential (primary) hypertension: Secondary | ICD-10-CM

## 2014-07-31 DIAGNOSIS — Z23 Encounter for immunization: Secondary | ICD-10-CM

## 2014-07-31 DIAGNOSIS — S0101XD Laceration without foreign body of scalp, subsequent encounter: Secondary | ICD-10-CM

## 2014-07-31 DIAGNOSIS — F341 Dysthymic disorder: Secondary | ICD-10-CM

## 2014-07-31 NOTE — Progress Notes (Signed)
Pre visit review using our clinic review tool, if applicable. No additional management support is needed unless otherwise documented below in the visit note. 

## 2014-07-31 NOTE — Progress Notes (Signed)
Subjective:    Patient ID: Tara Shaffer, female    DOB: July 09, 1946, 68 y.o.   MRN: [id number]  HPI  Here to f/u recent ER visit for fall with scalp laceration, bled much per husband "like a murder scene" but has done well after 4 staples, now 7 days out, without laceration area red/tender/swelling.  Pt denies chest pain, increased sob or doe, wheezing, orthopnea, PND, increased LE swelling, palpitations, dizziness or syncope.  Pt denies new neurological symptoms such as new headache, or facial or extremity weakness or numbness   Pt denies polydipsia, polyuria, Denies worsening depressive symptoms, suicidal ideation, or panic; has ongoing anxiety, no change per husband. Due for prevnar Past Medical History  Diagnosis Date  . MELANOMA, HX OF 08/04/2007  . ALLERGIC RHINITIS 06/23/2007  . ANXIETY DEPRESSION 06/12/2009  . ANXIETY 06/23/2007  . 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 GERD 06/23/2007  . HYPERLIPIDEMIA 06/24/2007  . HYPERTENSION 06/23/2007  . COLONIC POLYPS, HX OF 03/08/2002  . DIVERTICULOSIS, COLON 08/04/2007  . Irritable bowel syndrome 06/23/2007  . Memory dysfunction 06/23/2011  . Cancer     skin cx/ hx melanoma  . LOW BACK PAIN 06/24/2007  . Alcohol abuse 02/27/2012   Past Surgical History  Procedure Laterality Date  . Cesarean section      x 2  . Colonoscopy    . Appendectomy      reports that she has never smoked. She has never used smokeless tobacco. She reports that she drinks about 25.2 oz of alcohol per week. She reports that she does not use illicit drugs. family history includes Colon cancer in her cousin; Diabetes in her other; Hypertension in her other. Allergies  Allergen Reactions  . Codeine Phosphate Nausea Only   Current Outpatient Prescriptions on File Prior to Visit  Medication Sig Dispense Refill  . amLODipine (NORVASC) 10 MG tablet Take 1 tablet (10 mg total) by mouth daily. 90 tablet 3  . citalopram (CELEXA) 40 MG tablet Take 1 tablet  (40 mg total) by mouth daily. 90 tablet 3  . clonazePAM (KLONOPIN) 0.5 MG tablet TAKE 1 TABLET BY MOUTH TWICE DAILY AS NEEDED FOR ANXIETY; to fill Sep 21, 2012 60 tablet 3  . dexlansoprazole (DEXILANT) 60 MG capsule Take 1 capsule (60 mg total) by mouth daily. 90 capsule 3  . eszopiclone (LUNESTA) 2 MG TABS Take 1 tablet (2 mg total) by mouth at bedtime as needed. Take immediately before bedtime 30 tablet 2  . oxyCODONE (OXY IR/ROXICODONE) 5 MG immediate release tablet 1-2 tabs every 6 hrs as needed for pain 60 tablet 0  . zolpidem (AMBIEN CR) 6.25 MG CR tablet Take 1 tablet (6.25 mg total) by mouth at bedtime as needed. For sleep 90 tablet 1   No current facility-administered medications on file prior to visit.   Review of Systems  Constitutional: Negative for unusual diaphoresis or other sweats  HENT: Negative for ringing in ear Eyes: Negative for double vision or worsening visual disturbance.  Respiratory: Negative for choking and stridor.   Gastrointestinal: Negative for vomiting or other signifcant bowel change Genitourinary: Negative for hematuria or decreased urine volume.  Musculoskeletal: Negative for other MSK pain or swelling Skin: Negative for color change and worsening wound.  Neurological: Negative for tremors and numbness other than noted  Psychiatric/Behavioral: Negative for decreased concentration or agitation other than above       Objective:   Physical Exam BP 132/88 mmHg  Pulse 99  Temp(Src) 97.9  F (36.6 C) (Oral)  Wt 123 lb 6 oz (55.963 kg)  SpO2 98% VS noted,  Constitutional: Pt appears well-developed, well-nourished.  HENT: Head: NCAT.  Right Ear: External ear normal.  Left Ear: External ear normal.  Eyes: . Pupils are equal, round, and reactive to light. Conjunctivae and EOM are normal Neck: Normal range of motion. Neck supple.  Cardiovascular: Normal rate and regular rhythm.   Pulmonary/Chest: Effort normal and breath sounds normal.  Neurological: Pt  is alert. Not confused , motor grossly intact Skin: Skin is warm. Scalp laceration intact without red/tender/sweling or drainage, 4 staples removed Psychiatric: Pt behavior is normal. No agitation. Alert, mild dysphoric appearance, mild nervous    Assessment & Plan:

## 2014-07-31 NOTE — Patient Instructions (Signed)
You had the Prevnar pneumonia shot today  4 staples were removed today  Please continue all other medications as before  Please have the pharmacy call with any other refills you may need.  Please keep your appointments with your specialists as you may have planned  Please return in 3 months, or sooner if needed

## 2014-08-01 ENCOUNTER — Telehealth: Payer: Self-pay | Admitting: Internal Medicine

## 2014-08-01 NOTE — Telephone Encounter (Signed)
emmi emailed °

## 2014-08-04 DIAGNOSIS — S0101XA Laceration without foreign body of scalp, initial encounter: Secondary | ICD-10-CM | POA: Insufficient documentation

## 2014-08-04 NOTE — Assessment & Plan Note (Signed)
stable overall by history and exam, recent data reviewed with pt and husband, and pt to continue medical treatment as before,  to f/u any worsening symptoms or concerns Lab Results  Component Value Date   WBC 9.2 09/06/2012   HGB 14.6 09/06/2012   HCT 43.0 09/06/2012   PLT 234.0 09/06/2012   GLUCOSE 112* 09/06/2012   CHOL 211* 09/06/2012   TRIG 75.0 09/06/2012   HDL 78.10 09/06/2012   LDLDIRECT 120.9 09/06/2012   LDLCALC 64 08/12/2010   ALT 18 09/06/2012   AST 25 09/06/2012   NA 137 09/06/2012   K 3.7 09/06/2012   CL 101 09/06/2012   CREATININE 0.8 09/06/2012   BUN 10 09/06/2012   CO2 26 09/06/2012   TSH 2.43 09/06/2012   INR 1.06 04/09/2012   HGBA1C 5.2 09/07/2012

## 2014-08-04 NOTE — Assessment & Plan Note (Signed)
stable overall by history and exam, recent data reviewed with pt, and pt to continue medical treatment as before,  to f/u any worsening symptoms or concerns BP Readings from Last 3 Encounters:  07/31/14 132/88  07/18/14 138/89  12/19/12 162/92

## 2014-08-04 NOTE — Assessment & Plan Note (Signed)
Intact, healing well, staples removed, no further tx needed at this time

## 2014-10-31 ENCOUNTER — Other Ambulatory Visit (INDEPENDENT_AMBULATORY_CARE_PROVIDER_SITE_OTHER): Payer: Medicare Other

## 2014-10-31 ENCOUNTER — Ambulatory Visit (INDEPENDENT_AMBULATORY_CARE_PROVIDER_SITE_OTHER): Payer: Medicare Other | Admitting: Internal Medicine

## 2014-10-31 ENCOUNTER — Encounter: Payer: Self-pay | Admitting: Internal Medicine

## 2014-10-31 VITALS — BP 122/80 | HR 117 | Temp 97.7°F | Ht 65.0 in | Wt 126.0 lb

## 2014-10-31 DIAGNOSIS — R7302 Impaired glucose tolerance (oral): Secondary | ICD-10-CM

## 2014-10-31 DIAGNOSIS — F1010 Alcohol abuse, uncomplicated: Secondary | ICD-10-CM

## 2014-10-31 DIAGNOSIS — I10 Essential (primary) hypertension: Secondary | ICD-10-CM

## 2014-10-31 DIAGNOSIS — R296 Repeated falls: Secondary | ICD-10-CM

## 2014-10-31 DIAGNOSIS — F341 Dysthymic disorder: Secondary | ICD-10-CM

## 2014-10-31 LAB — CBC WITH DIFFERENTIAL/PLATELET
Basophils Absolute: 0 10*3/uL (ref 0.0–0.1)
Basophils Relative: 0 % (ref 0.0–3.0)
EOS ABS: 0.2 10*3/uL (ref 0.0–0.7)
Eosinophils Relative: 1.8 % (ref 0.0–5.0)
HEMATOCRIT: 46.7 % — AB (ref 36.0–46.0)
HEMOGLOBIN: 16 g/dL — AB (ref 12.0–15.0)
LYMPHS ABS: 1.2 10*3/uL (ref 0.7–4.0)
Lymphocytes Relative: 12.6 % (ref 12.0–46.0)
MCHC: 34.3 g/dL (ref 30.0–36.0)
MCV: 102.5 fl — ABNORMAL HIGH (ref 78.0–100.0)
Monocytes Absolute: 0.2 10*3/uL (ref 0.1–1.0)
Monocytes Relative: 2 % — ABNORMAL LOW (ref 3.0–12.0)
NEUTROS PCT: 83.6 % — AB (ref 43.0–77.0)
Neutro Abs: 8.3 10*3/uL — ABNORMAL HIGH (ref 1.4–7.7)
Platelets: 355 10*3/uL (ref 150.0–400.0)
RBC: 4.55 Mil/uL (ref 3.87–5.11)
RDW: 15.1 % (ref 11.5–15.5)
WBC: 9.9 10*3/uL (ref 4.0–10.5)

## 2014-10-31 LAB — BASIC METABOLIC PANEL
BUN: 11 mg/dL (ref 6–23)
CO2: 23 meq/L (ref 19–32)
Calcium: 9.2 mg/dL (ref 8.4–10.5)
Chloride: 102 mEq/L (ref 96–112)
Creatinine, Ser: 1.14 mg/dL (ref 0.40–1.20)
GFR: 50.47 mL/min — ABNORMAL LOW (ref >60.00)
Glucose, Bld: 105 mg/dL — ABNORMAL HIGH (ref 70–99)
Potassium: 3.4 mEq/L — ABNORMAL LOW (ref 3.5–5.1)
SODIUM: 136 meq/L (ref 135–145)

## 2014-10-31 LAB — HEPATIC FUNCTION PANEL
ALBUMIN: 3.7 g/dL (ref 3.5–5.2)
ALT: 12 U/L (ref 0–35)
AST: 17 U/L (ref 0–37)
Alkaline Phosphatase: 197 U/L — ABNORMAL HIGH (ref 39–117)
BILIRUBIN DIRECT: 0.3 mg/dL (ref 0.0–0.3)
TOTAL PROTEIN: 7.9 g/dL (ref 6.0–8.3)
Total Bilirubin: 0.8 mg/dL (ref 0.2–1.2)

## 2014-10-31 LAB — LIPID PANEL
Cholesterol: 170 mg/dL (ref 0–200)
HDL: 74 mg/dL (ref >39.00)
LDL CALC: 74 mg/dL (ref 0–99)
NonHDL: 96
TRIGLYCERIDES: 108 mg/dL (ref 0.0–149.0)
Total CHOL/HDL Ratio: 2
VLDL: 21.6 mg/dL (ref 0.0–40.0)

## 2014-10-31 LAB — TSH: TSH: 4.39 u[IU]/mL (ref 0.35–4.50)

## 2014-10-31 LAB — HEMOGLOBIN A1C: HEMOGLOBIN A1C: 4.8 % (ref 4.6–6.5)

## 2014-10-31 NOTE — Progress Notes (Signed)
Pre visit review using our clinic review tool, if applicable. No additional management support is needed unless otherwise documented below in the visit note. 

## 2014-10-31 NOTE — Assessment & Plan Note (Signed)
Needs to abstain/stop - again asked husband to not enable her

## 2014-10-31 NOTE — Assessment & Plan Note (Signed)
stable overall by history and exam, , and pt to continue medical treatment as before,  to f/u any worsening symptoms or concerns  

## 2014-10-31 NOTE — Assessment & Plan Note (Signed)
Has tried PT in past, but unable to cooperate well, husband declines for her

## 2014-10-31 NOTE — Assessment & Plan Note (Signed)
stable overall by history and exam, recent data reviewed with pt, and pt to continue medical treatment as before,  to f/u any worsening symptoms or concerns Lab Results  Component Value Date   WBC 9.2 09/06/2012   HGB 14.6 09/06/2012   HCT 43.0 09/06/2012   PLT 234.0 09/06/2012   GLUCOSE 112* 09/06/2012   CHOL 211* 09/06/2012   TRIG 75.0 09/06/2012   HDL 78.10 09/06/2012   LDLDIRECT 120.9 09/06/2012   LDLCALC 64 08/12/2010   ALT 18 09/06/2012   AST 25 09/06/2012   NA 137 09/06/2012   K 3.7 09/06/2012   CL 101 09/06/2012   CREATININE 0.8 09/06/2012   BUN 10 09/06/2012   CO2 26 09/06/2012   TSH 2.43 09/06/2012   INR 1.06 04/09/2012   HGBA1C 5.2 09/07/2012

## 2014-10-31 NOTE — Assessment & Plan Note (Signed)
stable overall by history and exam, recent data reviewed with pt, and pt to continue medical treatment as before,  to f/u any worsening symptoms or concerns Lab Results  Component Value Date   HGBA1C 5.2 09/07/2012   For f/u labs today

## 2014-10-31 NOTE — Patient Instructions (Signed)
Please start aspirin 81 mg - 1 per day  Please continue your efforts at being more active, low cholesterol diet, and weight control.  Please keep your appointments with your specialists as you may have planned  Please go to the LAB in the Basement (turn left off the elevator) for the tests to be done today  You will be contacted by phone if any changes need to be made immediately.  Otherwise, you will receive a letter about your results with an explanation, but please check with MyChart first.  Please remember to sign up for MyChart if you have not done so, as this will be important to you in the future with finding out test results, communicating by private email, and scheduling acute appointments online when needed.  Please return in 6 months, or sooner if needed

## 2014-10-31 NOTE — Progress Notes (Signed)
Subjective:    Patient ID: Tara Shaffer, female    DOB: 1946/03/05, 69 y.o.   MRN: [id number]  HPI  Here to f/u with husband; pt with chronic dementia (?ETOH related I suspect)  overall doing ok,  Pt denies chest pain, increased sob or doe, wheezing, orthopnea, PND, increased LE swelling, palpitations, dizziness or syncope.  Pt denies polydipsia, polyuria, or low sugar symptoms such as weakness or confusion improved with po intake.  Pt denies new neurological symptoms such as new headache, or facial or extremity weakness or numbness.   Pt husband states she essentially refuses to take prescribed oral med and has been taking none for many months.   Pt denies fever, wt loss, night sweats, loss of appetite, or other constitutional symptoms Wt Readings from Last 3 Encounters:  10/31/14 126 lb (57.153 kg)  07/31/14 123 lb 6 oz (55.963 kg)  07/18/14 125 lb (56.7 kg)  Pt cont's approx 1 bottle liquour per day, husband admits he enables her but for some reason cant stop doing this. Pt not taking asa for chemoprevention. No recent n/v or other w/d symptoms, no hx of DT's or siezure.  Has had mult falls in past but had done quite well in last 2 months until incidentally last evening when she tipped forward getting out of car and hit left forehead and upper lip to concrete, no LOC, and behavior/mentation no change, no overt neuro symtpoms, no bleeding or wounds, just bruising and swollen lip. Denies worsening depressive symptoms, suicidal ideation, or panic Past Medical History  Diagnosis Date  . MELANOMA, HX OF 08/04/2007  . ALLERGIC RHINITIS 06/23/2007  . ANXIETY DEPRESSION 06/12/2009  . ANXIETY 06/23/2007  . 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 GERD 06/23/2007  . HYPERLIPIDEMIA 06/24/2007  . HYPERTENSION 06/23/2007  . COLONIC POLYPS, HX OF 03/08/2002  . DIVERTICULOSIS, COLON 08/04/2007  . Irritable bowel syndrome 06/23/2007  . Memory dysfunction 06/23/2011  . Cancer     skin cx/ hx  melanoma  . LOW BACK PAIN 06/24/2007  . Alcohol abuse 02/27/2012   Past Surgical History  Procedure Laterality Date  . Cesarean section      x 2  . Colonoscopy    . Appendectomy      reports that she has never smoked. She has never used smokeless tobacco. She reports that she drinks about 25.2 oz of alcohol per week. She reports that she does not use illicit drugs. family history includes Colon cancer in her cousin; Diabetes in her other; Hypertension in her other. Allergies  Allergen Reactions  . Codeine Phosphate Nausea Only   No current outpatient prescriptions on file prior to visit.   No current facility-administered medications on file prior to visit.   Review of Systems Unable due to pt dementia    Objective:   Physical Exam BP 122/80 mmHg  Pulse 117  Temp(Src) 97.7 F (36.5 C) (Oral)  Ht 5\' 5"  (1.651 m)  Wt 126 lb (57.153 kg)  BMI 20.97 kg/m2  SpO2 97% VS noted,  Constitutional: Pt appears well-developed, well-nourished.  HENT: Head: NCAT.  Right Ear: External ear normal.  Left Ear: External ear normal.  Mid upper lip with 1-2+ swelling/abrasion mild tender without bleeding, no teeth injury Eyes: . Pupils are equal, round, and reactive to light. Conjunctivae and EOM are normal Neck: Normal range of motion. Neck supple.  Cardiovascular: Normal rate and regular rhythm.   Pulmonary/Chest: Effort normal and breath sounds without rales or wheezing.  Abd:  Soft, NT, ND, + BS Neurological: Pt is alert. At baseline confused , motor grossly intact Skin: Skin is warm. No rash, does have 1 x 1 cm bruised/abrased area left forehead without significant sweling or bleeding Psychiatric: Pt behavior is normal. No agitation.     Assessment & Plan:

## 2015-03-03 ENCOUNTER — Telehealth: Payer: Self-pay | Admitting: Internal Medicine

## 2015-03-03 NOTE — Telephone Encounter (Signed)
No answer - attempted to contact to verify up to date mammogram status

## 2015-03-13 ENCOUNTER — Emergency Department (HOSPITAL_COMMUNITY): Payer: Medicare Other

## 2015-03-13 ENCOUNTER — Inpatient Hospital Stay (HOSPITAL_COMMUNITY)
Admission: EM | Admit: 2015-03-13 | Discharge: 2015-03-18 | DRG: 535 | Disposition: A | Discharge status: Skilled Nursing Facility | Payer: Medicare Other | Attending: Internal Medicine | Admitting: Internal Medicine

## 2015-03-13 ENCOUNTER — Encounter (HOSPITAL_COMMUNITY): Payer: Self-pay | Admitting: *Deleted

## 2015-03-13 DIAGNOSIS — F1097 Alcohol use, unspecified with alcohol-induced persisting dementia: Secondary | ICD-10-CM | POA: Diagnosis present

## 2015-03-13 DIAGNOSIS — Z681 Body mass index (BMI) 19 or less, adult: Secondary | ICD-10-CM | POA: Diagnosis not present

## 2015-03-13 DIAGNOSIS — E876 Hypokalemia: Secondary | ICD-10-CM | POA: Diagnosis present

## 2015-03-13 DIAGNOSIS — W19XXXA Unspecified fall, initial encounter: Secondary | ICD-10-CM | POA: Diagnosis present

## 2015-03-13 DIAGNOSIS — M24561 Contracture, right knee: Secondary | ICD-10-CM | POA: Diagnosis present

## 2015-03-13 DIAGNOSIS — E785 Hyperlipidemia, unspecified: Secondary | ICD-10-CM | POA: Diagnosis present

## 2015-03-13 DIAGNOSIS — L8990 Pressure ulcer of unspecified site, unspecified stage: Secondary | ICD-10-CM | POA: Diagnosis not present

## 2015-03-13 DIAGNOSIS — F0390 Unspecified dementia without behavioral disturbance: Secondary | ICD-10-CM | POA: Diagnosis not present

## 2015-03-13 DIAGNOSIS — G312 Degeneration of nervous system due to alcohol: Secondary | ICD-10-CM | POA: Diagnosis present

## 2015-03-13 DIAGNOSIS — R262 Difficulty in walking, not elsewhere classified: Secondary | ICD-10-CM

## 2015-03-13 DIAGNOSIS — E440 Moderate protein-calorie malnutrition: Secondary | ICD-10-CM | POA: Insufficient documentation

## 2015-03-13 DIAGNOSIS — Z85820 Personal history of malignant melanoma of skin: Secondary | ICD-10-CM

## 2015-03-13 DIAGNOSIS — M24562 Contracture, left knee: Secondary | ICD-10-CM | POA: Diagnosis present

## 2015-03-13 DIAGNOSIS — Z01818 Encounter for other preprocedural examination: Secondary | ICD-10-CM

## 2015-03-13 DIAGNOSIS — L89153 Pressure ulcer of sacral region, stage 3: Secondary | ICD-10-CM | POA: Diagnosis present

## 2015-03-13 DIAGNOSIS — M25551 Pain in right hip: Secondary | ICD-10-CM | POA: Diagnosis not present

## 2015-03-13 DIAGNOSIS — Z8249 Family history of ischemic heart disease and other diseases of the circulatory system: Secondary | ICD-10-CM | POA: Diagnosis not present

## 2015-03-13 DIAGNOSIS — R531 Weakness: Secondary | ICD-10-CM

## 2015-03-13 DIAGNOSIS — F1010 Alcohol abuse, uncomplicated: Secondary | ICD-10-CM | POA: Diagnosis present

## 2015-03-13 DIAGNOSIS — Z833 Family history of diabetes mellitus: Secondary | ICD-10-CM

## 2015-03-13 DIAGNOSIS — I10 Essential (primary) hypertension: Secondary | ICD-10-CM | POA: Diagnosis present

## 2015-03-13 DIAGNOSIS — N390 Urinary tract infection, site not specified: Secondary | ICD-10-CM | POA: Diagnosis not present

## 2015-03-13 DIAGNOSIS — R5383 Other fatigue: Secondary | ICD-10-CM

## 2015-03-13 DIAGNOSIS — S72001A Fracture of unspecified part of neck of right femur, initial encounter for closed fracture: Secondary | ICD-10-CM | POA: Diagnosis present

## 2015-03-13 DIAGNOSIS — Z9119 Patient's noncompliance with other medical treatment and regimen: Secondary | ICD-10-CM | POA: Diagnosis present

## 2015-03-13 LAB — CBC WITH DIFFERENTIAL/PLATELET
BASOS PCT: 0 % (ref 0–1)
Basophils Absolute: 0 10*3/uL (ref 0.0–0.1)
EOS ABS: 0.2 10*3/uL (ref 0.0–0.7)
EOS PCT: 2 % (ref 0–5)
HCT: 34.1 % — ABNORMAL LOW (ref 36.0–46.0)
HEMOGLOBIN: 11.3 g/dL — AB (ref 12.0–15.0)
LYMPHS ABS: 1.3 10*3/uL (ref 0.7–4.0)
Lymphocytes Relative: 13 % (ref 12–46)
MCH: 30.5 pg (ref 26.0–34.0)
MCHC: 33.1 g/dL (ref 30.0–36.0)
MCV: 91.9 fL (ref 78.0–100.0)
Monocytes Absolute: 0.9 10*3/uL (ref 0.1–1.0)
Monocytes Relative: 9 % (ref 3–12)
NEUTROS PCT: 76 % (ref 43–77)
Neutro Abs: 7.4 10*3/uL (ref 1.7–7.7)
PLATELETS: 441 10*3/uL — AB (ref 150–400)
RBC: 3.71 MIL/uL — ABNORMAL LOW (ref 3.87–5.11)
RDW: 12.8 % (ref 11.5–15.5)
WBC: 9.8 10*3/uL (ref 4.0–10.5)

## 2015-03-13 LAB — URINALYSIS, ROUTINE W REFLEX MICROSCOPIC
Glucose, UA: NEGATIVE mg/dL
KETONES UR: NEGATIVE mg/dL
Nitrite: NEGATIVE
PROTEIN: 30 mg/dL — AB
Specific Gravity, Urine: 1.017 (ref 1.005–1.030)
UROBILINOGEN UA: 1 mg/dL (ref 0.0–1.0)
pH: 7 (ref 5.0–8.0)

## 2015-03-13 LAB — COMPREHENSIVE METABOLIC PANEL
ALT: 15 U/L (ref 14–54)
ANION GAP: 9 (ref 5–15)
AST: 24 U/L (ref 15–41)
Albumin: 2.8 g/dL — ABNORMAL LOW (ref 3.5–5.0)
Alkaline Phosphatase: 140 U/L — ABNORMAL HIGH (ref 38–126)
BUN: 21 mg/dL — ABNORMAL HIGH (ref 6–20)
CALCIUM: 8.6 mg/dL — AB (ref 8.9–10.3)
CHLORIDE: 100 mmol/L — AB (ref 101–111)
CO2: 25 mmol/L (ref 22–32)
Creatinine, Ser: 0.99 mg/dL (ref 0.44–1.00)
GFR calc non Af Amer: 58 mL/min — ABNORMAL LOW (ref >60)
Glucose, Bld: 95 mg/dL (ref 65–99)
POTASSIUM: 3.5 mmol/L (ref 3.5–5.1)
Sodium: 134 mmol/L — ABNORMAL LOW (ref 135–145)
TOTAL PROTEIN: 7.2 g/dL (ref 6.5–8.1)
Total Bilirubin: 0.8 mg/dL (ref 0.3–1.2)

## 2015-03-13 LAB — URINE MICROSCOPIC-ADD ON

## 2015-03-13 LAB — PROTIME-INR
INR: 1.04 (ref 0.00–1.49)
PROTHROMBIN TIME: 13.8 s (ref 11.6–15.2)

## 2015-03-13 LAB — LIPASE, BLOOD: LIPASE: 29 U/L (ref 22–51)

## 2015-03-13 LAB — APTT: aPTT: 28 seconds (ref 24–37)

## 2015-03-13 MED ORDER — LORAZEPAM 1 MG PO TABS
1.0000 mg | ORAL_TABLET | Freq: Four times a day (QID) | ORAL | Status: AC | PRN
  Administered 2015-03-15 – 2015-03-16 (×3): 1 mg via ORAL
  Filled 2015-03-13 (×3): qty 1

## 2015-03-13 MED ORDER — FERROUS SULFATE 325 (65 FE) MG PO TABS
325.0000 mg | ORAL_TABLET | Freq: Three times a day (TID) | ORAL | Status: DC
Start: 2015-03-14 — End: 2015-03-18
  Administered 2015-03-14 – 2015-03-18 (×10): 325 mg via ORAL
  Filled 2015-03-13 (×16): qty 1

## 2015-03-13 MED ORDER — METHOCARBAMOL 1000 MG/10ML IJ SOLN
500.0000 mg | Freq: Four times a day (QID) | INTRAVENOUS | Status: DC | PRN
  Filled 2015-03-13: qty 5

## 2015-03-13 MED ORDER — CEFTRIAXONE SODIUM IN DEXTROSE 20 MG/ML IV SOLN
1.0000 g | INTRAVENOUS | Status: DC
  Administered 2015-03-14 – 2015-03-16 (×3): 1 g via INTRAVENOUS
  Filled 2015-03-13 (×5): qty 50

## 2015-03-13 MED ORDER — FOLIC ACID 1 MG PO TABS
1.0000 mg | ORAL_TABLET | Freq: Every day | ORAL | Status: DC
  Administered 2015-03-13 – 2015-03-18 (×5): 1 mg via ORAL
  Filled 2015-03-13 (×6): qty 1

## 2015-03-13 MED ORDER — THIAMINE HCL 100 MG/ML IJ SOLN
Freq: Once | INTRAVENOUS | Status: AC
  Administered 2015-03-13: 18:00:00 via INTRAVENOUS
  Filled 2015-03-13: qty 1000

## 2015-03-13 MED ORDER — MORPHINE SULFATE 2 MG/ML IJ SOLN: 0.5000 mg | INTRAMUSCULAR | Status: DC | PRN

## 2015-03-13 MED ORDER — SODIUM CHLORIDE 0.9 % IV BOLUS (SEPSIS)
1000.0000 mL | Freq: Once | INTRAVENOUS | Status: AC
  Administered 2015-03-13: 1000 mL via INTRAVENOUS

## 2015-03-13 MED ORDER — METHOCARBAMOL 500 MG PO TABS
500.0000 mg | ORAL_TABLET | Freq: Four times a day (QID) | ORAL | Status: DC | PRN
  Administered 2015-03-14: 500 mg via ORAL
  Filled 2015-03-13: qty 1

## 2015-03-13 MED ORDER — SENNOSIDES-DOCUSATE SODIUM 8.6-50 MG PO TABS: 1.0000 | ORAL_TABLET | Freq: Every evening | ORAL | Status: DC | PRN

## 2015-03-13 MED ORDER — DEXTROSE 5 % IV SOLN
1.0000 g | Freq: Once | INTRAVENOUS | Status: AC
  Administered 2015-03-13: 1 g via INTRAVENOUS
  Filled 2015-03-13: qty 10

## 2015-03-13 MED ORDER — VITAMIN B-1 100 MG PO TABS
100.0000 mg | ORAL_TABLET | Freq: Every day | ORAL | Status: DC
  Administered 2015-03-13 – 2015-03-18 (×5): 100 mg via ORAL
  Filled 2015-03-13 (×6): qty 1

## 2015-03-13 MED ORDER — BISACODYL 10 MG RE SUPP: 10.0000 mg | Freq: Every day | RECTAL | Status: DC | PRN

## 2015-03-13 MED ORDER — LORAZEPAM 2 MG/ML IJ SOLN
1.0000 mg | Freq: Four times a day (QID) | INTRAMUSCULAR | Status: AC | PRN
  Administered 2015-03-14 (×2): 1 mg via INTRAVENOUS
  Filled 2015-03-13 (×2): qty 1

## 2015-03-13 MED ORDER — LEVOFLOXACIN IN D5W 500 MG/100ML IV SOLN: 500.0000 mg | INTRAVENOUS | Status: DC

## 2015-03-13 MED ORDER — ADULT MULTIVITAMIN W/MINERALS CH
1.0000 | ORAL_TABLET | Freq: Every day | ORAL | Status: DC
  Administered 2015-03-13 – 2015-03-18 (×5): 1 via ORAL
  Filled 2015-03-13 (×6): qty 1

## 2015-03-13 MED ORDER — HYDROCODONE-ACETAMINOPHEN 5-325 MG PO TABS
1.0000 | ORAL_TABLET | Freq: Four times a day (QID) | ORAL | Status: DC | PRN
  Administered 2015-03-14: 1 via ORAL
  Filled 2015-03-13 (×2): qty 1

## 2015-03-13 MED ORDER — MAGNESIUM CITRATE PO SOLN: 1.0000 | Freq: Once | ORAL | Status: AC | PRN

## 2015-03-13 MED ORDER — THIAMINE HCL 100 MG/ML IJ SOLN
100.0000 mg | Freq: Every day | INTRAMUSCULAR | Status: DC
  Filled 2015-03-13 (×5): qty 1

## 2015-03-13 MED ORDER — SODIUM CHLORIDE 0.9 % IV SOLN
INTRAVENOUS | Status: DC
  Administered 2015-03-14: 01:00:00 via INTRAVENOUS

## 2015-03-13 NOTE — ED Notes (Signed)
Bed: RESB Expected date:  Expected time:  Means of arrival:  Comments: EMS- 69yo F, decreased mobility/ increased incontinence

## 2015-03-13 NOTE — Progress Notes (Signed)
CSW met with pt at bedside. There was no family present.   Patient stated " I just had some problems. I forgot why Im here to be truthful." However Per Note, patient's family reports she has been increasingly weaker over the last month and has experienced incontinence.  Patient states that she lives with her husband and son in [place]. She states that she does not fall often, and that she completes her ADL's independently. Patient states that her husband is her primary support.  CSW asked pt about the possibility of a facility. However, the pt declined.  Willette Brace [phone] ED CSW 03/13/2015 9:02 PM

## 2015-03-13 NOTE — Progress Notes (Signed)
EDCM discussed patient with EDP who reports patient is refusing surgery for femoral neck repair and to discuss home health services with patient's son.  EDCM spoke to patient's son [name] at bedside.  [name] reports patient does not have any equipment at home besides a wheelchair.  Brad reports patient, "Is unable to do anything for herself at home.  And I'm moving out, I'm sorry mom but I'm not going to change my plans."  [facility] spoke to patient and asked why she didn't want to have surgery done?  Patient stated, "I don't know, I'll think about it.  Will it take long? When will it be done?"  [facility] informed patient that it depends on the surgeons schedule when she would have surgery, that she would be in the hospital for a few days and then may go to a rehab.  Patient the stated, "Okay."  [facility] confirmed she was now agreeable to surgery.  EDCM informed EDP who placed consult to orthopedic surgeon.  [facility] discussed patient with EDSW.  Patient's son has not picked a home health agency.  He mentioned he does have a brother who may be able to assist at home.  EDCM discussed [facility] services and [facility].  Patient's son reports, "If she doesn't have this surgery, I'm not going to help her, this is ridiculous, giving up on life."  [facility] offered support to patient and her son.

## 2015-03-13 NOTE — ED Provider Notes (Signed)
CSN: [id number]     Arrival date & time 03/13/15  1442 History   First MD Initiated Contact with Patient 03/13/15 1459     Chief Complaint  Patient presents with  . Fatigue  . Urinary Incontinence     (Consider location/radiation/quality/duration/timing/severity/associated sxs/prior Treatment) The history is provided by the patient and the EMS personnel. No language interpreter was used.   Tara Shaffer is a 69 y.o female with a history of anxiety, depression, hyperlipidemia, htn, diverticulosis, IBS, alcohol abuse, and skin cancer who presents by EMS because her son called and stated that she has been weak for the past month and has experienced both bowel and bladder incontinence.  Patient states that she chooses not to get up to use the bathroom.  She has been laying in her home without getting up for several days now.  Family also reports that she drinks daily.  She admits that she drinks 3-4 glasses of wine daily but has not had any today. She denies lower extremity pain but family also states her lower extremities have been hurting recently.  She denies any fever, chills, dizziness, seizures, tremors, chest pain, shortness of breath, abdominal pain, nausea, vomiting, constipation, or leg swelling.  She denies any fall or head injury.  Past Medical History  Diagnosis Date  . MELANOMA, HX OF 08/04/2007  . ALLERGIC RHINITIS 06/23/2007  . ANXIETY DEPRESSION 06/12/2009  . ANXIETY 06/23/2007  . 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 GERD 06/23/2007  . HYPERLIPIDEMIA 06/24/2007  . HYPERTENSION 06/23/2007  . COLONIC POLYPS, HX OF 03/08/2002  . DIVERTICULOSIS, COLON 08/04/2007  . Irritable bowel syndrome 06/23/2007  . Memory dysfunction 06/23/2011  . Cancer     skin cx/ hx melanoma  . LOW BACK PAIN 06/24/2007  . Alcohol abuse 02/27/2012   Past Surgical History  Procedure Laterality Date  . Cesarean section      x 2  . Colonoscopy    . Appendectomy     Family History  Problem  Relation Age of Onset  . Hypertension Other   . Diabetes Other   . Colon cancer Cousin    History  Substance Use Topics  . Smoking status: Never Smoker   . Smokeless tobacco: Never Used  . Alcohol Use: 25.2 oz/week    42 Glasses of wine per week   OB History    No data available     Review of Systems  Skin: Positive for wound.  Neurological: Positive for weakness. Negative for light-headedness.  All other systems reviewed and are negative.     Allergies  Codeine phosphate  Home Medications   Prior to Admission medications   Not on File   BP 144/82 mmHg  Pulse 103  Temp(Src) 98.1 F (36.7 C) (Oral)  Resp 22  SpO2 94% Physical Exam  Constitutional: She is oriented to person, place, and time. She appears well-developed and well-nourished.  HENT:  Head: Normocephalic and atraumatic.  Eyes: Conjunctivae are normal.  Neck: Normal range of motion. Neck supple.  Cardiovascular: Normal rate.   Pulmonary/Chest: Effort normal.  Abdominal: Soft. She exhibits no distension. There is no tenderness. There is no rebound and no guarding.  Musculoskeletal: Normal range of motion. She exhibits no edema or tenderness.  Unable to flex and extend at the knee. No edema, erythema, or ecchymosis of the bilateral lower extremities.  No tenderness to palpation of the knee, lower leg, ankles, or feet. Pelvis and hips are stable but difficulty to exam due to  patient inability to extend patients legs.  She has been in the flexed knee position since early May and unable to move lower extremities.    Neurological: She is alert and oriented to person, place, and time.  Skin: Skin is warm and dry.  3 Partial thickness loss of skin with exposed dermis on the sacral area. Each measuring approximately 2 cm.  Nursing note and vitals reviewed.   ED Course  Procedures (including critical care time) Labs Review Labs Reviewed  CBC WITH DIFFERENTIAL/PLATELET - Abnormal; Notable for the following:     RBC 3.71 (*)    Hemoglobin 11.3 (*)    HCT 34.1 (*)    Platelets 441 (*)    All other components within normal limits  COMPREHENSIVE METABOLIC PANEL - Abnormal; Notable for the following:    Sodium 134 (*)    Chloride 100 (*)    BUN 21 (*)    Calcium 8.6 (*)    Albumin 2.8 (*)    Alkaline Phosphatase 140 (*)    GFR calc non Af Amer 58 (*)    All other components within normal limits  URINALYSIS, ROUTINE W REFLEX MICROSCOPIC (NOT AT [facility]) - Abnormal; Notable for the following:    Color, Urine AMBER (*)    APPearance TURBID (*)    Hgb urine dipstick MODERATE (*)    Bilirubin Urine SMALL (*)    Protein, ur 30 (*)    Leukocytes, UA LARGE (*)    All other components within normal limits  URINE MICROSCOPIC-ADD ON - Abnormal; Notable for the following:    Bacteria, UA MANY (*)    All other components within normal limits  LIPASE, BLOOD    Imaging Review Dg Chest 1 View  03/13/2015   CLINICAL DATA:  Multiple falls with increasing weakness and incontinence. Hip fracture. Initial encounter.  EXAM: CHEST  1 VIEW  COMPARISON:  04/11/2012 radiographs.  FINDINGS: 1744 hour. The heart size and mediastinal contours are stable. There are calcified right hilar and right pulmonary granulomas. The lungs are otherwise clear. There is no pleural effusion or pneumothorax. Old left-sided rib fractures noted. No acute osseous findings evident.  IMPRESSION: No acute chest findings or evidence of active cardiopulmonary process. Prior granulomatous disease and old left-sided rib fractures noted.   Electronically Signed   By: Richardean Sale M.D.   On: 03/13/2015 17:57   Dg Pelvis 1-2 Views  03/13/2015   CLINICAL DATA:  Increasing weakness over past month, incontinence, falls, ethanol abuse, fatigue, past history melanoma, hypertension  EXAM: PELVIS - 1-2 VIEW  COMPARISON:  None  FINDINGS: Severely rotated to the RIGHT.  Diffuse osseous demineralization.  Displaced RIGHT femoral neck fracture with overriding.   No dislocation.  LEFT femur appears foreshortened due to obliquity without gross fracture.  Visualize bony pelvis intact.  IMPRESSION: Displaced overriding RIGHT femoral neck fracture.   Electronically Signed   By: Lavonia Dana M.D.   On: 03/13/2015 17:00   Dg Knee 1-2 Views Left  03/13/2015   CLINICAL DATA:  Intermittent pain.  Chronic flexion deformity  EXAM: LEFT KNEE - 1-2 VIEW  COMPARISON:  None.  FINDINGS: Frontal and lateral views obtained. The knee joint is flexed with patient reportedly unable to straighten knee. No frank dislocation. No fracture or joint effusion. There is mild generalized joint space narrowing.  IMPRESSION: No fracture or effusion. Knee flexed with patient unable to extend. No dislocations seen. Mild generalized joint space narrowing without erosion or bony destruction apparent.  Electronically Signed   By: Lowella Grip III M.D.   On: 03/13/2015 16:55   Dg Knee 1-2 Views Right  03/13/2015   CLINICAL DATA:  Right knee pain  EXAM: RIGHT KNEE - 1-2 VIEW  COMPARISON:  None.  FINDINGS: Limited evaluation secondary to flex knee in the AP view. The patient is unable to extend. No acute fracture or dislocation. No significant joint effusion. Normal soft tissues. Mild tricompartmental osteoarthritis.  IMPRESSION: No acute osseous injury of the right knee, but evaluation is limited secondary to a flexed knee in the AP view.   Electronically Signed   By: Kathreen Devoid   On: 03/13/2015 17:00   Dg Hip Unilat With Pelvis 2-3 Views Right  03/13/2015   CLINICAL DATA:  Hip pain. History of recent falls. Initial encounter.  EXAM: RIGHT HIP (WITH PELVIS) 2-3 VIEWS  COMPARISON:  None.  FINDINGS: The bones are demineralized. There is a superiorly displaced acute fracture of the right femoral neck. The femoral head appears located. No evidence of pelvic fracture.  IMPRESSION: Superiorly displaced acute fracture of the right femoral neck. No associated dislocation.   Electronically Signed   By:  Richardean Sale M.D.   On: 03/13/2015 17:55     EKG Interpretation None      MDM   Final diagnoses:  Unable to ambulate  Weakness  Closed right hip fracture, initial encounter  Patient presents from home as requested by family members for weakness and urinary/bowel incontinence that has progressively worsened in the past couple of weeks. Patient is covered in fecal matter and urine. She has some partial-thickness loss of skin with exposed dermis on the sacral area. Patient fell in early May and has not been able to walk since. She has not been evaluated since the fall. Patient states she is in no pain. She appears comfortable. No tremors.  Upon recheck, son is now at bedside.  He states his mother has dementia most likely from alcoholism. Her husband was the primary care giver and had a stroke Monday.  Since then, he has been caring for her but realized that she was worse off than he thought. She normally drinks 1 bottle of wine daily. He gave her a bottle of wine yesterday. He states she was shaking today so he gave her half a glass of wine and diluted it with water at 11 AM. I gave the patient a banana bag. Patient spoke to case management and agrees with surgery and admission.  I spoke to Dr. Tawnya Crook who has seen the patient. Patient has UTI and was treated with IV ceftriaxone. I reviewed CIWA criteria. Medications  cefTRIAXone (ROCEPHIN) 1 g in dextrose 5 % 50 mL IVPB (1 g Intravenous New Bag/Given 03/13/15 1912)  sodium chloride 0.9 % bolus 1,000 mL (0 mLs Intravenous Stopped 03/13/15 1814)  sodium chloride 0.9 % 1,000 mL with thiamine 010 mg, folic acid 1 mg, multivitamins adult 10 mL infusion ( Intravenous New Bag/Given 03/13/15 1803)   I spoke to Dr. Alvan Dame, from ortho and patient will need admission. He will try to schedule surgery for tomorrow.   I spoke to Dr. Humphrey Rolls who will see the patient and admit.   Ottie Glazier, PA-C 03/14/15 0712  Ernestina Patches, MD 03/14/15 2210

## 2015-03-13 NOTE — ED Notes (Signed)
Patient transported to X-ray 

## 2015-03-13 NOTE — Progress Notes (Signed)
CM consulted by Jearld Lesch, ED PA/NP Cm reviewed in details medicare guidelines, home health [facility]) (length of stay in home, types of [facility] staff available, coverage, primary caregiver, up to 24 hrs before services may be started) and Private duty nursing (PDN-coverage, length of stay in the home types of staff available). CM reviewed availability of [facility] SW to assist pcp to get pt to snf (if desired disposition) from the community level. CM provided pt/son-Brad with a list of [place] home health agencies and PDN.  When Cm initially went to speak with pt, Brad informed Cm after a minute of speaking with pt that she would not understand Cm because of dementia When Cm inquired who dx dementia he stated no one and her providers have said she has "memory issues"  Pt noted to pay attention to Cm as she spoke with [facility] about care options.  [name] states he, pt and his father lives in the same home but recently the father had a stroke on "tuesday", went to [facility] and now is at [facility].  [name] states he was advised by "nicole" at [facility] to bring pt to ED. [name] reports his father was pt's primary care giver and [name] reports not being able to take care of pt at home.  Reports since his father was at [facility] he has been having difficulty caring for pt at home. CM discussed with Brad options 1) pt meet a medicare (traditional) guideline for inpatient admission and can get assist with placement if pt willing to go to facility  2) pt does not meet medicare (traditional) guideline for inpatient admission and can be assisted with home health services (RN, PT, OT, aide, SW) to work with pcp to get community placement from home and availability of PDN.  [name] stated they would not be able to afford PDN [name] stated his father had considered but had not been looking for placement for pt prior to his stroke When CM re addressed pt prior to leaving the room she informed CM she "want to go home" CM discussed with Brad  competency guidelines- If pt found competent, POA status is not activated and pt is found competent to make her own choices [name] stated "She will never pass competency."  CM noted pt's pcp is associated with [facility] A referral can be offered  Reviewed with [facility] ED PM CM

## 2015-03-13 NOTE — ED Notes (Addendum)
Patient transported to x-ray. ?

## 2015-03-13 NOTE — ED Notes (Signed)
Patients family reports she has been increasingly weaker over the last month and has experienced incontinence. Family reports patient has fallen but it is uncertain whether it is r/t ble weakness or r/t alcohol abuse. Family member reports pt has had one glass of wine today which is less than normal intake. Pt currently denies pain in BLE although family reports.

## 2015-03-13 NOTE — ED Notes (Signed)
Attempted to call report, RN not available. Will f/u

## 2015-03-13 NOTE — Progress Notes (Signed)
ANTIBIOTIC CONSULT NOTE - INITIAL  Pharmacy Consult for Ceftriaxone  Indication: UTI  Allergies  Allergen Reactions  . Codeine Phosphate Nausea Only    Patient Measurements:     Vital Signs: Temp: 98.1 F (36.7 C) (06/16 1800) Temp Source: Oral (06/16 1800) BP: 142/75 mmHg (06/16 2000) Pulse Rate: 102 (06/16 2000) Intake/Output from previous day:   Intake/Output from this shift:    Labs:  Recent Labs  03/13/15 1544  WBC 9.8  HGB 11.3*  PLT 441*  CREATININE 0.99   CrCl cannot be calculated (Unknown ideal weight.). No results for input(s): VANCOTROUGH, VANCOPEAK, VANCORANDOM, GENTTROUGH, GENTPEAK, GENTRANDOM, TOBRATROUGH, TOBRAPEAK, TOBRARND, AMIKACINPEAK, AMIKACINTROU, AMIKACIN in the last 72 hours.   Microbiology: No results found for this or any previous visit (from the past 720 hour(s)).  Medical History: Past Medical History  Diagnosis Date  . MELANOMA, HX OF 08/04/2007  . ALLERGIC RHINITIS 06/23/2007  . ANXIETY DEPRESSION 06/12/2009  . ANXIETY 06/23/2007  . 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 GERD 06/23/2007  . HYPERLIPIDEMIA 06/24/2007  . HYPERTENSION 06/23/2007  . COLONIC POLYPS, HX OF 03/08/2002  . DIVERTICULOSIS, COLON 08/04/2007  . Irritable bowel syndrome 06/23/2007  . Memory dysfunction 06/23/2011  . Cancer     skin cx/ hx melanoma  . LOW BACK PAIN 06/24/2007  . Alcohol abuse 02/27/2012     Assessment: 38 yoF admitted with right hip fracture, UTI.  Pharmacy consulted to dose Ceftriaxone for UTI.  Urine culture sent.  WBC WNL.  Patient afebrile.  Ceftriaxone 1g already given in ED.  Goal of Therapy:  Eradication of infection  Plan:  Ceftriaxone 1g IV q24h.  No renal adjustments necessary.  Pharmacy will sign off.  Hershal Coria 03/13/2015,10:01 PM

## 2015-03-13 NOTE — H&P (Signed)
[facility] History and Physical  Tara Shaffer ZTI:[id number] DOB: 06/03/1946 DOA: 03/13/2015  Referring physician: Hebert Soho PA PCP: [name], MD   Chief Complaint: Hip Pain  HPI: Tara Shaffer is a 69 y.o. female with history of ETOH abuse HTN hyperlipidemia presents with hip pain. Patient is confused and is not able to provide a history. Apparently she thinks her husband brought her in to the hospital however she was found by her son at home laying on her couch. Patient has been immobile for the last month or so. She has been drinking daily also almost a full bottle of wine. Last drink was this morning. Patient states tha she may have taken a fall in the past couple of weeks. She does have some pain in her right hip area according to the famiily notes. She herself states that she has not had pain. In the ED she had a Hip X ray done and this shows fracture of the right femoral neck fracture. ED called orthopedics and they will repair her in the morning.   Review of Systems:  Patient is not able to provide a ROS as she is confused  Past Medical History  Diagnosis Date  . MELANOMA, HX OF 08/04/2007  . ALLERGIC RHINITIS 06/23/2007  . ANXIETY DEPRESSION 06/12/2009  . ANXIETY 06/23/2007  . 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 GERD 06/23/2007  . HYPERLIPIDEMIA 06/24/2007  . HYPERTENSION 06/23/2007  . COLONIC POLYPS, HX OF 03/08/2002  . DIVERTICULOSIS, COLON 08/04/2007  . Irritable bowel syndrome 06/23/2007  . Memory dysfunction 06/23/2011  . Cancer     skin cx/ hx melanoma  . LOW BACK PAIN 06/24/2007  . Alcohol abuse 02/27/2012   Past Surgical History  Procedure Laterality Date  . Cesarean section      x 2  . Colonoscopy    . Appendectomy     Social History:  reports that she has never smoked. She has never used smokeless tobacco. She reports that she drinks about 25.2 oz of alcohol per week. She reports that she does not use illicit  drugs.  Allergies  Allergen Reactions  . Codeine Phosphate Nausea Only    Family History  Problem Relation Age of Onset  . Hypertension Other   . Diabetes Other   . Colon cancer Cousin      Prior to Admission medications   Not on File   Physical Exam: Filed Vitals:   03/13/15 1458 03/13/15 1521 03/13/15 1600 03/13/15 1800  BP:  135/84 148/83 144/82  Pulse:  102 106 103  Temp:  97.5 F (36.4 C)  98.1 F (36.7 C)  TempSrc:  Oral  Oral  Resp:  21  22  SpO2: 98% 100% 99% 94%    Wt Readings from Last 3 Encounters:  10/31/14 57.153 kg (126 lb)  07/31/14 55.963 kg (123 lb 6 oz)  07/18/14 56.7 kg (125 lb)    General:  Appears calm and comfortable and confused Eyes: PERRL, normal lids, irises & conjunctiva ENT: grossly normal hearing Neck: no LAD, masses or thyromegaly Cardiovascular: RRR, no m/r/g. No LE edema. Respiratory: CTA bilaterally, no w/r/r. Normal respiratory effort. Abdomen: soft, ntnd Skin: bruising and abrasions noted on LE and UE Musculoskeletal: limited exam she has multiple areas of bruising noted unable to assess strength and tone Psychiatric: confused disoriented Neurologic: unable to assess          Labs on Admission:  Basic Metabolic Panel:  Recent Labs Lab 03/13/15 1544  NA 134*  K 3.5  CL 100*  CO2 25  GLUCOSE 95  BUN 21*  CREATININE 0.99  CALCIUM 8.6*   Liver Function Tests:  Recent Labs Lab 03/13/15 1544  AST 24  ALT 15  ALKPHOS 140*  BILITOT 0.8  PROT 7.2  ALBUMIN 2.8*    Recent Labs Lab 03/13/15 1544  LIPASE 29   No results for input(s): AMMONIA in the last 168 hours. CBC:  Recent Labs Lab 03/13/15 1544  WBC 9.8  NEUTROABS 7.4  HGB 11.3*  HCT 34.1*  MCV 91.9  PLT 441*   Cardiac Enzymes: No results for input(s): CKTOTAL, CKMB, CKMBINDEX, TROPONINI in the last 168 hours.  BNP (last 3 results) No results for input(s): BNP in the last 8760 hours.  ProBNP (last 3 results) No results for input(s):  PROBNP in the last 8760 hours.  CBG: No results for input(s): GLUCAP in the last 168 hours.  Radiological Exams on Admission: Dg Chest 1 View  03/13/2015   CLINICAL DATA:  Multiple falls with increasing weakness and incontinence. Hip fracture. Initial encounter.  EXAM: CHEST  1 VIEW  COMPARISON:  04/11/2012 radiographs.  FINDINGS: 1744 hour. The heart size and mediastinal contours are stable. There are calcified right hilar and right pulmonary granulomas. The lungs are otherwise clear. There is no pleural effusion or pneumothorax. Old left-sided rib fractures noted. No acute osseous findings evident.  IMPRESSION: No acute chest findings or evidence of active cardiopulmonary process. Prior granulomatous disease and old left-sided rib fractures noted.   Electronically Signed   By: Richardean Sale M.D.   On: 03/13/2015 17:57   Dg Pelvis 1-2 Views  03/13/2015   CLINICAL DATA:  Increasing weakness over past month, incontinence, falls, ethanol abuse, fatigue, past history melanoma, hypertension  EXAM: PELVIS - 1-2 VIEW  COMPARISON:  None  FINDINGS: Severely rotated to the RIGHT.  Diffuse osseous demineralization.  Displaced RIGHT femoral neck fracture with overriding.  No dislocation.  LEFT femur appears foreshortened due to obliquity without gross fracture.  Visualize bony pelvis intact.  IMPRESSION: Displaced overriding RIGHT femoral neck fracture.   Electronically Signed   By: Lavonia Dana M.D.   On: 03/13/2015 17:00   Dg Knee 1-2 Views Left  03/13/2015   CLINICAL DATA:  Intermittent pain.  Chronic flexion deformity  EXAM: LEFT KNEE - 1-2 VIEW  COMPARISON:  None.  FINDINGS: Frontal and lateral views obtained. The knee joint is flexed with patient reportedly unable to straighten knee. No frank dislocation. No fracture or joint effusion. There is mild generalized joint space narrowing.  IMPRESSION: No fracture or effusion. Knee flexed with patient unable to extend. No dislocations seen. Mild generalized joint  space narrowing without erosion or bony destruction apparent.   Electronically Signed   By: Lowella Grip III M.D.   On: 03/13/2015 16:55   Dg Knee 1-2 Views Right  03/13/2015   CLINICAL DATA:  Right knee pain  EXAM: RIGHT KNEE - 1-2 VIEW  COMPARISON:  None.  FINDINGS: Limited evaluation secondary to flex knee in the AP view. The patient is unable to extend. No acute fracture or dislocation. No significant joint effusion. Normal soft tissues. Mild tricompartmental osteoarthritis.  IMPRESSION: No acute osseous injury of the right knee, but evaluation is limited secondary to a flexed knee in the AP view.   Electronically Signed   By: Kathreen Devoid   On: 03/13/2015 17:00   Dg Hip Unilat With Pelvis 2-3 Views Right  03/13/2015  CLINICAL DATA:  Hip pain. History of recent falls. Initial encounter.  EXAM: RIGHT HIP (WITH PELVIS) 2-3 VIEWS  COMPARISON:  None.  FINDINGS: The bones are demineralized. There is a superiorly displaced acute fracture of the right femoral neck. The femoral head appears located. No evidence of pelvic fracture.  IMPRESSION: Superiorly displaced acute fracture of the right femoral neck. No associated dislocation.   Electronically Signed   By: Richardean Sale M.D.   On: 03/13/2015 17:55      Assessment/Plan Active Problems:   Essential hypertension   Alcohol abuse   Closed right hip fracture   UTI (lower urinary tract infection)   Hyperlipidemia   1. Right hip Fracture -she may have fallen some time ago.  -orthopedics was contacted by the ED and will repair the hip in am -will admit for pain control CXR ECG ordered  2. ETOH abuse -last drink was reportedly at 11AM not sure how reliable this is -will place on DT precautions  3. Hypertension Essential -states that she does not take any medications -will monitor pressures  4. UTI -will start on rocephin -will follow up cultures  5. Hyperlipidemia -check lipid panel  6. Multiple Skin Abrasions Possible Pressure  ulcer -will need full skin evaluation  -likely related to falls and immobility -CSW consult ordered   Code Status: Full Code (must indicate code status--if unknown or must be presumed, indicate so) DVT Prophylaxis:SCD Family Communication: None (indicate person spoken with, if applicable, with phone number if by telephone) Disposition Plan: SNF (indicate anticipated LOS)  Time spent: 38min  Jeraldean Wechter A [facility] Pager [phone]

## 2015-03-13 NOTE — ED Notes (Signed)
Patient was undressed for head to toe assessment. Pt skin over all thin and dry. Several areas on back and buttock area scratched. Stage ulcer to sacral area, outer and inner buttocks are red. It appears patient maybe incontinent bladder and bowel.

## 2015-03-14 DIAGNOSIS — I10 Essential (primary) hypertension: Secondary | ICD-10-CM

## 2015-03-14 DIAGNOSIS — L8990 Pressure ulcer of unspecified site, unspecified stage: Secondary | ICD-10-CM | POA: Insufficient documentation

## 2015-03-14 DIAGNOSIS — E440 Moderate protein-calorie malnutrition: Secondary | ICD-10-CM | POA: Insufficient documentation

## 2015-03-14 DIAGNOSIS — F1010 Alcohol abuse, uncomplicated: Secondary | ICD-10-CM

## 2015-03-14 DIAGNOSIS — N390 Urinary tract infection, site not specified: Secondary | ICD-10-CM

## 2015-03-14 LAB — TYPE AND SCREEN
ABO/RH(D): B POS
ANTIBODY SCREEN: NEGATIVE

## 2015-03-14 LAB — ABO/RH: ABO/RH(D): B POS

## 2015-03-14 MED ORDER — ENSURE ENLIVE PO LIQD
237.0000 mL | Freq: Two times a day (BID) | ORAL | Status: DC
  Administered 2015-03-14 – 2015-03-18 (×5): 237 mL via ORAL

## 2015-03-14 MED ORDER — PRO-STAT 64 PO LIQD
30.0000 mL | Freq: Two times a day (BID) | ORAL | Status: DC
  Administered 2015-03-14 – 2015-03-18 (×7): 30 mL via ORAL
  Filled 2015-03-14 (×9): qty 30

## 2015-03-14 MED ORDER — ENOXAPARIN SODIUM 40 MG/0.4ML ~~LOC~~ SOLN
40.0000 mg | SUBCUTANEOUS | Status: DC
  Administered 2015-03-14 – 2015-03-17 (×4): 40 mg via SUBCUTANEOUS
  Filled 2015-03-14 (×5): qty 0.4

## 2015-03-14 NOTE — Consult Note (Signed)
Reason for Consult:     Right femoral neck fracture Referring Physician:    ED Physician  Tara Shaffer is an 69 y.o. female.  HPI: Tara Shaffer is a 69 y.o. female with history of ETOH abuse HTN hyperlipidemia presents with hip pain. Patient is confused and is not able to provide a history. History obtained from ER note. Apparently she thinks her husband brought her in to the hospital however she was found by her son at home laying on her couch. Patient has been immobile for the last month or so. She has been drinking daily also almost a full bottle of wine. Last drink was this morning. Patient states that she may have taken a fall in the past couple of weeks. She does have some pain in her right hip area according to the family notes. She herself states that she has not had pain. In the ED she had a Hip X ray done and this shows fracture of the right femoral neck fracture. ED called Dr. Alvan Dame for a orthopaedic consult.   Tried having a discussion with patient, as in the ER, she is a poor historian and unable to get any good information from her.    Past Medical History  Diagnosis Date  . MELANOMA, HX OF 08/04/2007  . ALLERGIC RHINITIS 06/23/2007  . ANXIETY DEPRESSION 06/12/2009  . ANXIETY 06/23/2007  . 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 GERD 06/23/2007  . HYPERLIPIDEMIA 06/24/2007  . HYPERTENSION 06/23/2007  . COLONIC POLYPS, HX OF 03/08/2002  . DIVERTICULOSIS, COLON 08/04/2007  . Irritable bowel syndrome 06/23/2007  . Memory dysfunction 06/23/2011  . Cancer     skin cx/ hx melanoma  . LOW BACK PAIN 06/24/2007  . Alcohol abuse 02/27/2012    Past Surgical History  Procedure Laterality Date  . Cesarean section      x 2  . Colonoscopy    . Appendectomy      Family History  Problem Relation Age of Onset  . Hypertension Other   . Diabetes Other   . Colon cancer Cousin     Social History:  reports that she has never smoked. She has never used smokeless tobacco.  She reports that she drinks about 25.2 oz of alcohol per week. She reports that she does not use illicit drugs.  Allergies:  Allergies  Allergen Reactions  . Codeine Phosphate Nausea Only     Results for orders placed or performed during the hospital encounter of 03/13/15 (from the past 48 hour(s))  CBC with Differential     Status: Abnormal   Collection Time: 03/13/15  3:44 PM  Result Value Ref Range   WBC 9.8 4.0 - 10.5 K/uL   RBC 3.71 (L) 3.87 - 5.11 MIL/uL   Hemoglobin 11.3 (L) 12.0 - 15.0 g/dL   HCT 34.1 (L) 36.0 - 46.0 %   MCV 91.9 78.0 - 100.0 fL   MCH 30.5 26.0 - 34.0 pg   MCHC 33.1 30.0 - 36.0 g/dL   RDW 12.8 11.5 - 15.5 %   Platelets 441 (H) 150 - 400 K/uL   Neutrophils Relative % 76 43 - 77 %   Neutro Abs 7.4 1.7 - 7.7 K/uL   Lymphocytes Relative 13 12 - 46 %   Lymphs Abs 1.3 0.7 - 4.0 K/uL   Monocytes Relative 9 3 - 12 %   Monocytes Absolute 0.9 0.1 - 1.0 K/uL   Eosinophils Relative 2 0 - 5 %   Eosinophils  Absolute 0.2 0.0 - 0.7 K/uL   Basophils Relative 0 0 - 1 %   Basophils Absolute 0.0 0.0 - 0.1 K/uL  Comprehensive metabolic panel     Status: Abnormal   Collection Time: 03/13/15  3:44 PM  Result Value Ref Range   Sodium 134 (L) 135 - 145 mmol/L   Potassium 3.5 3.5 - 5.1 mmol/L   Chloride 100 (L) 101 - 111 mmol/L   CO2 25 22 - 32 mmol/L   Glucose, Bld 95 65 - 99 mg/dL   BUN 21 (H) 6 - 20 mg/dL   Creatinine, Ser 0.99 0.44 - 1.00 mg/dL   Calcium 8.6 (L) 8.9 - 10.3 mg/dL   Total Protein 7.2 6.5 - 8.1 g/dL   Albumin 2.8 (L) 3.5 - 5.0 g/dL   AST 24 15 - 41 U/L   ALT 15 14 - 54 U/L   Alkaline Phosphatase 140 (H) 38 - 126 U/L   Total Bilirubin 0.8 0.3 - 1.2 mg/dL   GFR calc non Af Amer 58 (L) >60 mL/min   GFR calc Af Amer >60 >60 mL/min    Comment: (NOTE) The eGFR has been calculated using the CKD EPI equation. This calculation has not been validated in all clinical situations. eGFR's persistently <60 mL/min signify possible Chronic Kidney Disease.     Anion gap 9 5 - 15  Lipase, blood     Status: None   Collection Time: 03/13/15  3:44 PM  Result Value Ref Range   Lipase 29 22 - 51 U/L  Urinalysis, Routine w reflex microscopic (not at [facility])     Status: Abnormal   Collection Time: 03/13/15  5:47 PM  Result Value Ref Range   Color, Urine AMBER (A) YELLOW    Comment: BIOCHEMICALS MAY BE AFFECTED BY COLOR   APPearance TURBID (A) CLEAR   Specific Gravity, Urine 1.017 1.005 - 1.030   pH 7.0 5.0 - 8.0   Glucose, UA NEGATIVE NEGATIVE mg/dL   Hgb urine dipstick MODERATE (A) NEGATIVE   Bilirubin Urine SMALL (A) NEGATIVE   Ketones, ur NEGATIVE NEGATIVE mg/dL   Protein, ur 30 (A) NEGATIVE mg/dL   Urobilinogen, UA 1.0 0.0 - 1.0 mg/dL   Nitrite NEGATIVE NEGATIVE   Leukocytes, UA LARGE (A) NEGATIVE  Urine microscopic-add on     Status: Abnormal   Collection Time: 03/13/15  5:47 PM  Result Value Ref Range   WBC, UA TOO NUMEROUS TO COUNT <3 WBC/hpf   Bacteria, UA MANY (A) RARE   Urine-Other FIELD OBSCURED BY WBC'S   ABO/Rh     Status: None   Collection Time: 03/13/15 10:30 PM  Result Value Ref Range   ABO/RH(D) B POS   Protime-INR     Status: None   Collection Time: 03/13/15 10:33 PM  Result Value Ref Range   Prothrombin Time 13.8 11.6 - 15.2 seconds   INR 1.04 0.00 - 1.49  APTT     Status: None   Collection Time: 03/13/15 10:33 PM  Result Value Ref Range   aPTT 28 24 - 37 seconds  Type and screen     Status: None   Collection Time: 03/13/15 10:33 PM  Result Value Ref Range   ABO/RH(D) B POS    Antibody Screen NEG    Sample Expiration 03/16/2015     Dg Chest 1 View  03/13/2015   CLINICAL DATA:  Multiple falls with increasing weakness and incontinence. Hip fracture. Initial encounter.  EXAM: CHEST  1 VIEW  COMPARISON:  04/11/2012 radiographs.  FINDINGS: 1744 hour. The heart size and mediastinal contours are stable. There are calcified right hilar and right pulmonary granulomas. The lungs are otherwise clear. There is no pleural  effusion or pneumothorax. Old left-sided rib fractures noted. No acute osseous findings evident.  IMPRESSION: No acute chest findings or evidence of active cardiopulmonary process. Prior granulomatous disease and old left-sided rib fractures noted.   Electronically Signed   By: Richardean Sale M.D.   On: 03/13/2015 17:57   Dg Pelvis 1-2 Views  03/13/2015   CLINICAL DATA:  Increasing weakness over past month, incontinence, falls, ethanol abuse, fatigue, past history melanoma, hypertension  EXAM: PELVIS - 1-2 VIEW  COMPARISON:  None  FINDINGS: Severely rotated to the RIGHT.  Diffuse osseous demineralization.  Displaced RIGHT femoral neck fracture with overriding.  No dislocation.  LEFT femur appears foreshortened due to obliquity without gross fracture.  Visualize bony pelvis intact.  IMPRESSION: Displaced overriding RIGHT femoral neck fracture.   Electronically Signed   By: Lavonia Dana M.D.   On: 03/13/2015 17:00   Dg Knee 1-2 Views Left  03/13/2015   CLINICAL DATA:  Intermittent pain.  Chronic flexion deformity  EXAM: LEFT KNEE - 1-2 VIEW  COMPARISON:  None.  FINDINGS: Frontal and lateral views obtained. The knee joint is flexed with patient reportedly unable to straighten knee. No frank dislocation. No fracture or joint effusion. There is mild generalized joint space narrowing.  IMPRESSION: No fracture or effusion. Knee flexed with patient unable to extend. No dislocations seen. Mild generalized joint space narrowing without erosion or bony destruction apparent.   Electronically Signed   By: Lowella Grip III M.D.   On: 03/13/2015 16:55   Dg Knee 1-2 Views Right  03/13/2015   CLINICAL DATA:  Right knee pain  EXAM: RIGHT KNEE - 1-2 VIEW  COMPARISON:  None.  FINDINGS: Limited evaluation secondary to flex knee in the AP view. The patient is unable to extend. No acute fracture or dislocation. No significant joint effusion. Normal soft tissues. Mild tricompartmental osteoarthritis.  IMPRESSION: No acute  osseous injury of the right knee, but evaluation is limited secondary to a flexed knee in the AP view.   Electronically Signed   By: Kathreen Devoid   On: 03/13/2015 17:00   Dg Hip Unilat With Pelvis 2-3 Views Right  03/13/2015   CLINICAL DATA:  Hip pain. History of recent falls. Initial encounter.  EXAM: RIGHT HIP (WITH PELVIS) 2-3 VIEWS  COMPARISON:  None.  FINDINGS: The bones are demineralized. There is a superiorly displaced acute fracture of the right femoral neck. The femoral head appears located. No evidence of pelvic fracture.  IMPRESSION: Superiorly displaced acute fracture of the right femoral neck. No associated dislocation.   Electronically Signed   By: Richardean Sale M.D.   On: 03/13/2015 17:55    Review of Systems  Constitutional: Negative.   HENT: Negative.   Eyes: Negative.   Respiratory: Negative.   Cardiovascular: Negative.   Gastrointestinal: Positive for heartburn.  Genitourinary: Negative.   Musculoskeletal: Positive for joint pain.  Skin: Negative.   Endo/Heme/Allergies: Positive for environmental allergies.  Psychiatric/Behavioral: Positive for depression and memory loss. The patient is nervous/anxious.    Blood pressure 96/76, pulse 89, temperature 98.9 F (37.2 C), temperature source Oral, resp. rate 14, SpO2 98 %. Physical Exam  Constitutional: She appears well-developed.  HENT:  Head: Normocephalic.  Eyes: Pupils are equal, round, and reactive to light.  Neck: Neck supple. No JVD present.  No tracheal deviation present. No thyromegaly present.  Cardiovascular: Normal rate and intact distal pulses.   Respiratory: Effort normal. No respiratory distress.  GI: Soft. There is no tenderness. There is no guarding.  Musculoskeletal:       Right hip: She exhibits decreased range of motion (patient is in the fetal position and will barely move either leg from this position while trying to do an exam) and tenderness. She exhibits no deformity and no laceration.   Neurological: She is alert.  Skin: Skin is warm and dry.    Assessment/Plan: Non-acute right femoral neck fracture   This is definitely not an acute fracture She is contracted in the bed and unable to straighten her legs, she has probably not walked for quite some time.  She has a decubitus from already not moving and she lays directly on the right hip, which would be on the surgical incision. Dr. Lyla Glassing saw the patient and for these reasons does not feel that she is a good candidate for surgery.    Pricilla Loveless 03/14/2015, 9:10 AM

## 2015-03-14 NOTE — Clinical Social Work Note (Signed)
Clinical Social Work Assessment  Patient Details  Name: Tara Shaffer MRN: [id number] Date of Birth: 1945-10-21  Date of referral:  03/14/15               Reason for consult:  Facility Placement, Discharge Planning                Permission sought to share information with:    Permission granted to share information::     Name::        Agency::     Relationship::     Contact Information:     Housing/Transportation Living arrangements for the past 2 months:  Single Family Home Source of Information:  Adult Children Patient Interpreter Needed:  None Criminal Activity/Legal Involvement Pertinent to Current Situation/Hospitalization:  No - Comment as needed Significant Relationships:  Adult Children Lives with:  Spouse Do you feel safe going back to the place where you live?    Need for family participation in patient care:  Yes (Comment) (LTC placement needed.)  Care giving concerns:  Pt's family is unable to provide needed care at home.   Social Worker assessment / plan: Pt hospitalized on 03/13/15 right hip fx. Ortho consulting. ED CSW met with pt on 6/16. CSW met with pt's 2 sons on 6/17 to assist with d/c planning. Son Delfino Lovett ) reports that he lives at home with parents and works full time. His dad was caring for his mother up until last Monday when he had a stroke. He was hospitalized then placed at [facility] for rehab. Son reports that pt drinks wine daily. Son reports that pt hasn't been able to walk since she fell a couple of months ago. Son reports that he was trying to provide care but pt would call out in pain. Son reports that he wasn't aware that pt was in such poor condition until his dad was hospitalized. Both sons report that they have tried to care for pt at home but are unable to manage pt's care. They are requesting placement at [facility], if possible. Sons will talk to pt about placement.  Employment status:  Retired Forensic scientist:  Medicare PT  Recommendations:  Not assessed at this time [place] / Referral to community resources:  [place]  Patient/Family's Response to care:  Family reports SNF is needed.  Patient/Family's Understanding of and Emotional Response to Diagnosis, Current Treatment, and Prognosis:  Ortho has been consulted. Unclear if surgery will be recommended. Family is overwhelmed with pt's care needs at home and feel placement is needed. They feel pt will be agreeable with this plan. Emotional Assessment Appearance:  Appears older than stated age Attitude/Demeanor/Rapport:  Other (cooperative) Affect (typically observed):  Appropriate, Calm Orientation:  Oriented to Self Alcohol / Substance use:  Alcohol Use Psych involvement (Current and /or in the community):  No (Comment)  Discharge Needs  Concerns to be addressed:  Discharge Planning Concerns Readmission within the last 30 days:  No Current discharge risk:  None Barriers to Discharge:  No Barriers Identified   Natalija Mavis, Randall An, LCSW 03/14/2015, 12:17 PM

## 2015-03-14 NOTE — Care Management Note (Signed)
Case Management Note  Patient Details  Name: Tara Shaffer MRN: [id number] Date of Birth: [birth year]  Subjective/Objective:            etoh withdrawal        Action/Plan:   tbd   Expected Discharge Date:   (unknown)       [id number]        Expected Discharge Plan:     In-House Referral:  Clinical Social Work  Discharge planning Services  CM Consult  Post Acute Care Choice:    Choice offered to:     DME Arranged:    DME Agency:     HH Arranged:    [place] Agency:     Status of Service:  In process, will continue to follow  Medicare Important Message Given:    Date Medicare IM Given:    Medicare IM give by:    Date Additional Medicare IM Given:    Additional Medicare Important Message give by:     If discussed at [place] of Stay Meetings, dates discussed:    Additional Comments:  Leeroy Cha, RN 03/14/2015, 10:28 AM

## 2015-03-14 NOTE — Consult Note (Signed)
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  Bilateral heels are cracked and erythematous. 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), pressure, trauma (scratching) Pressure Ulcer POA: Yes Measurement:Sacrum PrI, Stage 3 measures 4cm x 3cm x 0.2cm with linear breaks in skin the the buttock area due to patient scratching, serous drainage; partial thickness tissue loss ar right LE measuring 3cm x 1.5cm x 0.2cm (serosanguinous drainage), skin tear on RUE measures 2cm x 1cm x 0.2cm.(serous drainage) Wound bed:As described above Drainage (amount, consistency, odor) As described above Periwound: extremely dry and vulnerable skin with erythema and linear breaks in integrity due to scratching Dressing procedure/placement/frequency: I recommend continuance of the indwelling urinary catheter at the moment due to Stage 3 pressure injury and vulnerable condition of skin. If you agree, please order. Nursing staff to monitor and remove catheter at the earliest opportunity. Bilateral pressure redistribution heel boots and a therapeutic mattress replacement with low air loss feature are provided.  Topical wound care orders for the skin tear on the right arm and ulcer on the RLE as well as the pressure injury on the sacrum (stage 3) are provided. [place] nursing team will not follow, but will remain available to this patient, the nursing and medical team.  Please re-consult if needed. Thanks, Maudie Flakes, MSN, RN, [place], [place], [place] [phone])

## 2015-03-14 NOTE — Progress Notes (Signed)
Initial Nutrition Assessment  DOCUMENTATION CODES:  Non-severe (moderate) malnutrition in context of social or environmental circumstances  INTERVENTION: - Will order Ensure Enlive BID, each supplement provides 350 kcal and 20 grams of protein - Will order Prostat BID, each supplement provides 100 kcal, 15 grams of protein - RD will continue to monitor for needs  NUTRITION DIAGNOSIS:  Increased nutrient needs related to wound healing as evidenced by estimated needs.  GOAL:  Patient will meet greater than or equal to 90% of their needs  MONITOR:  PO intake, Supplement acceptance, Weight trends, Labs, I & O's  REASON FOR ASSESSMENT:  Consult Hip fracture protocol  ASSESSMENT: Per H&P, 69 y.o. female with history of ETOH abuse HTN hyperlipidemia presents with hip pain. Patient is confused and is not able to provide a history. She was found by her son at home laying on her couch. Patient has been immobile for the last month or so. She has been drinking daily also almost a full bottle of wine. Last drink was this morning. Patient states tha she may have taken a fall in the past couple of weeks. She does have some pain in her right hip area according to the famiily notes. In the ED she had a Hip X ray done and this shows fracture of the right femoral neck fracture.  Pt seen for consult. BMI indicates normal weight status; most recent available weight is from 10/31/14 and may not be accurate to current status. No intakes documented but visualized lunch tray with 25-50% completion. Per MD notes, pt with confusion and may not be a reliable source of information.   Upon entering room, pt asked several times if her husband was outside of the room and asked throughout visit if RD was going to check on him (husband). She states she has a good appetite, no recent weight changes, and no problems chewing or swallowing. Unable to confirm as no family present and no recent weights.  Likely not meeting  needs. Physical assessment indicates moderate fat and severe muscle wasting although several areas unable to be assessed at this time. Per notes, plan for surgery 6/8 and pt would benefit from going to a facility upon d/c.  Medications reviewed. Labs reviewed; Na: 134 mmol/L, Cl: 100 mmol/L, BUN elevated, GFR: 58.  Height:  Ht Readings from Last 1 Encounters:  10/31/14 5\' 5"  (1.651 m)    Weight:  Wt Readings from Last 1 Encounters:  10/31/14 126 lb (57.153 kg)    Ideal Body Weight:  56.8 kg (kg)  Wt Readings from Last 10 Encounters:  10/31/14 126 lb (57.153 kg)  07/31/14 123 lb 6 oz (55.963 kg)  07/18/14 125 lb (56.7 kg)  09/18/12 161 lb 6 oz (73.199 kg)  05/09/12 157 lb (71.215 kg)  04/17/12 146 lb (66.225 kg)  04/13/12 151 lb 3.8 oz (68.6 kg)  03/21/12 152 lb 4 oz (69.06 kg)  03/16/12 155 lb 8 oz (70.534 kg)  03/06/12 155 lb (70.308 kg)    BMI:  21.0  Estimated Nutritional Needs:  Kcal:  1700-1900  Protein:  80-95 grams  Fluid:  2.5-3 L/day  Skin:  Wound (see comment) (stage 3 sacral pressure ulcer)  Diet Order:  Diet regular Room service appropriate?: Yes; Fluid consistency:: Thin Diet NPO time specified  EDUCATION NEEDS:  No education needs identified at this time   Intake/Output Summary (Last 24 hours) at 03/14/15 1310 Last data filed at 03/14/15 1100  Gross per 24 hour  Intake  1240 ml  Output    600 ml  Net    640 ml    Last BM:  PTA    Jarome Matin, RD, LDN Inpatient Clinical Dietitian Pager # [phone] After hours/weekend pager # [phone]

## 2015-03-14 NOTE — Clinical Social Work Placement (Signed)
   CLINICAL SOCIAL WORK PLACEMENT  NOTE  Date:  03/14/2015  Patient Details  Name: Tara Shaffer MRN: [id number] Date of Birth: 01-17-1946  Clinical Social Work is seeking post-discharge placement for this patient at the [place] level of care (*CSW will initial, date and re-position this form in  chart as items are completed):  No   Patient/family provided with [place] Work Department's list of facilities offering this level of care within the geographic area requested by the patient (or if unable, by the patient's family).  Yes   Patient/family informed of their freedom to choose among providers that offer the needed level of care, that participate in Medicare, Medicaid or managed care program needed by the patient, have an available bed and are willing to accept the patient.  Yes   Patient/family informed of [facility]'s ownership interest in [facility] and [facility], as well as of the fact that they are under no obligation to receive care at these facilities.  PASRR submitted to EDS on 03/14/15     PASRR number received on 03/14/15     Existing PASRR number confirmed on       FL2 transmitted to all facilities in geographic area requested by pt/family on 03/14/15     FL2 transmitted to all facilities within larger geographic area on       Patient informed that his/her managed care company has contracts with or will negotiate with certain facilities, including the following:        Yes   Patient/family informed of bed offers received.  Patient chooses bed at       Physician recommends and patient chooses bed at      Patient to be transferred to   on  .  Patient to be transferred to facility by       Patient family notified on   of transfer.  Name of family member notified:        PHYSICIAN       Additional Comment:    _______________________________________________ Luretha Rued, LCSW 03/14/2015, 1:10  PM

## 2015-03-14 NOTE — Clinical Social Work Placement (Signed)
   CLINICAL SOCIAL WORK PLACEMENT  NOTE  Date:  03/14/2015  Patient Details  Name: Tara Shaffer MRN: [id number] Date of Birth: 1945-10-18  Clinical Social Work is seeking post-discharge placement for this patient at the [place] level of care (*CSW will initial, date and re-position this form in  chart as items are completed):  No   Patient/family provided with [place] Work Department's list of facilities offering this level of care within the geographic area requested by the patient (or if unable, by the patient's family).  Yes   Patient/family informed of their freedom to choose among providers that offer the needed level of care, that participate in Medicare, Medicaid or managed care program needed by the patient, have an available bed and are willing to accept the patient.  Yes   Patient/family informed of [facility]'s ownership interest in [facility] and [facility], as well as of the fact that they are under no obligation to receive care at these facilities.  PASRR submitted to EDS on 03/14/15     PASRR number received on       Existing PASRR number confirmed on       FL2 transmitted to all facilities in geographic area requested by pt/family on 03/14/15     FL2 transmitted to all facilities within larger geographic area on       Patient informed that his/her managed care company has contracts with or will negotiate with certain facilities, including the following:        Yes   Patient/family informed of bed offers received.  Patient chooses bed at       Physician recommends and patient chooses bed at      Patient to be transferred to   on  .  Patient to be transferred to facility by       Patient family notified on   of transfer.  Name of family member notified:        PHYSICIAN       Additional Comment: PASRR screening will be required prior to  d/c.   _______________________________________________ Luretha Rued, LCSW 03/14/2015, 1:27 PM

## 2015-03-14 NOTE — Progress Notes (Signed)
PT Cancellation Note  Patient Details Name: Tara Shaffer MRN: [id number] DOB: [birth year]   Cancelled Treatment:     PT order received.  Deferred this am pending ortho consult and this pm based on bedrest order.  Will follow.   Reedy Biernat 03/14/2015, 4:34 PM

## 2015-03-14 NOTE — Progress Notes (Signed)
Date:  March 14, 2015 U.R. performed for needs and level of care. Will continue to follow for Case Management needs.  Velva Harman, RN, BSN, [place]   [phone]

## 2015-03-14 NOTE — Clinical Social Work Placement (Signed)
   CLINICAL SOCIAL WORK PLACEMENT  NOTE  Date:  03/14/2015  Patient Details  Name: Tara Shaffer MRN: [id number] Date of Birth: 31-Mar-1946  Clinical Social Work is seeking post-discharge placement for this patient at the [place] level of care (*CSW will initial, date and re-position this form in  chart as items are completed):  No   Patient/family provided with [place] Work Department's list of facilities offering this level of care within the geographic area requested by the patient (or if unable, by the patient's family).  Yes   Patient/family informed of their freedom to choose among providers that offer the needed level of care, that participate in Medicare, Medicaid or managed care program needed by the patient, have an available bed and are willing to accept the patient.  Yes   Patient/family informed of [facility]'s ownership interest in [facility] and [facility], as well as of the fact that they are under no obligation to receive care at these facilities.  PASRR submitted to EDS on 03/14/15     PASRR number received on 03/14/15     Existing PASRR number confirmed on       FL2 transmitted to all facilities in geographic area requested by pt/family on 03/14/15     FL2 transmitted to all facilities within larger geographic area on       Patient informed that his/her managed care company has contracts with or will negotiate with certain facilities, including the following:        Yes   Patient/family informed of bed offers received.  Patient chooses bed at       Physician recommends and patient chooses bed at      Patient to be transferred to   on  .  Patient to be transferred to facility by       Patient family notified on   of transfer.  Name of family member notified:        PHYSICIAN       Additional Comment:    _______________________________________________ Luretha Rued, LCSW 03/14/2015, 1:09  PM

## 2015-03-14 NOTE — Progress Notes (Addendum)
PATIENT DETAILS Name: Tara Shaffer Age: 69 y.o. Sex: female Date of Birth: 07/22/1946 Admit Date: 03/13/2015 Admitting Physician Allyne Gee, MD QQP:[id number] Jenny Reichmann, MD  Subjective: Pain at the right hip-both legs appear contracted at the knee-patient unable to extend. Claims she drinks one bottle of wine daily  Assessment/Plan: Active Problems: Right Hip Fracture: sustained a mechanical fall-sometime in May!Unable to walk since the fall.  Suspect sustained a hip fracture a month ago. Await Orthopedics evaluation.   DTO:IZTIWPYK IV Rocephin, poor historian-hard to tell if she has symptoms. Await urine culture.   ETOH abuse:last drink 1 day prior to admission. Slightly confused, but awake/alert and non tremulous. Continue Ativan per CIWA protocol. Follow closely  HTN:BP currently controlled without use of anti-hypertensives. Follow-claims that she does take "medications" at home.   ?Hx of Dementia: reviewed outpatient PCP note (Feb 2016)-?ETOH related dementia/chronic encephalopathy. Follow  Stage 3 Sacral Pressure ulcer:present prior to admission. Wound care eval  Disposition: Remain inpatient-will likely require SNF on discharge.   Antimicrobial agents  See below  Anti-infectives    Start     Dose/Rate Route Frequency Ordered Stop   03/14/15 2000  cefTRIAXone (ROCEPHIN) 1 g in dextrose 5 % 50 mL IVPB - Premix     1 g 100 mL/hr over 30 Minutes Intravenous Every 24 hours 03/13/15 2204     03/13/15 1900  levofloxacin (LEVAQUIN) IVPB 500 mg  Status:  Discontinued     500 mg 100 mL/hr over 60 Minutes Intravenous Every 24 hours 03/13/15 1850 03/13/15 1850   03/13/15 1900  cefTRIAXone (ROCEPHIN) 1 g in dextrose 5 % 50 mL IVPB     1 g 100 mL/hr over 30 Minutes Intravenous  Once 03/13/15 1851 03/13/15 2029      DVT Prophylaxis: Prophylactic Lovenox   Code Status: Full code o  Family Communication None at bedside  Procedures: None  CONSULTS:  orthopedic surgery  Time spent 30 minutes-Greater than 50% of this time was spent in counseling, explanation of diagnosis, planning of further management, and coordination of care.  MEDICATIONS: Scheduled Meds: . cefTRIAXone (ROCEPHIN)  IV  1 g Intravenous Q24H  . ferrous sulfate  325 mg Oral TID PC  . folic acid  1 mg Oral Daily  . multivitamin with minerals  1 tablet Oral Daily  . thiamine  100 mg Oral Daily   Or  . thiamine  100 mg Intravenous Daily   Continuous Infusions: . sodium chloride 50 mL/hr at 03/14/15 0053   PRN Meds:.bisacodyl, HYDROcodone-acetaminophen, LORazepam **OR** LORazepam, methocarbamol **OR** methocarbamol (ROBAXIN)  IV, morphine injection, senna-docusate    PHYSICAL EXAM: Vital signs in last 24 hours: Filed Vitals:   03/13/15 1930 03/13/15 2000 03/13/15 2331 03/14/15 0555  BP: 134/64 142/75 143/92 96/76  Pulse: 101 102 97 89  Temp:   98 F (36.7 C) 98.9 F (37.2 C)  TempSrc:   Oral Oral  Resp:  13 16 14   SpO2: 99% 98% 99% 98%    Weight change:  There were no vitals filed for this visit. There is no weight on file to calculate BMI.   Gen Exam: Awake and alert with clear speech.   Neck: Supple, No JVD.  Chest: B/L Clear.   CVS: S1 S2 Regular, no murmurs.  Abdomen: soft, BS +, non tender, non distended.  Extremities: no edema, lower extremities warm to touch.Both legs contracted at the knees-unable to extend  Neurologic: Non Focal.   Skin: No Rash.   Wounds: [name].    Intake/Output from previous day:  Intake/Output Summary (Last 24 hours) at 03/14/15 1117 Last data filed at 03/14/15 0556  Gross per 24 hour  Intake   1000 ml  Output    500 ml  Net    500 ml     LAB RESULTS: CBC  Recent Labs Lab 03/13/15 1544  WBC 9.8  HGB 11.3*  HCT 34.1*  PLT 441*  MCV 91.9  MCH 30.5  MCHC 33.1  RDW 12.8  LYMPHSABS 1.3  MONOABS 0.9  EOSABS 0.2  BASOSABS 0.0    Chemistries   Recent Labs Lab 03/13/15 1544  NA 134*  K 3.5  CL 100*   CO2 25  GLUCOSE 95  BUN 21*  CREATININE 0.99  CALCIUM 8.6*    CBG: No results for input(s): GLUCAP in the last 168 hours.  GFR CrCl cannot be calculated (Unknown ideal weight.).  Coagulation profile  Recent Labs Lab 03/13/15 2233  INR 1.04    Cardiac Enzymes No results for input(s): CKMB, TROPONINI, MYOGLOBIN in the last 168 hours.  Invalid input(s): CK  Invalid input(s): POCBNP No results for input(s): DDIMER in the last 72 hours. No results for input(s): HGBA1C in the last 72 hours. No results for input(s): CHOL, HDL, LDLCALC, TRIG, CHOLHDL, LDLDIRECT in the last 72 hours. No results for input(s): TSH, T4TOTAL, T3FREE, THYROIDAB in the last 72 hours.  Invalid input(s): FREET3 No results for input(s): VITAMINB12, FOLATE, FERRITIN, TIBC, IRON, RETICCTPCT in the last 72 hours.  Recent Labs  03/13/15 1544  LIPASE 29    Urine Studies No results for input(s): UHGB, CRYS in the last 72 hours.  Invalid input(s): UACOL, UAPR, USPG, UPH, UTP, UGL, UKET, UBIL, UNIT, UROB, ULEU, UEPI, UWBC, URBC, UBAC, CAST, UCOM, BILUA  MICROBIOLOGY: No results found for this or any previous visit (from the past 240 hour(s)).  RADIOLOGY STUDIES/RESULTS: Dg Chest 1 View  03/13/2015   CLINICAL DATA:  Multiple falls with increasing weakness and incontinence. Hip fracture. Initial encounter.  EXAM: CHEST  1 VIEW  COMPARISON:  04/11/2012 radiographs.  FINDINGS: 1744 hour. The heart size and mediastinal contours are stable. There are calcified right hilar and right pulmonary granulomas. The lungs are otherwise clear. There is no pleural effusion or pneumothorax. Old left-sided rib fractures noted. No acute osseous findings evident.  IMPRESSION: No acute chest findings or evidence of active cardiopulmonary process. Prior granulomatous disease and old left-sided rib fractures noted.   Electronically Signed   By: Richardean Sale M.D.   On: 03/13/2015 17:57   Dg Pelvis 1-2 Views  03/13/2015    CLINICAL DATA:  Increasing weakness over past month, incontinence, falls, ethanol abuse, fatigue, past history melanoma, hypertension  EXAM: PELVIS - 1-2 VIEW  COMPARISON:  None  FINDINGS: Severely rotated to the RIGHT.  Diffuse osseous demineralization.  Displaced RIGHT femoral neck fracture with overriding.  No dislocation.  LEFT femur appears foreshortened due to obliquity without gross fracture.  Visualize bony pelvis intact.  IMPRESSION: Displaced overriding RIGHT femoral neck fracture.   Electronically Signed   By: Lavonia Dana M.D.   On: 03/13/2015 17:00   Dg Knee 1-2 Views Left  03/13/2015   CLINICAL DATA:  Intermittent pain.  Chronic flexion deformity  EXAM: LEFT KNEE - 1-2 VIEW  COMPARISON:  None.  FINDINGS: Frontal and lateral views obtained. The knee joint is flexed with patient reportedly unable to straighten knee. No  frank dislocation. No fracture or joint effusion. There is mild generalized joint space narrowing.  IMPRESSION: No fracture or effusion. Knee flexed with patient unable to extend. No dislocations seen. Mild generalized joint space narrowing without erosion or bony destruction apparent.   Electronically Signed   By: Lowella Grip III M.D.   On: 03/13/2015 16:55   Dg Knee 1-2 Views Right  03/13/2015   CLINICAL DATA:  Right knee pain  EXAM: RIGHT KNEE - 1-2 VIEW  COMPARISON:  None.  FINDINGS: Limited evaluation secondary to flex knee in the AP view. The patient is unable to extend. No acute fracture or dislocation. No significant joint effusion. Normal soft tissues. Mild tricompartmental osteoarthritis.  IMPRESSION: No acute osseous injury of the right knee, but evaluation is limited secondary to a flexed knee in the AP view.   Electronically Signed   By: Kathreen Devoid   On: 03/13/2015 17:00   Dg Hip Unilat With Pelvis 2-3 Views Right  03/13/2015   CLINICAL DATA:  Hip pain. History of recent falls. Initial encounter.  EXAM: RIGHT HIP (WITH PELVIS) 2-3 VIEWS  COMPARISON:  None.   FINDINGS: The bones are demineralized. There is a superiorly displaced acute fracture of the right femoral neck. The femoral head appears located. No evidence of pelvic fracture.  IMPRESSION: Superiorly displaced acute fracture of the right femoral neck. No associated dislocation.   Electronically Signed   By: Richardean Sale M.D.   On: 03/13/2015 17:55    Oren Binet, MD  [facility] Pager:[phone]  If 7PM-7AM, please contact night-coverage [email or web address] Password [email or web address] 03/14/2015, 11:17 AM   LOS: 1 day

## 2015-03-15 DIAGNOSIS — S72001A Fracture of unspecified part of neck of right femur, initial encounter for closed fracture: Principal | ICD-10-CM

## 2015-03-15 DIAGNOSIS — E440 Moderate protein-calorie malnutrition: Secondary | ICD-10-CM

## 2015-03-15 LAB — COMPREHENSIVE METABOLIC PANEL
ALBUMIN: 2.3 g/dL — AB (ref 3.5–5.0)
ALT: 11 U/L — ABNORMAL LOW (ref 14–54)
ANION GAP: 7 (ref 5–15)
AST: 19 U/L (ref 15–41)
Alkaline Phosphatase: 95 U/L (ref 38–126)
BUN: 17 mg/dL (ref 6–20)
CALCIUM: 8.3 mg/dL — AB (ref 8.9–10.3)
CO2: 20 mmol/L — ABNORMAL LOW (ref 22–32)
Chloride: 113 mmol/L — ABNORMAL HIGH (ref 101–111)
Creatinine, Ser: 0.82 mg/dL (ref 0.44–1.00)
GFR calc Af Amer: >60 mL/min (ref >60)
GFR calc non Af Amer: >60 mL/min (ref >60)
Glucose, Bld: 89 mg/dL (ref 65–99)
Potassium: 2.8 mmol/L — ABNORMAL LOW (ref 3.5–5.1)
SODIUM: 140 mmol/L (ref 135–145)
Total Bilirubin: 0.3 mg/dL (ref 0.3–1.2)
Total Protein: 5.8 g/dL — ABNORMAL LOW (ref 6.5–8.1)

## 2015-03-15 LAB — URINE CULTURE

## 2015-03-15 MED ORDER — POTASSIUM CHLORIDE 10 MEQ/100ML IV SOLN
10.0000 meq | INTRAVENOUS | Status: DC
  Administered 2015-03-15 (×3): 10 meq via INTRAVENOUS
  Filled 2015-03-15 (×4): qty 100

## 2015-03-15 MED ORDER — SPIRITUS FRUMENTI
1.0000 | Freq: Every day | ORAL | Status: DC
Start: 2015-03-15 — End: 2015-03-18
  Administered 2015-03-17 – 2015-03-18 (×3): 1 via ORAL

## 2015-03-15 NOTE — Plan of Care (Signed)
Problem: Consults Goal: Hip/Femur Fracture Patient Education See Patient Education Module for education specifics.  Outcome: Not Met (add Reason) Patient has dementia     

## 2015-03-15 NOTE — Progress Notes (Signed)
Explained to patient she will be staying in the hospital today, no surgery, started Heart Healthy diet.  Lavell Anchors RN \

## 2015-03-15 NOTE — Progress Notes (Addendum)
PATIENT DETAILS Name: Tara Shaffer Age: 69 y.o. Sex: female Date of Birth: 08-30-1946 Admit Date: 03/13/2015 Admitting Physician Allyne Gee, MD EZM:[id number] Jenny Reichmann, MD  Subjective: Wants a glass of wine. Denies pain.  Assessment/Plan: Active Problems: Right Hip Fracture: sustained a mechanical fall-sometime in May!Unable to walk since the fall.  Suspect sustained a hip fracture a month ago. Seems to have developed contracture of b/l lower ext (at level on knees)-note this was present prior to admission.Orthopedics planning ORIF later today.   MLY:YTKPTWSF IV Rocephin, poor historian-hard to tell if she has symptoms. Await urine culture.   Hypokalemia:replete and recheck  ETOH abuse:last drink 1 day prior to admission. Slightly confused, but awake/alert and non tremulous. Continue Ativan per CIWA protocol. Follow closely  HTN:BP currently controlled without use of anti-hypertensives. Follow-will resume home meds when able.   ?Hx of Dementia: reviewed outpatient PCP note (Feb 2016)-?ETOH related dementia/chronic encephalopathy. Follow-but seems awake and alert today  Stage 3 Sacral Pressure ulcer:present prior to admission. Wound care eval appreciated  Non-severe (moderate) malnutrition in context of social or environmental circumstances:continue supplements  Disposition: Remain inpatient-will likely require SNF on discharge.   Antimicrobial agents  See below  Anti-infectives    Start     Dose/Rate Route Frequency Ordered Stop   03/14/15 2000  cefTRIAXone (ROCEPHIN) 1 g in dextrose 5 % 50 mL IVPB - Premix     1 g 100 mL/hr over 30 Minutes Intravenous Every 24 hours 03/13/15 2204     03/13/15 1900  levofloxacin (LEVAQUIN) IVPB 500 mg  Status:  Discontinued     500 mg 100 mL/hr over 60 Minutes Intravenous Every 24 hours 03/13/15 1850 03/13/15 1850   03/13/15 1900  cefTRIAXone (ROCEPHIN) 1 g in dextrose 5 % 50 mL IVPB     1 g 100 mL/hr over 30 Minutes  Intravenous  Once 03/13/15 1851 03/13/15 2029      DVT Prophylaxis: Prophylactic Lovenox   Code Status: Full code   Family Communication None at bedside  Procedures: None  CONSULTS:  orthopedic surgery  Time spent 20 minutes-Greater than 50% of this time was spent in counseling, explanation of diagnosis, planning of further management, and coordination of care.  MEDICATIONS: Scheduled Meds: . cefTRIAXone (ROCEPHIN)  IV  1 g Intravenous Q24H  . enoxaparin (LOVENOX) injection  40 mg Subcutaneous Q24H  . feeding supplement (ENSURE ENLIVE)  237 mL Oral BID BM  . feeding supplement (PRO-STAT 64)  30 mL Oral BID BM  . ferrous sulfate  325 mg Oral TID PC  . folic acid  1 mg Oral Daily  . multivitamin with minerals  1 tablet Oral Daily  . potassium chloride  10 mEq Intravenous Q1 Hr x 4  . thiamine  100 mg Oral Daily   Or  . thiamine  100 mg Intravenous Daily   Continuous Infusions: . sodium chloride 50 mL/hr at 03/14/15 0053   PRN Meds:.bisacodyl, HYDROcodone-acetaminophen, LORazepam **OR** LORazepam, methocarbamol **OR** methocarbamol (ROBAXIN)  IV, morphine injection, senna-docusate    PHYSICAL EXAM: Vital signs in last 24 hours: Filed Vitals:   03/14/15 1500 03/14/15 2253 03/15/15 0621 03/15/15 1028  BP: 142/65 114/51 133/61 143/72  Pulse: 107 82 88 90  Temp: 98.3 F (36.8 C) 99 F (37.2 C) 97.9 F (36.6 C) 98.7 F (37.1 C)  TempSrc:  Oral Oral Oral  Resp: 16 16 16 16   SpO2: 97% 100%  99% 98%    Weight change:  There were no vitals filed for this visit. There is no weight on file to calculate BMI.   Gen Exam: Awake and alert with clear speech.   Neck: Supple, No JVD.  Chest: B/L Clear.  No rales or rhonchi CVS: S1 S2 Regular, no murmurs.  Abdomen: soft, BS +, non tender, non distended.  Extremities: no edema, lower extremities warm to touch.Both legs contracted at the knees-unable to extend Neurologic: Non Focal.   Skin: No Rash.   Wounds: [name].     Intake/Output from previous day:  Intake/Output Summary (Last 24 hours) at 03/15/15 1036 Last data filed at 03/15/15 0742  Gross per 24 hour  Intake 1335.84 ml  Output    575 ml  Net 760.84 ml     LAB RESULTS: CBC  Recent Labs Lab 03/13/15 1544  WBC 9.8  HGB 11.3*  HCT 34.1*  PLT 441*  MCV 91.9  MCH 30.5  MCHC 33.1  RDW 12.8  LYMPHSABS 1.3  MONOABS 0.9  EOSABS 0.2  BASOSABS 0.0    Chemistries   Recent Labs Lab 03/13/15 1544 03/15/15 0500  NA 134* 140  K 3.5 2.8*  CL 100* 113*  CO2 25 20*  GLUCOSE 95 89  BUN 21* 17  CREATININE 0.99 0.82  CALCIUM 8.6* 8.3*    CBG: No results for input(s): GLUCAP in the last 168 hours.  GFR CrCl cannot be calculated (Unknown ideal weight.).  Coagulation profile  Recent Labs Lab 03/13/15 2233  INR 1.04    Cardiac Enzymes No results for input(s): CKMB, TROPONINI, MYOGLOBIN in the last 168 hours.  Invalid input(s): CK  Invalid input(s): POCBNP No results for input(s): DDIMER in the last 72 hours. No results for input(s): HGBA1C in the last 72 hours. No results for input(s): CHOL, HDL, LDLCALC, TRIG, CHOLHDL, LDLDIRECT in the last 72 hours. No results for input(s): TSH, T4TOTAL, T3FREE, THYROIDAB in the last 72 hours.  Invalid input(s): FREET3 No results for input(s): VITAMINB12, FOLATE, FERRITIN, TIBC, IRON, RETICCTPCT in the last 72 hours.  Recent Labs  03/13/15 1544  LIPASE 29    Urine Studies No results for input(s): UHGB, CRYS in the last 72 hours.  Invalid input(s): UACOL, UAPR, USPG, UPH, UTP, UGL, UKET, UBIL, UNIT, UROB, ULEU, UEPI, UWBC, URBC, UBAC, CAST, UCOM, BILUA  MICROBIOLOGY: No results found for this or any previous visit (from the past 240 hour(s)).  RADIOLOGY STUDIES/RESULTS: Dg Chest 1 View  03/13/2015   CLINICAL DATA:  Multiple falls with increasing weakness and incontinence. Hip fracture. Initial encounter.  EXAM: CHEST  1 VIEW  COMPARISON:  04/11/2012 radiographs.  FINDINGS:  1744 hour. The heart size and mediastinal contours are stable. There are calcified right hilar and right pulmonary granulomas. The lungs are otherwise clear. There is no pleural effusion or pneumothorax. Old left-sided rib fractures noted. No acute osseous findings evident.  IMPRESSION: No acute chest findings or evidence of active cardiopulmonary process. Prior granulomatous disease and old left-sided rib fractures noted.   Electronically Signed   By: Richardean Sale M.D.   On: 03/13/2015 17:57   Dg Pelvis 1-2 Views  03/13/2015   CLINICAL DATA:  Increasing weakness over past month, incontinence, falls, ethanol abuse, fatigue, past history melanoma, hypertension  EXAM: PELVIS - 1-2 VIEW  COMPARISON:  None  FINDINGS: Severely rotated to the RIGHT.  Diffuse osseous demineralization.  Displaced RIGHT femoral neck fracture with overriding.  No dislocation.  LEFT femur appears foreshortened  due to obliquity without gross fracture.  Visualize bony pelvis intact.  IMPRESSION: Displaced overriding RIGHT femoral neck fracture.   Electronically Signed   By: Lavonia Dana M.D.   On: 03/13/2015 17:00   Dg Knee 1-2 Views Left  03/13/2015   CLINICAL DATA:  Intermittent pain.  Chronic flexion deformity  EXAM: LEFT KNEE - 1-2 VIEW  COMPARISON:  None.  FINDINGS: Frontal and lateral views obtained. The knee joint is flexed with patient reportedly unable to straighten knee. No frank dislocation. No fracture or joint effusion. There is mild generalized joint space narrowing.  IMPRESSION: No fracture or effusion. Knee flexed with patient unable to extend. No dislocations seen. Mild generalized joint space narrowing without erosion or bony destruction apparent.   Electronically Signed   By: Lowella Grip III M.D.   On: 03/13/2015 16:55   Dg Knee 1-2 Views Right  03/13/2015   CLINICAL DATA:  Right knee pain  EXAM: RIGHT KNEE - 1-2 VIEW  COMPARISON:  None.  FINDINGS: Limited evaluation secondary to flex knee in the AP view. The  patient is unable to extend. No acute fracture or dislocation. No significant joint effusion. Normal soft tissues. Mild tricompartmental osteoarthritis.  IMPRESSION: No acute osseous injury of the right knee, but evaluation is limited secondary to a flexed knee in the AP view.   Electronically Signed   By: Kathreen Devoid   On: 03/13/2015 17:00   Dg Hip Unilat With Pelvis 2-3 Views Right  03/13/2015   CLINICAL DATA:  Hip pain. History of recent falls. Initial encounter.  EXAM: RIGHT HIP (WITH PELVIS) 2-3 VIEWS  COMPARISON:  None.  FINDINGS: The bones are demineralized. There is a superiorly displaced acute fracture of the right femoral neck. The femoral head appears located. No evidence of pelvic fracture.  IMPRESSION: Superiorly displaced acute fracture of the right femoral neck. No associated dislocation.   Electronically Signed   By: Richardean Sale M.D.   On: 03/13/2015 17:55    Oren Binet, MD  [facility] Pager:[phone]  If 7PM-7AM, please contact night-coverage [email or web address] Password [email or web address] 03/15/2015, 10:36 AM   LOS: 2 days

## 2015-03-15 NOTE — Progress Notes (Signed)
PT Cancellation Note  Patient Details Name: Tara Shaffer MRN: [id number] DOB: [birth year]   Cancelled Treatment:     PT deferred this date.  Ortho recommending against surgery at this time but bed rest orders still in place.  Will follow.   Bolivar Koranda 03/15/2015, 3:38 PM

## 2015-03-15 NOTE — Progress Notes (Signed)
OT Cancellation Note  Patient Details Name: Tara Shaffer MRN: [id number] DOB: [birth year]   Cancelled Treatment:    Reason Eval/Treat Not Completed: Other (comment).  Noted pt's plan is for SNF.  Will defer OT to that venue.  Bosco Paparella 03/15/2015, 7:23 AM  Lesle Chris, OTR/L [phone] 03/15/2015

## 2015-03-15 NOTE — Progress Notes (Signed)
Spoke to Dr. Delene Ruffini regarding patient's order of wine, pharmacy does not have wine in stock.  Pharmacy has whiskey and beer available, but no order for either one by MD.  Was instructed to tell patient, we do not have wine available.  Lavell Anchors RN.

## 2015-03-15 NOTE — Progress Notes (Signed)
Discussed diagnosis with the patient as well as the treatment options. She admits that she hasn't been ambulatory in "some time." Says she fell in May, injuring her hip, however it is not hurting very much currently. 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 She has skin breakdown over her sacrum and LEs. Pain control is reasonable currently. Given the whole situation, I would not recommend surgery. The patient agrees. She may transfer bed to chair. D/c planning.

## 2015-03-16 LAB — CBC
HCT: 25.5 % — ABNORMAL LOW (ref 36.0–46.0)
Hemoglobin: 8.5 g/dL — ABNORMAL LOW (ref 12.0–15.0)
MCH: 31.7 pg (ref 26.0–34.0)
MCHC: 33.3 g/dL (ref 30.0–36.0)
MCV: 95.1 fL (ref 78.0–100.0)
PLATELETS: 285 10*3/uL (ref 150–400)
RBC: 2.68 MIL/uL — ABNORMAL LOW (ref 3.87–5.11)
RDW: 13.2 % (ref 11.5–15.5)
WBC: 4.7 10*3/uL (ref 4.0–10.5)

## 2015-03-16 LAB — BASIC METABOLIC PANEL
Anion gap: 7 (ref 5–15)
BUN: 14 mg/dL (ref 6–20)
CHLORIDE: 109 mmol/L (ref 101–111)
CO2: 23 mmol/L (ref 22–32)
CREATININE: 0.71 mg/dL (ref 0.44–1.00)
Calcium: 8.3 mg/dL — ABNORMAL LOW (ref 8.9–10.3)
GFR calc Af Amer: >60 mL/min (ref >60)
GFR calc non Af Amer: >60 mL/min (ref >60)
Glucose, Bld: 89 mg/dL (ref 65–99)
POTASSIUM: 3 mmol/L — AB (ref 3.5–5.1)
Sodium: 139 mmol/L (ref 135–145)

## 2015-03-16 LAB — MAGNESIUM: Magnesium: 1.9 mg/dL (ref 1.7–2.4)

## 2015-03-16 MED ORDER — POTASSIUM CHLORIDE CRYS ER 20 MEQ PO TBCR
40.0000 meq | EXTENDED_RELEASE_TABLET | Freq: Once | ORAL | Status: AC
  Administered 2015-03-16: 40 meq via ORAL
  Filled 2015-03-16: qty 2

## 2015-03-16 MED ORDER — POTASSIUM CHLORIDE 2 MEQ/ML IV SOLN
INTRAVENOUS | Status: DC
Start: 2015-03-16 — End: 2015-03-17
  Administered 2015-03-16: 13:00:00 via INTRAVENOUS
  Filled 2015-03-16 (×2): qty 1000

## 2015-03-16 NOTE — Progress Notes (Signed)
Pt's son brought in a bottle of white wine for pt to have. Writer took wine down to pharmacy. Pt's son states he does NOT want the wine back after pt leaves. Dispose per protocol.

## 2015-03-16 NOTE — Evaluation (Signed)
Physical Therapy Evaluation Patient Details Name: Tara Shaffer MRN: [id number] DOB: 11/27/1945 Today's Date: 03/16/2015   History of Present Illness  CAROLEEN STOERMER is a 69 y.o. female with history of ETOH abuse HTN hyperlipidemia presents with hip pain. Pt has been immobile at home since a fall in May? And a current hip x-ray reveals R femoral neck fracture.Ortho consulted and due to her severe hip and knee flexion contractures and skin breakdown, surgery not recommended. Ortho has cleared bed to chair transfers.   Clinical Impression  Pt with R hip fracture from possible back in May with a fall. Pt has not been mobile at home since. PT presents with B hip and knee flexion contractures and skin breakdown. Nursing working with pt with rolling and positioning while here, recommend SNF and to be assessed by PT/OT at the facility in order to assist with positioning in that environment and flexion contracture management. No other needs for PT in Acute setting.     Follow Up Recommendations SNF    Equipment Recommendations       Recommendations for Other Services       Precautions / Restrictions        Mobility  Bed Mobility Overal bed mobility: +2 for physical assistance;Needs Assistance Bed Mobility: Supine to Sit;Sit to Supine     Supine to sit: +2 for physical assistance;Total assist Sit to supine: +2 for physical assistance;Total assist   General bed mobility comments: Pt on air mattress so difficult to move, however only tried to help assist with UEs for balance and rolling . Tried to roll pt supine to asssit with gravity for flexion contracture, pt only tolerated supine lying for movments at a time and prefers to lie on her Left side. positioned being performed by nursing to encourage other sides for pt.   Transfers                 General transfer comment: only sat EOB for about 5 minutes with Max to total assist of one and 2 persons at all times. Pt did  follow commands to try to hold her head up , and help with hips and knees at 90 degrees flexion, but was unable to get them beyond this. So pt was a MAX to total for sitting balance, but feel she may improve with time at a SNF with rehab.   Ambulation/Gait Ambulation/Gait assistance:  (unable. )              Stairs            Wheelchair Mobility    Modified Rankin (Stroke Patients Only)       Balance                                             Pertinent Vitals/Pain Pain Assessment:  (difficult to asses, but would not let me move knees and hips from almost fetal position )    Home Living Family/patient expects to be discharged to:: Skilled nursing facility Living Arrangements: Spouse/significant other (pt's husband has just had a stroke and is in SNF at [facility]. )                    Prior Function Level of Independence:  (unknown.. priro to fall, sounded like pt was mobile , but unclear. . Since fall back in May  pt has not been ambulating. )               Hand Dominance        Extremity/Trunk Assessment   Upper Extremity Assessment:  (noted wekaness in UE but no contractures noticed. )           Lower Extremity Assessment: RLE deficits/detail;LLE deficits/detail RLE Deficits / Details: Both R and LLE contractured with hip flexion slighly greater than 90 dgree flexion contracures (R contracted greater than L) , and knee flexion contractures at about 95 degrees. Was not able to move R foot as well.  LLE Deficits / Details: (see R sided deficits for L reference) . Was able to move L foot at toes and some at ankle.      Communication   Communication: No difficulties (recall of events not clear, however very pleasnat and cooperative, follows commands. )  Cognition Arousal/Alertness: Awake/alert Behavior During Therapy: WFL for tasks assessed/performed Overall Cognitive Status: Within Functional Limits for tasks assessed                       General Comments      Exercises        Assessment/Plan    PT Assessment All further PT needs can be met in the next venue of care (encourage PT/OT at next venue to assist with position and flexion contracture management. )  PT Diagnosis Generalized weakness;Other (comment) (flexion contractures)   PT Problem List Decreased range of motion;Decreased activity tolerance;Decreased balance;Decreased mobility  PT Treatment Interventions     PT Goals (Current goals can be found in the Care Plan section) Acute Rehab PT Goals Patient Stated Goal: When I asked if she would like to get better, she stated, "I am not sure".  PT Goal Formulation: All assessment and education complete, DC therapy (deficits to be addressed at next venue)    Frequency     Barriers to discharge        Co-evaluation               End of Session   Activity Tolerance: Patient limited by fatigue Patient left: in bed;with call bell/phone within reach Nurse Communication: Mobility status;Need for lift equipment         Time: 0315-9458 PT Time Calculation (min) (ACUTE ONLY): 23 min   Charges:   PT Evaluation $Initial PT Evaluation Tier I: 1 Procedure PT Treatments $Therapeutic Activity: 8-22 mins   PT G CodesClide Dales Apr 03, 2015, 6:10 PM Clide Dales, PT Pager: [phone] 04/03/2015

## 2015-03-16 NOTE — Progress Notes (Signed)
PATIENT DETAILS Name: Tara Shaffer Age: 69 y.o. Sex: female Date of Birth: Sep 05, 1946 Admit Date: 03/13/2015 Admitting Physician Allyne Gee, MD [id number] Jenny Reichmann, MD  Subjective: Denies pain.  Assessment/Plan: Active Problems: Right Hip Fracture: sustained a mechanical fall-sometime in May!Unable to walk since the fall.  Suspect sustained a hip fracture a month ago. Seems to have developed contracture of b/l lower ext (at level on knees/hips)-note this was present prior to admission.Orthopedics does not recommend surgery. Will need to discharge to SNF.  UTI: Will complete 3 doses of IV Rocephin  and then discontinue on 6/20, poor historian-hard to tell if she has symptoms. Urine culture shows multiple bacterial morphotypes   Hypokalemia:replete and recheck  ETOH abuse:last drink 1 day prior to admission. Minimally confused, but awake/alert and non tremulous. Continue Ativan per CIWA protocol. Follow closely  HTN:BP currently controlled without use of anti-hypertensives. Follow-will resume home meds when able.   ?Hx of Dementia: reviewed outpatient PCP note (Feb 2016)-?ETOH related dementia/chronic encephalopathy. Follow-but seems awake and alert today  Stage 3 Sacral Pressure ulcer:present prior to admission. Wound care eval appreciated  Non-severe (moderate) malnutrition in context of social or environmental circumstances:continue supplements  Disposition: Remain inpatient-will likely require SNF on discharge.   Antimicrobial agents  See below  Anti-infectives    Start     Dose/Rate Route Frequency Ordered Stop   03/14/15 2000  cefTRIAXone (ROCEPHIN) 1 g in dextrose 5 % 50 mL IVPB - Premix     1 g 100 mL/hr over 30 Minutes Intravenous Every 24 hours 03/13/15 2204     03/13/15 1900  levofloxacin (LEVAQUIN) IVPB 500 mg  Status:  Discontinued     500 mg 100 mL/hr over 60 Minutes Intravenous Every 24 hours 03/13/15 1850 03/13/15 1850   03/13/15 1900   cefTRIAXone (ROCEPHIN) 1 g in dextrose 5 % 50 mL IVPB     1 g 100 mL/hr over 30 Minutes Intravenous  Once 03/13/15 1851 03/13/15 2029      DVT Prophylaxis: Prophylactic Lovenox   Code Status: Full code   Family Communication Left message for son Delfino Lovett  Procedures: None  CONSULTS:  orthopedic surgery  Time spent 20 minutes-Greater than 50% of this time was spent in counseling, explanation of diagnosis, planning of further management, and coordination of care.  MEDICATIONS: Scheduled Meds: . cefTRIAXone (ROCEPHIN)  IV  1 g Intravenous Q24H  . enoxaparin (LOVENOX) injection  40 mg Subcutaneous Q24H  . feeding supplement (ENSURE ENLIVE)  237 mL Oral BID BM  . feeding supplement (PRO-STAT 64)  30 mL Oral BID BM  . ferrous sulfate  325 mg Oral TID PC  . folic acid  1 mg Oral Daily  . multivitamin with minerals  1 tablet Oral Daily  . spiritus frumenti  1 each Oral Daily  . thiamine  100 mg Oral Daily   Or  . thiamine  100 mg Intravenous Daily   Continuous Infusions: . sodium chloride 50 mL/hr at 03/14/15 0053   PRN Meds:.bisacodyl, HYDROcodone-acetaminophen, LORazepam **OR** LORazepam, methocarbamol **OR** methocarbamol (ROBAXIN)  IV, morphine injection, senna-docusate    PHYSICAL EXAM: Vital signs in last 24 hours: Filed Vitals:   03/15/15 1416 03/15/15 2227 03/16/15 0105 03/16/15 0549  BP: 131/83 146/74 151/87 147/75  Pulse: 90 92 94 92  Temp: 98.4 F (36.9 C) 98.1 F (36.7 C) 97.8 F (36.6 C) 98.1 F (36.7 C)  TempSrc: Oral  Oral Oral Oral  Resp: 16 18 16    SpO2: 99% 99% 98% 99%    Weight change:  There were no vitals filed for this visit. There is no weight on file to calculate BMI.   Gen Exam: Awake and alert with clear speech.   Neck: Supple, No JVD.  Chest: B/L Clear.  No rales or rhonchi CVS: S1 S2 Regular, no murmurs.  Abdomen: soft, BS +, non tender, non distended.  Extremities: no edema, lower extremities warm to touch.Both legs contracted  at the knees-unable to extend Neurologic: Non Focal.   Skin: No Rash.   Wounds: [name].    Intake/Output from previous day:  Intake/Output Summary (Last 24 hours) at 03/16/15 1133 Last data filed at 03/15/15 1820  Gross per 24 hour  Intake 286.67 ml  Output    350 ml  Net -63.33 ml     LAB RESULTS: CBC  Recent Labs Lab 03/13/15 1544 03/16/15 0500  WBC 9.8 4.7  HGB 11.3* 8.5*  HCT 34.1* 25.5*  PLT 441* 285  MCV 91.9 95.1  MCH 30.5 31.7  MCHC 33.1 33.3  RDW 12.8 13.2  LYMPHSABS 1.3  --   MONOABS 0.9  --   EOSABS 0.2  --   BASOSABS 0.0  --     Chemistries   Recent Labs Lab 03/13/15 1544 03/15/15 0500 03/16/15 0500  NA 134* 140 139  K 3.5 2.8* 3.0*  CL 100* 113* 109  CO2 25 20* 23  GLUCOSE 95 89 89  BUN 21* 17 14  CREATININE 0.99 0.82 0.71  CALCIUM 8.6* 8.3* 8.3*  MG  --   --  1.9    CBG: No results for input(s): GLUCAP in the last 168 hours.  GFR CrCl cannot be calculated (Unknown ideal weight.).  Coagulation profile  Recent Labs Lab 03/13/15 2233  INR 1.04    Cardiac Enzymes No results for input(s): CKMB, TROPONINI, MYOGLOBIN in the last 168 hours.  Invalid input(s): CK  Invalid input(s): POCBNP No results for input(s): DDIMER in the last 72 hours. No results for input(s): HGBA1C in the last 72 hours. No results for input(s): CHOL, HDL, LDLCALC, TRIG, CHOLHDL, LDLDIRECT in the last 72 hours. No results for input(s): TSH, T4TOTAL, T3FREE, THYROIDAB in the last 72 hours.  Invalid input(s): FREET3 No results for input(s): VITAMINB12, FOLATE, FERRITIN, TIBC, IRON, RETICCTPCT in the last 72 hours.  Recent Labs  03/13/15 1544  LIPASE 29    Urine Studies No results for input(s): UHGB, CRYS in the last 72 hours.  Invalid input(s): UACOL, UAPR, USPG, UPH, UTP, UGL, UKET, UBIL, UNIT, UROB, ULEU, UEPI, UWBC, URBC, UBAC, CAST, UCOM, BILUA  MICROBIOLOGY: Recent Results (from the past 240 hour(s))  Urine culture     Status: None    Collection Time: 03/13/15  5:47 PM  Result Value Ref Range Status   Specimen Description Urine  Final   Special Requests NONE  Final   Culture   Final    MULTIPLE SPECIES PRESENT, SUGGEST RECOLLECTION IF CLINICALLY INDICATED Performed at [facility]    Report Status 03/15/2015 FINAL  Final    RADIOLOGY STUDIES/RESULTS: Dg Chest 1 View  03/13/2015   CLINICAL DATA:  Multiple falls with increasing weakness and incontinence. Hip fracture. Initial encounter.  EXAM: CHEST  1 VIEW  COMPARISON:  04/11/2012 radiographs.  FINDINGS: 1744 hour. The heart size and mediastinal contours are stable. There are calcified right hilar and right pulmonary granulomas. The lungs are otherwise clear. There  is no pleural effusion or pneumothorax. Old left-sided rib fractures noted. No acute osseous findings evident.  IMPRESSION: No acute chest findings or evidence of active cardiopulmonary process. Prior granulomatous disease and old left-sided rib fractures noted.   Electronically Signed   By: Richardean Sale M.D.   On: 03/13/2015 17:57   Dg Pelvis 1-2 Views  03/13/2015   CLINICAL DATA:  Increasing weakness over past month, incontinence, falls, ethanol abuse, fatigue, past history melanoma, hypertension  EXAM: PELVIS - 1-2 VIEW  COMPARISON:  None  FINDINGS: Severely rotated to the RIGHT.  Diffuse osseous demineralization.  Displaced RIGHT femoral neck fracture with overriding.  No dislocation.  LEFT femur appears foreshortened due to obliquity without gross fracture.  Visualize bony pelvis intact.  IMPRESSION: Displaced overriding RIGHT femoral neck fracture.   Electronically Signed   By: Lavonia Dana M.D.   On: 03/13/2015 17:00   Dg Knee 1-2 Views Left  03/13/2015   CLINICAL DATA:  Intermittent pain.  Chronic flexion deformity  EXAM: LEFT KNEE - 1-2 VIEW  COMPARISON:  None.  FINDINGS: Frontal and lateral views obtained. The knee joint is flexed with patient reportedly unable to straighten knee. No frank  dislocation. No fracture or joint effusion. There is mild generalized joint space narrowing.  IMPRESSION: No fracture or effusion. Knee flexed with patient unable to extend. No dislocations seen. Mild generalized joint space narrowing without erosion or bony destruction apparent.   Electronically Signed   By: Lowella Grip III M.D.   On: 03/13/2015 16:55   Dg Knee 1-2 Views Right  03/13/2015   CLINICAL DATA:  Right knee pain  EXAM: RIGHT KNEE - 1-2 VIEW  COMPARISON:  None.  FINDINGS: Limited evaluation secondary to flex knee in the AP view. The patient is unable to extend. No acute fracture or dislocation. No significant joint effusion. Normal soft tissues. Mild tricompartmental osteoarthritis.  IMPRESSION: No acute osseous injury of the right knee, but evaluation is limited secondary to a flexed knee in the AP view.   Electronically Signed   By: Kathreen Devoid   On: 03/13/2015 17:00   Dg Hip Unilat With Pelvis 2-3 Views Right  03/13/2015   CLINICAL DATA:  Hip pain. History of recent falls. Initial encounter.  EXAM: RIGHT HIP (WITH PELVIS) 2-3 VIEWS  COMPARISON:  None.  FINDINGS: The bones are demineralized. There is a superiorly displaced acute fracture of the right femoral neck. The femoral head appears located. No evidence of pelvic fracture.  IMPRESSION: Superiorly displaced acute fracture of the right femoral neck. No associated dislocation.   Electronically Signed   By: Richardean Sale M.D.   On: 03/13/2015 17:55    Oren Binet, MD  [facility] Pager:[phone]  If 7PM-7AM, please contact night-coverage [email or web address] Password [email or web address] 03/16/2015, 11:33 AM   LOS: 3 days

## 2015-03-16 NOTE — Progress Notes (Signed)
Pt's son stated to nurse that he would like for his mother to be placed in [facility] where her husband is currently at post his stroke.

## 2015-03-17 MED ORDER — HYDROCODONE-ACETAMINOPHEN 5-325 MG PO TABS: 1.0000 | ORAL_TABLET | Freq: Four times a day (QID) | ORAL | Status: DC | PRN

## 2015-03-17 MED ORDER — SPIRITUS FRUMENTI: 1.0000 | Freq: Every day | ORAL | Status: DC

## 2015-03-17 MED ORDER — FERROUS SULFATE 325 (65 FE) MG PO TABS: 325.0000 mg | ORAL_TABLET | Freq: Three times a day (TID) | ORAL | Status: DC

## 2015-03-17 MED ORDER — POTASSIUM CHLORIDE CRYS ER 20 MEQ PO TBCR
40.0000 meq | EXTENDED_RELEASE_TABLET | Freq: Once | ORAL | Status: AC
  Administered 2015-03-17: 40 meq via ORAL
  Filled 2015-03-17: qty 2

## 2015-03-17 MED ORDER — PRO-STAT 64 PO LIQD: 30.0000 mL | Freq: Two times a day (BID) | ORAL | Status: DC

## 2015-03-17 MED ORDER — THIAMINE HCL 100 MG PO TABS: 100.0000 mg | ORAL_TABLET | Freq: Every day | ORAL | Status: DC

## 2015-03-17 MED ORDER — ADULT MULTIVITAMIN W/MINERALS CH: 1.0000 | ORAL_TABLET | Freq: Every day | ORAL | Status: DC

## 2015-03-17 MED ORDER — ENSURE ENLIVE PO LIQD: 237.0000 mL | Freq: Two times a day (BID) | ORAL | Status: DC

## 2015-03-17 MED ORDER — FOLIC ACID 1 MG PO TABS: 1.0000 mg | ORAL_TABLET | Freq: Every day | ORAL | Status: DC

## 2015-03-17 MED ORDER — HALOPERIDOL LACTATE 5 MG/ML IJ SOLN
2.0000 mg | Freq: Four times a day (QID) | INTRAMUSCULAR | Status: DC | PRN
  Administered 2015-03-17: 2 mg via INTRAVENOUS
  Filled 2015-03-17: qty 1

## 2015-03-17 MED ORDER — AMLODIPINE BESYLATE 5 MG PO TABS: 5.0000 mg | ORAL_TABLET | Freq: Every day | ORAL | Status: DC

## 2015-03-17 NOTE — Progress Notes (Signed)
Clinical Social Work  Per MD patient medically stable to DC. Patient required level 2, face-to-face evaluation by [facility]. Patient was evaluated by [facility] through [facility] but no number posted as of yet. CSW made MD aware of DC barrier.  CSW spoke with patient's son. Son prefers placement at [facility] but [facility] unsure if they can offer a bed. CSW provided son with offers of [facility] vs [facility]. Son reports he will have alternative facility chosen in case [facility] is unable to accept.  CSW will continue to follow.  [place], [place] [phone]

## 2015-03-17 NOTE — Discharge Summary (Signed)
PATIENT DETAILS Name: Tara Shaffer Age: 69 y.o. Sex: female Date of Birth: 1946-09-11 MRN: [id number]. Admitting Physician: Allyne Gee, MD MBW:[id number] Jenny Reichmann, MD  Admit Date: 03/13/2015 Discharge date: 03/17/2015  Recommendations for Outpatient Follow-up:  Please repeat CBC and BMET in 1 week  Family requesting to continue Foley catheter on discharge-suggest keep it in for 2-3 weeks and discontinue if sacral decubitus ulcer is better.  PRIMARY DISCHARGE DIAGNOSIS:  Active Problems:   Essential hypertension   Alcohol abuse   Closed right hip fracture   UTI (lower urinary tract infection)   Hyperlipidemia   Pressure ulcer   Malnutrition of moderate degree      PAST MEDICAL HISTORY: Past Medical History  Diagnosis Date  . MELANOMA, HX OF 08/04/2007  . ALLERGIC RHINITIS 06/23/2007  . ANXIETY DEPRESSION 06/12/2009  . ANXIETY 06/23/2007  . 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 GERD 06/23/2007  . HYPERLIPIDEMIA 06/24/2007  . HYPERTENSION 06/23/2007  . COLONIC POLYPS, HX OF 03/08/2002  . DIVERTICULOSIS, COLON 08/04/2007  . Irritable bowel syndrome 06/23/2007  . Memory dysfunction 06/23/2011  . Cancer     skin cx/ hx melanoma  . LOW BACK PAIN 06/24/2007  . Alcohol abuse 02/27/2012    DISCHARGE MEDICATIONS: Current Discharge Medication List    START taking these medications   Details  Amino Acids-Protein Hydrolys (FEEDING SUPPLEMENT, PRO-STAT 64,) LIQD Take 30 mLs by mouth 2 (two) times daily between meals.    amLODipine (NORVASC) 5 MG tablet Take 1 tablet (5 mg total) by mouth daily.    feeding supplement, ENSURE ENLIVE, (ENSURE ENLIVE) LIQD Take 237 mLs by mouth 2 (two) times daily between meals.    ferrous sulfate 325 (65 FE) MG tablet Take 1 tablet (325 mg total) by mouth 3 (three) times daily after meals.    folic acid (FOLVITE) 1 MG tablet Take 1 tablet (1 mg total) by mouth daily.    HYDROcodone-acetaminophen (NORCO/VICODIN) 5-325 MG per tablet  Take 1-2 tablets by mouth every 6 (six) hours as needed for moderate pain. Qty: 30 tablet, Refills: 0    Multiple Vitamin (MULTIVITAMIN WITH MINERALS) TABS tablet Take 1 tablet by mouth daily.    spiritus frumenti (ETHYL ALCOHOL) SOLN Take 1 each by mouth daily. 1 Glass of wine daily    thiamine 100 MG tablet Take 1 tablet (100 mg total) by mouth daily.        ALLERGIES:   Allergies  Allergen Reactions  . Codeine Phosphate Nausea Only    BRIEF HPI:  See H&P, Labs, Consult and Test reports for all details in brief, patient is a 69 year old female with history of alcohol abuse, hypertension, dyslipidemia who apparently fell sometime in early May, and has been nonambulatory since then. She was brought to the hospital by her son, x-ray of the hip showed a right femoral fracture.  CONSULTATIONS:   orthopedic surgery  PERTINENT RADIOLOGIC STUDIES: Dg Chest 1 View  03/13/2015   CLINICAL DATA:  Multiple falls with increasing weakness and incontinence. Hip fracture. Initial encounter.  EXAM: CHEST  1 VIEW  COMPARISON:  04/11/2012 radiographs.  FINDINGS: 1744 hour. The heart size and mediastinal contours are stable. There are calcified right hilar and right pulmonary granulomas. The lungs are otherwise clear. There is no pleural effusion or pneumothorax. Old left-sided rib fractures noted. No acute osseous findings evident.  IMPRESSION: No acute chest findings or evidence of active cardiopulmonary process. Prior granulomatous disease and old left-sided rib fractures noted.  Electronically Signed   By: Richardean Sale M.D.   On: 03/13/2015 17:57   Dg Pelvis 1-2 Views  03/13/2015   CLINICAL DATA:  Increasing weakness over past month, incontinence, falls, ethanol abuse, fatigue, past history melanoma, hypertension  EXAM: PELVIS - 1-2 VIEW  COMPARISON:  None  FINDINGS: Severely rotated to the RIGHT.  Diffuse osseous demineralization.  Displaced RIGHT femoral neck fracture with overriding.  No  dislocation.  LEFT femur appears foreshortened due to obliquity without gross fracture.  Visualize bony pelvis intact.  IMPRESSION: Displaced overriding RIGHT femoral neck fracture.   Electronically Signed   By: Lavonia Dana M.D.   On: 03/13/2015 17:00   Dg Knee 1-2 Views Left  03/13/2015   CLINICAL DATA:  Intermittent pain.  Chronic flexion deformity  EXAM: LEFT KNEE - 1-2 VIEW  COMPARISON:  None.  FINDINGS: Frontal and lateral views obtained. The knee joint is flexed with patient reportedly unable to straighten knee. No frank dislocation. No fracture or joint effusion. There is mild generalized joint space narrowing.  IMPRESSION: No fracture or effusion. Knee flexed with patient unable to extend. No dislocations seen. Mild generalized joint space narrowing without erosion or bony destruction apparent.   Electronically Signed   By: Lowella Grip III M.D.   On: 03/13/2015 16:55   Dg Knee 1-2 Views Right  03/13/2015   CLINICAL DATA:  Right knee pain  EXAM: RIGHT KNEE - 1-2 VIEW  COMPARISON:  None.  FINDINGS: Limited evaluation secondary to flex knee in the AP view. The patient is unable to extend. No acute fracture or dislocation. No significant joint effusion. Normal soft tissues. Mild tricompartmental osteoarthritis.  IMPRESSION: No acute osseous injury of the right knee, but evaluation is limited secondary to a flexed knee in the AP view.   Electronically Signed   By: Kathreen Devoid   On: 03/13/2015 17:00   Dg Hip Unilat With Pelvis 2-3 Views Right  03/13/2015   CLINICAL DATA:  Hip pain. History of recent falls. Initial encounter.  EXAM: RIGHT HIP (WITH PELVIS) 2-3 VIEWS  COMPARISON:  None.  FINDINGS: The bones are demineralized. There is a superiorly displaced acute fracture of the right femoral neck. The femoral head appears located. No evidence of pelvic fracture.  IMPRESSION: Superiorly displaced acute fracture of the right femoral neck. No associated dislocation.   Electronically Signed   By:  Richardean Sale M.D.   On: 03/13/2015 17:55     PERTINENT LAB RESULTS: CBC:  Recent Labs  03/16/15 0500  WBC 4.7  HGB 8.5*  HCT 25.5*  PLT 285   CMET CMP     Component Value Date/Time   NA 139 03/16/2015 0500   K 3.0* 03/16/2015 0500   CL 109 03/16/2015 0500   CO2 23 03/16/2015 0500   GLUCOSE 89 03/16/2015 0500   GLUCOSE 106* 07/14/2006 0803   BUN 14 03/16/2015 0500   CREATININE 0.71 03/16/2015 0500   CALCIUM 8.3* 03/16/2015 0500   PROT 5.8* 03/15/2015 0500   ALBUMIN 2.3* 03/15/2015 0500   AST 19 03/15/2015 0500   ALT 11* 03/15/2015 0500   ALKPHOS 95 03/15/2015 0500   BILITOT 0.3 03/15/2015 0500   GFRNONAA >60 03/16/2015 0500   GFRAA >60 03/16/2015 0500    GFR CrCl cannot be calculated (Unknown ideal weight.). No results for input(s): LIPASE, AMYLASE in the last 72 hours. No results for input(s): CKTOTAL, CKMB, CKMBINDEX, TROPONINI in the last 72 hours. Invalid input(s): POCBNP No results for input(s): DDIMER  in the last 72 hours. No results for input(s): HGBA1C in the last 72 hours. No results for input(s): CHOL, HDL, LDLCALC, TRIG, CHOLHDL, LDLDIRECT in the last 72 hours. No results for input(s): TSH, T4TOTAL, T3FREE, THYROIDAB in the last 72 hours.  Invalid input(s): FREET3 No results for input(s): VITAMINB12, FOLATE, FERRITIN, TIBC, IRON, RETICCTPCT in the last 72 hours. Coags: No results for input(s): INR in the last 72 hours.  Invalid input(s): PT Microbiology: Recent Results (from the past 240 hour(s))  Urine culture     Status: None   Collection Time: 03/13/15  5:47 PM  Result Value Ref Range Status   Specimen Description Urine  Final   Special Requests NONE  Final   Culture   Final    MULTIPLE SPECIES PRESENT, SUGGEST RECOLLECTION IF CLINICALLY INDICATED Performed at [facility]    Report Status 03/15/2015 FINAL  Final     BRIEF HOSPITAL COURSE:  Right Hip Fracture: sustained a mechanical fall-sometime in May!Unable to walk since  the fall. Suspect sustained a hip fracture a month ago. Seems to have developed contracture of b/l lower ext (at level on knees/hips)-note this was present prior to admission.Orthopedics does not recommend surgery.Per orthopedics patient may transfer bed to chair  UTI: Will complete 3 doses of IV Rocephin and then discontinue on 6/20, poor historian-hard to tell if she has symptoms. Urine culture shows multiple bacterial morphotypes   Hypokalemia:repleted. Please recheck electrolytes in one week  ETOH abuse:last drink 1 day prior to admission. Minimally confused, but awake/alert and non tremulous. Reviewed outpatient notes from PCP, patient apparently has some mild dementia at baseline-suspect she is not very 5, usual baseline. Patient was managed with Ativan per CIWA protocol. She was also prescribed one glass of wine for comfort while she is in the hospital.   HTN: Noncompliant with antihypertensives medication for the past 1 month, BP initially was soft, however now persistently high-will start amlodipine on discharge. Please titrate accordingly.    ?Hx of Dementia: reviewed outpatient PCP note (Feb 2016)-?ETOH related dementia/chronic encephalopathy. Follow-but seems awake and alert today-suspect not 5, usual baseline  Stage 3 Sacral Pressure ulcer:present prior to admission. Wound care eval completed while in the hospital, Foley catheter was placed in anticipation of hip repair. This M.D. spoke with patient's son Delfino Lovett over the phone-he requests that Foley catheter be continued on discharge so that sacral decubitus ulcer will heal. He understands the risks of life-threatening/life disabling UTI with sepsis. At the request of family, Foley catheter being continued on discharge. Please consider removing Foley catheter in the next 2-3 weeks if one healing well. Please see below for wound care instructions Wound care to Stage 3 Pressure Injury at sacrum:  Cleanse with NS, pat gently dry.  Cover  open area with a small piece of calcium alginate dressing Kellie Simmering [phone]), top with a gauze 2x2 and cover with a soft silicone foam dressing for the sacrum Kellie Simmering [phone]).  Change calcium alginate daily and silicone dressing twice weekly or as needed.  Partial thickness wound on right lower extremity and skin tear on the right upper extremity: These were present on admission. Please see below for wound care instructions.      Wound care to right UE skin tear and RLE partial thickness wound: cleanse with NS, pat dry.  Cover with    vaseline white petrolatum gauze, top with gauze 4x4 and secure with Kerlix wrap/tape.  Non-severe (moderate) malnutrition in context of social or environmental circumstances:continue supplements  TODAY-DAY OF DISCHARGE:  Subjective:   Mikal Wisman today has no headache,no chest abdominal pain,no new weakness  Objective:   Blood pressure 155/84, pulse 95, temperature 99.4 F (37.4 C), temperature source Oral, resp. rate 20, SpO2 98 %.  Intake/Output Summary (Last 24 hours) at 03/17/15 1007 Last data filed at 03/17/15 0846  Gross per 24 hour  Intake 1313.15 ml  Output   1075 ml  Net 238.15 ml   There were no vitals filed for this visit.  Exam Awake Alert, Oriented *3, No new F.N deficits, Normal affect [place].AT,PERRAL Supple Neck,No JVD, No cervical lymphadenopathy appriciated.  Symmetrical Chest wall movement, Good air movement bilaterally, CTAB RRR,No Gallops,Rubs or new Murmurs, No Parasternal Heave +ve B.Sounds, Abd Soft, Non tender, No organomegaly appriciated, No rebound -guarding or rigidity. No Cyanosis, Clubbing or edema, No new Rash or bruise  DISCHARGE CONDITION: Stable  DISPOSITION: SNF  DISCHARGE INSTRUCTIONS:    Activity:  As tolerated  Okay to transfer from bed to chair.  Diet recommendation: Heart Healthy diet  Discharge Instructions    Call MD for:  persistant nausea and vomiting    Complete by:  As directed      Call MD  for:  redness, tenderness, or signs of infection (pain, swelling, redness, odor or green/yellow discharge around incision site)    Complete by:  As directed      Call MD for:  severe uncontrolled pain    Complete by:  As directed      Diet - low sodium heart healthy    Complete by:  As directed      Increase activity slowly    Complete by:  As directed            Follow-up Information    Follow up with [name], MD In 2 weeks.   Specialties:  Internal Medicine, Radiology   Contact information:   [place] [address] [phone]      Total Time spent on discharge equals  45 minutes.  SignedOren Binet 03/17/2015 10:07 AM

## 2015-03-18 NOTE — Progress Notes (Signed)
Clinical Social Work  Sports administrator # received and MD cleared patient for DC. CSW spoke with [facility] who is agreeable to accept patient today. CSW faxed DC summary and PASARR # to SNF who is agreeable to accept after 12:30pm today.  CSW spoke with son Delfino Lovett) who is pleased that patient can go to [facility] since her husband is also placed there. Son reports he prefers [facility] for transportation because he does not feel comfortable transporting patient. Son reports that he will visit patient this afternoon and thanked CSW for assistance.  CSW prepared DC packet with FL2, hard scripts, and DC summary included. RN to call report.  [facility] arranged for pick up. [facility] request #: [id number].  CSW is signing off but available if needed.  [place], [place] [phone]

## 2015-03-18 NOTE — Progress Notes (Signed)
Patient seen and examined. Vitals signs stable. Has SNF bed today-ok for discharge.

## 2015-03-18 NOTE — Clinical Social Work Placement (Signed)
   CLINICAL SOCIAL WORK PLACEMENT  NOTE  Date:  03/18/2015  Patient Details  Name: Tara Shaffer MRN: [id number] Date of Birth: 1945-10-22  Clinical Social Work is seeking post-discharge placement for this patient at the [place] level of care (*CSW will initial, date and re-position this form in  chart as items are completed):  No   Patient/family provided with [facility] Work Department's list of facilities offering this level of care within the geographic area requested by the patient (or if unable, by the patient's family).  Yes   Patient/family informed of their freedom to choose among providers that offer the needed level of care, that participate in Medicare, Medicaid or managed care program needed by the patient, have an available bed and are willing to accept the patient.  Yes   Patient/family informed of [facility]'s ownership interest in [facility] and [facility], as well as of the fact that they are under no obligation to receive care at these facilities.  PASRR submitted to EDS on 03/14/15     PASRR number received on 03/18/15     Existing PASRR number confirmed on       FL2 transmitted to all facilities in geographic area requested by pt/family on 03/14/15     FL2 transmitted to all facilities within larger geographic area on       Patient informed that his/her managed care company has contracts with or will negotiate with certain facilities, including the following:        Yes   Patient/family informed of bed offers received.  Patient chooses bed at [facility]     Physician recommends and patient chooses bed at      Patient to be transferred to [facility] on 03/18/15.  Patient to be transferred to facility by [facility]     Patient family notified on 03/18/15 of transfer.  Name of family member notified:  Richard-son     PHYSICIAN       Additional Comment:     _______________________________________________ [place] Master, [place] 03/18/2015, 10:43 AM

## 2015-03-18 NOTE — Progress Notes (Signed)
Pt discharged to [facility]...transported via [place].  All paperwork sent.

## 2015-03-19 ENCOUNTER — Encounter: Payer: Self-pay | Admitting: Internal Medicine

## 2015-03-19 ENCOUNTER — Non-Acute Institutional Stay (SKILLED_NURSING_FACILITY): Payer: Medicare Other | Admitting: Internal Medicine

## 2015-03-19 ENCOUNTER — Telehealth: Payer: Self-pay

## 2015-03-19 DIAGNOSIS — S72001D Fracture of unspecified part of neck of right femur, subsequent encounter for closed fracture with routine healing: Secondary | ICD-10-CM

## 2015-03-19 DIAGNOSIS — I10 Essential (primary) hypertension: Secondary | ICD-10-CM | POA: Diagnosis not present

## 2015-03-19 DIAGNOSIS — M7989 Other specified soft tissue disorders: Secondary | ICD-10-CM

## 2015-03-19 DIAGNOSIS — F1010 Alcohol abuse, uncomplicated: Secondary | ICD-10-CM | POA: Diagnosis not present

## 2015-03-19 DIAGNOSIS — E440 Moderate protein-calorie malnutrition: Secondary | ICD-10-CM | POA: Diagnosis not present

## 2015-03-19 DIAGNOSIS — L8990 Pressure ulcer of unspecified site, unspecified stage: Secondary | ICD-10-CM | POA: Diagnosis not present

## 2015-03-19 DIAGNOSIS — F0390 Unspecified dementia without behavioral disturbance: Secondary | ICD-10-CM

## 2015-03-19 DIAGNOSIS — N390 Urinary tract infection, site not specified: Secondary | ICD-10-CM

## 2015-03-19 NOTE — Telephone Encounter (Signed)
TCM list: Pt dc'ed to skilled nursing facility

## 2015-03-19 NOTE — Progress Notes (Signed)
MRN: [id number] Name: Tara Shaffer  Sex: female Age: 69 y.o. DOB: December 10, 1945  [place] #: Helene Kelp Facility/Room: Level Of Care: SNF Provider: Inocencio Homes D Emergency Contacts: Extended Emergency Contact Information Primary Emergency Contact: Milhorn,Richard Address: [address]          [place] of [place] Phone: [phone] Relation: Son  Code Status:   Allergies: Codeine phosphate  Chief Complaint  Patient presents with  . New Admit To SNF    HPI: Patient is 69 y.o. female who is an alcoholic, dx with hip fx on R, admitted to SNF for generalized weakness.  Past Medical History  Diagnosis Date  . MELANOMA, HX OF 08/04/2007  . ALLERGIC RHINITIS 06/23/2007  . ANXIETY DEPRESSION 06/12/2009  . ANXIETY 06/23/2007  . 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 GERD 06/23/2007  . HYPERLIPIDEMIA 06/24/2007  . HYPERTENSION 06/23/2007  . COLONIC POLYPS, HX OF 03/08/2002  . DIVERTICULOSIS, COLON 08/04/2007  . Irritable bowel syndrome 06/23/2007  . Memory dysfunction 06/23/2011  . Cancer     skin cx/ hx melanoma  . LOW BACK PAIN 06/24/2007  . Alcohol abuse 02/27/2012    Past Surgical History  Procedure Laterality Date  . Cesarean section      x 2  . Colonoscopy    . Appendectomy        Medication List       This list is accurate as of: 03/19/15 11:59 PM.  Always use your most recent med list.               amLODipine 5 MG tablet  Commonly known as:  NORVASC  Take 1 tablet (5 mg total) by mouth daily.     feeding supplement (ENSURE ENLIVE) Liqd  Take 237 mLs by mouth 2 (two) times daily between meals.     feeding supplement (PRO-STAT 64) Liqd  Take 30 mLs by mouth 2 (two) times daily between meals.     ferrous sulfate 325 (65 FE) MG tablet  Take 1 tablet (325 mg total) by mouth 3 (three) times daily after meals.     folic acid 1 MG tablet  Commonly known as:  FOLVITE  Take 1 tablet (1 mg total) by mouth daily.      HYDROcodone-acetaminophen 5-325 MG per tablet  Commonly known as:  NORCO/VICODIN  Take 1-2 tablets by mouth every 6 (six) hours as needed for moderate pain.     multivitamin with minerals Tabs tablet  Take 1 tablet by mouth daily.     spiritus frumenti Soln  Commonly known as:  ethyl alcohol  Take 1 each by mouth daily. 1 Glass of wine daily     thiamine 100 MG tablet  Take 1 tablet (100 mg total) by mouth daily.        No orders of the defined types were placed in this encounter.    Immunization History  Administered Date(s) Administered  . Pneumococcal Conjugate-13 07/31/2014  . Td 09/28/1995, 08/20/2008  . Tdap 07/18/2014  . Zoster 01/16/2008    History  Substance Use Topics  . Smoking status: Never Smoker   . Smokeless tobacco: Never Used  . Alcohol Use: 25.2 oz/week    42 Glasses of wine per week    Family history is noncontributory    Review of Systems  DATA OBTAINED: from nurse GENERAL:  no fevers, fatigue, appetite changes SKIN: No itching, rash ; sacral wound dressed EYES: No eye pain, redness, discharge EARS: No  earache, tinnitus, change in hearing NOSE: No congestion, drainage or bleeding  MOUTH/THROAT: No mouth or tooth pain, No sore throat RESPIRATORY: No cough, wheezing, SOB CARDIAC: No chest pain, palpitations, lower extremity edema  GI: No abdominal pain, No N/V/D or constipation, No heartburn or reflux  GU: No dysuria, frequency or urgency, or incontinence  MUSCULOSKELETAL: No unrelieved bone/joint pain NEUROLOGIC: No headache, dizziness or focal weakness PSYCHIATRIC: No overt anxiety , No behavior issue.   Filed Vitals:   03/19/15 1628  BP: 120/70  Pulse: 90  Temp: 98 F (36.7 C)  Resp: 18    Physical Exam  GENERAL APPEARANCE: Alert,min  conversant,  No acute distress.  SKIN: No diaphoresis rash;sacral decub, dressed HEAD: Normocephalic, atraumatic  EYES: Conjunctiva/lids clear. Pupils round, reactive. EOMs intact.  EARS:  External exam WNL, canals clear. Hearing grossly normal.  NOSE: No deformity or discharge.  MOUTH/THROAT: Lips w/o lesions  RESPIRATORY: Breathing is even, unlabored. Lung sounds are clear   CARDIOVASCULAR: Heart RRR no murmurs, rubs or gallops.;RLE twice the size of left  GASTROINTESTINAL: Abdomen is soft, non-tender, not distended w/ normal bowel sounds. GENITOURINARY: Bladder non tender, not distended; foley  MUSCULOSKELETAL: No abnormal joints or musculature NEUROLOGIC:  Cranial nerves 2-12 grossly intact. Moves all extremities  PSYCHIATRIC: odd, dementia, no behavioral issues  Patient Active Problem List   Diagnosis Date Noted  . DVT (deep venous thrombosis) 03/20/2015  . Pressure ulcer 03/14/2015  . Malnutrition of moderate degree 03/14/2015  . Closed right hip fracture 03/13/2015  . UTI (lower urinary tract infection) 03/13/2015  . Hyperlipidemia 03/13/2015  . Recurrent falls 10/31/2014  . Impaired glucose tolerance 10/31/2014  . Scalp laceration 08/04/2014  . Posterior pain of right hip 12/19/2012  . Urinary incontinence 04/23/2012  . 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 Facial fracture 04/10/2012  . Spinal fracture 04/10/2012  . Abdominal pain, other specified site 03/16/2012  . Alcohol abuse 02/27/2012  . Insomnia 09/17/2011  . Dementia 06/23/2011  . Left shoulder pain 02/09/2011  . Preventative health care 02/01/2011  . ANXIETY DEPRESSION 06/12/2009  . DIZZINESS 06/12/2009  . ELECTROCARDIOGRAM, ABNORMAL 08/20/2008  . 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 DIVERTICULOSIS, COLON 08/04/2007  . MELANOMA, HX OF 08/04/2007  . HYPERLIPIDEMIA 06/24/2007  . LOW BACK PAIN 06/24/2007  . Morbid obesity 06/23/2007  . ANXIETY 06/23/2007  . Essential hypertension 06/23/2007  . ALLERGIC RHINITIS 06/23/2007  . GERD 06/23/2007  . Irritable bowel syndrome 06/23/2007  . POSTMENOPAUSAL STATUS 06/23/2007  . COLONIC POLYPS, HX OF  03/08/2002    CBC    Component Value Date/Time   WBC 4.7 03/16/2015 0500   RBC 2.68* 03/16/2015 0500   HGB 8.5* 03/16/2015 0500   HCT 25.5* 03/16/2015 0500   PLT 285 03/16/2015 0500   MCV 95.1 03/16/2015 0500   LYMPHSABS 1.3 03/13/2015 1544   MONOABS 0.9 03/13/2015 1544   EOSABS 0.2 03/13/2015 1544   BASOSABS 0.0 03/13/2015 1544    CMP     Component Value Date/Time   NA 139 03/16/2015 0500   K 3.0* 03/16/2015 0500   CL 109 03/16/2015 0500   CO2 23 03/16/2015 0500   GLUCOSE 89 03/16/2015 0500   GLUCOSE 106* 07/14/2006 0803   BUN 14 03/16/2015 0500   CREATININE 0.71 03/16/2015 0500   CALCIUM 8.3* 03/16/2015 0500   PROT 5.8* 03/15/2015 0500   ALBUMIN 2.3* 03/15/2015 0500   AST 19 03/15/2015 0500   ALT 11* 03/15/2015 0500   ALKPHOS  95 03/15/2015 0500   BILITOT 0.3 03/15/2015 0500   GFRNONAA >60 03/16/2015 0500   GFRAA >60 03/16/2015 0500    Assessment and Plan  Closed right hip fracture : sustained a mechanical fall-sometime in May!Unable to walk since the fall. Suspect sustained a hip fracture a month ago. Seems to have developed contracture of b/l lower ext (at level on knees/hips)-note this was present prior to admission.Orthopedics does not recommend surgery.Per orthopedics patient may transfer bed to chair   UTI (lower urinary tract infection) Will complete 3 doses of IV Rocephin and then discontinue on 6/20, poor historian-hard to tell if she has symptoms. Urine culture shows multiple bacterial morphotypes   Alcohol abuse last drink 1 day prior to admission. Minimally confused, but awake/alert and non tremulous. Reviewed outpatient notes from PCP, patient apparently has some mild dementia at baseline-suspect she is not very 5, usual baseline. Patient was managed with Ativan per CIWA protocol. She was also prescribed one glass of wine for comfort while she is in the hospital.  Pt will not be getting her wine at SNF   Essential hypertension : Noncompliant with  antihypertensives medication for the past 1 month, BP initially was soft, however now persistently high-will start amlodipine on discharge. Please titrate accordingly  Dementia reviewed outpatient PCP note (Feb 2016)-?ETOH related dementia/chronic encephalopathy. Follow-but seems awake and alert today-suspect not 5, usual baseline  Pressure ulcer :present prior to admission. Wound care eval completed while in the hospital, Foley catheter was placed in anticipation of hip repair. This M.D. spoke with patient's son Delfino Lovett over the phone-he requests that Foley catheter be continued on discharge so that sacral decubitus ulcer will heal. He understands the risks of life-threatening/life disabling UTI with sepsis. At the request of family, Foley catheter being continued on discharge. Please consider removing Foley catheter in the next 2-3 weeks if one healing well. Please see below for wound care instructions Wound care to Stage 3 Pressure Injury at sacrum: Cleanse with NS, pat gently dry. Cover open area with a small piece of calcium alginate dressing Kellie Simmering [phone]), top with a gauze 2x2 and cover with a soft silicone foam dressing for the sacrum Kellie Simmering [phone]). Change calcium alginate daily and silicone dressing twice weekly or as needed.   Malnutrition of moderate degree Diet with supplements    Hennie Duos, MD

## 2015-03-20 ENCOUNTER — Encounter: Payer: Self-pay | Admitting: Internal Medicine

## 2015-03-20 ENCOUNTER — Non-Acute Institutional Stay (SKILLED_NURSING_FACILITY): Payer: Medicare Other | Admitting: Internal Medicine

## 2015-03-20 DIAGNOSIS — D649 Anemia, unspecified: Secondary | ICD-10-CM | POA: Diagnosis not present

## 2015-03-20 DIAGNOSIS — I82401 Acute embolism and thrombosis of unspecified deep veins of right lower extremity: Secondary | ICD-10-CM | POA: Diagnosis not present

## 2015-03-20 DIAGNOSIS — I82519 Chronic embolism and thrombosis of unspecified femoral vein: Secondary | ICD-10-CM | POA: Insufficient documentation

## 2015-03-20 DIAGNOSIS — E876 Hypokalemia: Secondary | ICD-10-CM

## 2015-03-20 NOTE — Progress Notes (Signed)
Patient ID: Tara Shaffer, female   DOB: 29-Nov-1945, 69 y.o.   MRN: [id number] MRN: [id number] Name: Tara Shaffer  Sex: female Age: 69 y.o. DOB: September 17, 1946  [place] #:  Facility/Room: Level Of Care: SNF Provider: Wille Celeste Emergency Contacts: Extended Emergency Contact Information Primary Emergency Contact: Grigoryan,Richard Address: [address]          [place] [phone] [place] Litter of [place] Phone: [phone] Relation: Son  Code Status:   Allergies: Codeine phosphate  Chief Complaint  Patient presents with  . Acute Visit   secondary to right leg DVT  HPI: Patient is 69 y.o. female who was recently hospitalized for a right femoral neck fracture-apparently patient fell last month and was not able to ambulate-eventually she was sent to the hospital where they diagnosed the hip fracture orthopedics was consulted and opted for conservative therapy with no surgical intervention.  She did developed appear some bilateral lower extremity contractures.  She was seen by Dr. Sheppard Coil yesterday who was concerned about some increased edema to her right lower extremity-a venous Doppler was ordered which is positive for a DVT with noncompressibility of the common femoral vein to the mid superficial femoral vein  On-call provider did start her on Eliquest t 10 mg twice a day for a week and then down to 5 mg twice a day.  Per nursing staff she continues to be stable vital signs have been stable she does not complain of increased pain or shortness of breath-she is  somewhat poor historian secondary to dementia  Past Medical History  Diagnosis Date  . MELANOMA, HX OF 08/04/2007  . ALLERGIC RHINITIS 06/23/2007  . ANXIETY DEPRESSION 06/12/2009  . ANXIETY 06/23/2007  . 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 GERD 06/23/2007  . HYPERLIPIDEMIA 06/24/2007  . HYPERTENSION 06/23/2007  . COLONIC POLYPS, HX OF 03/08/2002  . DIVERTICULOSIS, COLON 08/04/2007  .  Irritable bowel syndrome 06/23/2007  . Memory dysfunction 06/23/2011  . Cancer     skin cx/ hx melanoma  . LOW BACK PAIN 06/24/2007  . Alcohol abuse 02/27/2012    Past Surgical History  Procedure Laterality Date  . Cesarean section      x 2  . Colonoscopy    . Appendectomy        Medication List       This list is accurate as of: 03/20/15 12:28 PM.  Always use your most recent med list.               amLODipine 5 MG tablet  Commonly known as:  NORVASC  Take 1 tablet (5 mg total) by mouth daily.     ELIQUIS 5 MG Tabs tablet  Generic drug:  apixaban  Take 10 mg by mouth 2 (two) times daily. 10 mg twice a day for 7 days then reduce to 5 mg twice a day on July 30-2016     feeding supplement (ENSURE ENLIVE) Liqd  Take 237 mLs by mouth 2 (two) times daily between meals.     feeding supplement (PRO-STAT 64) Liqd  Take 30 mLs by mouth 2 (two) times daily between meals.     ferrous sulfate 325 (65 FE) MG tablet  Take 1 tablet (325 mg total) by mouth 3 (three) times daily after meals.     folic acid 1 MG tablet  Commonly known as:  FOLVITE  Take 1 tablet (1 mg total) by mouth daily.     HYDROcodone-acetaminophen 5-325 MG per tablet  Commonly known as:  NORCO/VICODIN  Take 1-2 tablets by mouth every 6 (six) hours as needed for moderate pain.     multivitamin with minerals Tabs tablet  Take 1 tablet by mouth daily.     spiritus frumenti Soln  Commonly known as:  ethyl alcohol  Take 1 each by mouth daily. 1 Glass of wine daily     thiamine 100 MG tablet  Take 1 tablet (100 mg total) by mouth daily.        Meds ordered this encounter  Medications  . apixaban (ELIQUIS) 5 MG TABS tablet    Sig: Take 10 mg by mouth 2 (two) times daily. 10 mg twice a day for 7 days then reduce to 5 mg twice a day on July 30-2016    Immunization History  Administered Date(s) Administered  . Pneumococcal Conjugate-13 07/31/2014  . Td 09/28/1995, 08/20/2008  . Tdap 07/18/2014  . Zoster  01/16/2008    History  Substance Use Topics  . Smoking status: Never Smoker   . Smokeless tobacco: Never Used  . Alcohol Use: 25.2 oz/week    42 Glasses of wine per week    Family history is noncontributory    Review of Systems  DATA OBTAINED: from patient, nurse, medical record, GENERAL:  no fevers, fatigue, appetite changes SKIN: No itching, rash or wounds EYES: No eye pain, redness, discharge EARS: No earache, tinnitus, change in hearing NOSE: No congestion, drainage or bleeding  MOUTH/THROAT: No mouth or tooth pain, No sore throat RESPIRATORY: No cough, wheezing, SOB CARDIAC: No chest pain, palpitations, has lower extremity edemaon the right  GI: No abdominal pain, No N/V/D or constipation, No heartburn or reflux  GU: No dysuria, frequency or urgency, or incontinence  MUSCULOSKELETAL: No unrelieved bone/joint pain NEUROLOGIC: No headache, dizziness or focal weakness PSYCHIATRIC: No overt anxiety or sadness, No behavior issue.   Filed Vitals:   03/20/15 1204  BP: 122/60  Pulse: 84  Temp: 98 F (36.7 C)  Resp: 18    Physical Exam  GENERAL APPEARANCE: Alert, conversant,  No acute distress.  SKIN: No diaphoresis rash HEAD: Normocephalic, atraumatic  EYES: Conjunctiva/lids clear. Pupils round, reactive. EOMs intact.  EARS: External exam WNL, canals clear. Hearing grossly normal.  NOSE: No deformity or discharge.  MOUTH/THROAT: Oropharynx is clear mucous membranes moist  RESPIRATORY: Breathing is even, unlabored. Lung sounds are clear   CARDIOVASCULAR: Heart RRR no murmurs, rubs or gallops. 1-2 plus peripheral edema.on the right...difficult to palpate pedalpulse on the right   --edema is cool---,,,minmally tender   GASTROINTESTINAL: Abdomen is soft, non-tender, not distended w/ normal bowel sounds.   MUSCULOSKELETAL some contractures of lower extremities bilaterally NEUROLOGIC:  Cranial nerves 2-12 grossly intact. Moves all extremities--but limited lower extremity  secondary to contractures  PSYCHIATRIC: Oriented to self only will answer simple questions  Patient Active Problem List   Diagnosis Date Noted  . DVT (deep venous thrombosis) 03/20/2015  . Pressure ulcer 03/14/2015  . Malnutrition of moderate degree 03/14/2015  . Closed right hip fracture 03/13/2015  . UTI (lower urinary tract infection) 03/13/2015  . Hyperlipidemia 03/13/2015  . Recurrent falls 10/31/2014  . Impaired glucose tolerance 10/31/2014  . Scalp laceration 08/04/2014  . Posterior pain of right hip 12/19/2012  . Urinary incontinence 04/23/2012  . 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 Facial fracture 04/10/2012  . Spinal fracture 04/10/2012  . Abdominal pain, other specified site 03/16/2012  . Alcohol abuse 02/27/2012  . Insomnia 09/17/2011  .  Dementia 06/23/2011  . Left shoulder pain 02/09/2011  . Preventative health care 02/01/2011  . ANXIETY DEPRESSION 06/12/2009  . DIZZINESS 06/12/2009  . ELECTROCARDIOGRAM, ABNORMAL 08/20/2008  . 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 DIVERTICULOSIS, COLON 08/04/2007  . MELANOMA, HX OF 08/04/2007  . HYPERLIPIDEMIA 06/24/2007  . LOW BACK PAIN 06/24/2007  . Morbid obesity 06/23/2007  . ANXIETY 06/23/2007  . Essential hypertension 06/23/2007  . ALLERGIC RHINITIS 06/23/2007  . GERD 06/23/2007  . Irritable bowel syndrome 06/23/2007  . POSTMENOPAUSAL STATUS 06/23/2007  . COLONIC POLYPS, HX OF 03/08/2002    CBC    Component Value Date/Time   WBC 4.7 03/16/2015 0500   RBC 2.68* 03/16/2015 0500   HGB 8.5* 03/16/2015 0500   HCT 25.5* 03/16/2015 0500   PLT 285 03/16/2015 0500   MCV 95.1 03/16/2015 0500   LYMPHSABS 1.3 03/13/2015 1544   MONOABS 0.9 03/13/2015 1544   EOSABS 0.2 03/13/2015 1544   BASOSABS 0.0 03/13/2015 1544    CMP     Component Value Date/Time   NA 139 03/16/2015 0500   K 3.0* 03/16/2015 0500   CL 109 03/16/2015 0500   CO2 23 03/16/2015 0500   GLUCOSE 89  03/16/2015 0500   GLUCOSE 106* 07/14/2006 0803   BUN 14 03/16/2015 0500   CREATININE 0.71 03/16/2015 0500   CALCIUM 8.3* 03/16/2015 0500   PROT 5.8* 03/15/2015 0500   ALBUMIN 2.3* 03/15/2015 0500   AST 19 03/15/2015 0500   ALT 11* 03/15/2015 0500   ALKPHOS 95 03/15/2015 0500   BILITOT 0.3 03/15/2015 0500   GFRNONAA >60 03/16/2015 0500   GFRAA >60 03/16/2015 0500    Assessment and Plan  #1-right leg DVT-this is a new diagnosis-clinically she appears to be stable she has been started on Eliquist-continue to monitor vital signs pulse ox every shift---bed rest for now  To comlete 24 hoursfrom initiation of anticoagulation.  #2-what appears to be some history of hypokalemia in the hospital-apparently this was supplemented she will need an updated metabolic panel tomorrow.  #3 anemia appear she had variable hemoglobins in the hospital ranging from the nines to up to 16-will update a CBC tomorrow.  [id number]  Soleia Badolato C,

## 2015-03-21 DIAGNOSIS — M7989 Other specified soft tissue disorders: Secondary | ICD-10-CM | POA: Insufficient documentation

## 2015-03-21 NOTE — Assessment & Plan Note (Signed)
last drink 1 day prior to admission. Minimally confused, but awake/alert and non tremulous. Reviewed outpatient notes from PCP, patient apparently has some mild dementia at baseline-suspect she is not very 5, usual baseline. Patient was managed with Ativan per CIWA protocol. She was also prescribed one glass of wine for comfort while she is in the hospital.  Pt will not be getting her wine at [facility]

## 2015-03-21 NOTE — Assessment & Plan Note (Signed)
reviewed outpatient PCP note (Feb 2016)-?ETOH related dementia/chronic encephalopathy. Follow-but seems awake and alert today-suspect not 5, usual baseline

## 2015-03-21 NOTE — Assessment & Plan Note (Signed)
Diet with supplements

## 2015-03-21 NOTE — Assessment & Plan Note (Signed)
Will complete 3 doses of IV Rocephin and then discontinue on 6/20, poor historian-hard to tell if she has symptoms. Urine culture shows multiple bacterial morphotypes

## 2015-03-21 NOTE — Assessment & Plan Note (Signed)
:  present prior to admission. Wound care eval completed while in the hospital, Foley catheter was placed in anticipation of hip repair. This M.D. spoke with patient's son Delfino Lovett over the phone-he requests that Foley catheter be continued on discharge so that sacral decubitus ulcer will heal. He understands the risks of life-threatening/life disabling UTI with sepsis. At the request of family, Foley catheter being continued on discharge. Please consider removing Foley catheter in the next 2-3 weeks if one healing well. Please see below for wound care instructions Wound care to Stage 3 Pressure Injury at sacrum: Cleanse with NS, pat gently dry. Cover open area with a small piece of calcium alginate dressing Kellie Simmering [phone]), top with a gauze 2x2 and cover with a soft silicone foam dressing for the sacrum Kellie Simmering [phone]). Change calcium alginate daily and silicone dressing twice weekly or as needed.

## 2015-03-21 NOTE — Assessment & Plan Note (Addendum)
Twice the size of L ;calf tense, fx of hip on R side- order doppler u/s to look for dvt

## 2015-03-21 NOTE — Assessment & Plan Note (Signed)
:   Noncompliant with antihypertensives medication for the past 1 month, BP initially was soft, however now persistently high-will start amlodipine on discharge. Please titrate accordingly

## 2015-03-21 NOTE — Assessment & Plan Note (Signed)
:   sustained a mechanical fall-sometime in May!Unable to walk since the fall. Suspect sustained a hip fracture a month ago. Seems to have developed contracture of b/l lower ext (at level on knees/hips)-note this was present prior to admission.Orthopedics does not recommend surgery.Per orthopedics patient may transfer bed to chair

## 2015-03-25 ENCOUNTER — Encounter: Payer: Self-pay | Admitting: Internal Medicine

## 2015-03-25 ENCOUNTER — Non-Acute Institutional Stay (SKILLED_NURSING_FACILITY): Payer: Medicare Other | Admitting: Internal Medicine

## 2015-03-25 DIAGNOSIS — F0391 Unspecified dementia with behavioral disturbance: Secondary | ICD-10-CM

## 2015-03-25 DIAGNOSIS — L981 Factitial dermatitis: Secondary | ICD-10-CM

## 2015-03-25 DIAGNOSIS — F03918 Unspecified dementia, unspecified severity, with other behavioral disturbance: Secondary | ICD-10-CM

## 2015-03-25 DIAGNOSIS — I82401 Acute embolism and thrombosis of unspecified deep veins of right lower extremity: Secondary | ICD-10-CM | POA: Diagnosis not present
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
## 2015-03-26 ENCOUNTER — Encounter: Payer: Self-pay | Admitting: Internal Medicine

## 2015-03-26 ENCOUNTER — Non-Acute Institutional Stay (SKILLED_NURSING_FACILITY): Payer: Medicare Other | Admitting: Internal Medicine

## 2015-03-26 DIAGNOSIS — S0101XA Laceration without foreign body of scalp, initial encounter: Secondary | ICD-10-CM | POA: Diagnosis not present

## 2015-03-26 DIAGNOSIS — L981 Factitial dermatitis: Secondary | ICD-10-CM | POA: Insufficient documentation

## 2015-03-26 NOTE — Assessment & Plan Note (Signed)
Wet and weepy, no signs infection; Left leg could have unaboot, R leg can not because pt has newly dx DVT; pt won't elevate and can't elevate 2/2 knee in frozen flexion BLE; will gauze wrap q shift; kacie, wound nurse said she would do it daily in am and at 5pm

## 2015-03-26 NOTE — Addendum Note (Signed)
Addended by: Inocencio Homes D on: 03/26/2015 08:06 PM   Modules accepted: Level of Service
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
## 2015-03-26 NOTE — Progress Notes (Signed)
MRN: [id number] Name: Tara Shaffer  Sex: female Age: 69 y.o. DOB: Feb 08, 1946  [place] #: [facility] Facility/Room: Level Of Care: SNF Provider: Inocencio Homes D Emergency Contacts: Extended Emergency Contact Information Primary Emergency Contact: Deman,Richard Address: [place]          [place] Johnnette Litter of [place] Phone: [phone] Relation: Son  Code Status:   Allergies: Codeine phosphate  Chief Complaint  Patient presents with  . Acute Visit    HPI: Patient is 69 y.o. female who is being seen for a fall and a wound on her scalp.  Past Medical History  Diagnosis Date  . MELANOMA, HX OF 08/04/2007  . ALLERGIC RHINITIS 06/23/2007  . ANXIETY DEPRESSION 06/12/2009  . ANXIETY 06/23/2007  . 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 GERD 06/23/2007  . HYPERLIPIDEMIA 06/24/2007  . HYPERTENSION 06/23/2007  . COLONIC POLYPS, HX OF 03/08/2002  . DIVERTICULOSIS, COLON 08/04/2007  . Irritable bowel syndrome 06/23/2007  . Memory dysfunction 06/23/2011  . Cancer     skin cx/ hx melanoma  . LOW BACK PAIN 06/24/2007  . Alcohol abuse 02/27/2012    Past Surgical History  Procedure Laterality Date  . Cesarean section      x 2  . Colonoscopy    . Appendectomy        Medication List       This list is accurate as of: 03/26/15 11:59 PM.  Always use your most recent med list.               amLODipine 5 MG tablet  Commonly known as:  NORVASC  Take 1 tablet (5 mg total) by mouth daily.     ELIQUIS 5 MG Tabs tablet  Generic drug:  apixaban  Take 10 mg by mouth 2 (two) times daily. 10 mg twice a day for 7 days then reduce to 5 mg twice a day on July 30-2016     feeding supplement (ENSURE ENLIVE) Liqd  Take 237 mLs by mouth 2 (two) times daily between meals.     feeding supplement (PRO-STAT 64) Liqd  Take 30 mLs by mouth 2 (two) times daily between meals.     ferrous sulfate 325 (65 FE) MG tablet  Take 1 tablet (325 mg total) by  mouth 3 (three) times daily after meals.     folic acid 1 MG tablet  Commonly known as:  FOLVITE  Take 1 tablet (1 mg total) by mouth daily.     HYDROcodone-acetaminophen 5-325 MG per tablet  Commonly known as:  NORCO/VICODIN  Take 1-2 tablets by mouth every 6 (six) hours as needed for moderate pain.     multivitamin with minerals Tabs tablet  Take 1 tablet by mouth daily.     spiritus frumenti Soln  Commonly known as:  ethyl alcohol  Take 1 each by mouth daily. 1 Glass of wine daily     thiamine 100 MG tablet  Take 1 tablet (100 mg total) by mouth daily.        No orders of the defined types were placed in this encounter.    Immunization History  Administered Date(s) Administered  . Pneumococcal Conjugate-13 07/31/2014  . Td 09/28/1995, 08/20/2008  . Tdap 07/18/2014  . Zoster 01/16/2008    History  Substance Use Topics  . Smoking status: Never Smoker   . Smokeless tobacco: Never Used  . Alcohol Use: 25.2 oz/week    42 Glasses of wine per week  Review of Systems  DATA OBTAINED: from patient, nurse GENERAL:  no fevers, fatigue, appetite changes SKIN: No itching, rash HEENT: No complaint RESPIRATORY: No cough, wheezing, SOB CARDIAC: No chest pain, palpitations, lower extremity edema  GI: No abdominal pain, No N/V/D or constipation, No heartburn or reflux  GU: No dysuria, frequency or urgency, or incontinence  MUSCULOSKELETAL: No unrelieved bone/joint pain NEUROLOGIC: No headache, dizziness; pt does have ataxia chronically  PSYCHIATRIC: no change  Filed Vitals:   03/26/15 2032  BP: 118/64  Pulse: 68  Temp: 98.1 F (36.7 C)  Resp: 17    Physical Exam  GENERAL APPEARANCE: Alert,  No acute distress  SKIN:L scalp at forehead with slightly deeper than surface abrasion HEENT: Unremarkable RESPIRATORY: Breathing is even, unlabored. Lung sounds are clear   CARDIOVASCULAR: Heart RRR no murmurs, rubs or gallops. No peripheral edema  GASTROINTESTINAL:  Abdomen is soft, non-tender, not distended w/ normal bowel sounds.  GENITOURINARY: Bladder non tender, not distended  MUSCULOSKELETAL: No abnormal joints or musculature NEUROLOGIC: Cranial nerves 2-12 grossly intact. Moves all extremities PSYCHIATRIC: no change in sensorium  Patient Active Problem List   Diagnosis Date Noted  . 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 Right leg swelling 03/21/2015  . DVT (deep venous thrombosis) 03/20/2015  . Pressure ulcer 03/14/2015  . Malnutrition of moderate degree 03/14/2015  . Closed right hip fracture 03/13/2015  . UTI (lower urinary tract infection) 03/13/2015  . Hyperlipidemia 03/13/2015  . Recurrent falls 10/31/2014  . Impaired glucose tolerance 10/31/2014  . Scalp laceration 08/04/2014  . Posterior pain of right hip 12/19/2012  . Urinary incontinence 04/23/2012  . 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 Facial fracture 04/10/2012  . Spinal fracture 04/10/2012  . Abdominal pain, other specified site 03/16/2012  . Alcohol abuse 02/27/2012  . Insomnia 09/17/2011  . Dementia with behavioral disturbance 06/23/2011  . Left shoulder pain 02/09/2011  . Preventative health care 02/01/2011  . ANXIETY DEPRESSION 06/12/2009  . DIZZINESS 06/12/2009  . ELECTROCARDIOGRAM, ABNORMAL 08/20/2008  . 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 DIVERTICULOSIS, COLON 08/04/2007  . MELANOMA, HX OF 08/04/2007  . HYPERLIPIDEMIA 06/24/2007  . LOW BACK PAIN 06/24/2007  . Morbid obesity 06/23/2007  . ANXIETY 06/23/2007  . Essential hypertension 06/23/2007  . ALLERGIC RHINITIS 06/23/2007  . GERD 06/23/2007  . Irritable bowel syndrome 06/23/2007  . POSTMENOPAUSAL STATUS 06/23/2007  . COLONIC POLYPS, HX OF 03/08/2002    CBC    Component Value Date/Time   WBC 4.7 03/16/2015 0500   RBC 2.68* 03/16/2015 0500   HGB 8.5* 03/16/2015 0500   HCT 25.5* 03/16/2015 0500   PLT 285 03/16/2015 0500   MCV 95.1 03/16/2015 0500    LYMPHSABS 1.3 03/13/2015 1544   MONOABS 0.9 03/13/2015 1544   EOSABS 0.2 03/13/2015 1544   BASOSABS 0.0 03/13/2015 1544    CMP     Component Value Date/Time   NA 139 03/16/2015 0500   K 3.0* 03/16/2015 0500   CL 109 03/16/2015 0500   CO2 23 03/16/2015 0500   GLUCOSE 89 03/16/2015 0500   GLUCOSE 106* 07/14/2006 0803   BUN 14 03/16/2015 0500   CREATININE 0.71 03/16/2015 0500   CALCIUM 8.3* 03/16/2015 0500   PROT 5.8* 03/15/2015 0500   ALBUMIN 2.3* 03/15/2015 0500   AST 19 03/15/2015 0500   ALT 11* 03/15/2015 0500   ALKPHOS 95 03/15/2015 0500   BILITOT 0.3 03/15/2015 0500   GFRNONAA >60 03/16/2015 0500   GFRAA >60 03/16/2015 0500  Assessment and Plan  Scalp laceration Occurred just now 2/2 witnessed fall. No LOC.the area to L scalp has an abrasion but no actual laceration that would require sutures. Pt's MS is same as always. Pt will be under neuro checks q 2 for 24 and prn.    Hennie Duos, MD

## 2015-03-26 NOTE — Assessment & Plan Note (Signed)
RLE with swelling much improved over last week even without leg elevation;pt being tx with eliquis

## 2015-03-31 ENCOUNTER — Encounter: Payer: Self-pay | Admitting: Internal Medicine

## 2015-03-31 NOTE — Assessment & Plan Note (Signed)
Occurred just now 2/2 witnessed fall. No LOC.the area to L scalp has an abrasion but no actual laceration that would require sutures. Pt's MS is same as always. Pt will be under neuro checks q 2 for 24 and prn.

## 2015-04-01 ENCOUNTER — Ambulatory Visit: Payer: 59 | Admitting: Internal Medicine

## 2015-04-01 DIAGNOSIS — Z0289 Encounter for other administrative examinations: Secondary | ICD-10-CM

## 2015-04-08 ENCOUNTER — Emergency Department (HOSPITAL_COMMUNITY): Payer: Medicare Other

## 2015-04-08 ENCOUNTER — Encounter (HOSPITAL_COMMUNITY): Payer: Self-pay | Admitting: Emergency Medicine

## 2015-04-08 ENCOUNTER — Emergency Department (HOSPITAL_COMMUNITY)
Admission: EM | Admit: 2015-04-08 | Discharge: 2015-04-08 | Disposition: A | Discharge status: Home / Self Care | Payer: Medicare Other | Attending: Emergency Medicine | Admitting: Emergency Medicine

## 2015-04-08 DIAGNOSIS — Y92128 Other place in nursing home as the place of occurrence of the external cause: Secondary | ICD-10-CM | POA: Insufficient documentation

## 2015-04-08 DIAGNOSIS — Z86010 Personal history of colonic polyps: Secondary | ICD-10-CM | POA: Diagnosis not present

## 2015-04-08 DIAGNOSIS — M25551 Pain in right hip: Secondary | ICD-10-CM | POA: Diagnosis present

## 2015-04-08 DIAGNOSIS — W19XXXA Unspecified fall, initial encounter: Secondary | ICD-10-CM

## 2015-04-08 DIAGNOSIS — S0101XA Laceration without foreign body of scalp, initial encounter: Secondary | ICD-10-CM | POA: Diagnosis not present

## 2015-04-08 DIAGNOSIS — S72001K Fracture of unspecified part of neck of right femur, subsequent encounter for closed fracture with nonunion: Secondary | ICD-10-CM | POA: Diagnosis not present

## 2015-04-08 DIAGNOSIS — F419 Anxiety disorder, unspecified: Secondary | ICD-10-CM | POA: Diagnosis not present

## 2015-04-08 DIAGNOSIS — Y9389 Activity, other specified: Secondary | ICD-10-CM | POA: Diagnosis not present

## 2015-04-08 DIAGNOSIS — R296 Repeated falls: Secondary | ICD-10-CM | POA: Insufficient documentation

## 2015-04-08 DIAGNOSIS — Z85820 Personal history of malignant melanoma of skin: Secondary | ICD-10-CM | POA: Diagnosis not present

## 2015-04-08 DIAGNOSIS — F329 Major depressive disorder, single episode, unspecified: Secondary | ICD-10-CM | POA: Diagnosis not present

## 2015-04-08 DIAGNOSIS — Z8639 Personal history of other endocrine, nutritional and metabolic disease: Secondary | ICD-10-CM | POA: Insufficient documentation

## 2015-04-08 DIAGNOSIS — Y998 Other external cause status: Secondary | ICD-10-CM | POA: Insufficient documentation

## 2015-04-08 DIAGNOSIS — Z79899 Other long term (current) drug therapy: Secondary | ICD-10-CM | POA: Diagnosis not present

## 2015-04-08 DIAGNOSIS — Z85828 Personal history of other malignant neoplasm of skin: Secondary | ICD-10-CM | POA: Diagnosis not present

## 2015-04-08 DIAGNOSIS — Z8719 Personal history of other diseases of the digestive system: Secondary | ICD-10-CM | POA: Diagnosis not present

## 2015-04-08 DIAGNOSIS — I10 Essential (primary) hypertension: Secondary | ICD-10-CM | POA: Insufficient documentation

## 2015-04-08 DIAGNOSIS — W1839XA Other fall on same level, initial encounter: Secondary | ICD-10-CM | POA: Insufficient documentation

## 2015-04-08 DIAGNOSIS — Z7901 Long term (current) use of anticoagulants: Secondary | ICD-10-CM | POA: Diagnosis not present

## 2015-04-08 NOTE — ED Provider Notes (Signed)
CSN: [id number]     Arrival date & time 04/08/15  1645 History   First MD Initiated Contact with Patient 04/08/15 1655     Chief Complaint  Patient presents with  . Fall     (Consider location/radiation/quality/duration/timing/severity/associated sxs/prior Treatment) HPI Comments: Patient presents to the emergency department from [facility] and rehabilitation center with chief complaint of fall. Patient states that she fell backward. She is staying at the rehabilitation center after having a right femur fracture. Reportedly, the rehabilitation center obtained. Films today, which showed a right small neck fracture. They sent the patient to the emergency department for further evaluation and care. Patient complains of mild to moderate pain in her hip. It is worsened with palpation and range of motion.  The history is provided by the patient. No language interpreter was used.    Past Medical History  Diagnosis Date  . MELANOMA, HX OF 08/04/2007  . ALLERGIC RHINITIS 06/23/2007  . ANXIETY DEPRESSION 06/12/2009  . ANXIETY 06/23/2007  . 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 GERD 06/23/2007  . HYPERLIPIDEMIA 06/24/2007  . HYPERTENSION 06/23/2007  . COLONIC POLYPS, HX OF 03/08/2002  . DIVERTICULOSIS, COLON 08/04/2007  . Irritable bowel syndrome 06/23/2007  . Memory dysfunction 06/23/2011  . Cancer     skin cx/ hx melanoma  . LOW BACK PAIN 06/24/2007  . Alcohol abuse 02/27/2012   Past Surgical History  Procedure Laterality Date  . Cesarean section      x 2  . Colonoscopy    . Appendectomy     Family History  Problem Relation Age of Onset  . Hypertension Other   . Diabetes Other   . Colon cancer Cousin    History  Substance Use Topics  . Smoking status: Never Smoker   . Smokeless tobacco: Never Used  . Alcohol Use: 25.2 oz/week    42 Glasses of wine per week   OB History    No data available     Review of Systems  Constitutional: Negative for fever and  chills.  Respiratory: Negative for shortness of breath.   Cardiovascular: Negative for chest pain.  Gastrointestinal: Negative for nausea, vomiting, diarrhea and constipation.  Genitourinary: Negative for dysuria.  Musculoskeletal: Positive for arthralgias.  All other systems reviewed and are negative.     Allergies  Codeine phosphate  Home Medications   Prior to Admission medications   Medication Sig Start Date End Date Taking? Authorizing Provider  Amino Acids-Protein Hydrolys (FEEDING SUPPLEMENT, PRO-STAT 64,) LIQD Take 30 mLs by mouth 2 (two) times daily between meals. 03/17/15   [name], MD  amLODipine (NORVASC) 5 MG tablet Take 1 tablet (5 mg total) by mouth daily. 03/17/15   [name], MD  apixaban (ELIQUIS) 5 MG TABS tablet Take 10 mg by mouth 2 (two) times daily. 10 mg twice a day for 7 days then reduce to 5 mg twice a day on July 30-2016    Historical Provider, MD  feeding supplement, ENSURE ENLIVE, (ENSURE ENLIVE) LIQD Take 237 mLs by mouth 2 (two) times daily between meals. 03/17/15   [name], MD  ferrous sulfate 325 (65 FE) MG tablet Take 1 tablet (325 mg total) by mouth 3 (three) times daily after meals. 03/17/15   [name], MD  folic acid (FOLVITE) 1 MG tablet Take 1 tablet (1 mg total) by mouth daily. 03/17/15   [name], MD  HYDROcodone-acetaminophen (NORCO/VICODIN) 5-325 MG per tablet Take 1-2 tablets  by mouth every 6 (six) hours as needed for moderate pain. 03/17/15   [name], MD  Multiple Vitamin (MULTIVITAMIN WITH MINERALS) TABS tablet Take 1 tablet by mouth daily. 03/17/15   [name], MD  spiritus frumenti (ETHYL ALCOHOL) SOLN Take 1 each by mouth daily. 1 Glass of wine daily 03/17/15   Jonetta Osgood, MD  thiamine 100 MG tablet Take 1 tablet (100 mg total) by mouth daily. 03/17/15   [name], MD   BP 141/68 mmHg  Pulse 88  Temp(Src) 98.8 F (37.1 C) (Oral)  Resp 18  SpO2 98% Physical Exam   Constitutional: She is oriented to person, place, and time. She appears well-developed and well-nourished.  HENT:  Head: Normocephalic and atraumatic.  Eyes: Conjunctivae and EOM are normal. Pupils are equal, round, and reactive to light.  Neck: Normal range of motion. Neck supple.  Cardiovascular: Normal rate, regular rhythm and intact distal pulses.  Exam reveals no gallop and no friction rub.   No murmur heard. Intact distal pulses  Pulmonary/Chest: Effort normal and breath sounds normal. No respiratory distress. She has no wheezes. She has no rales. She exhibits no tenderness.  Abdominal: Soft. Bowel sounds are normal. She exhibits no distension and no mass. There is no tenderness. There is no rebound and no guarding.  Musculoskeletal: Normal range of motion. She exhibits no edema or tenderness.  Range of motion strength of left hip 5/5, right hip range of motion strength limited secondary to pain, moderately tender to palpation, no obvious bony abnormality or deformity  Neurological: She is alert and oriented to person, place, and time.  Skin: Skin is warm and dry.  Psychiatric: She has a normal mood and affect. Her behavior is normal. Judgment and thought content normal.  Nursing note and vitals reviewed.   ED Course  Procedures (including critical care time) Labs Review Labs Reviewed - No data to display  Imaging Review No results found.   EKG Interpretation None      MDM   Final diagnoses:  Fall  Femoral neck fracture, right, closed, with nonunion, subsequent encounter    Patient with mechanical fall and right hip pain.  She also has a small laceration on the back of her head.  Will check head and c-spine CT.  Will check plain films. Will reassess..  Patient with persistent right small neck fracture. No new changes.  No indication for surgical repair at this time, patient is nonambulatory. Patient seen by and discussed with Dr. Betsey Holiday, who agrees to plan. Discharged  patient to her rehabilitation center.      Montine Circle, PA-C 04/08/15 [place], MD 04/08/15 1859

## 2015-04-08 NOTE — ED Provider Notes (Signed)
Patient presented to the ER with multiple falls. Patient has had multiple falls in rehabilitation, x-ray performed today concerning for possible right femoral neck fracture. Patient does report that she has fallen recently, but is not experiencing pain in the right leg.  Face to face Exam: HEENT - PERRLA Lungs - CTAB Heart - RRR, no M/R/G Abd - S/NT/ND Neuro - alert, oriented x3  Plan: Patient with pre-existing right femoral neck fracture, felt to be nonoperative based on her inability to ambulate previously. Comparison x-ray to see if there is no injury.  Orpah Greek, MD 04/08/15 1758

## 2015-04-08 NOTE — Discharge Instructions (Signed)

## 2015-04-08 NOTE — ED Notes (Signed)
Pt saturated in urine and feces upon arrival; pericare complete and new brief/chucks applied.

## 2015-04-08 NOTE — ED Notes (Signed)
Bed: [facility] Expected date:  Expected time:  Means of arrival:  Comments: FALL

## 2015-04-08 NOTE — ED Notes (Signed)
Per Stanton Kidney from facility pt sent to facility for right femur fracture in May; staff completed DG of right hip related to swelling/discoloration noted this morning showing possible acute right femoral neck fracture. Staff request another DG to confirm whether fracture is acute versus chronic. Stanton Kidney continues to reports fall at 1600 resulting in laceration to back of head. Provider aware.

## 2015-04-08 NOTE — ED Notes (Addendum)
Per EMS pt from [facility] with complaint of recent multiple falls; DG done at facility possible acute femoral neck fracture; no shortening or pain with palpation noted. Pedal pulses moderate. Pt presents with laceration to posterior head; denies pain; alert to self per norm.

## 2015-04-23 ENCOUNTER — Encounter: Payer: Self-pay | Admitting: Internal Medicine

## 2015-04-23 ENCOUNTER — Non-Acute Institutional Stay (SKILLED_NURSING_FACILITY): Payer: Medicare Other | Admitting: Internal Medicine

## 2015-04-23 DIAGNOSIS — F0391 Unspecified dementia with behavioral disturbance: Secondary | ICD-10-CM

## 2015-04-23 DIAGNOSIS — D62 Acute posthemorrhagic anemia: Secondary | ICD-10-CM | POA: Diagnosis not present

## 2015-04-23 DIAGNOSIS — F03918 Unspecified dementia, unspecified severity, with other behavioral disturbance: Secondary | ICD-10-CM

## 2015-04-23 DIAGNOSIS — I82401 Acute embolism and thrombosis of unspecified deep veins of right lower extremity: Secondary | ICD-10-CM

## 2015-04-23 NOTE — Progress Notes (Signed)
MRN: [id number] Name: Tara Shaffer  Sex: female Age: 69 y.o. DOB: 1946/09/04  [place] #: [facility] Facility/Room:203D Level Of Care: SNF Provider: Inocencio Homes D Emergency Contacts: Extended Emergency Contact Information Primary Emergency Contact: Laufer,Richard Address: [place]          [place] of [place] Phone: [phone] Relation: Son  Code Status: FULL  Allergies: Codeine phosphate  Chief Complaint  Patient presents with  . Medical Management of Chronic Issues    HPI: Patient is 69 y.o. female who is being seen for dementia with behavoirs,, anemia and ETOH abuse.  Past Medical History  Diagnosis Date  . MELANOMA, HX OF 08/04/2007  . ALLERGIC RHINITIS 06/23/2007  . ANXIETY DEPRESSION 06/12/2009  . ANXIETY 06/23/2007  . 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 GERD 06/23/2007  . HYPERLIPIDEMIA 06/24/2007  . HYPERTENSION 06/23/2007  . COLONIC POLYPS, HX OF 03/08/2002  . DIVERTICULOSIS, COLON 08/04/2007  . Irritable bowel syndrome 06/23/2007  . Memory dysfunction 06/23/2011  . Cancer     skin cx/ hx melanoma  . LOW BACK PAIN 06/24/2007  . Alcohol abuse 02/27/2012    Past Surgical History  Procedure Laterality Date  . Cesarean section      x 2  . Colonoscopy    . Appendectomy        Medication List       This list is accurate as of: 04/23/15  5:39 PM.  Always use your most recent med list.               amLODipine 5 MG tablet  Commonly known as:  NORVASC  Take 1 tablet (5 mg total) by mouth daily.     divalproex 125 MG capsule  Commonly known as:  DEPAKOTE SPRINKLE  Take 125 mg by mouth 2 (two) times daily.     ELIQUIS 5 MG Tabs tablet  Generic drug:  apixaban  Take 10 mg by mouth 2 (two) times daily. At 0800 and 1700     nutrition supplement (JUVEN) Pack  Take 1 packet by mouth 2 (two) times daily between meals.     feeding supplement (ENSURE ENLIVE) Liqd  Take 237 mLs by mouth 2 (two) times  daily between meals.     feeding supplement (PRO-STAT 64) Liqd  Take 30 mLs by mouth 2 (two) times daily between meals.     ferrous sulfate 325 (65 FE) MG tablet  Take 1 tablet (325 mg total) by mouth 3 (three) times daily after meals.     folic acid 1 MG tablet  Commonly known as:  FOLVITE  Take 1 tablet (1 mg total) by mouth daily.     HYDROcodone-acetaminophen 5-325 MG per tablet  Commonly known as:  NORCO/VICODIN  Take 1-2 tablets by mouth every 6 (six) hours as needed for moderate pain.     LORazepam 0.5 MG tablet  Commonly known as:  ATIVAN  Take 0.5 mg by mouth 2 (two) times daily.     multivitamin with minerals Tabs tablet  Take 1 tablet by mouth daily.     spiritus frumenti Soln  Commonly known as:  ethyl alcohol  Take 1 each by mouth daily. 1 Glass of wine daily     thiamine 100 MG tablet  Take 1 tablet (100 mg total) by mouth daily.        No orders of the defined types were placed in this encounter.    Immunization History  Administered Date(s)  Administered  . Pneumococcal Conjugate-13 07/31/2014  . Td 09/28/1995, 08/20/2008  . Tdap 07/18/2014  . Zoster 01/16/2008    History  Substance Use Topics  . Smoking status: Never Smoker   . Smokeless tobacco: Never Used  . Alcohol Use: 25.2 oz/week    42 Glasses of wine per week    Review of Systems  DATA OBTAINED: from nurse GENERAL:  no fevers, fatigue, appetite changes SKIN: No itching, rash HEENT: No complaint RESPIRATORY: No cough, wheezing, SOB CARDIAC: No chest pain, palpitations, lower extremity edema  GI: No abdominal pain, No N/V/D or constipation, No heartburn or reflux  GU: No dysuria, frequency or urgency, or incontinence  MUSCULOSKELETAL: No unrelieved bone/joint pain NEUROLOGIC: No headache, dizziness , falls almost daily mostly at night when she tries to get up to drink ETOH, needs something to take at night PSYCHIATRIC: No overt anxiety or sadness  Filed Vitals:   04/23/15 1204   BP: 120/67  Pulse: 75  Temp: 98 F (36.7 C)  Resp: 18    Physical Exam  GENERAL APPEARANCE: Alert, min conversant, No acute distress  SKIN: No diaphoresis rash HEENT: contusion L eye RESPIRATORY: Breathing is even, unlabored. Lung sounds are clear   CARDIOVASCULAR: Heart RRR no murmurs, rubs or gallops. No peripheral edema  GASTROINTESTINAL: Abdomen is soft, non-tender, not distended w/ normal bowel sounds.  GENITOURINARY: Bladder non tender, not distended  MUSCULOSKELETAL: flexion contracture BLE NEUROLOGIC: Cranial nerves 2-12 grossly intact. PSYCHIATRIC: dementia, odd  Patient Active Problem List   Diagnosis Date Noted  . Acute blood loss anemia 04/23/2015  . 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 Right leg swelling 03/21/2015  . DVT (deep venous thrombosis) 03/20/2015  . Pressure ulcer 03/14/2015  . Malnutrition of moderate degree 03/14/2015  . Closed right hip fracture 03/13/2015  . UTI (lower urinary tract infection) 03/13/2015  . Hyperlipidemia 03/13/2015  . Recurrent falls 10/31/2014  . Impaired glucose tolerance 10/31/2014  . Scalp laceration 08/04/2014  . Posterior pain of right hip 12/19/2012  . Urinary incontinence 04/23/2012  . 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 Facial fracture 04/10/2012  . Spinal fracture 04/10/2012  . Abdominal pain, other specified site 03/16/2012  . Alcohol abuse 02/27/2012  . Insomnia 09/17/2011  . Dementia with behavioral disturbance 06/23/2011  . Left shoulder pain 02/09/2011  . Preventative health care 02/01/2011  . ANXIETY DEPRESSION 06/12/2009  . DIZZINESS 06/12/2009  . ELECTROCARDIOGRAM, ABNORMAL 08/20/2008  . 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 DIVERTICULOSIS, COLON 08/04/2007  . MELANOMA, HX OF 08/04/2007  . HYPERLIPIDEMIA 06/24/2007  . LOW BACK PAIN 06/24/2007  . Morbid obesity 06/23/2007  . ANXIETY 06/23/2007  . Essential hypertension 06/23/2007  . ALLERGIC RHINITIS  06/23/2007  . GERD 06/23/2007  . Irritable bowel syndrome 06/23/2007  . POSTMENOPAUSAL STATUS 06/23/2007  . COLONIC POLYPS, HX OF 03/08/2002    CBC    Component Value Date/Time   WBC 4.7 03/16/2015 0500   RBC 2.68* 03/16/2015 0500   HGB 8.5* 03/16/2015 0500   HCT 25.5* 03/16/2015 0500   PLT 285 03/16/2015 0500   MCV 95.1 03/16/2015 0500   LYMPHSABS 1.3 03/13/2015 1544   MONOABS 0.9 03/13/2015 1544   EOSABS 0.2 03/13/2015 1544   BASOSABS 0.0 03/13/2015 1544    CMP     Component Value Date/Time   NA 139 03/16/2015 0500   K 3.0* 03/16/2015 0500   CL 109 03/16/2015 0500   CO2 23 03/16/2015 0500   GLUCOSE 89 03/16/2015 0500  GLUCOSE 106* 07/14/2006 0803   BUN 14 03/16/2015 0500   CREATININE 0.71 03/16/2015 0500   CALCIUM 8.3* 03/16/2015 0500   PROT 5.8* 03/15/2015 0500   ALBUMIN 2.3* 03/15/2015 0500   AST 19 03/15/2015 0500   ALT 11* 03/15/2015 0500   ALKPHOS 95 03/15/2015 0500   BILITOT 0.3 03/15/2015 0500   GFRNONAA >60 03/16/2015 0500   GFRAA >60 03/16/2015 0500    Assessment and Plan  Dementia with behavioral disturbance Depakote has been increased to 250 mg BID. Pt still up at all hours esp at night, falls every day. Have decided to treat her alcoholism as if she wasn't dementd ;Plan - start Librium 5 mg qHS with plan to go up to control her craving at night  Acute blood loss anemia Last Hb 8.5 in June; Plan - continue iron, check CBC  DVT (deep venous thrombosis) Ongoing RLE ; Plan - cont eliquis 5 mg BID    Hennie Duos, MD

## 2015-04-23 NOTE — Assessment & Plan Note (Signed)
Depakote has been increased to 250 mg BID. Pt still up at all hours esp at night, falls every day. Have decided to treat her alcoholism as if she wasn't dementd ;Plan - start Librium 5 mg qHS with plan to go up to control her craving at night

## 2015-04-23 NOTE — Assessment & Plan Note (Signed)
Last Hb 8.5 in June; Plan - continue iron, check CBC

## 2015-04-23 NOTE — Assessment & Plan Note (Signed)
Ongoing RLE ; Plan - cont eliquis 5 mg BID

## 2015-05-21 ENCOUNTER — Encounter: Payer: Self-pay | Admitting: Internal Medicine

## 2015-05-21 ENCOUNTER — Non-Acute Institutional Stay (SKILLED_NURSING_FACILITY): Payer: Medicare Other | Admitting: Internal Medicine

## 2015-05-21 DIAGNOSIS — D62 Acute posthemorrhagic anemia: Secondary | ICD-10-CM | POA: Diagnosis not present

## 2015-05-21 DIAGNOSIS — I10 Essential (primary) hypertension: Secondary | ICD-10-CM | POA: Diagnosis not present

## 2015-05-21 DIAGNOSIS — E785 Hyperlipidemia, unspecified: Secondary | ICD-10-CM

## 2015-05-21 NOTE — Progress Notes (Signed)
MRN: [id number] Name: Tara Shaffer  Sex: female Age: 69 y.o. DOB: 05/09/1946  [place] #: [facility] Facility/Room:201W Level Of Care: SNF Provider: Inocencio Homes D Emergency Contacts: Extended Emergency Contact Information Primary Emergency Contact: Rosenberry,Richard Address: [place]          [place] of [place] Phone: [phone] Relation: Son  Code Status:   Allergies: Codeine phosphate  Chief Complaint  Patient presents with  . Medical Management of Chronic Issues    HPI: Patient is 69 y.o. female with hx ETOH abuse, HLD, HTN, hip fx from fall at home, dementia, DVT who is being seen today for anemia, HLD and HTN.  Past Medical History  Diagnosis Date  . MELANOMA, HX OF 08/04/2007  . ALLERGIC RHINITIS 06/23/2007  . ANXIETY DEPRESSION 06/12/2009  . ANXIETY 06/23/2007  . 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 GERD 06/23/2007  . HYPERLIPIDEMIA 06/24/2007  . HYPERTENSION 06/23/2007  . COLONIC POLYPS, HX OF 03/08/2002  . DIVERTICULOSIS, COLON 08/04/2007  . Irritable bowel syndrome 06/23/2007  . Memory dysfunction 06/23/2011  . Cancer     skin cx/ hx melanoma  . LOW BACK PAIN 06/24/2007  . Alcohol abuse 02/27/2012    Past Surgical History  Procedure Laterality Date  . Cesarean section      x 2  . Colonoscopy    . Appendectomy        Medication List       This list is accurate as of: 05/21/15 10:34 AM.  Always use your most recent med list.               amLODipine 5 MG tablet  Commonly known as:  NORVASC  Take 1 tablet (5 mg total) by mouth daily.     divalproex 125 MG capsule  Commonly known as:  DEPAKOTE SPRINKLE  Take 125 mg by mouth 2 (two) times daily.     ELIQUIS 5 MG Tabs tablet  Generic drug:  apixaban  Take 10 mg by mouth 2 (two) times daily. At 0800 and 1700     nutrition supplement (JUVEN) Pack  Take 1 packet by mouth 2 (two) times daily between meals.     feeding supplement (ENSURE ENLIVE)  Liqd  Take 237 mLs by mouth 2 (two) times daily between meals.     feeding supplement (PRO-STAT 64) Liqd  Take 30 mLs by mouth 2 (two) times daily between meals.     ferrous sulfate 325 (65 FE) MG tablet  Take 1 tablet (325 mg total) by mouth 3 (three) times daily after meals.     folic acid 1 MG tablet  Commonly known as:  FOLVITE  Take 1 tablet (1 mg total) by mouth daily.     HYDROcodone-acetaminophen 5-325 MG per tablet  Commonly known as:  NORCO/VICODIN  Take 1-2 tablets by mouth every 6 (six) hours as needed for moderate pain.     LORazepam 0.5 MG tablet  Commonly known as:  ATIVAN  Take 0.5 mg by mouth 2 (two) times daily.     multivitamin with minerals Tabs tablet  Take 1 tablet by mouth daily.     spiritus frumenti Soln  Commonly known as:  ethyl alcohol  Take 1 each by mouth daily. 1 Glass of wine daily     thiamine 100 MG tablet  Take 1 tablet (100 mg total) by mouth daily.        No orders of the defined types were placed  in this encounter.    Immunization History  Administered Date(s) Administered  . Pneumococcal Conjugate-13 07/31/2014  . Td 09/28/1995, 08/20/2008  . Tdap 07/18/2014  . Zoster 01/16/2008    Social History  Substance Use Topics  . Smoking status: Never Smoker   . Smokeless tobacco: Never Used  . Alcohol Use: 25.2 oz/week    42 Glasses of wine per week    Review of Systems  DATA OBTAINED: from  Nurse; pt baseline confusion GENERAL:  no fevers, fatigue, appetite changes SKIN: No itching, rash HEENT: No complaint RESPIRATORY: No cough, wheezing, SOB CARDIAC: No chest pain, palpitations, lower extremity edema  GI: No abdominal pain, No N/V/D or constipation, No heartburn or reflux  GU: No dysuria, frequency or urgency, or incontinence  MUSCULOSKELETAL: No unrelieved bone/joint pain NEUROLOGIC: No headache, dizziness  PSYCHIATRIC: No overt anxiety or sadness  Filed Vitals:   05/21/15 1007  BP: 118/69  Pulse: 72  Temp: 97  F (36.1 C)  Resp: 18    Physical Exam  GENERAL APPEARANCE: Alert, min conversant, No acute distress , in WC, in hall looking much improved over pAST month SKIN: No diaphoresis rash HEENT: Unremarkable RESPIRATORY: Breathing is even, unlabored. Lung sounds are clear   CARDIOVASCULAR: Heart RRR no murmurs, rubs or gallops. No peripheral edema  GASTROINTESTINAL: Abdomen is soft, non-tender, not distended w/ normal bowel sounds.  GENITOURINARY: Bladder non tender, not distended  MUSCULOSKELETAL: BLE contractures NEUROLOGIC: Cranial nerves 2-12 grossly intact. Moves all extremities PSYCHIATRIC: odd affect , baseline confused, no behavioral issues  Patient Active Problem List   Diagnosis Date Noted  . Acute blood loss anemia 04/23/2015  . 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 Right leg swelling 03/21/2015  . DVT (deep venous thrombosis) 03/20/2015  . Pressure ulcer 03/14/2015  . Malnutrition of moderate degree 03/14/2015  . Closed right hip fracture 03/13/2015  . UTI (lower urinary tract infection) 03/13/2015  . Hyperlipidemia 03/13/2015  . Recurrent falls 10/31/2014  . Impaired glucose tolerance 10/31/2014  . Scalp laceration 08/04/2014  . Posterior pain of right hip 12/19/2012  . Urinary incontinence 04/23/2012  . 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 Facial fracture 04/10/2012  . Spinal fracture 04/10/2012  . Abdominal pain, other specified site 03/16/2012  . Alcohol abuse 02/27/2012  . Insomnia 09/17/2011  . Dementia with behavioral disturbance 06/23/2011  . Left shoulder pain 02/09/2011  . Preventative health care 02/01/2011  . ANXIETY DEPRESSION 06/12/2009  . DIZZINESS 06/12/2009  . ELECTROCARDIOGRAM, ABNORMAL 08/20/2008  . 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 DIVERTICULOSIS, COLON 08/04/2007  . MELANOMA, HX OF 08/04/2007  . HYPERLIPIDEMIA 06/24/2007  . LOW BACK PAIN 06/24/2007  . Morbid obesity 06/23/2007  . ANXIETY  06/23/2007  . Essential hypertension 06/23/2007  . ALLERGIC RHINITIS 06/23/2007  . GERD 06/23/2007  . Irritable bowel syndrome 06/23/2007  . POSTMENOPAUSAL STATUS 06/23/2007  . COLONIC POLYPS, HX OF 03/08/2002    CBC    Component Value Date/Time   WBC 4.7 03/16/2015 0500   RBC 2.68* 03/16/2015 0500   HGB 8.5* 03/16/2015 0500   HCT 25.5* 03/16/2015 0500   PLT 285 03/16/2015 0500   MCV 95.1 03/16/2015 0500   LYMPHSABS 1.3 03/13/2015 1544   MONOABS 0.9 03/13/2015 1544   EOSABS 0.2 03/13/2015 1544   BASOSABS 0.0 03/13/2015 1544    CMP     Component Value Date/Time   NA 139 03/16/2015 0500   K 3.0* 03/16/2015 0500   CL 109 03/16/2015 0500  CO2 23 03/16/2015 0500   GLUCOSE 89 03/16/2015 0500   GLUCOSE 106* 07/14/2006 0803   BUN 14 03/16/2015 0500   CREATININE 0.71 03/16/2015 0500   CALCIUM 8.3* 03/16/2015 0500   PROT 5.8* 03/15/2015 0500   ALBUMIN 2.3* 03/15/2015 0500   AST 19 03/15/2015 0500   ALT 11* 03/15/2015 0500   ALKPHOS 95 03/15/2015 0500   BILITOT 0.3 03/15/2015 0500   GFRNONAA >60 03/16/2015 0500   GFRAA >60 03/16/2015 0500    Assessment and Plan  Acute blood loss anemia Hb 8/8 was 8.0 and 8.5 2 months ago; pt is on folate and iron; Plan - cont folate , iron; order anemia panel, recheck Hb  Hyperlipidemia In 04/2015 LDL 88, HDL 32 on no meds; Plan -this is acceptable for risk factor age and HTN  Essential hypertension BP is well controlled now; Plan - cont norvasc    Hennie Duos, MD

## 2015-05-21 NOTE — Assessment & Plan Note (Signed)
In 04/2015 LDL 88, HDL 32 on no meds; Plan -this is acceptable for risk factor age and HTN

## 2015-05-21 NOTE — Assessment & Plan Note (Signed)
BP is well controlled now; Plan - cont norvasc

## 2015-05-21 NOTE — Assessment & Plan Note (Signed)
Hb 8/8 was 8.0 and 8.5 2 months ago; pt is on folate and iron; Plan - cont folate , iron; order anemia panel, recheck Hb

## 2015-06-11 ENCOUNTER — Encounter: Payer: Self-pay | Admitting: Internal Medicine

## 2015-06-11 ENCOUNTER — Non-Acute Institutional Stay (SKILLED_NURSING_FACILITY): Payer: Medicare Other | Admitting: Internal Medicine

## 2015-06-11 DIAGNOSIS — F39 Unspecified mood [affective] disorder: Secondary | ICD-10-CM | POA: Diagnosis not present

## 2015-06-11 DIAGNOSIS — R7302 Impaired glucose tolerance (oral): Secondary | ICD-10-CM | POA: Diagnosis not present

## 2015-06-11 DIAGNOSIS — F1010 Alcohol abuse, uncomplicated: Secondary | ICD-10-CM | POA: Diagnosis not present

## 2015-06-11 DIAGNOSIS — F411 Generalized anxiety disorder: Secondary | ICD-10-CM | POA: Diagnosis not present

## 2015-06-11 NOTE — Progress Notes (Signed)
MRN: [id number] Name: Tara Shaffer  Sex: female Age: 69 y.o. DOB: August 22, 1946  [place] #: [facility] Facility/Room:201 Level Of Care: SNF Provider: Inocencio Homes D Emergency Contacts: Extended Emergency Contact Information Primary Emergency Contact: Ouderkirk,Richard Address: [place]          [place] of [place] Phone: [phone] Relation: Son  Code Status: FULL  Allergies: Codeine phosphate  Chief Complaint  Patient presents with  . Medical Management of Chronic Issues    HPI: Patient is 69 y.o. female with ETOH abuse, anxiety, HTN, GERD, anemia being seen for routine alcohol abuse, anxiety and mood disorder.   Past Medical History  Diagnosis Date  . MELANOMA, HX OF 08/04/2007  . ALLERGIC RHINITIS 06/23/2007  . ANXIETY DEPRESSION 06/12/2009  . ANXIETY 06/23/2007  . 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 GERD 06/23/2007  . HYPERLIPIDEMIA 06/24/2007  . HYPERTENSION 06/23/2007  . COLONIC POLYPS, HX OF 03/08/2002  . DIVERTICULOSIS, COLON 08/04/2007  . Irritable bowel syndrome 06/23/2007  . Memory dysfunction 06/23/2011  . Cancer     skin cx/ hx melanoma  . LOW BACK PAIN 06/24/2007  . Alcohol abuse 02/27/2012    Past Surgical History  Procedure Laterality Date  . Cesarean section      x 2  . Colonoscopy    . Appendectomy        Medication List       This list is accurate as of: 06/11/15  3:45 PM.  Always use your most recent med list.               amLODipine 5 MG tablet  Commonly known as:  NORVASC  Take 1 tablet (5 mg total) by mouth daily.     divalproex 125 MG capsule  Commonly known as:  DEPAKOTE SPRINKLE  Take 125 mg by mouth 2 (two) times daily.     ELIQUIS 5 MG Tabs tablet  Generic drug:  apixaban  Take 10 mg by mouth 2 (two) times daily. At 0800 and 1700     nutrition supplement (JUVEN) Pack  Take 1 packet by mouth 2 (two) times daily between meals.     feeding supplement (ENSURE ENLIVE) Liqd   Take 237 mLs by mouth 2 (two) times daily between meals.     feeding supplement (PRO-STAT 64) Liqd  Take 30 mLs by mouth 2 (two) times daily between meals.     ferrous sulfate 325 (65 FE) MG tablet  Take 1 tablet (325 mg total) by mouth 3 (three) times daily after meals.     folic acid 1 MG tablet  Commonly known as:  FOLVITE  Take 1 tablet (1 mg total) by mouth daily.     HYDROcodone-acetaminophen 5-325 MG per tablet  Commonly known as:  NORCO/VICODIN  Take 1-2 tablets by mouth every 6 (six) hours as needed for moderate pain.     LORazepam 0.5 MG tablet  Commonly known as:  ATIVAN  Take 0.5 mg by mouth 2 (two) times daily.     multivitamin with minerals Tabs tablet  Take 1 tablet by mouth daily.     spiritus frumenti Soln  Commonly known as:  ethyl alcohol  Take 1 each by mouth daily. 1 Glass of wine daily     thiamine 100 MG tablet  Take 1 tablet (100 mg total) by mouth daily.        No orders of the defined types were placed in this encounter.  Immunization History  Administered Date(s) Administered  . Pneumococcal Conjugate-13 07/31/2014  . Td 09/28/1995, 08/20/2008  . Tdap 07/18/2014  . Zoster 01/16/2008    Social History  Substance Use Topics  . Smoking status: Never Smoker   . Smokeless tobacco: Never Used  . Alcohol Use: 25.2 oz/week    42 Glasses of wine per week    Review of Systems  DATA OBTAINED: from patient, nurse-mostly nurse GENERAL:  no fevers, fatigue, appetite changes SKIN: No itching, rash HEENT: No complaint RESPIRATORY: No cough, wheezing, SOB CARDIAC: No chest pain, palpitations, lower extremity edema  GI: No abdominal pain, No N/V/D or constipation, No heartburn or reflux  GU: No dysuria, frequency or urgency, or incontinence  MUSCULOSKELETAL: No unrelieved bone/joint pain NEUROLOGIC: No headache, dizziness  PSYCHIATRIC: No overt anxiety or sadness  Filed Vitals:   06/11/15 1531  BP: 118/69  Pulse: 64  Temp: 97 F (36.1  C)  Resp: 18    Physical Exam  GENERAL APPEARANCE: Alert, conversant, No acute distress ; looking so much better than a month ago SKIN: No diaphoresis rash HEENT: Unremarkable RESPIRATORY: Breathing is even, unlabored. Lung sounds are clear   CARDIOVASCULAR: Heart RRR no murmurs, rubs or gallops. No peripheral edema  GASTROINTESTINAL: Abdomen is soft, non-tender, not distended w/ normal bowel sounds.  GENITOURINARY: Bladder non tender, not distended  MUSCULOSKELETAL: No abnormal joints or musculature;B leg wrapos NEUROLOGIC: Cranial nerves 2-12 grossly intact. Moves all extremities PSYCHIATRIC: dementia, no behavioral issues  Patient Active Problem List   Diagnosis Date Noted  . Mood disorder 06/11/2015  . Acute blood loss anemia 04/23/2015  . 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 Right leg swelling 03/21/2015  . DVT (deep venous thrombosis) 03/20/2015  . Pressure ulcer 03/14/2015  . Malnutrition of moderate degree 03/14/2015  . Closed right hip fracture 03/13/2015  . UTI (lower urinary tract infection) 03/13/2015  . Hyperlipidemia 03/13/2015  . Recurrent falls 10/31/2014  . Impaired glucose tolerance 10/31/2014  . Scalp laceration 08/04/2014  . Posterior pain of right hip 12/19/2012  . Urinary incontinence 04/23/2012  . 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 Facial fracture 04/10/2012  . Spinal fracture 04/10/2012  . Abdominal pain, other specified site 03/16/2012  . Alcohol abuse 02/27/2012  . Insomnia 09/17/2011  . Dementia with behavioral disturbance 06/23/2011  . Left shoulder pain 02/09/2011  . Preventative health care 02/01/2011  . ANXIETY DEPRESSION 06/12/2009  . DIZZINESS 06/12/2009  . ELECTROCARDIOGRAM, ABNORMAL 08/20/2008  . 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 DIVERTICULOSIS, COLON 08/04/2007  . MELANOMA, HX OF 08/04/2007  . HYPERLIPIDEMIA 06/24/2007  . LOW BACK PAIN 06/24/2007  . Morbid obesity 06/23/2007  .  Anxiety state 06/23/2007  . Essential hypertension 06/23/2007  . ALLERGIC RHINITIS 06/23/2007  . GERD 06/23/2007  . Irritable bowel syndrome 06/23/2007  . POSTMENOPAUSAL STATUS 06/23/2007  . COLONIC POLYPS, HX OF 03/08/2002    CBC    Component Value Date/Time   WBC 4.7 03/16/2015 0500   RBC 2.68* 03/16/2015 0500   HGB 8.5* 03/16/2015 0500   HCT 25.5* 03/16/2015 0500   PLT 285 03/16/2015 0500   MCV 95.1 03/16/2015 0500   LYMPHSABS 1.3 03/13/2015 1544   MONOABS 0.9 03/13/2015 1544   EOSABS 0.2 03/13/2015 1544   BASOSABS 0.0 03/13/2015 1544    CMP     Component Value Date/Time   NA 139 03/16/2015 0500   K 3.0* 03/16/2015 0500   CL 109 03/16/2015 0500   CO2 23 03/16/2015  0500   GLUCOSE 89 03/16/2015 0500   GLUCOSE 106* 07/14/2006 0803   BUN 14 03/16/2015 0500   CREATININE 0.71 03/16/2015 0500   CALCIUM 8.3* 03/16/2015 0500   PROT 5.8* 03/15/2015 0500   ALBUMIN 2.3* 03/15/2015 0500   AST 19 03/15/2015 0500   ALT 11* 03/15/2015 0500   ALKPHOS 95 03/15/2015 0500   BILITOT 0.3 03/15/2015 0500   GFRNONAA >60 03/16/2015 0500   GFRAA >60 03/16/2015 0500    Assessment and Plan  Alcohol abuse Pt has not been getting ETOH while here. She now has her hair washed and combed, is clean and much more active in [place] in hall;pt does however still look for ETOH- " do you have any beer or wine? Ill buy it from you"; Plan - cont supportive care  Anxiety state Worse in afternoon and evening; Plan inc ativan to 0.5 mg QID, 8,12,4,8  Mood disorder Fairly well controlled on Depakote a am and qHS and risperdal at 5pm; Plan - continue current regimen    Hennie Duos, MD

## 2015-06-11 NOTE — Assessment & Plan Note (Signed)
Pt has not been getting ETOH while here. She now has her hair washed and combed, is clean and much more active in [place] in hall;pt does however still look for ETOH- " do you have any beer or wine? Ill buy it from you"; Plan - cont supportive care

## 2015-06-11 NOTE — Assessment & Plan Note (Signed)
Worse in afternoon and evening; Plan inc ativan to 0.5 mg QID, 8,12,4,8

## 2015-06-11 NOTE — Assessment & Plan Note (Signed)
Fairly well controlled on Depakote a am and qHS and risperdal at 5pm; Plan - continue current regimen

## 2015-06-13 ENCOUNTER — Encounter: Payer: Self-pay | Admitting: Internal Medicine

## 2015-08-01 ENCOUNTER — Non-Acute Institutional Stay (SKILLED_NURSING_FACILITY): Payer: Medicare Other | Admitting: Internal Medicine

## 2015-08-01 ENCOUNTER — Encounter: Payer: Self-pay | Admitting: Internal Medicine

## 2015-08-01 DIAGNOSIS — F29 Unspecified psychosis not due to a substance or known physiological condition: Secondary | ICD-10-CM | POA: Diagnosis not present

## 2015-08-01 DIAGNOSIS — R296 Repeated falls: Secondary | ICD-10-CM | POA: Diagnosis not present

## 2015-08-01 NOTE — Assessment & Plan Note (Signed)
Pt has been more calm in general, which translates to decreased trying to get out of WC and fewer falls; Pla - cont to monitor

## 2015-08-01 NOTE — Progress Notes (Signed)
MRN: [id number] Name: Tara Shaffer  Sex: female Age: 69 y.o. DOB: 05-Aug-1946  [place] #: [facility] Facility/Room: Level Of Care: SNF Provider: Inocencio Homes D Emergency Contacts: Extended Emergency Contact Information Primary Emergency Contact: Vought,Richard Address: [place]          [place] Phone: [phone] Relation: Son  Code Status:   Allergies: Codeine phosphate  Chief Complaint  Patient presents with  . Medical Management of Chronic Issues    HPI: Patient is 69 y.o. female who is being seen for routine issues of psychosis, DVT and recurrent falls.  Past Medical History  Diagnosis Date  . MELANOMA, HX OF 08/04/2007  . ALLERGIC RHINITIS 06/23/2007  . ANXIETY DEPRESSION 06/12/2009  . ANXIETY 06/23/2007  . 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 GERD 06/23/2007  . HYPERLIPIDEMIA 06/24/2007  . HYPERTENSION 06/23/2007  . COLONIC POLYPS, HX OF 03/08/2002  . DIVERTICULOSIS, COLON 08/04/2007  . Irritable bowel syndrome 06/23/2007  . Memory dysfunction 06/23/2011  . Cancer ([place])     skin cx/ hx melanoma  . LOW BACK PAIN 06/24/2007  . Alcohol abuse 02/27/2012    Past Surgical History  Procedure Laterality Date  . Cesarean section      x 2  . Colonoscopy    . Appendectomy        Medication List       This list is accurate as of: 08/01/15  8:40 PM.  Always use your most recent med list.               amLODipine 5 MG tablet  Commonly known as:  NORVASC  Take 1 tablet (5 mg total) by mouth daily.     divalproex 125 MG capsule  Commonly known as:  DEPAKOTE SPRINKLE  Take 125 mg by mouth 2 (two) times daily.     ELIQUIS 5 MG Tabs tablet  Generic drug:  apixaban  Take 10 mg by mouth 2 (two) times daily. At 0800 and 1700     nutrition supplement (JUVEN) Pack  Take 1 packet by mouth 2 (two) times daily between meals.     feeding supplement (ENSURE ENLIVE) Liqd  Take 237 mLs by mouth 2 (two)  times daily between meals.     feeding supplement (PRO-STAT 64) Liqd  Take 30 mLs by mouth 2 (two) times daily between meals.     ferrous sulfate 325 (65 FE) MG tablet  Take 1 tablet (325 mg total) by mouth 3 (three) times daily after meals.     folic acid 1 MG tablet  Commonly known as:  FOLVITE  Take 1 tablet (1 mg total) by mouth daily.     HYDROcodone-acetaminophen 5-325 MG tablet  Commonly known as:  NORCO/VICODIN  Take 1-2 tablets by mouth every 6 (six) hours as needed for moderate pain.     LORazepam 0.5 MG tablet  Commonly known as:  ATIVAN  Take 0.5 mg by mouth 2 (two) times daily.     multivitamin with minerals Tabs tablet  Take 1 tablet by mouth daily.     spiritus frumenti Soln  Commonly known as:  ethyl alcohol  Take 1 each by mouth daily. 1 Glass of wine daily     thiamine 100 MG tablet  Take 1 tablet (100 mg total) by mouth daily.        No orders of the defined types were placed in this encounter.    Immunization History  Administered  Date(s) Administered  . Pneumococcal Conjugate-13 07/31/2014  . Td 09/28/1995, 08/20/2008  . Tdap 07/18/2014  . Zoster 01/16/2008    Social History  Substance Use Topics  . Smoking status: Never Smoker   . Smokeless tobacco: Never Used  . Alcohol Use: 25.2 oz/week    42 Glasses of wine per week    Review of Systems UTO from pt - unreliable; per nursing - no acute concerns, most behavoirs late afternoon   Filed Vitals:   08/01/15 2028  BP: 118/69  Pulse: 79  Temp: 97.1 F (36.2 C)  Resp: 20    Physical Exam  GENERAL APPEARANCE: Alert, min  conversant, No acute distress  SKIN: No diaphoresis rash HEENT: Unremarkable RESPIRATORY: Breathing is even, unlabored. Lung sounds are clear   CARDIOVASCULAR: Heart RRR no murmurs, rubs or gallops. No peripheral edema  GASTROINTESTINAL: Abdomen is soft, non-tender, not distended w/ normal bowel sounds.  GENITOURINARY: Bladder non tender, not distended   MUSCULOSKELETAL: both legs chronically in flexion at knees NEUROLOGIC: Cranial nerves 2-12 grossly intact. Moves all extremities PSYCHIATRIC: odd, but improved from admission, no behavioral issues  Patient Active Problem List   Diagnosis Date Noted  . Psychosis 08/01/2015  . Mood disorder ([place]) 06/11/2015  . Acute blood loss anemia 04/23/2015  . 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 Right leg swelling 03/21/2015  . DVT, femoral, chronic ([place]) 03/20/2015  . Pressure ulcer 03/14/2015  . Malnutrition of moderate degree ([place]) 03/14/2015  . Closed right hip fracture ([place]) 03/13/2015  . UTI (lower urinary tract infection) 03/13/2015  . Hyperlipidemia 03/13/2015  . Recurrent falls 10/31/2014  . Impaired glucose tolerance 10/31/2014  . Scalp laceration 08/04/2014  . Posterior pain of right hip 12/19/2012  . Urinary incontinence 04/23/2012  . 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 Facial fracture ([place]) 04/10/2012  . Spinal fracture 04/10/2012  . Abdominal pain, other specified site 03/16/2012  . Alcohol abuse 02/27/2012  . Insomnia 09/17/2011  . Dementia with behavioral disturbance 06/23/2011  . Left shoulder pain 02/09/2011  . Preventative health care 02/01/2011  . ANXIETY DEPRESSION 06/12/2009  . DIZZINESS 06/12/2009  . ELECTROCARDIOGRAM, ABNORMAL 08/20/2008  . 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 DIVERTICULOSIS, COLON 08/04/2007  . MELANOMA, HX OF 08/04/2007  . HYPERLIPIDEMIA 06/24/2007  . LOW BACK PAIN 06/24/2007  . Morbid obesity ([place]) 06/23/2007  . Anxiety state 06/23/2007  . Essential hypertension 06/23/2007  . ALLERGIC RHINITIS 06/23/2007  . GERD 06/23/2007  . Irritable bowel syndrome 06/23/2007  . POSTMENOPAUSAL STATUS 06/23/2007  . COLONIC POLYPS, HX OF 03/08/2002    CBC    Component Value Date/Time   WBC 4.7 03/16/2015 0500   RBC 2.68* 03/16/2015 0500   HGB 8.5* 03/16/2015 0500   HCT 25.5* 03/16/2015 0500   PLT  285 03/16/2015 0500   MCV 95.1 03/16/2015 0500   LYMPHSABS 1.3 03/13/2015 1544   MONOABS 0.9 03/13/2015 1544   EOSABS 0.2 03/13/2015 1544   BASOSABS 0.0 03/13/2015 1544    CMP     Component Value Date/Time   NA 139 03/16/2015 0500   K 3.0* 03/16/2015 0500   CL 109 03/16/2015 0500   CO2 23 03/16/2015 0500   GLUCOSE 89 03/16/2015 0500   GLUCOSE 106* 07/14/2006 0803   BUN 14 03/16/2015 0500   CREATININE 0.71 03/16/2015 0500   CALCIUM 8.3* 03/16/2015 0500   PROT 5.8* 03/15/2015 0500   ALBUMIN 2.3* 03/15/2015 0500   AST 19 03/15/2015 0500   ALT 11*  03/15/2015 0500   ALKPHOS 95 03/15/2015 0500   BILITOT 0.3 03/15/2015 0500   GFRNONAA >60 03/16/2015 0500   GFRAA >60 03/16/2015 0500    Assessment and Plan  Psychosis Pt has continued with odd behavoirs, seen by psych and started on seroquel 12.5 mg for new dx psychoses; plan - monitor response  Recurrent falls Pt has been more calm in general, which translates to decreased trying to get out of WC and fewer falls; Pla - cont to monitor  DVT, femoral, chronic (HCC) No problems or complications; Plan - cont eliquis 5 mg BID    Hennie Duos, MD

## 2015-08-01 NOTE — Assessment & Plan Note (Signed)
Pt has continued with odd behavoirs, seen by psych and started on seroquel 12.5 mg for new dx psychoses; plan - monitor response

## 2015-08-01 NOTE — Assessment & Plan Note (Signed)
No problems or complications; Plan - cont eliquis 5 mg BID

## 2015-08-21 ENCOUNTER — Encounter (HOSPITAL_COMMUNITY): Payer: Self-pay

## 2015-08-21 ENCOUNTER — Emergency Department (HOSPITAL_COMMUNITY)
Admission: EM | Admit: 2015-08-21 | Discharge: 2015-08-21 | Disposition: A | Discharge status: Home / Self Care | Payer: Medicare Other | Attending: Emergency Medicine | Admitting: Emergency Medicine

## 2015-08-21 ENCOUNTER — Emergency Department (HOSPITAL_COMMUNITY): Payer: Medicare Other

## 2015-08-21 ENCOUNTER — Encounter (HOSPITAL_COMMUNITY): Payer: Self-pay | Admitting: Emergency Medicine

## 2015-08-21 ENCOUNTER — Emergency Department (HOSPITAL_COMMUNITY)
Admission: EM | Admit: 2015-08-21 | Discharge: 2015-08-21 | Disposition: A | Discharge status: Skilled Nursing Facility | Payer: Medicare Other | Source: Home / Self Care | Attending: Emergency Medicine | Admitting: Emergency Medicine

## 2015-08-21 DIAGNOSIS — I10 Essential (primary) hypertension: Secondary | ICD-10-CM | POA: Diagnosis not present

## 2015-08-21 DIAGNOSIS — Z8719 Personal history of other diseases of the digestive system: Secondary | ICD-10-CM | POA: Diagnosis not present

## 2015-08-21 DIAGNOSIS — Z86010 Personal history of colonic polyps: Secondary | ICD-10-CM | POA: Insufficient documentation

## 2015-08-21 DIAGNOSIS — S0101XA Laceration without foreign body of scalp, initial encounter: Secondary | ICD-10-CM | POA: Diagnosis not present

## 2015-08-21 DIAGNOSIS — Y998 Other external cause status: Secondary | ICD-10-CM | POA: Insufficient documentation

## 2015-08-21 DIAGNOSIS — M5030 Other cervical disc degeneration, unspecified cervical region: Secondary | ICD-10-CM | POA: Insufficient documentation

## 2015-08-21 DIAGNOSIS — W1839XA Other fall on same level, initial encounter: Secondary | ICD-10-CM | POA: Diagnosis not present

## 2015-08-21 DIAGNOSIS — Z85820 Personal history of malignant melanoma of skin: Secondary | ICD-10-CM | POA: Diagnosis not present

## 2015-08-21 DIAGNOSIS — Z8639 Personal history of other endocrine, nutritional and metabolic disease: Secondary | ICD-10-CM | POA: Diagnosis not present

## 2015-08-21 DIAGNOSIS — IMO0002 Reserved for concepts with insufficient information to code with codable children: Secondary | ICD-10-CM

## 2015-08-21 DIAGNOSIS — Y9289 Other specified places as the place of occurrence of the external cause: Secondary | ICD-10-CM | POA: Diagnosis not present

## 2015-08-21 DIAGNOSIS — Y9389 Activity, other specified: Secondary | ICD-10-CM | POA: Diagnosis not present

## 2015-08-21 DIAGNOSIS — Z8709 Personal history of other diseases of the respiratory system: Secondary | ICD-10-CM | POA: Diagnosis not present

## 2015-08-21 DIAGNOSIS — Z79899 Other long term (current) drug therapy: Secondary | ICD-10-CM | POA: Insufficient documentation

## 2015-08-21 DIAGNOSIS — F0390 Unspecified dementia without behavioral disturbance: Secondary | ICD-10-CM | POA: Diagnosis not present

## 2015-08-21 DIAGNOSIS — F419 Anxiety disorder, unspecified: Secondary | ICD-10-CM | POA: Diagnosis not present

## 2015-08-21 DIAGNOSIS — S0990XA Unspecified injury of head, initial encounter: Secondary | ICD-10-CM | POA: Diagnosis present

## 2015-08-21 DIAGNOSIS — S0181XA Laceration without foreign body of other part of head, initial encounter: Secondary | ICD-10-CM | POA: Diagnosis not present

## 2015-08-21 DIAGNOSIS — W19XXXA Unspecified fall, initial encounter: Secondary | ICD-10-CM

## 2015-08-21 LAB — CBG MONITORING, ED
Glucose-Capillary: 84 mg/dL (ref 65–99)
Glucose-Capillary: 85 mg/dL (ref 65–99)

## 2015-08-21 MED ORDER — LIDOCAINE-EPINEPHRINE (PF) 2 %-1:200000 IJ SOLN
10.0000 mL | Freq: Once | INTRAMUSCULAR | Status: AC
  Administered 2015-08-21: 10 mL via INTRADERMAL
  Filled 2015-08-21: qty 20

## 2015-08-21 NOTE — ED Notes (Signed)
Pt. Was sent back to the ED due to her laceration was bleeding Two ABD pads are on the wound,  Bleeding is controlled

## 2015-08-21 NOTE — ED Notes (Signed)
According to EMS pt had an unwitnessed fall out of her wheelchair this AM.  Staff heard the fall and went to assist, no LOC however pt does have a laceration on the top of her head and is on blood thinners.  She is from [facility] w/ a diagnosis of hx of falls, pressure ulcer of sacral region (stage 3), muscle weakness, dementia and hx of dysphagia.

## 2015-08-21 NOTE — Discharge Instructions (Signed)
It is important to follow up with your doctor either today or tomorrow for reevaluation of your hand laceration. It is also important for you to speak with your provider about your Eliquis therapy. This intervention may need to be reevaluated given your history of dementia and frequent falls. Return to ED for any new or worsening symptoms.

## 2015-08-21 NOTE — ED Notes (Signed)
Pt. Was here earlier for head laceration.  Laceration repaired by staples.  Upon transport to [facility] by Pt.ar they noticed that the wound was  Bleeding.  They report that it has bled through 3 abdominal pads.  Dr. Tamera Punt notified pt. Has arrived.  Pt. Is alert and oriented to self and place.

## 2015-08-21 NOTE — ED Notes (Signed)
Patient transported to CT 

## 2015-08-21 NOTE — ED Provider Notes (Signed)
Patient returns for evaluation of previously stapled wound that is now bleeding. Hemostasis was achieved in the ED with quick clot, staples reapplied. Patient is stable for discharge. Overall, patient appears well, at her baseline, hemodynamically stable and is appropriate for discharge. LACERATION REPAIR Performed by: Verl Dicker Authorized by: Verl Dicker Consent: Verbal consent obtained. Risks and benefits: risks, benefits and alternatives were discussed Consent given by: patient Patient identity confirmed: provided demographic data Prepped and Draped in normal sterile fashion Wound explored  Laceration Location: Frontal scalp  Laceration Length: 2 cm  No Foreign Bodies seen or palpated  Anesthesia: local infiltration  Local anesthetic: None   Anesthetic total: 0 ml  Irrigation method: syringe Amount of cleaning: standard  Skin closure: Staples   Number of sutures: 4   Patient tolerance: Patient tolerated the procedure well with no immediate complications.  Filed Vitals:   08/21/15 1030 08/21/15 1045 08/21/15 1100 08/21/15 1218  BP: 102/61 108/63 105/59 121/51  Pulse: 96 93 94 99  Temp:      TempSrc:      Resp:    18  SpO2: 98% 99% 100% 100%   Prior to patient discharge, I discussed and reviewed this case with Dr.Belfi, who also saw and evaluated the pt. Pt stable and appropriate for DC     Comer Locket, PA-C 08/22/15 [id number]  Malvin Johns, MD 08/22/15 [phone]

## 2015-08-21 NOTE — ED Provider Notes (Signed)
CSN: [id number]     Arrival date & time 08/21/15  [id number] History   First MD Initiated Contact with Patient 08/21/15 [phone]     Chief Complaint  Patient presents with  . Fall  . Laceration   Level V caveat: Dementia  (Consider location/radiation/quality/duration/timing/severity/associated sxs/prior Treatment) HPI Tara Shaffer is a 69 y.o. female with a history of dementia and multiple falls comes in for evaluation from her rehabilitation facility, [facility], for evaluation of fall. Her staff at the facility, patient had unwitnessed fall, but no LOC area they report she is on blood thinners. She sustained a laceration to the top of her head. She does have a history of multiple falls, muscle weakness, dementia and dysphagia. Nursing facility reports that she is at her baseline upon arrival in the ED. Patient is able to give answers to simple questions and reports mild headache at this time, but denies any other medical symptoms.  Past Medical History  Diagnosis Date  . MELANOMA, HX OF 08/04/2007  . ALLERGIC RHINITIS 06/23/2007  . ANXIETY DEPRESSION 06/12/2009  . ANXIETY 06/23/2007  . 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 GERD 06/23/2007  . HYPERLIPIDEMIA 06/24/2007  . HYPERTENSION 06/23/2007  . COLONIC POLYPS, HX OF 03/08/2002  . DIVERTICULOSIS, COLON 08/04/2007  . Irritable bowel syndrome 06/23/2007  . Memory dysfunction 06/23/2011  . Cancer ([place])     skin cx/ hx melanoma  . LOW BACK PAIN 06/24/2007  . Alcohol abuse 02/27/2012   Past Surgical History  Procedure Laterality Date  . Cesarean section      x 2  . Colonoscopy    . Appendectomy     Family History  Problem Relation Age of Onset  . Hypertension Other   . Diabetes Other   . Colon cancer Cousin    Social History  Substance Use Topics  . Smoking status: Never Smoker   . Smokeless tobacco: Never Used  . Alcohol Use: 25.2 oz/week    42 Glasses of wine per week   OB History    No data available      Review of Systems  Unable to perform ROS: Dementia      Allergies  Codeine phosphate  Home Medications   Prior to Admission medications   Medication Sig Start Date End Date Taking? Authorizing Provider  amLODipine (NORVASC) 5 MG tablet Take 1 tablet (5 mg total) by mouth daily. 03/17/15   [name], MD  apixaban (ELIQUIS) 5 MG TABS tablet Take 10 mg by mouth 2 (two) times daily. At 0800 and 1700    Historical Provider, MD  divalproex (DEPAKOTE SPRINKLE) 125 MG capsule Take 125 mg by mouth 2 (two) times daily.    Historical Provider, MD  ferrous sulfate 325 (65 FE) MG tablet Take 1 tablet (325 mg total) by mouth 3 (three) times daily after meals. 03/17/15   [name], MD  folic acid (FOLVITE) 1 MG tablet Take 1 tablet (1 mg total) by mouth daily. 03/17/15   [name], MD  HYDROcodone-acetaminophen (NORCO/VICODIN) 5-325 MG per tablet Take 1-2 tablets by mouth every 6 (six) hours as needed for moderate pain. 03/17/15   [name], MD  LORazepam (ATIVAN) 0.5 MG tablet Take 0.5 mg by mouth 2 (two) times daily.    Historical Provider, MD  Multiple Vitamin (MULTIVITAMIN WITH MINERALS) TABS tablet Take 1 tablet by mouth daily. 03/17/15   [name], MD  thiamine 100 MG tablet Take 1 tablet (100  mg total) by mouth daily. 03/17/15   [name], MD   BP 107/57 mmHg  Pulse 79  Temp(Src) 98.2 F (36.8 C) (Oral)  Resp 12  SpO2 97% Physical Exam  Constitutional: She is oriented to person, place, and time. She appears well-developed and well-nourished. No distress.  HENT:  Head: Normocephalic and atraumatic.  Mouth/Throat: Oropharynx is clear and moist.  Laceration versus skin tear to frontal forehead, midline approximately 1 inch behind hairline.  Eyes: Conjunctivae are normal. Pupils are equal, round, and reactive to light. Right eye exhibits no discharge. Left eye exhibits no discharge. No scleral icterus.  Neck: Normal range of motion. Neck supple.   No midline bony tenderness and patient maintains full range of motion of cervical spine.  Cardiovascular: Normal rate, regular rhythm and normal heart sounds.   Pulmonary/Chest: Effort normal and breath sounds normal. No respiratory distress. She has no wheezes. She has no rales.  Abdominal: Soft. There is no tenderness.  Musculoskeletal: She exhibits no tenderness.  Neurological: She is alert and oriented to person, place, and time.  Cranial Nerves II-XII grossly intact. Patient is somewhat verbally responsive and is alert to person, place, situation (unsure of the year) and is able to answer simple questions.  Skin: Skin is warm and dry. No rash noted. She is not diaphoretic.  Psychiatric: She has a normal mood and affect.  Nursing note and vitals reviewed.   ED Course  Procedures (including critical care time)  LACERATION REPAIR Performed by: Verl Dicker Authorized by: Verl Dicker Consent: Verbal consent obtained. Risks and benefits: risks, benefits and alternatives were discussed Consent given by: patient Patient identity confirmed: provided demographic data Prepped and Draped in normal sterile fashion Wound explored  Laceration Location: Frontal scalp behind hairline  Laceration Length: 2cm  No Foreign Bodies seen or palpated  Anesthesia: local infiltration  Local anesthetic: None   Anesthetic total: 0 ml  Irrigation method: syringe Amount of cleaning: standard  Skin closure: Staples   Number of sutures: 4    Patient tolerance: Patient tolerated the procedure well with no immediate complications.  Labs Review Labs Reviewed  CBG MONITORING, ED    Imaging Review Ct Head Wo Contrast  08/21/2015  CLINICAL DATA:  Status post fall out of wheelchair, with laceration at the top of the head. Concern for cervical spine injury. Initial encounter. EXAM: CT HEAD WITHOUT CONTRAST CT CERVICAL SPINE WITHOUT CONTRAST TECHNIQUE: Multidetector CT imaging of  the head and cervical spine was performed following the standard protocol without intravenous contrast. Multiplanar CT image reconstructions of the cervical spine were also generated. COMPARISON:  CT of the head and cervical spine performed 04/08/2015 FINDINGS: CT HEAD FINDINGS There is no evidence of acute infarction, mass lesion, or intra- or extra-axial hemorrhage on CT. Prominence of the ventricles and sulci reflects mild to moderate cortical volume loss. Mild cerebellar atrophy is noted. Scattered periventricular and subcortical white matter change likely reflects small vessel ischemic microangiopathy. The brainstem and fourth ventricle are within normal limits. The basal ganglia are unremarkable in appearance. The cerebral hemispheres demonstrate grossly normal gray-white differentiation. No mass effect or midline shift is seen. There is no evidence of fracture; visualized osseous structures are unremarkable in appearance. The orbits are within normal limits. The paranasal sinuses and mastoid air cells are well-aerated. Extensive soft tissue disruption is noted overlying the right frontoparietal calvarium. CT CERVICAL SPINE FINDINGS There is no evidence of fracture or subluxation. Vertebral bodies demonstrate normal height and alignment.  Mild multilevel disc space narrowing is noted along the cervical spine, with scattered anterior and posterior disc osteophyte complexes. Prevertebral soft tissues are within normal limits. There is chronic deformity involving the medial right clavicle, reflecting remote injury. The thyroid gland is unremarkable in appearance. Patchy atelectasis is noted at the right lung apex. No significant soft tissue abnormalities are seen. IMPRESSION: 1. No evidence of traumatic intracranial injury or fracture. 2. No evidence of fracture or subluxation along the cervical spine. 3. Extensive soft tissue disruption overlying the right frontoparietal calvarium. 4. Mild to moderate cortical  volume loss and scattered small vessel ischemic microangiopathy. 5. Mild degenerative change noted along the cervical spine. 6. Patchy atelectasis at the right lung apex. Electronically Signed   By: Garald Balding M.D.   On: 08/21/2015 07:19   Ct Cervical Spine Wo Contrast  08/21/2015  CLINICAL DATA:  Status post fall out of wheelchair, with laceration at the top of the head. Concern for cervical spine injury. Initial encounter. EXAM: CT HEAD WITHOUT CONTRAST CT CERVICAL SPINE WITHOUT CONTRAST TECHNIQUE: Multidetector CT imaging of the head and cervical spine was performed following the standard protocol without intravenous contrast. Multiplanar CT image reconstructions of the cervical spine were also generated. COMPARISON:  CT of the head and cervical spine performed 04/08/2015 FINDINGS: CT HEAD FINDINGS There is no evidence of acute infarction, mass lesion, or intra- or extra-axial hemorrhage on CT. Prominence of the ventricles and sulci reflects mild to moderate cortical volume loss. Mild cerebellar atrophy is noted. Scattered periventricular and subcortical white matter change likely reflects small vessel ischemic microangiopathy. The brainstem and fourth ventricle are within normal limits. The basal ganglia are unremarkable in appearance. The cerebral hemispheres demonstrate grossly normal gray-white differentiation. No mass effect or midline shift is seen. There is no evidence of fracture; visualized osseous structures are unremarkable in appearance. The orbits are within normal limits. The paranasal sinuses and mastoid air cells are well-aerated. Extensive soft tissue disruption is noted overlying the right frontoparietal calvarium. CT CERVICAL SPINE FINDINGS There is no evidence of fracture or subluxation. Vertebral bodies demonstrate normal height and alignment. Mild multilevel disc space narrowing is noted along the cervical spine, with scattered anterior and posterior disc osteophyte complexes.  Prevertebral soft tissues are within normal limits. There is chronic deformity involving the medial right clavicle, reflecting remote injury. The thyroid gland is unremarkable in appearance. Patchy atelectasis is noted at the right lung apex. No significant soft tissue abnormalities are seen. IMPRESSION: 1. No evidence of traumatic intracranial injury or fracture. 2. No evidence of fracture or subluxation along the cervical spine. 3. Extensive soft tissue disruption overlying the right frontoparietal calvarium. 4. Mild to moderate cortical volume loss and scattered small vessel ischemic microangiopathy. 5. Mild degenerative change noted along the cervical spine. 6. Patchy atelectasis at the right lung apex. Electronically Signed   By: Garald Balding M.D.   On: 08/21/2015 07:19   I have personally reviewed and evaluated these images and lab results as part of my medical decision-making.   EKG Interpretation   Date/Time:  Thursday August 21 2015 07:53:48 EST Ventricular Rate:  81 PR Interval:  165 QRS Duration: 81 QT Interval:  445 QTC Calculation: 517 R Axis:     Text Interpretation:  Sinus rhythm Borderline T abnormalities, anterior  leads Prolonged QT interval since last tracing no significant change  Confirmed by BELFI  MD, MELANIE ([id number]) on 08/21/2015 7:58:17 AM     Meds given in ED:  Medications -  No data to display  New Prescriptions   No medications on file   Filed Vitals:   08/21/15 0624 08/21/15 0720  BP: 111/54 107/57  Pulse: 85 79  Temp:  98.2 F (36.8 C)  TempSrc:  Oral  Resp: 14 12  SpO2: 95% 97%    MDM  CITLALI RUS is a 69 y.o. female with a history of dementia, multiple falls, from rehabilitation facility [facility] comes in for evaluation of acute fall. Patient is on Eliquis and sustained a laceration to her scalp. Patient is somewhat verbally responsive (AAO x3) and she is at baseline per her nursing facility. Will obtain CT head and neck for further  evaluation. CT head and neck is negative for any acute pathology. Laceration was repaired at bedside by myself with 4 staples. Patient remains alert and oriented and at her baseline. EKG is unremarkable. CBG is 84. Call placed to [facility], however no answer. Included in discharge paperwork concern for patient Eliquis therapy with history of dementia and multiple falls and this intervention may need to be reevaluated. Overall, patient appears well, nontoxic, hemodynamically stable with normal vital signs and is appropriate for discharge. Prior to discharge, I discussed and reviewed this case with my attending, Dr. Tamera Punt who agrees with above plan. Final diagnoses:  Fall, initial encounter  Laceration        Comer Locket, PA-C 08/21/15 [id number]  Malvin Johns, MD 09/01/15 1505

## 2015-08-21 NOTE — ED Notes (Signed)
Orange juice given. Pt. Is watching TV. Lunch tray ordered

## 2015-08-21 NOTE — Discharge Instructions (Signed)
Follow your previously written discharge instructions.

## 2015-08-25 ENCOUNTER — Encounter: Payer: Self-pay | Admitting: Internal Medicine

## 2015-08-25 ENCOUNTER — Non-Acute Institutional Stay (SKILLED_NURSING_FACILITY): Payer: Medicare Other | Admitting: Internal Medicine

## 2015-08-25 DIAGNOSIS — S0990XA Unspecified injury of head, initial encounter: Secondary | ICD-10-CM | POA: Insufficient documentation

## 2015-08-25 DIAGNOSIS — S0990XD Unspecified injury of head, subsequent encounter: Secondary | ICD-10-CM | POA: Diagnosis not present

## 2015-08-25 DIAGNOSIS — I10 Essential (primary) hypertension: Secondary | ICD-10-CM | POA: Diagnosis not present

## 2015-08-25 DIAGNOSIS — F4325 Adjustment disorder with mixed disturbance of emotions and conduct: Secondary | ICD-10-CM

## 2015-08-25 DIAGNOSIS — I82519 Chronic embolism and thrombosis of unspecified femoral vein: Secondary | ICD-10-CM

## 2015-08-25 NOTE — Progress Notes (Signed)
Patient ID: ARGYLE CICCONE, female   DOB: [birth year], 69 y.o.   MRN: [id number] MRN: [id number] Name: NYAMAL MIXSON  Sex: female Age: 69 y.o. DOB: 28-Jan-1946  [place] #: [facility] Facility/Room: Level Of Care: SNF Provider: Wille Celeste Emergency Contacts: Extended Emergency Contact Information Primary Emergency Contact: Mangione,Richard Address: [address]          [place] Litter of [place] Phone: [phone] Relation: Son  Code Status:   Allergies: Codeine phosphate  Chief Complaint  Patient presents with  . Acute Visit  . Medical Management of Chronic Issues   Acute visit status post fall on November 24 requiring ER visit with scalp laceration  Medical management of chronic medical issues including history of right leg DVT-mood disorder-history of alcohol abuse-psychosis-hypertension--history right hip fracture   HPI: Patient is 69 y.o. female who is being seen for routine issues of psychosis, DVT and recurrent falls-- She also went to the ER November 24 after sustaining a fall any scalp laceration she currently has staples in place these will be removed by wound care apparently later this week.  Her ER workup CT scan was negative for any acute process and again the laceration was repaired in the ER.  Today she appears to be at her baseline she is ambulating in the wheelchair and visiting with her husband who is also a resident in the facility.  In regards to her psychosis and mood disorder she is followed by psychiatric services-she is now on Seroquel 12.5 mg at 4 PM as well as 50 mg at at bedtime.  Is also on Depakote 375 mg 4 times a day as well as Ativan 0.5 mg 4 times a day.  Currently she is call follow simple verbal commands and cooperative although apparently there still are periods of anxiety and agitation although these apparently have improved significantly.  She does have a history of anemia she is on folate and iron we will  need to update her hemoglobin most recently 8.0 back in August 2016.  I do note she also had a mildly elevated TSH of 6.0 in August we will update this as well I do not see a listed history of hypothyroidism but will have to keep an eye on this.  Patient does have a history of a right hip fracture of her she was not thought to be a good candidate for aggressive intervention and surgery was not performed-this appears to be relatively stable  .  Past Medical History  Diagnosis Date  . MELANOMA, HX OF 08/04/2007  . ALLERGIC RHINITIS 06/23/2007  . ANXIETY DEPRESSION 06/12/2009  . ANXIETY 06/23/2007  . 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 GERD 06/23/2007  . HYPERLIPIDEMIA 06/24/2007  . HYPERTENSION 06/23/2007  . COLONIC POLYPS, HX OF 03/08/2002  . DIVERTICULOSIS, COLON 08/04/2007  . Irritable bowel syndrome 06/23/2007  . Memory dysfunction 06/23/2011  . Cancer ([place])     skin cx/ hx melanoma  . LOW BACK PAIN 06/24/2007  . Alcohol abuse 02/27/2012    Past Surgical History  Procedure Laterality Date  . Cesarean section      x 2  . Colonoscopy    . Appendectomy        Medication List       This list is accurate as of: 08/25/15 11:59 PM.  Always use your most recent med list.               amLODipine 5 MG tablet  Commonly known as:  NORVASC  Take 1 tablet (5 mg total) by mouth daily.     divalproex 125 MG capsule  Commonly known as:  DEPAKOTE SPRINKLE  Take 375 mg by mouth 4 (four) times daily. 250mg  once daily, 375mg  at bedtime     ELIQUIS 5 MG Tabs tablet  Generic drug:  apixaban  Take 10 mg by mouth 2 (two) times daily. At 0800 and 1700     ferrous sulfate 325 (65 FE) MG tablet  Take 1 tablet (325 mg total) by mouth 3 (three) times daily after meals.     folic acid 1 MG tablet  Commonly known as:  FOLVITE  Take 1 tablet (1 mg total) by mouth daily.     HYDROcodone-acetaminophen 5-325 MG tablet  Commonly known as:  NORCO/VICODIN  Take 1-2 tablets by  mouth every 6 (six) hours as needed for moderate pain.     LORazepam 0.5 MG tablet  Commonly known as:  ATIVAN  Take 0.5 mg by mouth 4 (four) times daily -  with meals and at bedtime. 8am, noon, 4pm, 8pm     multivitamin with minerals Tabs tablet  Take 1 tablet by mouth daily.     QUEtiapine 25 MG tablet  Commonly known as:  SEROQUEL  Take 12.5 mg by mouth daily. Take  12.5     QUEtiapine 50 MG tablet  Commonly known as:  SEROQUEL  Take 50 mg by mouth at bedtime. Take 12.5 mg at 4 PM     thiamine 100 MG tablet  Take 1 tablet (100 mg total) by mouth daily.          Immunization History  Administered Date(s) Administered  . Pneumococcal Conjugate-13 07/31/2014  . Td 09/28/1995, 08/20/2008  . Tdap 07/18/2014  . Zoster 01/16/2008    Social History  Substance Use Topics  . Smoking status: Never Smoker   . Smokeless tobacco: Never Used  . Alcohol Use: 25.2 oz/week    42 Glasses of wine per week    Review of Systems UTO from pt - unreliable; per nursing - Appears to have baseline occasionally still have behaviors. There is a history of more so this in late afternoon.  She is not complaining of any shortness of breath or chest pain or musculoskeletal discomfort headache or dizziness  or syncopal-type episodes  Filed Vitals:   08/25/15 2153  BP: 130/70  Pulse: 90  Temp: 97.4 F (36.3 C)  Resp: 20    Physical Exam  GENERAL APPEARANCE: Alert, min  conversant, No acute distress  SKIN: No diaphoresis rash scalp laceration frontal area Staples are in place there is crusting I do not see drainage bleeding or sign of infection-- HEENT: Unremarkable--oropharynx is clear mucous membranes mois t-eyes sclera and conjunctiva are clear pupils appear reactive to light visual acuity appears grossly intact RESPIRATORY: Breathing is even, unlabored. Lung sounds are clear   CARDIOVASCULAR: Heart RRR no murmurs, rubs or gallops. No peripheral edema  GASTROINTESTINAL: Abdomen is  soft, non-tender, not distended w/ normal bowel sounds.  GENITOURINARY: Bladder non tender, not distended  MUSCULOSKELETAL: both legs chronically in flexion at knees--she has some pain with attempts to move her legs but this is not a new complaint I do not see any deformities--moves her upper extremities at baseline NEUROLOGIC: Cranial nerves 2-12 grossly intact. Moves all extremities PSYCHIATRIC: odd, but improved from admission, no behavioral issues  during exams--she was pleasant and cooperative  Patient Active Problem List   Diagnosis Date  Noted  . Closed wound of head 08/25/2015  . Psychosis 08/01/2015  . Mood disorder ([place]) 06/11/2015  . Acute blood loss anemia 04/23/2015  . 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 Right leg swelling 03/21/2015  . DVT, femoral, chronic ([place]) 03/20/2015  . Pressure ulcer 03/14/2015  . Malnutrition of moderate degree ([place]) 03/14/2015  . Closed right hip fracture ([place]) 03/13/2015  . UTI (lower urinary tract infection) 03/13/2015  . Hyperlipidemia 03/13/2015  . Recurrent falls 10/31/2014  . Impaired glucose tolerance 10/31/2014  . Scalp laceration 08/04/2014  . Posterior pain of right hip 12/19/2012  . Urinary incontinence 04/23/2012  . 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 Facial fracture ([facility]) 04/10/2012  . Spinal fracture 04/10/2012  . Abdominal pain, other specified site 03/16/2012  . Alcohol abuse 02/27/2012  . Insomnia 09/17/2011  . Dementia with behavioral disturbance 06/23/2011  . Left shoulder pain 02/09/2011  . Preventative health care 02/01/2011  . ANXIETY DEPRESSION 06/12/2009  . DIZZINESS 06/12/2009  . ELECTROCARDIOGRAM, ABNORMAL 08/20/2008  . 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 DIVERTICULOSIS, COLON 08/04/2007  . MELANOMA, HX OF 08/04/2007  . HYPERLIPIDEMIA 06/24/2007  . LOW BACK PAIN 06/24/2007  . Morbid obesity ([place]) 06/23/2007  . Anxiety state 06/23/2007  . Essential  hypertension 06/23/2007  . ALLERGIC RHINITIS 06/23/2007  . GERD 06/23/2007  . Irritable bowel syndrome 06/23/2007  . POSTMENOPAUSAL STATUS 06/23/2007  . COLONIC POLYPS, HX OF 03/08/2002     Labs.  05/05/2015.  WBC 6.4 hemoglobin 8.0 platelets 271.  Sodium 138 potassium 3.4 BUN 18 creatinine 0.9.  Hemoglobin A1c 4.1.  Albumin 2.7-otherwise liver function tests within normal limits  CBC    Component Value Date/Time   WBC 4.7 03/16/2015 0500   RBC 2.68* 03/16/2015 0500   HGB 8.5* 03/16/2015 0500   HCT 25.5* 03/16/2015 0500   PLT 285 03/16/2015 0500   MCV 95.1 03/16/2015 0500   LYMPHSABS 1.3 03/13/2015 1544   MONOABS 0.9 03/13/2015 1544   EOSABS 0.2 03/13/2015 1544   BASOSABS 0.0 03/13/2015 1544    CMP     Component Value Date/Time   NA 139 03/16/2015 0500   K 3.0* 03/16/2015 0500   CL 109 03/16/2015 0500   CO2 23 03/16/2015 0500   GLUCOSE 89 03/16/2015 0500   GLUCOSE 106* 07/14/2006 0803   BUN 14 03/16/2015 0500   CREATININE 0.71 03/16/2015 0500   CALCIUM 8.3* 03/16/2015 0500   PROT 5.8* 03/15/2015 0500   ALBUMIN 2.3* 03/15/2015 0500   AST 19 03/15/2015 0500   ALT 11* 03/15/2015 0500   ALKPHOS 95 03/15/2015 0500   BILITOT 0.3 03/15/2015 0500   GFRNONAA >60 03/16/2015 0500   GFRAA >60 03/16/2015 0500    Assessment and Plan  #1-history of fall with laceration at this point appears stable I do not see signs of infection of the laceration on the scalp at this point will monitor.  CT was negative for any acute process in the ER neurologically she appears to be stable.  #2 history of hypertension-she is on Norvasc recently pressure 130/70 as see previous list blood pressures 139/70-113/69 this appears to be stable I do see a listed 1 of 168/80 but this appears to be quite unusual.  #3 history of right leg DVT she is on Eliquis there was some suggestion by ER evaluation of need for anticoagulation with her history of falls however it has been less than 6 months  since her DVT so will continue theEliquist this  this was discussed with Dr. Sheppard Coil.  #4 history of right hip fracture this is followed conservatively per surgical evaluation she was not thought to be a good surgical candidate emphasis on conservative treatment and she appears to be stable in this regard.  #5-history of anemia last hemoglobin was 8.0 per chart review she's had quite variable hemoglobins in the past will update this she has been started on iron as well as folate suspect there is an element of chronic disease here.  #6-history of psychosis-she is followed closely by psychiatric services she is now on Seroquel 12.5 mg at 4 PM and 50 mg at daily at bedtime-she is also on Depakote 4 times a day and Ativan 4 times a day as well-apparently her behaviors have stabilized she is pleasant and cooperative in exam today apparently there are  still instances of agitation also will obtain hemoglobin A1c with history of high-risk meds hemoglobin A1c back in August was 4.1. Also will update a Depakote level as well as a metabolic panel with liver function tests     #7-mildly elevated TSH on recent lab will update this as well.  [id number] note greater than 35 minutes spent assessing patient reviewing her chart including ER report-discussion with nursing staff-and coordinating and formulating a plan of care for numerous diagnoses-of note greater than 50% of time spent coordinating plan of care     .           LASSEN, ARLO C,

## 2015-08-29 ENCOUNTER — Non-Acute Institutional Stay (SKILLED_NURSING_FACILITY): Payer: Medicare Other | Admitting: Internal Medicine

## 2015-08-29 ENCOUNTER — Encounter: Payer: Self-pay | Admitting: Internal Medicine

## 2015-08-29 DIAGNOSIS — N182 Chronic kidney disease, stage 2 (mild): Secondary | ICD-10-CM | POA: Diagnosis not present

## 2015-08-29 DIAGNOSIS — F29 Unspecified psychosis not due to a substance or known physiological condition: Secondary | ICD-10-CM | POA: Diagnosis not present

## 2015-08-29 DIAGNOSIS — D638 Anemia in other chronic diseases classified elsewhere: Secondary | ICD-10-CM | POA: Insufficient documentation

## 2015-08-29 NOTE — Assessment & Plan Note (Signed)
Pt's valproic acid level 37; she is on it for mood, it is not a toxic level

## 2015-08-29 NOTE — Progress Notes (Signed)
MRN: [id number] Name: Tara Shaffer  Sex: female Age: 69 y.o. DOB: 09/10/1946  [place] #:[facility]  Facility/Room: Level Of Care: SNF Provider: Inocencio Homes D Emergency Contacts: Extended Emergency Contact Information Primary Emergency Contact: Carrillo,Richard Address: [place]          [place] Litter of [place] Phone: [phone] Relation: Son  Code Status:   Allergies: Codeine phosphate  Chief Complaint  Patient presents with  . Medical Management of Chronic Issues    HPI: Patient is 69 y.o. female with ETOH abuse prior, dementia, HTN, HLD DVT who is being seen for routine issues of anemia, CKD and psychosis.  Past Medical History  Diagnosis Date  . MELANOMA, HX OF 08/04/2007  . ALLERGIC RHINITIS 06/23/2007  . ANXIETY DEPRESSION 06/12/2009  . ANXIETY 06/23/2007  . 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 GERD 06/23/2007  . HYPERLIPIDEMIA 06/24/2007  . HYPERTENSION 06/23/2007  . COLONIC POLYPS, HX OF 03/08/2002  . DIVERTICULOSIS, COLON 08/04/2007  . Irritable bowel syndrome 06/23/2007  . Memory dysfunction 06/23/2011  . Cancer ([place])     skin cx/ hx melanoma  . LOW BACK PAIN 06/24/2007  . Alcohol abuse 02/27/2012    Past Surgical History  Procedure Laterality Date  . Cesarean section      x 2  . Colonoscopy    . Appendectomy        Medication List       This list is accurate as of: 08/29/15 11:59 PM.  Always use your most recent med list.               amLODipine 5 MG tablet  Commonly known as:  NORVASC  Take 1 tablet (5 mg total) by mouth daily.     divalproex 125 MG capsule  Commonly known as:  DEPAKOTE SPRINKLE  Take 375 mg by mouth 4 (four) times daily. 250mg  once daily, 375mg  at bedtime     ELIQUIS 5 MG Tabs tablet  Generic drug:  apixaban  Take 10 mg by mouth 2 (two) times daily. At 0800 and 1700     ferrous sulfate 325 (65 FE) MG tablet  Take 1 tablet (325 mg total) by mouth 3 (three) times daily  after meals.     folic acid 1 MG tablet  Commonly known as:  FOLVITE  Take 1 tablet (1 mg total) by mouth daily.     HYDROcodone-acetaminophen 5-325 MG tablet  Commonly known as:  NORCO/VICODIN  Take 1-2 tablets by mouth every 6 (six) hours as needed for moderate pain.     LORazepam 0.5 MG tablet  Commonly known as:  ATIVAN  Take 0.5 mg by mouth 4 (four) times daily -  with meals and at bedtime. 8am, noon, 4pm, 8pm     multivitamin with minerals Tabs tablet  Take 1 tablet by mouth daily.     QUEtiapine 25 MG tablet  Commonly known as:  SEROQUEL  Take 12.5 mg by mouth daily. Take  12.5     QUEtiapine 50 MG tablet  Commonly known as:  SEROQUEL  Take 50 mg by mouth at bedtime. Take 12.5 mg at 4 PM     thiamine 100 MG tablet  Take 1 tablet (100 mg total) by mouth daily.        No orders of the defined types were placed in this encounter.    Immunization History  Administered Date(s) Administered  . Pneumococcal Conjugate-13 07/31/2014  . Td 09/28/1995, 08/20/2008  .  Tdap 07/18/2014  . Zoster 01/16/2008    Social History  Substance Use Topics  . Smoking status: Never Smoker   . Smokeless tobacco: Never Used  . Alcohol Use: 25.2 oz/week    42 Glasses of wine per week    Review of Systems  UTO 2/ dementia; nursing without concerns   Filed Vitals:   08/29/15 1147  BP: 119/69  Pulse: 68  Temp: 97.1 F (36.2 C)  Resp: 16    Physical Exam  GENERAL APPEARANCE: Alert, min conversant, No acute distress  SKIN: No diaphoresis rash HEENT: Unremarkable RESPIRATORY: Breathing is even, unlabored. Lung sounds are clear   CARDIOVASCULAR: Heart RRR no murmurs, rubs or gallops. No peripheral edema  GASTROINTESTINAL: Abdomen is soft, non-tender, not distended w/ normal bowel sounds.  GENITOURINARY: Bladder non tender, not distended  MUSCULOSKELETAL: B knees with flexion contractures NEUROLOGIC: Cranial nerves 2-12 grossly intact. Moves Upper extremities PSYCHIATRIC:  dementia, oddd, occ behavioral issues  Patient Active Problem List   Diagnosis Date Noted  . Anemia of chronic disease 08/29/2015  . CKD (chronic kidney disease) stage 2, GFR 60-89 ml/min 08/29/2015  . Closed wound of head 08/25/2015  . Psychosis 08/01/2015  . Mood disorder ([place]) 06/11/2015  . Acute blood loss anemia 04/23/2015  . 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 Right leg swelling 03/21/2015  . DVT, femoral, chronic ([place]) 03/20/2015  . Pressure ulcer 03/14/2015  . Malnutrition of moderate degree ([place]) 03/14/2015  . Closed right hip fracture ([place]) 03/13/2015  . UTI (lower urinary tract infection) 03/13/2015  . Hyperlipidemia 03/13/2015  . Recurrent falls 10/31/2014  . Impaired glucose tolerance 10/31/2014  . Scalp laceration 08/04/2014  . Posterior pain of right hip 12/19/2012  . Urinary incontinence 04/23/2012  . 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 Facial fracture ([place]) 04/10/2012  . Spinal fracture 04/10/2012  . Abdominal pain, other specified site 03/16/2012  . Alcohol abuse 02/27/2012  . Insomnia 09/17/2011  . Dementia with behavioral disturbance 06/23/2011  . Left shoulder pain 02/09/2011  . Preventative health care 02/01/2011  . ANXIETY DEPRESSION 06/12/2009  . DIZZINESS 06/12/2009  . ELECTROCARDIOGRAM, ABNORMAL 08/20/2008  . 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 DIVERTICULOSIS, COLON 08/04/2007  . MELANOMA, HX OF 08/04/2007  . HYPERLIPIDEMIA 06/24/2007  . LOW BACK PAIN 06/24/2007  . Morbid obesity ([place]) 06/23/2007  . Anxiety state 06/23/2007  . Essential hypertension 06/23/2007  . ALLERGIC RHINITIS 06/23/2007  . GERD 06/23/2007  . Irritable bowel syndrome 06/23/2007  . POSTMENOPAUSAL STATUS 06/23/2007  . COLONIC POLYPS, HX OF 03/08/2002    CBC    Component Value Date/Time   WBC 4.7 03/16/2015 0500   RBC 2.68* 03/16/2015 0500   HGB 8.5* 03/16/2015 0500   HCT 25.5* 03/16/2015 0500   PLT 285 03/16/2015  0500   MCV 95.1 03/16/2015 0500   LYMPHSABS 1.3 03/13/2015 1544   MONOABS 0.9 03/13/2015 1544   EOSABS 0.2 03/13/2015 1544   BASOSABS 0.0 03/13/2015 1544    CMP     Component Value Date/Time   NA 139 03/16/2015 0500   K 3.0* 03/16/2015 0500   CL 109 03/16/2015 0500   CO2 23 03/16/2015 0500   GLUCOSE 89 03/16/2015 0500   GLUCOSE 106* 07/14/2006 0803   BUN 14 03/16/2015 0500   CREATININE 0.71 03/16/2015 0500   CALCIUM 8.3* 03/16/2015 0500   PROT 5.8* 03/15/2015 0500   ALBUMIN 2.3* 03/15/2015 0500   AST 19 03/15/2015 0500   ALT 11* 03/15/2015 0500  ALKPHOS 95 03/15/2015 0500   BILITOT 0.3 03/15/2015 0500   GFRNONAA >60 03/16/2015 0500   GFRAA >60 03/16/2015 0500    Assessment and Plan  Anemia of chronic disease 08/26/15 Hb 7.9 which is basically unchanged from 02/2015; since at SNF she has been on iron , folate and thiamine; from this I can infer that either b12 is the problem or her BM not working from prior ETOH abuse; will obtain B12 lab and start B12  1000 mcg injection  CKD (chronic kidney disease) stage 2, GFR 60-89 ml/min GFR stable at 71, 62, 72, will monitor periodically  Psychosis Pt's valproic acid level 37; she is on it for mood, it is not a toxic level    Hennie Duos, MD

## 2015-08-29 NOTE — Assessment & Plan Note (Signed)
08/26/15 Hb 7.9 which is basically unchanged from 02/2015; since at SNF she has been on iron , folate and thiamine; from this I can infer that either b12 is the problem or her BM not working from prior ETOH abuse; will obtain B12 lab and start B12  1000 mcg injection

## 2015-08-29 NOTE — Assessment & Plan Note (Signed)
GFR stable at 71, 62, 72, will monitor periodically

## 2015-09-04 ENCOUNTER — Non-Acute Institutional Stay (SKILLED_NURSING_FACILITY): Payer: Medicare Other | Admitting: Internal Medicine

## 2015-09-04 ENCOUNTER — Encounter: Payer: Self-pay | Admitting: Internal Medicine

## 2015-09-04 DIAGNOSIS — D638 Anemia in other chronic diseases classified elsewhere: Secondary | ICD-10-CM

## 2015-09-04 DIAGNOSIS — F1010 Alcohol abuse, uncomplicated: Secondary | ICD-10-CM

## 2015-09-04 NOTE — Progress Notes (Signed)
Patient ID: WRENLEE MULRONEY, female   DOB: Oct 19, 1945, 69 y.o.   MRN: [id number] MRN: [id number] Name: Tara Shaffer  Sex: female Age: 69 y.o. DOB: April 01, 1946  [facility] #:[facility]  Facility/Room: Level Of Care: SNF Provider: Wille Celeste Emergency Contacts: Extended Emergency Contact Information Primary Emergency Contact: Schroepfer,Richard Address: [place]          [address] [place] [phone] Johnnette Litter of [place] Phone: [phone] Relation: Son  Code Status:   Allergies: Codeine phosphate  Chief Complaint  Patient presents with  . Acute Visit   follow-up anemia  HPI: Patient is 69 y.o. female with ETOH abuse prior, dementia, HTN, HLD DVT AS WELL as anemi Recent hemoglobin on November 29 was 7.9 which is comparable to what it was back in [place] did assess her and started her on B12 supplementation secondary to concerns or may be B12 deficiency with a history of alcoholism-she is on folate and thiamine  And iron as well as.  Lab work shows that B12 actually is within normal limits at greater than 1500 folate is 19.8 which is within normal limits very 10 is 141 iron continues to be low at 38.  Clinically she appears to be stable does have a history of psychosis which continues to be an issue at times she is followed by psychiatric services.  Her vital signs are stable she appears to be at baseline today she is actually in her husband's room visiting.  .    Past Medical History  Diagnosis Date  . MELANOMA, HX OF 08/04/2007  . ALLERGIC RHINITIS 06/23/2007  . ANXIETY DEPRESSION 06/12/2009  . ANXIETY 06/23/2007  . 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 GERD 06/23/2007  . HYPERLIPIDEMIA 06/24/2007  . HYPERTENSION 06/23/2007  . COLONIC POLYPS, HX OF 03/08/2002  . DIVERTICULOSIS, COLON 08/04/2007  . Irritable bowel syndrome 06/23/2007  . Memory dysfunction 06/23/2011  . Cancer ([address])     skin cx/ hx melanoma  . LOW BACK PAIN 06/24/2007  .  Alcohol abuse 02/27/2012    Past Surgical History  Procedure Laterality Date  . Cesarean section      x 2  . Colonoscopy    . Appendectomy        Medication List       This list is accurate as of: 09/04/15 11:59 PM.  Always use your most recent med list.               amLODipine 5 MG tablet  Commonly known as:  NORVASC  Take 1 tablet (5 mg total) by mouth daily.     divalproex 125 MG capsule  Commonly known as:  DEPAKOTE SPRINKLE  Take 375 mg by mouth 4 (four) times daily. 250mg  once daily, 375mg  at bedtime     ELIQUIS 5 MG Tabs tablet  Generic drug:  apixaban  Take 10 mg by mouth 2 (two) times daily. At 0800 and 1700     ferrous sulfate 325 (65 FE) MG tablet  Take 1 tablet (325 mg total) by mouth 3 (three) times daily after meals.     folic acid 1 MG tablet  Commonly known as:  FOLVITE  Take 1 tablet (1 mg total) by mouth daily.     HYDROcodone-acetaminophen 5-325 MG tablet  Commonly known as:  NORCO/VICODIN  Take 1-2 tablets by mouth every 6 (six) hours as needed for moderate pain.     LORazepam 0.5 MG tablet  Commonly known as:  ATIVAN  Take 0.5 mg by mouth 4 (four) times daily -  with meals and at bedtime. 8am, noon, 4pm, 8pm     multivitamin with minerals Tabs tablet  Take 1 tablet by mouth daily.     QUEtiapine 25 MG tablet  Commonly known as:  SEROQUEL  Take 12.5 mg by mouth daily. Take  12.5     QUEtiapine 50 MG tablet  Commonly known as:  SEROQUEL  Take 50 mg by mouth at bedtime. Take 12.5 mg at 4 PM     thiamine 100 MG tablet  Take 1 tablet (100 mg total) by mouth daily.        No orders of the defined types were placed in this encounter.    Immunization History  Administered Date(s) Administered  . Pneumococcal Conjugate-13 07/31/2014  . Td 09/28/1995, 08/20/2008  . Tdap 07/18/2014  . Zoster 01/16/2008    Social History  Substance Use Topics  . Smoking status: Never Smoker   . Smokeless tobacco: Never Used  . Alcohol Use: 25.2  oz/week    42 Glasses of wine per week    Review of Systems  UTO 2/ dementia; nursing without concerns--does not complain of pain shortness of breath or increased weakness   Filed Vitals:   09/04/15 2320  BP: 110/65  Pulse: 74  Temp: 97.1 F (36.2 C)  Resp: 16    Physical Exam  GENERAL APPEARANCE: Alert,  conversant, No acute distress  SKIN: No diaphoresis rash HEENT: Unremarkable RESPIRATORY: Breathing is even, unlabored. Lung sounds are clear   CARDIOVASCULAR: Heart RRR no murmurs, rubs or gallops. Mild  peripheral edema  GASTROINTESTINAL: Abdomen is soft, non-tender, not distended w/ normal bowel sounds.     MUSCULOSKELETAL: B knees with flexion contractures NEUROLOGIC: Cranial nerves 2-12 grossly intact. Moves Upper extremities PSYCHIATRIC: dementia, , occ behavioral issues pleasant and somewhat conversant tonight  Patient Active Problem List   Diagnosis Date Noted  . Anemia of chronic disease 08/29/2015  . CKD (chronic kidney disease) stage 2, GFR 60-89 ml/min 08/29/2015  . Closed wound of head 08/25/2015  . Psychosis 08/01/2015  . Mood disorder ([place]) 06/11/2015  . Acute blood loss anemia 04/23/2015  . 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 Right leg swelling 03/21/2015  . DVT, femoral, chronic ([place]) 03/20/2015  . Pressure ulcer 03/14/2015  . Malnutrition of moderate degree ([place]) 03/14/2015  . Closed right hip fracture ([place]) 03/13/2015  . UTI (lower urinary tract infection) 03/13/2015  . Hyperlipidemia 03/13/2015  . Recurrent falls 10/31/2014  . Impaired glucose tolerance 10/31/2014  . Scalp laceration 08/04/2014  . Posterior pain of right hip 12/19/2012  . Urinary incontinence 04/23/2012  . 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 Facial fracture ([place]) 04/10/2012  . Spinal fracture 04/10/2012  . Abdominal pain, other specified site 03/16/2012  . Alcohol abuse 02/27/2012  . Insomnia 09/17/2011  . Dementia with behavioral  disturbance 06/23/2011  . Left shoulder pain 02/09/2011  . Preventative health care 02/01/2011  . ANXIETY DEPRESSION 06/12/2009  . DIZZINESS 06/12/2009  . ELECTROCARDIOGRAM, ABNORMAL 08/20/2008  . 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 DIVERTICULOSIS, COLON 08/04/2007  . MELANOMA, HX OF 08/04/2007  . HYPERLIPIDEMIA 06/24/2007  . LOW BACK PAIN 06/24/2007  . Morbid obesity ([place]) 06/23/2007  . Anxiety state 06/23/2007  . Essential hypertension 06/23/2007  . ALLERGIC RHINITIS 06/23/2007  . GERD 06/23/2007  . Irritable bowel syndrome 06/23/2007  . POSTMENOPAUSAL STATUS 06/23/2007  . COLONIC POLYPS, HX OF 03/08/2002  Labs.  09/01/2015.  Iron low at 38-ferritin within normal limits at 141.1-folate within normal limits at 19.8 and B12 is greater than 1500.  08/26/2015.  WBC 5.5 hemoglobin 7.9 platelets 223.   CBC    Component Value Date/Time   WBC 4.7 03/16/2015 0500   RBC 2.68* 03/16/2015 0500   HGB 8.5* 03/16/2015 0500   HCT 25.5* 03/16/2015 0500   PLT 285 03/16/2015 0500   MCV 95.1 03/16/2015 0500   LYMPHSABS 1.3 03/13/2015 1544   MONOABS 0.9 03/13/2015 1544   EOSABS 0.2 03/13/2015 1544   BASOSABS 0.0 03/13/2015 1544    CMP     Component Value Date/Time   NA 139 03/16/2015 0500   K 3.0* 03/16/2015 0500   CL 109 03/16/2015 0500   CO2 23 03/16/2015 0500   GLUCOSE 89 03/16/2015 0500   GLUCOSE 106* 07/14/2006 0803   BUN 14 03/16/2015 0500   CREATININE 0.71 03/16/2015 0500   CALCIUM 8.3* 03/16/2015 0500   PROT 5.8* 03/15/2015 0500   ALBUMIN 2.3* 03/15/2015 0500   AST 19 03/15/2015 0500   ALT 11* 03/15/2015 0500   ALKPHOS 95 03/15/2015 0500   BILITOT 0.3 03/15/2015 0500   GFRNONAA >60 03/16/2015 0500   GFRAA >60 03/16/2015 0500    Assessment and Plan  Assessment and plan.  #1 anemia-recent labs show normal folate and B12 iron continues to be low-there is some thought by Dr. Sheppard Coil this may be bone marrow insufficiency because of  her alcohol abuse-I suspect this may be the case per recent labs-she is on B12-we'll discuss with Dr. Sheppard Coil whether this is needed at this point since her level was normal.  Clinically she appears to be stable.  [id number]    LASSEN, ARLO C,

## 2015-10-03 ENCOUNTER — Non-Acute Institutional Stay (SKILLED_NURSING_FACILITY): Payer: Medicare Other | Admitting: Internal Medicine

## 2015-10-03 DIAGNOSIS — F03918 Unspecified dementia, unspecified severity, with other behavioral disturbance: Secondary | ICD-10-CM

## 2015-10-03 DIAGNOSIS — F0391 Unspecified dementia with behavioral disturbance: Secondary | ICD-10-CM | POA: Diagnosis not present

## 2015-10-03 DIAGNOSIS — I10 Essential (primary) hypertension: Secondary | ICD-10-CM | POA: Diagnosis not present

## 2015-10-03 DIAGNOSIS — F39 Unspecified mood [affective] disorder: Secondary | ICD-10-CM | POA: Diagnosis not present

## 2015-10-06 ENCOUNTER — Encounter: Payer: Self-pay | Admitting: Internal Medicine

## 2015-10-06 NOTE — Progress Notes (Signed)
MRN: [id number] Name: Tara Shaffer  Sex: female Age: 70 y.o. DOB: 11-22-1945  [place] #: [facility] Facility/Room: Level Of Care: SNF Provider: Inocencio Homes D Emergency Contacts: Extended Emergency Contact Information Primary Emergency Contact: Warth,Richard Address: [place]          [place] Johnnette Litter of [place] Phone: [phone] Relation: Son  Code Status:   Allergies: Codeine phosphate  Chief Complaint  Patient presents with  . Medical Management of Chronic Issues    HPI: Patient is 70 y.o. female with ETOH abuse prior, dementia, HTN, HLD DVT who is being seen for routine issues of HTN,Dementia and mood disorder.   Past Medical History  Diagnosis Date  . MELANOMA, HX OF 08/04/2007  . ALLERGIC RHINITIS 06/23/2007  . ANXIETY DEPRESSION 06/12/2009  . ANXIETY 06/23/2007  . 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 GERD 06/23/2007  . HYPERLIPIDEMIA 06/24/2007  . HYPERTENSION 06/23/2007  . COLONIC POLYPS, HX OF 03/08/2002  . DIVERTICULOSIS, COLON 08/04/2007  . Irritable bowel syndrome 06/23/2007  . Memory dysfunction 06/23/2011  . Cancer ([place])     skin cx/ hx melanoma  . LOW BACK PAIN 06/24/2007  . Alcohol abuse 02/27/2012    Past Surgical History  Procedure Laterality Date  . Cesarean section      x 2  . Colonoscopy    . Appendectomy        Medication List       This list is accurate as of: 10/03/15 11:59 PM.  Always use your most recent med list.               amLODipine 5 MG tablet  Commonly known as:  NORVASC  Take 1 tablet (5 mg total) by mouth daily.     divalproex 125 MG capsule  Commonly known as:  DEPAKOTE SPRINKLE  Take 375 mg by mouth 4 (four) times daily. 250mg  once daily, 375mg  at bedtime     ELIQUIS 5 MG Tabs tablet  Generic drug:  apixaban  Take 10 mg by mouth 2 (two) times daily. At 0800 and 1700     ferrous sulfate 325 (65 FE) MG tablet  Take 1 tablet (325 mg total) by mouth 3 (three) times  daily after meals.     folic acid 1 MG tablet  Commonly known as:  FOLVITE  Take 1 tablet (1 mg total) by mouth daily.     HYDROcodone-acetaminophen 5-325 MG tablet  Commonly known as:  NORCO/VICODIN  Take 1-2 tablets by mouth every 6 (six) hours as needed for moderate pain.     LORazepam 0.5 MG tablet  Commonly known as:  ATIVAN  Take 0.5 mg by mouth 4 (four) times daily -  with meals and at bedtime. 8am, noon, 4pm, 8pm     multivitamin with minerals Tabs tablet  Take 1 tablet by mouth daily.     QUEtiapine 25 MG tablet  Commonly known as:  SEROQUEL  Take 12.5 mg by mouth daily. Take  12.5     QUEtiapine 50 MG tablet  Commonly known as:  SEROQUEL  Take 50 mg by mouth at bedtime. Take 12.5 mg at 4 PM     thiamine 100 MG tablet  Take 1 tablet (100 mg total) by mouth daily.        No orders of the defined types were placed in this encounter.    Immunization History  Administered Date(s) Administered  . Pneumococcal Conjugate-13 07/31/2014  . Td 09/28/1995, 08/20/2008  .  Tdap 07/18/2014  . Zoster 01/16/2008    Social History  Substance Use Topics  . Smoking status: Never Smoker   . Smokeless tobacco: Never Used  . Alcohol Use: 25.2 oz/week    42 Glasses of wine per week    Review of Systems  UTO 2/2 dementia;pt denies problems; nursing without concerns    Filed Vitals:   10/06/15 1806  BP: 137/81  Pulse: 72  Temp: 97 F (36.1 C)  Resp: 18    Physical Exam  GENERAL APPEARANCE: Alert, min conversant, No acute distressWF in WC in hall  SKIN: No diaphoresis rash HEENT: Unremarkable RESPIRATORY: Breathing is even, unlabored. Lung sounds are clear   CARDIOVASCULAR: Heart RRR no murmurs, rubs or gallops. No peripheral edema  GASTROINTESTINAL: Abdomen is soft, non-tender, not distended w/ normal bowel sounds.  GENITOURINARY: Bladder non tender, not distended  MUSCULOSKELETAL: knees in permanent flexion NEUROLOGIC: Cranial nerves 2-12 grossly intact. Moves  all extremities PSYCHIATRIC: odd affect, no behavioral issues  Patient Active Problem List   Diagnosis Date Noted  . Anemia of chronic disease 08/29/2015  . CKD (chronic kidney disease) stage 2, GFR 60-89 ml/min 08/29/2015  . Closed wound of head 08/25/2015  . Psychosis 08/01/2015  . Mood disorder ([place]) 06/11/2015  . Acute blood loss anemia 04/23/2015  . 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 Right leg swelling 03/21/2015  . DVT, femoral, chronic ([place]) 03/20/2015  . Pressure ulcer 03/14/2015  . Malnutrition of moderate degree ([place]) 03/14/2015  . Closed right hip fracture ([place]) 03/13/2015  . UTI (lower urinary tract infection) 03/13/2015  . Hyperlipidemia 03/13/2015  . Recurrent falls 10/31/2014  . Impaired glucose tolerance 10/31/2014  . Scalp laceration 08/04/2014  . Posterior pain of right hip 12/19/2012  . Urinary incontinence 04/23/2012  . 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 Facial fracture ([place]) 04/10/2012  . Spinal fracture 04/10/2012  . Abdominal pain, other specified site 03/16/2012  . Alcohol abuse 02/27/2012  . Insomnia 09/17/2011  . Dementia with behavioral disturbance 06/23/2011  . Left shoulder pain 02/09/2011  . Preventative health care 02/01/2011  . ANXIETY DEPRESSION 06/12/2009  . DIZZINESS 06/12/2009  . ELECTROCARDIOGRAM, ABNORMAL 08/20/2008  . 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 DIVERTICULOSIS, COLON 08/04/2007  . MELANOMA, HX OF 08/04/2007  . HYPERLIPIDEMIA 06/24/2007  . LOW BACK PAIN 06/24/2007  . Morbid obesity ([place]) 06/23/2007  . Anxiety state 06/23/2007  . Essential hypertension 06/23/2007  . ALLERGIC RHINITIS 06/23/2007  . GERD 06/23/2007  . Irritable bowel syndrome 06/23/2007  . POSTMENOPAUSAL STATUS 06/23/2007  . COLONIC POLYPS, HX OF 03/08/2002    CBC    Component Value Date/Time   WBC 4.7 03/16/2015 0500   RBC 2.68* 03/16/2015 0500   HGB 8.5* 03/16/2015 0500   HCT 25.5* 03/16/2015  0500   PLT 285 03/16/2015 0500   MCV 95.1 03/16/2015 0500   LYMPHSABS 1.3 03/13/2015 1544   MONOABS 0.9 03/13/2015 1544   EOSABS 0.2 03/13/2015 1544   BASOSABS 0.0 03/13/2015 1544    CMP     Component Value Date/Time   NA 139 03/16/2015 0500   K 3.0* 03/16/2015 0500   CL 109 03/16/2015 0500   CO2 23 03/16/2015 0500   GLUCOSE 89 03/16/2015 0500   GLUCOSE 106* 07/14/2006 0803   BUN 14 03/16/2015 0500   CREATININE 0.71 03/16/2015 0500   CALCIUM 8.3* 03/16/2015 0500   PROT 5.8* 03/15/2015 0500   ALBUMIN 2.3* 03/15/2015 0500   AST 19 03/15/2015 0500  ALT 11* 03/15/2015 0500   ALKPHOS 95 03/15/2015 0500   BILITOT 0.3 03/15/2015 0500   GFRNONAA >60 03/16/2015 0500   GFRAA >60 03/16/2015 0500    Assessment and Plan  Essential hypertension Well controlled on norvasc 5 mg daily;plan - cont current med  Dementia with behavioral disturbance Totally new regimen working well;depakote has been increased to 375 QID and seroquel started; Librium replaced with ativan; pt is much more calm fewer falls and much better still since moving in the same room as her husband; plan - cont current meds   Mood disorder Improved with increased depakote and seroquel instead of risperdal; doing very well;plan -cont current regimen    Hennie Duos, MD

## 2015-10-06 NOTE — Assessment & Plan Note (Signed)
Totally new regimen working well;depakote has been increased to 375 QID and seroquel started; Librium replaced with ativan; pt is much more calm fewer falls and much better still since moving in the same room as her husband; plan - cont current meds

## 2015-10-06 NOTE — Assessment & Plan Note (Signed)
Well controlled on norvasc 5 mg daily;plan - cont current med

## 2015-10-06 NOTE — Assessment & Plan Note (Signed)
Improved with increased depakote and seroquel instead of risperdal; doing very well;plan -cont current regimen

## 2015-11-10 ENCOUNTER — Encounter (HOSPITAL_COMMUNITY): Payer: Self-pay

## 2015-11-10 ENCOUNTER — Encounter: Payer: Self-pay | Admitting: Internal Medicine

## 2015-11-10 ENCOUNTER — Non-Acute Institutional Stay (SKILLED_NURSING_FACILITY): Payer: Medicare Other | Admitting: Internal Medicine

## 2015-11-10 ENCOUNTER — Emergency Department (HOSPITAL_COMMUNITY)
Admission: EM | Admit: 2015-11-10 | Discharge: 2015-11-11 | Disposition: A | Discharge status: Home / Self Care | Payer: Medicare Other | Attending: Emergency Medicine | Admitting: Emergency Medicine

## 2015-11-10 DIAGNOSIS — Z043 Encounter for examination and observation following other accident: Secondary | ICD-10-CM | POA: Diagnosis present

## 2015-11-10 DIAGNOSIS — F03918 Unspecified dementia, unspecified severity, with other behavioral disturbance: Secondary | ICD-10-CM

## 2015-11-10 DIAGNOSIS — Z8739 Personal history of other diseases of the musculoskeletal system and connective tissue: Secondary | ICD-10-CM | POA: Insufficient documentation

## 2015-11-10 DIAGNOSIS — I10 Essential (primary) hypertension: Secondary | ICD-10-CM

## 2015-11-10 DIAGNOSIS — F0391 Unspecified dementia with behavioral disturbance: Secondary | ICD-10-CM | POA: Diagnosis not present

## 2015-11-10 DIAGNOSIS — F329 Major depressive disorder, single episode, unspecified: Secondary | ICD-10-CM | POA: Diagnosis not present

## 2015-11-10 DIAGNOSIS — Y92128 Other place in nursing home as the place of occurrence of the external cause: Secondary | ICD-10-CM | POA: Diagnosis not present

## 2015-11-10 DIAGNOSIS — Y998 Other external cause status: Secondary | ICD-10-CM | POA: Diagnosis not present

## 2015-11-10 DIAGNOSIS — W19XXXA Unspecified fall, initial encounter: Secondary | ICD-10-CM

## 2015-11-10 DIAGNOSIS — Z8719 Personal history of other diseases of the digestive system: Secondary | ICD-10-CM | POA: Diagnosis not present

## 2015-11-10 DIAGNOSIS — Z8639 Personal history of other endocrine, nutritional and metabolic disease: Secondary | ICD-10-CM | POA: Diagnosis not present

## 2015-11-10 DIAGNOSIS — G8929 Other chronic pain: Secondary | ICD-10-CM | POA: Insufficient documentation

## 2015-11-10 DIAGNOSIS — I82519 Chronic embolism and thrombosis of unspecified femoral vein: Secondary | ICD-10-CM | POA: Diagnosis not present

## 2015-11-10 DIAGNOSIS — Z86010 Personal history of colonic polyps: Secondary | ICD-10-CM | POA: Diagnosis not present

## 2015-11-10 DIAGNOSIS — Z79899 Other long term (current) drug therapy: Secondary | ICD-10-CM | POA: Diagnosis not present

## 2015-11-10 DIAGNOSIS — F419 Anxiety disorder, unspecified: Secondary | ICD-10-CM | POA: Diagnosis not present

## 2015-11-10 DIAGNOSIS — Z85820 Personal history of malignant melanoma of skin: Secondary | ICD-10-CM | POA: Insufficient documentation

## 2015-11-10 DIAGNOSIS — Y9389 Activity, other specified: Secondary | ICD-10-CM | POA: Insufficient documentation

## 2015-11-10 DIAGNOSIS — Z7901 Long term (current) use of anticoagulants: Secondary | ICD-10-CM | POA: Insufficient documentation

## 2015-11-10 DIAGNOSIS — F0390 Unspecified dementia without behavioral disturbance: Secondary | ICD-10-CM | POA: Insufficient documentation

## 2015-11-10 DIAGNOSIS — W06XXXA Fall from bed, initial encounter: Secondary | ICD-10-CM | POA: Diagnosis not present

## 2015-11-10 DIAGNOSIS — N390 Urinary tract infection, site not specified: Secondary | ICD-10-CM | POA: Insufficient documentation

## 2015-11-10 NOTE — Progress Notes (Signed)
Patient ID: NEVAYAH FERSCH, female   DOB: [birth year], 70 y.o.   MRN: [id number] MRN: [id number] Name: EMILENE STARRETT  Sex: female Age: 70 y.o. DOB: 11-08-1945  [place] #:[facility]  Facility/Room: Level Of Care: SNF Provider: Wille Celeste Emergency Contacts: Extended Emergency Contact Information Primary Emergency Contact: Melka,Richard Address: [place]          [place] [phone] Johnnette Litter of [place] Phone: [phone] Relation: Son  Code Status:   Allergies: Codeine phosphate  Chief Complaint  Patient presents with  . Discharge Note    HPI: Patient is 70 y.o. female with ETOH abuse prior, dementia, HTN, HLD DVT who is being seen for discharge    Her dementia-psychosis appears to be under much better control since medication changes were made she is now on Depakote-Seroquel-and Librium.  She will be going to an assisted living facility-she had been in the same room with her husband here.  Apparently she has been deemed not appropriate for skilled nursing and  prompting the switch to assisted living.-- largely ambulates in a wheelchair  She does have a history of nonoperative right hip fracture and had a right leg DVT in late June 2016 she continues on Eliquis for anticoagulation will defer decision on continuation of this to her new primary care provider since she is about to leave the facility  Past Medical History  Diagnosis Date  . MELANOMA, HX OF 08/04/2007  . ALLERGIC RHINITIS 06/23/2007  . ANXIETY DEPRESSION 06/12/2009  . ANXIETY 06/23/2007  . 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 GERD 06/23/2007  . HYPERLIPIDEMIA 06/24/2007  . HYPERTENSION 06/23/2007  . COLONIC POLYPS, HX OF 03/08/2002  . DIVERTICULOSIS, COLON 08/04/2007  . Irritable bowel syndrome 06/23/2007  . Memory dysfunction 06/23/2011  . Cancer ([place])     skin cx/ hx melanoma  . LOW BACK PAIN 06/24/2007  . Alcohol abuse 02/27/2012    Past Surgical History  Procedure  Laterality Date  . Cesarean section      x 2  . Colonoscopy    . Appendectomy        Medication List       This list is accurate as of: 11/10/15 11:11 PM.  Always use your most recent med list.               AMBULATORY NON FORMULARY MEDICATION  Magic Cup One cup at lunchtime for weight loss     amLODipine 5 MG tablet  Commonly known as:  NORVASC  Take 1 tablet (5 mg total) by mouth daily.     chlordiazePOXIDE 5 MG capsule  Commonly known as:  LIBRIUM  Take one tablet by mouth at 8am,12noon, and 4pm for anxiety     divalproex 125 MG capsule  Commonly known as:  DEPAKOTE SPRINKLE  Take two tablets by mouth twice daily     ELIQUIS 5 MG Tabs tablet  Generic drug:  apixaban  Take 10 mg by mouth 2 (two) times daily. At 0800 and 1700     ferrous sulfate 325 (65 FE) MG tablet  Take 1 tablet (325 mg total) by mouth 3 (three) times daily after meals.     folic acid 1 MG tablet  Commonly known as:  FOLVITE  Take 1 tablet (1 mg total) by mouth daily.     HYDROcodone-acetaminophen 5-325 MG tablet  Commonly known as:  NORCO/VICODIN  Take 1-2 tablets by mouth every 6 (six) hours as needed for moderate pain.  multivitamin with minerals Tabs tablet  Take 1 tablet by mouth daily.     QUEtiapine 25 MG tablet  Commonly known as:  SEROQUEL  Take 12.5mg  by mouth daily at 4pm     QUEtiapine 50 MG tablet  Commonly known as:  SEROQUEL  Take 50 mg by mouth at bedtime.     thiamine 100 MG tablet  Take 1 tablet (100 mg total) by mouth daily.        Meds ordered this encounter  Medications  . chlordiazePOXIDE (LIBRIUM) 5 MG capsule    Sig: Take one tablet by mouth at 8am,12noon, and 4pm for anxiety  . AMBULATORY NON FORMULARY MEDICATION    Sig: Magic Cup One cup at lunchtime for weight loss    Immunization History  Administered Date(s) Administered  . Pneumococcal Conjugate-13 07/31/2014  . Td 09/28/1995, 08/20/2008  . Tdap 07/18/2014  . Zoster 01/16/2008     Social History  Substance Use Topics  . Smoking status: Never Smoker   . Smokeless tobacco: Never Used  . Alcohol Use: 25.2 oz/week    42 Glasses of wine per week    Review of Systems somewhat limited secondary to dementia divided by patient and staff.  In general no complaints of fever or chills.  Skin no complaint rashes or itching.  Head ears eyes nose mouth and throat does not complain of visual changes or sore throat.  Respiratory no complaints of shortness breath or cough.  Cardiac no chest pain.  GI is not complaining of any abdominal discomfort nausea or vomiting diarrhea or constipation.  GU does not complain of dysuria.  Muscle skeletal has weakness especially lower extremities but does not complain of joint pain.  Neurologic is not complaining of dizziness or headache.  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 Lung sounds are clear   CARDIOVASCULAR: Heart RRR no murmurs, rubs or gallops. No peripheral edema  GASTROINTESTINAL: Abdomen is soft, non-tender, not distended w/ normal bowel sounds.  GENITOURINARY: Bladder non tender, not distended  MUSCULOSKELETAL: B knees with flexion contractures artery ambulates in a wheelchair NEUROLOGIC: Cranial nerves 2-12 grossly intact. Moves Upper extremities PSYCHIATRIC: dementia, she is pleasant appropriate and conversant on exam today she does not talk a whole lot but is able to follow simple verbal commands and have a short conversation  Patient Active Problem List   Diagnosis Date Noted  . Anemia of chronic disease 08/29/2015  . CKD (chronic kidney disease) stage 2, GFR 60-89 ml/min 08/29/2015  . Closed wound of head 08/25/2015   . Psychosis 08/01/2015  . Mood disorder ([place]) 06/11/2015  . Acute blood loss anemia 04/23/2015  . 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 Right leg swelling 03/21/2015  . DVT, femoral, chronic ([place]) 03/20/2015  . Pressure ulcer 03/14/2015  . Malnutrition of moderate degree ([place]) 03/14/2015  . Closed right hip fracture ([place]) 03/13/2015  . UTI (lower urinary tract infection) 03/13/2015  . Hyperlipidemia 03/13/2015  . Recurrent falls 10/31/2014  . Impaired glucose tolerance 10/31/2014  . Scalp laceration 08/04/2014  . Posterior pain of right hip 12/19/2012  . Urinary incontinence 04/23/2012  . 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 Facial fracture ([place]) 04/10/2012  .  Spinal fracture 04/10/2012  . Abdominal pain, other specified site 03/16/2012  . Alcohol abuse 02/27/2012  . Insomnia 09/17/2011  . Dementia with behavioral disturbance 06/23/2011  . Left shoulder pain 02/09/2011  . Preventative health care 02/01/2011  . ANXIETY DEPRESSION 06/12/2009  . DIZZINESS 06/12/2009  . ELECTROCARDIOGRAM, ABNORMAL 08/20/2008  . 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 DIVERTICULOSIS, COLON 08/04/2007  . MELANOMA, HX OF 08/04/2007  . HYPERLIPIDEMIA 06/24/2007  . LOW BACK PAIN 06/24/2007  . Morbid obesity ([place]) 06/23/2007  . Anxiety state 06/23/2007  . Essential hypertension 06/23/2007  . ALLERGIC RHINITIS 06/23/2007  . GERD 06/23/2007  . Irritable bowel syndrome 06/23/2007  . POSTMENOPAUSAL STATUS 06/23/2007  . COLONIC POLYPS, HX OF 03/08/2002   Labs.  09/01/2015.  Iron 38-ferritin 141.1-B12 greater than 1500-reticulocyte count 2.0.  08/29/2015.  WBC 6.1 hemoglobin 8.3 platelets 228.  08/26/2015.  Albumin 2.9 otherwise liver function tests within normal limits.  Sodium 139 potassium 3.5 BUN 16 creatinine 0.8.  Depakote level XXXVII--hemoglobin A1c 4.7.   CBC    Component Value Date/Time   WBC 4.7 03/16/2015 0500   RBC 2.68*  03/16/2015 0500   HGB 8.5* 03/16/2015 0500   HCT 25.5* 03/16/2015 0500   PLT 285 03/16/2015 0500   MCV 95.1 03/16/2015 0500   LYMPHSABS 1.3 03/13/2015 1544   MONOABS 0.9 03/13/2015 1544   EOSABS 0.2 03/13/2015 1544   BASOSABS 0.0 03/13/2015 1544    CMP     Component Value Date/Time   NA 139 03/16/2015 0500   K 3.0* 03/16/2015 0500   CL 109 03/16/2015 0500   CO2 23 03/16/2015 0500   GLUCOSE 89 03/16/2015 0500   GLUCOSE 106* 07/14/2006 0803   BUN 14 03/16/2015 0500   CREATININE 0.71 03/16/2015 0500   CALCIUM 8.3* 03/16/2015 0500   PROT 5.8* 03/15/2015 0500   ALBUMIN 2.3* 03/15/2015 0500   AST 19 03/15/2015 0500   ALT 11* 03/15/2015 0500   ALKPHOS 95 03/15/2015 0500   BILITOT 0.3 03/15/2015 0500   GFRNONAA >60 03/16/2015 0500   GFRAA >60 03/16/2015 0500    Assessment and Plan   dementia with psychosis-this appears to have improved fairly significantly again she continues on Depakote as well as Seroquel  and Librium  She will need close monitoring an assisted living with expedient primary care provider follow-up.  #2 history of anemia-she continues on iron as well as Folic acid  suspect there may be an element of chronic disease with history of alcohol abuse in the past-hemoglobin actually showed improvement at 8.3 back in December-home health to update labs and notify primary care provider of results and new facility.  #3 history hypertension this appears stable on Norvasc recent blood pressures 120/68-124/70  #4. History of right leg DVT with history of non-operative right hip fracture-she continues on Eliquis for anticoagulation will defer continuation of this to her primary care provider at the new facility--DVT was in late June 2016  #5 history a right hip fracture this is being managed conservatively thought by orthopedics not to be a good surgical candidate this is been relatively stable her pain has been well-controlled it appears.  Again patient will need continued  PT and OT for strengthening at new facility as well as expedient primary care provider follow-up and home health support-labs will be drawn by home health when she gets the new facility and primary care provider will have to be notified of results.  [id number] note greater than 30 minutes spent on this  discharge summary-greater than 50% of time spent coordinating plan of care    Dolph Tavano C,

## 2015-11-10 NOTE — ED Provider Notes (Signed)
CSN: [id number]     Arrival date & time 11/10/15  2311 History  By signing my name below, I, Altamease Oiler, attest that this documentation has been prepared under the direction and in the presence of Merryl Hacker, MD . Electronically Signed: Altamease Oiler, ED Scribe. 11/10/2015. 11:48 PM    Chief Complaint  Patient presents with  . Fall   The history is provided by the patient. No language interpreter was used.   Brought in by EMS from [facility], Tara Shaffer is a 70 y.o. female with history of HTN, HLD, chronic back pain, alcohol abuse, anxiety, and depression who presents to the Emergency Department complaining of a fall from bed tonight at her facility. Pt states that she attempted to pick up a dropped item and fell to her buttocks. Prior to the fall she had no dizziness, chest pain, or SOB.  Pt denies head injury or LOC. She has no pain or injuries but was sent to the ED because her son wanted her evaluated. NKDA.   Past Medical History  Diagnosis Date  . MELANOMA, HX OF 08/04/2007  . ALLERGIC RHINITIS 06/23/2007  . ANXIETY DEPRESSION 06/12/2009  . ANXIETY 06/23/2007  . 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 GERD 06/23/2007  . HYPERLIPIDEMIA 06/24/2007  . HYPERTENSION 06/23/2007  . COLONIC POLYPS, HX OF 03/08/2002  . DIVERTICULOSIS, COLON 08/04/2007  . Irritable bowel syndrome 06/23/2007  . Memory dysfunction 06/23/2011  . Cancer ([place])     skin cx/ hx melanoma  . LOW BACK PAIN 06/24/2007  . Alcohol abuse 02/27/2012   Past Surgical History  Procedure Laterality Date  . Cesarean section      x 2  . Colonoscopy    . Appendectomy     Family History  Problem Relation Age of Onset  . Hypertension Other   . Diabetes Other   . Colon cancer Cousin    Social History  Substance Use Topics  . Smoking status: Never Smoker   . Smokeless tobacco: Never Used  . Alcohol Use: 25.2 oz/week    42 Glasses of wine per week   OB History    No data available      Review of Systems  Constitutional: Negative for fever.  Respiratory: Negative for shortness of breath.   Cardiovascular: Negative for chest pain.  Skin: Negative for wound.  Neurological: Negative for syncope.  All other systems reviewed and are negative.     Allergies  Codeine phosphate  Home Medications   Prior to Admission medications   Medication Sig Start Date End Date Taking? Authorizing Provider  amLODipine (NORVASC) 5 MG tablet Take 1 tablet (5 mg total) by mouth daily. 03/17/15   [name], MD  apixaban (ELIQUIS) 5 MG TABS tablet Take 10 mg by mouth 2 (two) times daily. At 0800 and 1700    Historical Provider, MD  cephALEXin (KEFLEX) 500 MG capsule Take 1 capsule (500 mg total) by mouth 2 (two) times daily. 11/11/15   Merryl Hacker, MD  divalproex (DEPAKOTE SPRINKLE) 125 MG capsule Take 375 mg by mouth 4 (four) times daily. 250mg  once daily, 375mg  at bedtime    Historical Provider, MD  ferrous sulfate 325 (65 FE) MG tablet Take 1 tablet (325 mg total) by mouth 3 (three) times daily after meals. 03/17/15   [name], MD  folic acid (FOLVITE) 1 MG tablet Take 1 tablet (1 mg total) by mouth daily. 03/17/15   [name], MD  HYDROcodone-acetaminophen (NORCO/VICODIN) 5-325 MG per tablet Take 1-2 tablets by mouth every 6 (six) hours as needed for moderate pain. 03/17/15   [name], MD  LORazepam (ATIVAN) 0.5 MG tablet Take 0.5 mg by mouth 4 (four) times daily -  with meals and at bedtime. 8am, noon, 4pm, 8pm    Historical Provider, MD  Multiple Vitamin (MULTIVITAMIN WITH MINERALS) TABS tablet Take 1 tablet by mouth daily. 03/17/15   [name], MD  QUEtiapine (SEROQUEL) 25 MG tablet Take 12.5 mg by mouth daily. Take  12.5    Historical Provider, MD  QUEtiapine (SEROQUEL) 50 MG tablet Take 50 mg by mouth at bedtime. Take 12.5 mg at 4 PM    Historical Provider, MD  thiamine 100 MG tablet Take 1 tablet (100 mg total) by mouth daily. 03/17/15    [name], MD   BP 139/73 mmHg  Pulse 69  Temp(Src) 98.3 F (36.8 C) (Oral)  Resp 17  SpO2 100% Physical Exam  Constitutional: No distress.  Elderly  HENT:  Head: Normocephalic and atraumatic.  Mouth/Throat: Oropharynx is clear and moist.  Eyes: Pupils are equal, round, and reactive to light.  Cardiovascular: Normal rate, regular rhythm and normal heart sounds.   No murmur heard. Pulmonary/Chest: Effort normal and breath sounds normal. No respiratory distress. She has no wheezes.  Abdominal: Soft. Bowel sounds are normal. There is no tenderness. There is no rebound.  Musculoskeletal:  Normal range of motion of the bilateral hips and knees, no obvious midline tenderness of the lower lumbar or thoracic spine, no step-off or deformities  Neurological: She is alert.  Oriented to person and place  Skin: Skin is warm and dry.  No bruising noted  Psychiatric: She has a normal mood and affect.  Nursing note and vitals reviewed.   ED Course  Procedures (including critical care time)  COORDINATION OF CARE: 11:43 PM Discussed treatment plan with pt at bedside and pt agreed to plan.  Labs Review Labs Reviewed  URINALYSIS, ROUTINE W REFLEX MICROSCOPIC (NOT AT [facility]) - Abnormal; Notable for the following:    Color, Urine RED (*)    APPearance TURBID (*)    Hgb urine dipstick LARGE (*)    Protein, ur 100 (*)    Leukocytes, UA LARGE (*)    All other components within normal limits  URINE MICROSCOPIC-ADD ON - Abnormal; Notable for the following:    Squamous Epithelial / LPF 0-5 (*)    Bacteria, UA MANY (*)    All other components within normal limits  URINE CULTURE    Imaging Review Ct Head Wo Contrast  11/11/2015  CLINICAL DATA:  Patient fell from bed to floor at 2200 hours. No loss of consciousness. History of dementia. EXAM: CT HEAD WITHOUT CONTRAST TECHNIQUE: Contiguous axial images were obtained from the base of the skull through the vertex without intravenous  contrast. COMPARISON:  08/21/2015 FINDINGS: Diffuse cerebral atrophy. Ventricular dilatation consistent with central atrophy. Low-attenuation changes throughout the deep white matter consistent with small vessel ischemia. No mass effect or midline shift. No abnormal extra-axial fluid collections. Gray-white matter junctions are distinct. Basal cisterns are not effaced. No evidence of acute intracranial hemorrhage. No depressed skull fractures. Visualized paranasal sinuses and mastoid air cells are not opacified. Vascular calcifications. IMPRESSION: No acute intracranial abnormalities. Chronic atrophy and small vessel ischemic changes. Electronically Signed   By: Lucienne Capers M.D.   On: 11/11/2015 00:50      EKG Interpretation   Date/Time:  Monday November 10 2015 23:51:14 EST Ventricular Rate:  66 PR Interval:  193 QRS Duration: 98 QT Interval:  446 QTC Calculation: 467 R Axis:   -44 Text Interpretation:  Sinus rhythm Left axis deviation Confirmed by Adison Reifsteck   MD, Valena Ivanov ([id number]) on 11/11/2015 1:10:06 AM      MDM   Final diagnoses:  Fall, initial encounter  UTI (lower urinary tract infection)    Patient presents following a fall. She is able to provide history but does have mild dementia and is only oriented 2. Denies any pain at this time. No obvious long bone deformities. Screening EKG is reassuring. CT head obtained given that she is on Eliquis and is negative.  Cath urine with too numerous to count red cells, 6-30 white cells and many bacteria. Will culture and place on Keflex for a possible UTI. Will discharge back to living facility.  After history, exam, and medical workup I feel the patient has been appropriately medically screened and is safe for discharge home. Pertinent diagnoses were discussed with the patient. Patient was given return precautions.  I personally performed the services described in this documentation, which was scribed in my presence. The recorded  information has been reviewed and is accurate.    Merryl Hacker, MD 11/11/15 [phone]

## 2015-11-10 NOTE — ED Notes (Signed)
Pt arrived via EMS from [facility] c/o fall at facility from bed to floor around 2200.  Denies LOC, hx dementia, denies any injuries.  Pt takes eliquis.

## 2015-11-11 ENCOUNTER — Encounter: Payer: Self-pay | Admitting: *Deleted

## 2015-11-11 ENCOUNTER — Emergency Department (HOSPITAL_COMMUNITY): Payer: Medicare Other

## 2015-11-11 DIAGNOSIS — S72001S Fracture of unspecified part of neck of right femur, sequela: Secondary | ICD-10-CM

## 2015-11-11 DIAGNOSIS — M24562 Contracture, left knee: Secondary | ICD-10-CM

## 2015-11-11 DIAGNOSIS — Z043 Encounter for examination and observation following other accident: Secondary | ICD-10-CM | POA: Diagnosis not present

## 2015-11-11 DIAGNOSIS — M6281 Muscle weakness (generalized): Secondary | ICD-10-CM

## 2015-11-11 DIAGNOSIS — M24561 Contracture, right knee: Secondary | ICD-10-CM

## 2015-11-11 LAB — URINALYSIS, ROUTINE W REFLEX MICROSCOPIC
Bilirubin Urine: NEGATIVE
Glucose, UA: NEGATIVE mg/dL
Ketones, ur: NEGATIVE mg/dL
Nitrite: NEGATIVE
PROTEIN: 100 mg/dL — AB
Specific Gravity, Urine: 1.007 (ref 1.005–1.030)
pH: 7.5 (ref 5.0–8.0)

## 2015-11-11 LAB — URINE MICROSCOPIC-ADD ON

## 2015-11-11 MED ORDER — CEPHALEXIN 500 MG PO CAPS: 500.0000 mg | ORAL_CAPSULE | Freq: Two times a day (BID) | ORAL | Status: DC

## 2015-11-11 NOTE — ED Notes (Signed)
Pt stable, states understanding of discharge instructions 

## 2015-11-11 NOTE — Discharge Instructions (Signed)

## 2015-11-12 ENCOUNTER — Ambulatory Visit: Payer: 59 | Admitting: Internal Medicine

## 2015-11-12 LAB — URINE CULTURE

## 2015-11-17 ENCOUNTER — Telehealth: Payer: Self-pay | Admitting: Internal Medicine

## 2015-11-17 NOTE — Telephone Encounter (Signed)
Would like to see patient for 10 visits for OT over next 8 weeks.  1 week 1, 2 week 4, 1 week 1.

## 2015-11-18 ENCOUNTER — Telehealth: Payer: Self-pay | Admitting: Internal Medicine

## 2015-11-18 ENCOUNTER — Encounter (HOSPITAL_COMMUNITY): Payer: Self-pay | Admitting: Emergency Medicine

## 2015-11-18 ENCOUNTER — Emergency Department (HOSPITAL_COMMUNITY)
Admission: EM | Admit: 2015-11-18 | Discharge: 2015-11-18 | Disposition: A | Discharge status: Home / Self Care | Payer: Medicare Other | Attending: Emergency Medicine | Admitting: Emergency Medicine

## 2015-11-18 DIAGNOSIS — Z85820 Personal history of malignant melanoma of skin: Secondary | ICD-10-CM | POA: Diagnosis not present

## 2015-11-18 DIAGNOSIS — Z86010 Personal history of colonic polyps: Secondary | ICD-10-CM | POA: Insufficient documentation

## 2015-11-18 DIAGNOSIS — Z043 Encounter for examination and observation following other accident: Secondary | ICD-10-CM | POA: Diagnosis present

## 2015-11-18 DIAGNOSIS — R600 Localized edema: Secondary | ICD-10-CM | POA: Insufficient documentation

## 2015-11-18 DIAGNOSIS — W06XXXA Fall from bed, initial encounter: Secondary | ICD-10-CM | POA: Insufficient documentation

## 2015-11-18 DIAGNOSIS — Y9389 Activity, other specified: Secondary | ICD-10-CM | POA: Diagnosis not present

## 2015-11-18 DIAGNOSIS — Z8739 Personal history of other diseases of the musculoskeletal system and connective tissue: Secondary | ICD-10-CM | POA: Insufficient documentation

## 2015-11-18 DIAGNOSIS — Z79899 Other long term (current) drug therapy: Secondary | ICD-10-CM | POA: Diagnosis not present

## 2015-11-18 DIAGNOSIS — Y92129 Unspecified place in nursing home as the place of occurrence of the external cause: Secondary | ICD-10-CM | POA: Diagnosis not present

## 2015-11-18 DIAGNOSIS — Z7901 Long term (current) use of anticoagulants: Secondary | ICD-10-CM | POA: Insufficient documentation

## 2015-11-18 DIAGNOSIS — Z8719 Personal history of other diseases of the digestive system: Secondary | ICD-10-CM | POA: Diagnosis not present

## 2015-11-18 DIAGNOSIS — Z8639 Personal history of other endocrine, nutritional and metabolic disease: Secondary | ICD-10-CM | POA: Diagnosis not present

## 2015-11-18 DIAGNOSIS — F419 Anxiety disorder, unspecified: Secondary | ICD-10-CM | POA: Insufficient documentation

## 2015-11-18 DIAGNOSIS — I10 Essential (primary) hypertension: Secondary | ICD-10-CM | POA: Diagnosis not present

## 2015-11-18 DIAGNOSIS — Y999 Unspecified external cause status: Secondary | ICD-10-CM | POA: Insufficient documentation

## 2015-11-18 MED ORDER — DIVALPROEX SODIUM 125 MG PO CSDR
250.0000 mg | DELAYED_RELEASE_CAPSULE | Freq: Once | ORAL | Status: AC
  Administered 2015-11-18: 250 mg via ORAL
  Filled 2015-11-18: qty 2

## 2015-11-18 MED ORDER — AMLODIPINE BESYLATE 5 MG PO TABS
5.0000 mg | ORAL_TABLET | Freq: Once | ORAL | Status: AC
  Administered 2015-11-18: 5 mg via ORAL
  Filled 2015-11-18: qty 1

## 2015-11-18 MED ORDER — APIXABAN 5 MG PO TABS
5.0000 mg | ORAL_TABLET | Freq: Once | ORAL | Status: AC
  Administered 2015-11-18: 5 mg via ORAL
  Filled 2015-11-18: qty 1

## 2015-11-18 NOTE — ED Notes (Signed)
[facility] is transporting the patient back to [facility].

## 2015-11-18 NOTE — ED Notes (Signed)
[facility] called for pickup and transportation to [facility], per [place], Therapist, sports.

## 2015-11-18 NOTE — ED Notes (Signed)
[facility] transporting the patient back to [facility].

## 2015-11-18 NOTE — Telephone Encounter (Signed)
Sharyn Lull has called back regarding the verbal orders. Can you please give her a call

## 2015-11-18 NOTE — Discharge Instructions (Signed)
Fall Prevention in Hospitals, Adult As a hospital patient, your condition and the treatments you receive can increase your risk for falls. Some additional risk factors for falls in a hospital include:  Being in an unfamiliar environment.  Being on bed rest.  Your surgery.  Taking certain medicines.  Your tubing requirements, such as intravenous (IV) therapy or catheters. It is important that you learn how to decrease fall risks while at the hospital. Below are important tips that can help prevent falls. SAFETY TIPS FOR PREVENTING FALLS Talk about your risk of falling.  Ask your health care provider why you are at risk for falling. Is it your medicine, illness, tubing placement, or something else?  Make a plan with your health care provider to keep you safe from falls.  Ask your health care provider or pharmacist about side effects of your medicines. Some medicines can make you dizzy or affect your coordination. Ask for help.  Ask for help before getting out of bed. You may need to press your call button.  Ask for assistance in getting safely to the toilet.  Ask for a walker or cane to be put at your bedside. Ask that most of the side rails on your bed be placed up before your health care provider leaves the room.  Ask family or friends to sit with you.  Ask for things that are out of your reach, such as your glasses, hearing aids, telephone, bedside table, or call button. Follow these tips to avoid falling:  Stay lying or seated, rather than standing, while waiting for help.  Wear rubber-soled slippers or shoes whenever you walk in the hospital.  Avoid quick, sudden movements.  Change positions slowly.  Sit on the side of your bed before standing.  Stand up slowly and wait before you start to walk.  Let your health care provider know if there is a spill on the floor.  Pay careful attention to the medical equipment, electrical cords, and tubes around you.  When you  need help, use your call button by your bed or in the bathroom. Wait for one of your health care providers to help you.  If you feel dizzy or unsure of your footing, return to bed and wait for assistance.  Avoid being distracted by the TV, telephone, or another person in your room.  Do not lean or support yourself on rolling objects, such as IV poles or bedside tables.   This information is not intended to replace advice given to you by your health care provider. Make sure you discuss any questions you have with your health care provider.   Document Released: 09/10/2000 Document Revised: 10/04/2014 Document Reviewed: 05/21/2012 Elsevier Interactive Patient Education [employer].  Patient was given her morning dose of amlodipine, Depakote, and Eliquis. DO NOT REPEAT THESE DOSES OF MEDICATION.

## 2015-11-18 NOTE — ED Notes (Signed)
Pt is from [facility] assisted [facility] and she was getting out of bed lost her footing and fell. No c/o of injuries, [facility] could not reach POA so pt was brought here has had no meds and no breakfast this am , uses a w/c for ambulation states did not hit head but is on eliquis

## 2015-11-18 NOTE — Telephone Encounter (Signed)
Eddie Dibbles, with [facility]  [phone]  His calling to get verbal orders to continue with PT for the next 8 weeks starting today for transfer training and exercise

## 2015-11-18 NOTE — ED Provider Notes (Signed)
CSN: [id number]     Arrival date & time 11/18/15  0908 History   First MD Initiated Contact with Patient 11/18/15 [phone]     No chief complaint on file.    (Consider location/radiation/quality/duration/timing/severity/associated sxs/prior Treatment) Patient is a 70 y.o. female presenting with fall. The history is provided by the patient and the EMS personnel.  Fall This is a new problem. The current episode started 1 to 2 hours ago. The problem occurs constantly. The problem has been resolved. Pertinent negatives include no chest pain, no abdominal pain, no headaches and no shortness of breath. Nothing aggravates the symptoms. Nothing relieves the symptoms. She has tried nothing for the symptoms.    Past Medical History  Diagnosis Date  . MELANOMA, HX OF 08/04/2007  . ALLERGIC RHINITIS 06/23/2007  . ANXIETY DEPRESSION 06/12/2009  . ANXIETY 06/23/2007  . 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 GERD 06/23/2007  . HYPERLIPIDEMIA 06/24/2007  . HYPERTENSION 06/23/2007  . COLONIC POLYPS, HX OF 03/08/2002  . DIVERTICULOSIS, COLON 08/04/2007  . Irritable bowel syndrome 06/23/2007  . Memory dysfunction 06/23/2011  . Cancer ([place])     skin cx/ hx melanoma  . LOW BACK PAIN 06/24/2007  . Alcohol abuse 02/27/2012   Past Surgical History  Procedure Laterality Date  . Cesarean section      x 2  . Colonoscopy    . Appendectomy     Family History  Problem Relation Age of Onset  . Hypertension Other   . Diabetes Other   . Colon cancer Cousin    Social History  Substance Use Topics  . Smoking status: Never Smoker   . Smokeless tobacco: Never Used  . Alcohol Use: 25.2 oz/week    42 Glasses of wine per week   OB History    No data available     Review of Systems  Respiratory: Negative for shortness of breath.   Cardiovascular: Negative for chest pain.  Gastrointestinal: Negative for abdominal pain.  Neurological: Negative for headaches.  All other systems reviewed and are  negative.     Allergies  Codeine phosphate  Home Medications   Prior to Admission medications   Medication Sig Start Date End Date Taking? Authorizing Provider  AMBULATORY NON FORMULARY MEDICATION Magic Cup One cup at lunchtime for weight loss    Historical Provider, MD  amLODipine (NORVASC) 5 MG tablet Take 1 tablet (5 mg total) by mouth daily. 03/17/15   [name], MD  apixaban (ELIQUIS) 5 MG TABS tablet Take 10 mg by mouth 2 (two) times daily. At 0800 and 1700    Historical Provider, MD  chlordiazePOXIDE (LIBRIUM) 5 MG capsule Take one tablet by mouth at 8am,12noon, and 4pm for anxiety    Historical Provider, MD  divalproex (DEPAKOTE SPRINKLE) 125 MG capsule Take two tablets by mouth twice daily    Historical Provider, MD  ferrous sulfate 325 (65 FE) MG tablet Take 1 tablet (325 mg total) by mouth 3 (three) times daily after meals. 03/17/15   [name], MD  folic acid (FOLVITE) 1 MG tablet Take 1 tablet (1 mg total) by mouth daily. 03/17/15   [name], MD  HYDROcodone-acetaminophen (NORCO/VICODIN) 5-325 MG per tablet Take 1-2 tablets by mouth every 6 (six) hours as needed for moderate pain. Patient taking differently: Take one tablet by mouth every 6 hours as needed for pain 03/17/15   Jonetta Osgood, MD  Multiple Vitamin (MULTIVITAMIN WITH MINERALS) TABS tablet Take 1 tablet by  mouth daily. 03/17/15   [name], MD  QUEtiapine (SEROQUEL) 25 MG tablet Take 12.5mg  by mouth daily at 4pm    Historical Provider, MD  QUEtiapine (SEROQUEL) 50 MG tablet Take 50 mg by mouth at bedtime.     Historical Provider, MD  thiamine 100 MG tablet Take 1 tablet (100 mg total) by mouth daily. 03/17/15   [name], MD   BP 145/85 mmHg  Pulse 72  Temp(Src) 98.3 F (36.8 C) (Oral)  Resp 16  SpO2 98% Physical Exam  Constitutional: She is oriented to person, place, and time. She appears well-developed and well-nourished. No distress.  HENT:  Head: Normocephalic and  atraumatic.  Eyes: Conjunctivae are normal.  Neck: Neck supple. No tracheal deviation present.  Cardiovascular: Normal rate and regular rhythm.   Pulmonary/Chest: Effort normal. No respiratory distress.  Abdominal: Soft. She exhibits no distension.  Musculoskeletal:       Right hip: She exhibits normal range of motion, normal strength and no tenderness.       Left hip: She exhibits normal range of motion, normal strength and no tenderness.  2+ b/l LE pitting edema, Pt states is chronic  Neurological: She is alert and oriented to person, place, and time. She has normal strength. No cranial nerve deficit. Coordination and gait normal. GCS eye subscore is 4. GCS verbal subscore is 5. GCS motor subscore is 6.  Skin: Skin is warm and dry.  Psychiatric: She has a normal mood and affect.    ED Course  Procedures (including critical care time) Labs Review Labs Reviewed - No data to display  Imaging Review No results found. I have personally reviewed and evaluated these images and lab results as part of my medical decision-making.   EKG Interpretation None      MDM   Final diagnoses:  Examination following fall from height with no apparent injury  Fall from bed, initial encounter    70 y.o. female presents with fall where she states she "slid into the floor when I tried to get out of bed". Sent from facility as they were unable to reach POA. No signs of injury. Anticoagulated but no head injury or other complicating features. No further workup emergently indicated. Plan to follow up with PCP as needed and return precautions discussed for worsening or new concerning symptoms.     Leo Grosser, MD 11/18/15 2226

## 2015-11-23 ENCOUNTER — Emergency Department (HOSPITAL_COMMUNITY): Payer: Medicare Other

## 2015-11-23 ENCOUNTER — Emergency Department (HOSPITAL_COMMUNITY)
Admission: EM | Admit: 2015-11-23 | Discharge: 2015-11-23 | Disposition: A | Discharge status: Home / Self Care | Payer: Medicare Other | Attending: Emergency Medicine | Admitting: Emergency Medicine

## 2015-11-23 ENCOUNTER — Encounter (HOSPITAL_COMMUNITY): Payer: Self-pay | Admitting: Emergency Medicine

## 2015-11-23 DIAGNOSIS — Z7901 Long term (current) use of anticoagulants: Secondary | ICD-10-CM | POA: Diagnosis not present

## 2015-11-23 DIAGNOSIS — F419 Anxiety disorder, unspecified: Secondary | ICD-10-CM | POA: Insufficient documentation

## 2015-11-23 DIAGNOSIS — Y998 Other external cause status: Secondary | ICD-10-CM | POA: Insufficient documentation

## 2015-11-23 DIAGNOSIS — E669 Obesity, unspecified: Secondary | ICD-10-CM | POA: Insufficient documentation

## 2015-11-23 DIAGNOSIS — Z79899 Other long term (current) drug therapy: Secondary | ICD-10-CM | POA: Diagnosis not present

## 2015-11-23 DIAGNOSIS — W19XXXA Unspecified fall, initial encounter: Secondary | ICD-10-CM

## 2015-11-23 DIAGNOSIS — W06XXXA Fall from bed, initial encounter: Secondary | ICD-10-CM | POA: Diagnosis not present

## 2015-11-23 DIAGNOSIS — Y9389 Activity, other specified: Secondary | ICD-10-CM | POA: Insufficient documentation

## 2015-11-23 DIAGNOSIS — I10 Essential (primary) hypertension: Secondary | ICD-10-CM | POA: Insufficient documentation

## 2015-11-23 DIAGNOSIS — S0101XA Laceration without foreign body of scalp, initial encounter: Secondary | ICD-10-CM

## 2015-11-23 DIAGNOSIS — Z86010 Personal history of colonic polyps: Secondary | ICD-10-CM | POA: Insufficient documentation

## 2015-11-23 DIAGNOSIS — S72001D Fracture of unspecified part of neck of right femur, subsequent encounter for closed fracture with routine healing: Secondary | ICD-10-CM | POA: Diagnosis not present

## 2015-11-23 DIAGNOSIS — R600 Localized edema: Secondary | ICD-10-CM | POA: Diagnosis not present

## 2015-11-23 DIAGNOSIS — Y9289 Other specified places as the place of occurrence of the external cause: Secondary | ICD-10-CM | POA: Insufficient documentation

## 2015-11-23 DIAGNOSIS — Z8639 Personal history of other endocrine, nutritional and metabolic disease: Secondary | ICD-10-CM | POA: Insufficient documentation

## 2015-11-23 DIAGNOSIS — F0390 Unspecified dementia without behavioral disturbance: Secondary | ICD-10-CM | POA: Insufficient documentation

## 2015-11-23 DIAGNOSIS — Z8719 Personal history of other diseases of the digestive system: Secondary | ICD-10-CM | POA: Insufficient documentation

## 2015-11-23 DIAGNOSIS — Z8739 Personal history of other diseases of the musculoskeletal system and connective tissue: Secondary | ICD-10-CM | POA: Insufficient documentation

## 2015-11-23 DIAGNOSIS — Z792 Long term (current) use of antibiotics: Secondary | ICD-10-CM | POA: Diagnosis not present

## 2015-11-23 DIAGNOSIS — F329 Major depressive disorder, single episode, unspecified: Secondary | ICD-10-CM | POA: Insufficient documentation

## 2015-11-23 DIAGNOSIS — W01198A Fall on same level from slipping, tripping and stumbling with subsequent striking against other object, initial encounter: Secondary | ICD-10-CM | POA: Diagnosis not present

## 2015-11-23 DIAGNOSIS — Z85820 Personal history of malignant melanoma of skin: Secondary | ICD-10-CM | POA: Diagnosis not present

## 2015-11-23 DIAGNOSIS — Y92128 Other place in nursing home as the place of occurrence of the external cause: Secondary | ICD-10-CM | POA: Insufficient documentation

## 2015-11-23 DIAGNOSIS — X58XXXD Exposure to other specified factors, subsequent encounter: Secondary | ICD-10-CM | POA: Insufficient documentation

## 2015-11-23 DIAGNOSIS — S0990XA Unspecified injury of head, initial encounter: Secondary | ICD-10-CM | POA: Diagnosis present

## 2015-11-23 DIAGNOSIS — M79651 Pain in right thigh: Secondary | ICD-10-CM

## 2015-11-23 DIAGNOSIS — S79921A Unspecified injury of right thigh, initial encounter: Secondary | ICD-10-CM | POA: Insufficient documentation

## 2015-11-23 LAB — BASIC METABOLIC PANEL
ANION GAP: 9 (ref 5–15)
BUN: 14 mg/dL (ref 6–20)
CHLORIDE: 104 mmol/L (ref 101–111)
CO2: 24 mmol/L (ref 22–32)
CREATININE: 1.08 mg/dL — AB (ref 0.44–1.00)
Calcium: 8.9 mg/dL (ref 8.9–10.3)
GFR, EST AFRICAN AMERICAN: 60 mL/min — AB (ref >60)
GFR, EST NON AFRICAN AMERICAN: 52 mL/min — AB (ref >60)
Glucose, Bld: 75 mg/dL (ref 65–99)
Potassium: 3.7 mmol/L (ref 3.5–5.1)
Sodium: 137 mmol/L (ref 135–145)

## 2015-11-23 LAB — CBC WITH DIFFERENTIAL/PLATELET
BASOS ABS: 0 10*3/uL (ref 0.0–0.1)
Basophils Relative: 1 %
Eosinophils Absolute: 0.2 10*3/uL (ref 0.0–0.7)
Eosinophils Relative: 4 %
HCT: 28.8 % — ABNORMAL LOW (ref 36.0–46.0)
HEMOGLOBIN: 9.6 g/dL — AB (ref 12.0–15.0)
LYMPHS ABS: 1.4 10*3/uL (ref 0.7–4.0)
LYMPHS PCT: 27 %
MCH: 28.7 pg (ref 26.0–34.0)
MCHC: 33.3 g/dL (ref 30.0–36.0)
MCV: 86 fL (ref 78.0–100.0)
Monocytes Absolute: 0.5 10*3/uL (ref 0.1–1.0)
Monocytes Relative: 11 %
NEUTROS PCT: 59 %
Neutro Abs: 3 10*3/uL (ref 1.7–7.7)
PLATELETS: 222 10*3/uL (ref 150–400)
RBC: 3.35 MIL/uL — AB (ref 3.87–5.11)
RDW: 13.8 % (ref 11.5–15.5)
WBC: 5.2 10*3/uL (ref 4.0–10.5)

## 2015-11-23 MED ORDER — FENTANYL CITRATE (PF) 100 MCG/2ML IJ SOLN: 25.0000 ug | Freq: Once | INTRAMUSCULAR | Status: DC

## 2015-11-23 MED ORDER — SODIUM CHLORIDE 0.9 % IV SOLN: Freq: Once | INTRAVENOUS | Status: DC

## 2015-11-23 MED ORDER — CEPHALEXIN 500 MG PO CAPS: 500.0000 mg | ORAL_CAPSULE | Freq: Two times a day (BID) | ORAL | Status: DC

## 2015-11-23 MED ORDER — ACETAMINOPHEN 500 MG PO TABS
1000.0000 mg | ORAL_TABLET | Freq: Once | ORAL | Status: AC
  Administered 2015-11-23: 1000 mg via ORAL
  Filled 2015-11-23: qty 2

## 2015-11-23 NOTE — ED Notes (Signed)
Dr. Reather Converse DO at bedside.

## 2015-11-23 NOTE — ED Notes (Signed)
PA at bedside and applied staple x1.

## 2015-11-23 NOTE — ED Provider Notes (Signed)
CSN: [id number]     Arrival date & time 11/23/15  0707 History   First MD Initiated Contact with Patient 11/23/15 [phone]     Chief Complaint  Patient presents with  . 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 Patient fell on her right side is complaining of right wrist and right hip/femur tenderness. Patient does have mild leg swelling bilateral unknown duration. Patient unsure chronicity of mild erythema in the left lower extremity.  No family at bedside. Pain with range of motion  Patient is a 70 y.o. female presenting with fall. The history is provided by the patient.  Fall    Past Medical History  Diagnosis Date  . MELANOMA, HX OF 08/04/2007  . ALLERGIC RHINITIS 06/23/2007  . ANXIETY DEPRESSION 06/12/2009  . ANXIETY 06/23/2007  . 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 GERD 06/23/2007  . HYPERLIPIDEMIA 06/24/2007  . HYPERTENSION 06/23/2007  . COLONIC POLYPS, HX OF 03/08/2002  . DIVERTICULOSIS, COLON 08/04/2007  . Irritable bowel syndrome 06/23/2007  . Memory dysfunction 06/23/2011  . Cancer ([place])     skin cx/ hx melanoma  . LOW BACK PAIN 06/24/2007  . Alcohol abuse 02/27/2012   Past Surgical History  Procedure Laterality Date  . Cesarean section      x 2  . Colonoscopy    . Appendectomy     Family History  Problem Relation Age of Onset  . Hypertension Other   . Diabetes Other   . Colon cancer Cousin    Social History  Substance Use Topics  . Smoking status: Never Smoker   . Smokeless tobacco: Never Used  . Alcohol Use: 25.2 oz/week    42 Glasses of wine per week   OB History    No data available     Review of Systems  Unable to perform ROS: Dementia      Allergies  Codeine phosphate  Home  Medications   Prior to Admission medications   Medication Sig Start Date End Date Taking? Authorizing Provider  AMBULATORY NON FORMULARY MEDICATION Magic Cup One cup at lunchtime for weight loss    Historical Provider, MD  amLODipine (NORVASC) 5 MG tablet Take 1 tablet (5 mg total) by mouth daily. 03/17/15   [name], MD  apixaban (ELIQUIS) 5 MG TABS tablet Take 5 mg by mouth 2 (two) times daily.     Historical Provider, MD  cephALEXin (KEFLEX) 500 MG capsule Take 500 mg by mouth 2 (two) times daily.    Historical Provider, MD  chlordiazePOXIDE (LIBRIUM) 5 MG capsule Take one tablet by mouth at 8am,12noon, and 4pm for anxiety    Historical Provider, MD  chlordiazePOXIDE (LIBRIUM) 5 MG capsule Take 10 mg by mouth at bedtime.    Historical Provider, MD  divalproex (DEPAKOTE SPRINKLE) 125 MG capsule 250 mg 2 (two) times daily. Take two tablets by mouth twice daily    Historical Provider, MD  ferrous sulfate 325 (65 FE) MG tablet Take 1 tablet (325 mg total) by mouth 3 (three) times daily after meals. 03/17/15   [name], MD  folic acid (FOLVITE) 1 MG tablet Take 1 tablet (1 mg total) by mouth daily. 03/17/15   [name], MD  HYDROcodone-acetaminophen (NORCO/VICODIN) 5-325 MG per tablet Take 1-2 tablets by mouth every 6 (six) hours as needed for moderate  pain. Patient taking differently: Take one tablet by mouth every 6 hours as needed for pain 03/17/15   Jonetta Osgood, MD  LORazepam (ATIVAN) 0.5 MG tablet Take 0.5 mg by mouth every 8 (eight) hours.    Historical Provider, MD  Multiple Vitamin (MULTIVITAMIN WITH MINERALS) TABS tablet Take 1 tablet by mouth daily. 03/17/15   [name], MD  QUEtiapine (SEROQUEL) 25 MG tablet Take 12.5mg  by mouth daily at 4pm    Historical Provider, MD  QUEtiapine (SEROQUEL) 50 MG tablet Take 50 mg by mouth at bedtime.     Historical Provider, MD  thiamine 100 MG tablet Take 1 tablet (100 mg total) by mouth daily. 03/17/15   [name], MD  traMADol (ULTRAM) 50 MG tablet Take 50 mg by mouth every 8 (eight) hours.    Historical Provider, MD   BP 133/69 mmHg  Pulse 69  Temp(Src) 97.8 F (36.6 C) (Oral)  Resp 16  Ht 5\' 5"  (1.651 m)  Wt 117 lb (53.071 kg)  BMI 19.47 kg/m2  SpO2 95% Physical Exam  Constitutional: She is oriented to person, place, and time. She appears well-developed and well-nourished.  HENT:  Head: Normocephalic and atraumatic.  Eyes: Conjunctivae are normal. Right eye exhibits no discharge. Left eye exhibits no discharge.  Neck: Normal range of motion. Neck supple. No tracheal deviation present.  Cardiovascular: Normal rate and regular rhythm.   Pulmonary/Chest: Effort normal and breath sounds normal.  Abdominal: Soft. She exhibits no distension. There is no tenderness. There is no guarding.  Musculoskeletal: She exhibits edema and tenderness.  Patient is tenderness right dorsal wrist with extension and flexion no obvious deformity no laceration. Patient has mild tenderness mid femur and right hip with hip flexion. No leg shortening or deformity.  No midline cervical lumbar thoracic tenderness.  Neurological: She is alert and oriented to person, place, and time. GCS eye subscore is 4. GCS verbal subscore is 4. GCS motor subscore is 6.  Pleasant dementia on exam. Patient moves all extremities equal 5+ strength follows commands pupils equal bilateral, extra ocular muscle function intact.  Skin: Skin is warm. There is erythema.  Patient has mild pitting edema bilateral lower extremities worse on the left with mild erythema anterior and lateral bilateral extremities worse than the left. Patient has mild skin abrasion presently 1 cm anterior left lower extremity with warmth.  Psychiatric: She has a normal mood and affect.  Nursing note and vitals reviewed.   ED Course  Procedures (including critical care time) Labs Review Labs Reviewed  BASIC METABOLIC PANEL - Abnormal; Notable for the following:     Creatinine, Ser 1.08 (*)    GFR calc non Af Amer 52 (*)    GFR calc Af Amer 60 (*)    All other components within normal limits  CBC WITH DIFFERENTIAL/PLATELET - Abnormal; Notable for the following:    RBC 3.35 (*)    Hemoglobin 9.6 (*)    HCT 28.8 (*)    All other components within normal limits  ETHANOL    Imaging Review Dg Chest 1 View  11/23/2015  CLINICAL DATA:  Fall out of bed this morning.  Right hip pain. EXAM: CHEST 1 VIEW COMPARISON:  03/13/2015 FINDINGS: Right base atelectasis or scar. Left lung is clear. Heart is borderline in size. No effusions or pneumothorax. No acute bony abnormality. IMPRESSION: Minimal right base scarring or atelectasis. Electronically Signed   By: Rolm Baptise M.D.   On: 11/23/2015 08:11  Dg Pelvis 1-2 Views  11/23/2015  CLINICAL DATA:  Fall out of bed this morning on the right side. Right hip pain. EXAM: PELVIS - 1-2 VIEW COMPARISON:  03/15/2015 FINDINGS: Old right femoral neck fracture with superior migration of the femur relative to the femoral head. This is chronic, but further displaced since prior study. No subluxation or dislocation of the femoral head. No visible acute fracture. IMPRESSION: Chronic displaced fracture of the right femoral neck with increasing superior displacement of the right femoral shaft relative to the femoral head. Electronically Signed   By: Rolm Baptise M.D.   On: 11/23/2015 08:11   Dg Wrist Complete Right  11/23/2015  CLINICAL DATA:  Fall out of bed this morning.  Right wrist  bruising EXAM: RIGHT WRIST - COMPLETE 3+ VIEW COMPARISON:  None. FINDINGS: Mild joint space narrowing in the radiocarpal joint. Early degenerative changes at the first carpometacarpal joint. No acute bony abnormality. Specifically, no fracture, subluxation, or dislocation. Soft tissues are intact. IMPRESSION: No acute bony abnormality. Electronically Signed   By: Rolm Baptise M.D.   On: 11/23/2015 08:09   Dg Femur, Min 2 Views Right  11/23/2015   CLINICAL DATA:  70 year old female with acute right hip pain following fall today. Known right femoral neck fracture. EXAM: RIGHT FEMUR 2 VIEWS COMPARISON:  04/08/2015 and prior radiographs. FINDINGS: A right femoral neck fracture is again identified with increased superior displacement when compared to 04/08/2015. Remote inferior and superior right pubic rami fractures are again identified. No other changes are identified. The right femoral head remains located. IMPRESSION: Right femoral neck fracture again identified with further superior displacement when compared to 04/08/2015. No acute fractures identified. Remote right pubic rami fractures. Electronically Signed   By: Margarette Canada M.D.   On: 11/23/2015 08:12   I have personally reviewed and evaluated these images and lab results as part of my medical decision-making.   EKG Interpretation None     EKG reviewed heart rate 67, left axis deviation, borderline QT, nonspecific T-wave findings. Sinus. MDM   Final diagnoses:  Fall, initial encounter  Acute thigh pain, right  Bilateral leg edema  Closed right hip fracture, with routine healing, subsequent encounter  Left leg cellulitis  Patient presents with right-sided injuries after mechanical fall. With multiple medical problems, dementia and poor details of the event plan for screening blood work, x-rays of tender areas will discuss with nursing home afterwards. Concern for possible mild cellulitis left lower extremity will discuss chronicity with nursing home.  Discussed with radiologist, right hip fracture. Chest x-ray obtained. Tried hospitalist and orthopedics. For admission plans.  Slight change to previous hip x-ray however discussed with orthopedics it is routine healing. Patient is nonoperative based on findings and past discussion with orthopedic surgeon. Discussed this with the nursing home nurse.  Patient is nonweightbearing at baseline in wheelchair with two-person assist. Patient  stable for outpatient follow-up.  Results and differential diagnosis were discussed with the patient/parent/guardian. Xrays were independently reviewed by myself.  Close follow up outpatient was discussed, comfortable with the plan.   Medications  0.9 %  sodium chloride infusion (not administered)  acetaminophen (TYLENOL) tablet 1,000 mg (not administered)    Filed Vitals:   11/23/15 0711 11/23/15 0714  BP:  133/69  Pulse:  69  Temp:  97.8 F (36.6 C)  TempSrc:  Oral  Resp:  16  Height: 5\' 5"  (1.651 m)   Weight: 117 lb (53.071 kg)   SpO2:  95%  Final diagnoses:  Fall, initial encounter  Acute thigh pain, right  Bilateral leg edema  Closed right hip fracture, with routine healing, subsequent encounter       Elnora Morrison, MD 11/23/15 [phone]

## 2015-11-23 NOTE — ED Provider Notes (Signed)
CSN: [id number]     Arrival date & time 11/23/15  1823 History   First MD Initiated Contact with Patient 11/23/15 1834     Chief Complaint  Patient presents with  . Head Laceration     (Consider location/radiation/quality/duration/timing/severity/associated sxs/prior Treatment) HPI Tara Shaffer is a 70 y.o. female presents Presents to emergency department from memory care unit at assisted living facility with complaint of an injury to the back of the head. Patient apparently fell this morning, was seen in emergency department, diagnosed with a hip fracture. Because patient is nonambulatory and requires 2 person assist and wheelchair she was found to be nonoperative and was discharged back to the facility. Later this afternoon, it was discovered the patient had blood in the back of her hair. Patient was never seen falling this afternoon. They also stated that if she did fall, she wouldn't be able to pick herself up of the ground. Patient was in her wheelchair entire time. Nurse that I spoke with is unsure if patient hit her head on the back of the wall or a cabinet, or if this is an injury from this morning. Patient is confused at baseline. No other complaints. Patient is an eliquis   Past Medical History  Diagnosis Date  . MELANOMA, HX OF 08/04/2007  . ALLERGIC RHINITIS 06/23/2007  . ANXIETY DEPRESSION 06/12/2009  . ANXIETY 06/23/2007  . 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 GERD 06/23/2007  . HYPERLIPIDEMIA 06/24/2007  . HYPERTENSION 06/23/2007  . COLONIC POLYPS, HX OF 03/08/2002  . DIVERTICULOSIS, COLON 08/04/2007  . Irritable bowel syndrome 06/23/2007  . Memory dysfunction 06/23/2011  . Cancer ([place])     skin cx/ hx melanoma  . LOW BACK PAIN 06/24/2007  . Alcohol abuse 02/27/2012   Past Surgical History  Procedure Laterality Date  . Cesarean section      x 2  . Colonoscopy    . Appendectomy     Family History  Problem Relation Age of Onset  . Hypertension Other    . Diabetes Other   . Colon cancer Cousin    Social History  Substance Use Topics  . Smoking status: Never Smoker   . Smokeless tobacco: Never Used  . Alcohol Use: 25.2 oz/week    42 Glasses of wine per week   OB History    No data available     Review of Systems  Unable to perform ROS: Dementia      Allergies  Codeine phosphate  Home Medications   Prior to Admission medications   Medication Sig Start Date End Date Taking? Authorizing Provider  AMBULATORY NON FORMULARY MEDICATION Magic Cup One cup at lunchtime for weight loss    Historical Provider, MD  amLODipine (NORVASC) 5 MG tablet Take 1 tablet (5 mg total) by mouth daily. 03/17/15   [name], MD  apixaban (ELIQUIS) 5 MG TABS tablet Take 5 mg by mouth 2 (two) times daily.     Historical Provider, MD  cephALEXin (KEFLEX) 500 MG capsule Take 1 capsule (500 mg total) by mouth 2 (two) times daily. 11/23/15   Elnora Morrison, MD  chlordiazePOXIDE (LIBRIUM) 5 MG capsule Take one tablet by mouth at 8am,12noon, and 4pm for anxiety    Historical Provider, MD  chlordiazePOXIDE (LIBRIUM) 5 MG capsule Take 10 mg by mouth at bedtime.    Historical Provider, MD  divalproex (DEPAKOTE SPRINKLE) 125 MG capsule 250 mg 2 (two) times daily. Take two tablets by mouth twice daily  Historical Provider, MD  ferrous sulfate 325 (65 FE) MG tablet Take 1 tablet (325 mg total) by mouth 3 (three) times daily after meals. 03/17/15   [name], MD  folic acid (FOLVITE) 1 MG tablet Take 1 tablet (1 mg total) by mouth daily. 03/17/15   [name], MD  HYDROcodone-acetaminophen (NORCO/VICODIN) 5-325 MG per tablet Take 1-2 tablets by mouth every 6 (six) hours as needed for moderate pain. Patient taking differently: Take one tablet by mouth every 6 hours as needed for pain 03/17/15   Jonetta Osgood, MD  LORazepam (ATIVAN) 0.5 MG tablet Take 0.5 mg by mouth every 8 (eight) hours.    Historical Provider, MD  Multiple Vitamin  (MULTIVITAMIN WITH MINERALS) TABS tablet Take 1 tablet by mouth daily. 03/17/15   [name], MD  QUEtiapine (SEROQUEL) 25 MG tablet Take 12.5mg  by mouth daily at 4pm    Historical Provider, MD  QUEtiapine (SEROQUEL) 50 MG tablet Take 50 mg by mouth at bedtime.     Historical Provider, MD  thiamine 100 MG tablet Take 1 tablet (100 mg total) by mouth daily. 03/17/15   [name], MD  traMADol (ULTRAM) 50 MG tablet Take 50 mg by mouth every 8 (eight) hours.    Historical Provider, MD   BP 145/79 mmHg  Pulse 85  Temp(Src) 98 F (36.7 C) (Oral)  Resp 16  SpO2 100% Physical Exam  Constitutional: She is oriented to person, place, and time. She appears well-developed and well-nourished. No distress.  HENT:  Head: Normocephalic.  1cm laceration to the back of the scalp. Hemostatic. No hemotympanum  Eyes: Conjunctivae and EOM are normal. Pupils are equal, round, and reactive to light.  Neck: Normal range of motion. Neck supple.  No step offs or deformity  Cardiovascular: Normal rate, regular rhythm and normal heart sounds.   Pulmonary/Chest: Effort normal and breath sounds normal. No respiratory distress. She has no wheezes. She has no rales.  Musculoskeletal: She exhibits no edema.  Neurological: She is alert and oriented to person, place, and time.  Moving all extremities. Strength appears to be 5/5 and equal in upper and lower extremities  Skin: Skin is warm and dry.  Psychiatric: She has a normal mood and affect. Her behavior is normal.  Nursing note and vitals reviewed.   ED Course  Procedures (including critical care time) Labs Review Labs Reviewed - No data to display  Imaging Review Dg Chest 1 View  11/23/2015  CLINICAL DATA:  Fall out of bed this morning.  Right hip pain. EXAM: CHEST 1 VIEW COMPARISON:  03/13/2015 FINDINGS: Right base atelectasis or scar. Left lung is clear. Heart is borderline in size. No effusions or pneumothorax. No acute bony abnormality.  IMPRESSION: Minimal right base scarring or atelectasis. Electronically Signed   By: Rolm Baptise M.D.   On: 11/23/2015 08:11   Dg Pelvis 1-2 Views  11/23/2015  CLINICAL DATA:  Fall out of bed this morning on the right side. Right hip pain. EXAM: PELVIS - 1-2 VIEW COMPARISON:  03/15/2015 FINDINGS: Old right femoral neck fracture with superior migration of the femur relative to the femoral head. This is chronic, but further displaced since prior study. No subluxation or dislocation of the femoral head. No visible acute fracture. IMPRESSION: Chronic displaced fracture of the right femoral neck with increasing superior displacement of the right femoral shaft relative to the femoral head. Electronically Signed   By: Rolm Baptise M.D.   On: 11/23/2015 08:11  Dg Wrist Complete Right  11/23/2015  CLINICAL DATA:  Fall out of bed this morning.  Right wrist  bruising EXAM: RIGHT WRIST - COMPLETE 3+ VIEW COMPARISON:  None. FINDINGS: Mild joint space narrowing in the radiocarpal joint. Early degenerative changes at the first carpometacarpal joint. No acute bony abnormality. Specifically, no fracture, subluxation, or dislocation. Soft tissues are intact. IMPRESSION: No acute bony abnormality. Electronically Signed   By: Rolm Baptise M.D.   On: 11/23/2015 08:09   Dg Femur, Min 2 Views Right  11/23/2015  CLINICAL DATA:  70 year old female with acute right hip pain following fall today. Known right femoral neck fracture. EXAM: RIGHT FEMUR 2 VIEWS COMPARISON:  04/08/2015 and prior radiographs. FINDINGS: A right femoral neck fracture is again identified with increased superior displacement when compared to 04/08/2015. Remote inferior and superior right pubic rami fractures are again identified. No other changes are identified. The right femoral head remains located. IMPRESSION: Right femoral neck fracture again identified with further superior displacement when compared to 04/08/2015. No acute fractures identified. Remote  right pubic rami fractures. Electronically Signed   By: Margarette Canada M.D.   On: 11/23/2015 08:12   I have personally reviewed and evaluated these images and lab results as part of my medical decision-making.   EKG Interpretation None      LACERATION REPAIR Performed by: Renold Genta Authorized by: Jeannett Senior A Consent: Verbal consent obtained. Risks and benefits: risks, benefits and alternatives were discussed Consent given by: patient Patient identity confirmed: provided demographic data Prepped and Draped in normal sterile fashion Wound explored  Laceration Location: posterior scalp  Laceration Length: 1cm  No Foreign Bodies seen or palpated  Anesthesia: none  Irrigation method: syringe Amount of cleaning: standard  Skin closure: staple  Number of sutures: 1  Patient tolerance: Patient tolerated the procedure well with no immediate complications.  MDM   Final diagnoses:  Scalp laceration, initial encounter   Patient with head laceration, unclear when laceration took place, whether it happened when she fell earlier this morning or if she had the back of the head on object while in the wheelchair later. Patient appears to be confused at baseline. She is neurovascularly intact. Moving all extremities. Patient is nonambulatory at baseline. Will get CT head and cervical spine. Laceration repaired with a staple.  9:18 PM CT is negative. We'll discharge back to the facility. Staple removal in 7 days.  Filed Vitals:   11/23/15 1835 11/23/15 2032  BP: 145/79 157/80  Pulse: 85 76  Temp: 98 F (36.7 C)   TempSrc: Oral   Resp: 16 18  SpO2: 100% 100%     Jeannett Senior, PA-C 11/24/15 0015  Merrily Pew, MD 11/26/15 1105

## 2015-11-23 NOTE — ED Notes (Signed)
Bed: WA06 Expected date:  Expected time:  Means of arrival:  Comments: No monitor or bed

## 2015-11-23 NOTE — Discharge Instructions (Signed)
If you were given medicines take as directed.  If you are on coumadin or contraceptives realize their levels and effectiveness is altered by many different medicines.  If you have any reaction (rash, tongues swelling, other) to the medicines stop taking and see a physician.   Use tylenol every 4 hrs for pain.  If your blood pressure was elevated in the ER make sure you follow up for management with a primary doctor or return for chest pain, shortness of breath or stroke symptoms.  Please follow up as directed and return to the ER or see a physician for new or worsening symptoms.  Thank you. Filed Vitals:   11/23/15 0711 11/23/15 0714  BP:  133/69  Pulse:  69  Temp:  97.8 F (36.6 C)  TempSrc:  Oral  Resp:  16  Height: 5\' 5"  (1.651 m)   Weight: 117 lb (53.071 kg)   SpO2:  95%

## 2015-11-23 NOTE — ED Notes (Signed)
Bed alarm to bed.

## 2015-11-23 NOTE — Discharge Instructions (Signed)
Follow up for staple removal in 7-10 days. Wound care with regular shampooing. Do not brush hair over the staple. Return if any issues.   CT head and cervical spine negative today.    Laceration Care, Adult A laceration is a cut that goes through all of the layers of the skin and into the tissue that is right under the skin. Some lacerations heal on their own. Others need to be closed with stitches (sutures), staples, skin adhesive strips, or skin glue. Proper laceration care minimizes the risk of infection and helps the laceration to heal better. HOW TO CARE FOR YOUR LACERATION If sutures or staples were used:  Keep the wound clean and dry.  If you were given a bandage (dressing), you should change it at least one time per day or as told by your health care provider. You should also change it if it becomes wet or dirty.  Keep the wound completely dry for the first 24 hours or as told by your health care provider. After that time, you may shower or bathe. However, make sure that the wound is not soaked in water until after the sutures or staples have been removed.  Clean the wound one time each day or as told by your health care provider:  Wash the wound with soap and water.  Rinse the wound with water to remove all soap.  Pat the wound dry with a clean towel. Do not rub the wound.  After cleaning the wound, apply a thin layer of antibiotic ointmentas told by your health care provider. This will help to prevent infection and keep the dressing from sticking to the wound.  Have the sutures or staples removed as told by your health care provider. If skin adhesive strips were used:  Keep the wound clean and dry.  If you were given a bandage (dressing), you should change it at least one time per day or as told by your health care provider. You should also change it if it becomes dirty or wet.  Do not get the skin adhesive strips wet. You may shower or bathe, but be careful to keep the  wound dry.  If the wound gets wet, pat it dry with a clean towel. Do not rub the wound.  Skin adhesive strips fall off on their own. You may trim the strips as the wound heals. Do not remove skin adhesive strips that are still stuck to the wound. They will fall off in time. If skin glue was used:  Try to keep the wound dry, but you may briefly wet it in the shower or bath. Do not soak the wound in water, such as by swimming.  After you have showered or bathed, gently pat the wound dry with a clean towel. Do not rub the wound.  Do not do any activities that will make you sweat heavily until the skin glue has fallen off on its own.  Do not apply liquid, cream, or ointment medicine to the wound while the skin glue is in place. Using those may loosen the film before the wound has healed.  If you were given a bandage (dressing), you should change it at least one time per day or as told by your health care provider. You should also change it if it becomes dirty or wet.  If a dressing is placed over the wound, be careful not to apply tape directly over the skin glue. Doing that may cause the glue to be pulled off  before the wound has healed.  Do not pick at the glue. The skin glue usually remains in place for 5-10 days, then it falls off of the skin. General Instructions  Take over-the-counter and prescription medicines only as told by your health care provider.  If you were prescribed an antibiotic medicine or ointment, take or apply it as told by your doctor. Do not stop using it even if your condition improves.  To help prevent scarring, make sure to cover your wound with sunscreen whenever you are outside after stitches are removed, after adhesive strips are removed, or when glue remains in place and the wound is healed. Make sure to wear a sunscreen of at least 30 SPF.  Do not scratch or pick at the wound.  Keep all follow-up visits as told by your health care provider. This is  important.  Check your wound every day for signs of infection. Watch for:  Redness, swelling, or pain.  Fluid, blood, or pus.  Raise (elevate) the injured area above the level of your heart while you are sitting or lying down, if possible. SEEK MEDICAL CARE IF:  You received a tetanus shot and you have swelling, severe pain, redness, or bleeding at the injection site.  You have a fever.  A wound that was closed breaks open.  You notice a bad smell coming from your wound or your dressing.  You notice something coming out of the wound, such as wood or glass.  Your pain is not controlled with medicine.  You have increased redness, swelling, or pain at the site of your wound.  You have fluid, blood, or pus coming from your wound.  You notice a change in the color of your skin near your wound.  You need to change the dressing frequently due to fluid, blood, or pus draining from the wound.  You develop a new rash.  You develop numbness around the wound. SEEK IMMEDIATE MEDICAL CARE IF:  You develop severe swelling around the wound.  Your pain suddenly increases and is severe.  You develop painful lumps near the wound or on skin that is anywhere on your body.  You have a red streak going away from your wound.  The wound is on your hand or foot and you cannot properly move a finger or toe.  The wound is on your hand or foot and you notice that your fingers or toes look pale or bluish.   This information is not intended to replace advice given to you by your health care provider. Make sure you discuss any questions you have with your health care provider.   Document Released: 09/13/2005 Document Revised: 01/28/2015 Document Reviewed: 09/09/2014 Elsevier Interactive Patient Education 2016 [place] Injury, Adult You have a head injury. Headaches and throwing up (vomiting) are common after a head injury. It should be easy to wake up from sleeping. Sometimes you  must stay in the hospital. Most problems happen within the first 24 hours. Side effects may occur up to 7-10 days after the injury.  WHAT ARE THE TYPES OF HEAD INJURIES? Head injuries can be as minor as a bump. Some head injuries can be more severe. More severe head injuries include:  A jarring injury to the brain (concussion).  A bruise of the brain (contusion). This mean there is bleeding in the brain that can cause swelling.  A cracked skull (skull fracture).  Bleeding in the brain that collects, clots, and forms a bump (hematoma). WHEN  SHOULD I GET HELP RIGHT AWAY?   You are confused or sleepy.  You cannot be woken up.  You feel sick to your stomach (nauseous) or keep throwing up (vomiting).  Your dizziness or unsteadiness is getting worse.  You have very bad, lasting headaches that are not helped by medicine. Take medicines only as told by your doctor.  You cannot use your arms or legs like normal.  You cannot walk.  You notice changes in the black spots in the center of the colored part of your eye (pupil).  You have clear or bloody fluid coming from your nose or ears.  You have trouble seeing. During the next 24 hours after the injury, you must stay with someone who can watch you. This person should get help right away (call 911 in the [place] if you start to shake and are not able to control it (have seizures), you pass out, or you are unable to wake up. HOW CAN I PREVENT A HEAD INJURY IN THE FUTURE?  Wear seat belts.  Wear a helmet while bike riding and playing sports like football.  Stay away from dangerous activities around the house. WHEN CAN I RETURN TO NORMAL ACTIVITIES AND ATHLETICS? See your doctor before doing these activities. You should not do normal activities or play contact sports until 1 week after the following symptoms have stopped:  Headache that does not go away.  Dizziness.  Poor attention.  Confusion.  Memory problems.  Sickness to your  stomach or throwing up.  Tiredness.  Fussiness.  Bothered by bright lights or loud noises.  Anxiousness or depression.  Restless sleep. MAKE SURE YOU:   Understand these instructions.  Will watch your condition.  Will get help right away if you are not doing well or get worse.   This information is not intended to replace advice given to you by your health care provider. Make sure you discuss any questions you have with your health care provider.   Document Released: 08/26/2008 Document Revised: 10/04/2014 Document Reviewed: 05/21/2013 Elsevier Interactive Patient Education [employer].

## 2015-11-23 NOTE — ED Provider Notes (Signed)
Medical screening examination/treatment/procedure(s) were conducted as a shared visit with non-physician practitioner(s) and myself.  I personally evaluated the patient during the encounter.  Poor historian, likely fall today, another traumatic injury to head. No focal neuro deficits. Wound stapled prior to my evaluation.    Merrily Pew, MD 11/23/15 [phone]

## 2015-11-23 NOTE — ED Notes (Addendum)
Pt arrives from [facility] via EMS reporting fall this am getting out of bed.  EMS reports pt had mechanical fall.  Pt reports R leg pain.  Pt denies hitting head, weakness, dizziness, LOC.  Pt states slipped getting up, denies use of blood thinners.  Pt oriented to self, place, situation, disoriented to time.  Per EMS pt at baseline.

## 2015-11-23 NOTE — ED Notes (Signed)
Pt arrived via EMS from [facility] with report of posterior head lac with controlled bleeding. Facility reported etiology unknown. Pt dx with Dementia and unable to tell what happened.

## 2015-11-24 ENCOUNTER — Encounter (HOSPITAL_COMMUNITY): Payer: Self-pay

## 2015-11-24 ENCOUNTER — Emergency Department (HOSPITAL_COMMUNITY): Payer: Medicare Other

## 2015-11-24 ENCOUNTER — Emergency Department (HOSPITAL_COMMUNITY)
Admission: EM | Admit: 2015-11-24 | Discharge: 2015-11-24 | Disposition: A | Discharge status: Home / Self Care | Payer: Medicare Other | Attending: Emergency Medicine | Admitting: Emergency Medicine

## 2015-11-24 DIAGNOSIS — S0101XD Laceration without foreign body of scalp, subsequent encounter: Secondary | ICD-10-CM | POA: Diagnosis not present

## 2015-11-24 DIAGNOSIS — Z8719 Personal history of other diseases of the digestive system: Secondary | ICD-10-CM | POA: Insufficient documentation

## 2015-11-24 DIAGNOSIS — Z7901 Long term (current) use of anticoagulants: Secondary | ICD-10-CM | POA: Insufficient documentation

## 2015-11-24 DIAGNOSIS — F0390 Unspecified dementia without behavioral disturbance: Secondary | ICD-10-CM | POA: Diagnosis not present

## 2015-11-24 DIAGNOSIS — Z792 Long term (current) use of antibiotics: Secondary | ICD-10-CM | POA: Insufficient documentation

## 2015-11-24 DIAGNOSIS — Z85820 Personal history of malignant melanoma of skin: Secondary | ICD-10-CM | POA: Insufficient documentation

## 2015-11-24 DIAGNOSIS — Z8781 Personal history of (healed) traumatic fracture: Secondary | ICD-10-CM | POA: Insufficient documentation

## 2015-11-24 DIAGNOSIS — Z79899 Other long term (current) drug therapy: Secondary | ICD-10-CM | POA: Insufficient documentation

## 2015-11-24 DIAGNOSIS — W19XXXA Unspecified fall, initial encounter: Secondary | ICD-10-CM

## 2015-11-24 DIAGNOSIS — F418 Other specified anxiety disorders: Secondary | ICD-10-CM | POA: Diagnosis not present

## 2015-11-24 DIAGNOSIS — Z86010 Personal history of colonic polyps: Secondary | ICD-10-CM | POA: Diagnosis not present

## 2015-11-24 DIAGNOSIS — W06XXXD Fall from bed, subsequent encounter: Secondary | ICD-10-CM | POA: Diagnosis not present

## 2015-11-24 DIAGNOSIS — Z8739 Personal history of other diseases of the musculoskeletal system and connective tissue: Secondary | ICD-10-CM | POA: Diagnosis not present

## 2015-11-24 LAB — BASIC METABOLIC PANEL WITH GFR
Anion gap: 10 (ref 5–15)
BUN: 14 mg/dL (ref 6–20)
CO2: 23 mmol/L (ref 22–32)
Calcium: 8.9 mg/dL (ref 8.9–10.3)
Chloride: 107 mmol/L (ref 101–111)
Creatinine, Ser: 1.34 mg/dL — ABNORMAL HIGH (ref 0.44–1.00)
GFR calc Af Amer: 46 mL/min — ABNORMAL LOW
GFR calc non Af Amer: 40 mL/min — ABNORMAL LOW
Glucose, Bld: 83 mg/dL (ref 65–99)
Potassium: 3.8 mmol/L (ref 3.5–5.1)
Sodium: 140 mmol/L (ref 135–145)

## 2015-11-24 LAB — CBC
HEMATOCRIT: 29.1 % — AB (ref 36.0–46.0)
HEMOGLOBIN: 9.3 g/dL — AB (ref 12.0–15.0)
MCH: 27.8 pg (ref 26.0–34.0)
MCHC: 32 g/dL (ref 30.0–36.0)
MCV: 86.9 fL (ref 78.0–100.0)
Platelets: 210 10*3/uL (ref 150–400)
RBC: 3.35 MIL/uL — ABNORMAL LOW (ref 3.87–5.11)
RDW: 13.9 % (ref 11.5–15.5)
WBC: 6 10*3/uL (ref 4.0–10.5)

## 2015-11-24 NOTE — Discharge Instructions (Signed)
Please read and follow all provided instructions.  Your diagnoses today include:  1. Fall, initial encounter    Tests performed today include:  CT scan of your head that did not show any serious injury.  Blood counts and electrolytes - no significant changes from yesterday  Vital signs. See below for your results today.   Medications prescribed:   None  Take any prescribed medications only as directed.  Home care instructions:  Follow any educational materials contained in this packet.  Follow-up instructions: Please follow-up with your primary care provider in the next 2 days for further evaluation of your symptoms.   Return instructions:  SEEK IMMEDIATE MEDICAL ATTENTION IF:  There is confusion or drowsiness (although children frequently become drowsy after injury).   You cannot awaken the injured person.   You have more than one episode of vomiting.   You notice dizziness or unsteadiness which is getting worse, or inability to walk.   You have convulsions or unconsciousness.   You experience severe, persistent headaches not relieved by Tylenol.  You cannot use arms or legs normally.   There are changes in pupil sizes. (This is the black center in the colored part of the eye)   There is clear or bloody discharge from the nose or ears.   You have change in speech, vision, swallowing, or understanding.   Localized weakness, numbness, tingling, or change in bowel or bladder control.  You have any other emergent concerns.  Additional Information: You have had a head injury which does not appear to require admission at this time.  Your vital signs today were: BP 118/74 mmHg   Pulse 74   Temp(Src) 98.8 F (37.1 C) (Oral)   Resp 16   SpO2 94% If your blood pressure (BP) was elevated above 135/85 this visit, please have this repeated by your doctor within one month. --------------

## 2015-11-24 NOTE — ED Notes (Signed)
[facility] at bedside 

## 2015-11-24 NOTE — ED Notes (Signed)
[facility]- pt here after a fall at nursing facility. Seen here yesterday for the same. Pt takes Eloquis at home, denies any pain. Pt has hx of dementia but is alert and oriented X3 on arrival.

## 2015-11-24 NOTE — ED Notes (Signed)
[facility] CALLED @ 1031.

## 2015-11-24 NOTE — ED Provider Notes (Signed)
CSN: [id number]     Arrival date & time 11/24/15  [id number] History   First MD Initiated Contact with Patient 11/24/15 [phone]     Chief Complaint  Patient presents with  . Fall    (Consider location/radiation/quality/duration/timing/severity/associated sxs/prior Treatment) HPI Comments: Patient with history of dementia, frequent falls, chronic hip fracture, oral anticoagulation -- presents after a fall. Patient is unclear as to the details of the fall, but states that she got too close to the edge of the bed but does not remember how she fell. Per EMS/nursing home report, she was found on the floor. Patient currently denies any complaints. She is not oriented to time or event. Level V caveat due to dementia.  Patient was seen in emergency department yesterday x 2. Had imaging of hip as well as CT of head and neck. She had a laceration on her scalp which was repaired with one staple.  Patient is a 70 y.o. female presenting with fall. The history is provided by the patient, medical records and the nursing home.  Fall   Past Medical History  Diagnosis Date  . MELANOMA, HX OF 08/04/2007  . ALLERGIC RHINITIS 06/23/2007  . ANXIETY DEPRESSION 06/12/2009  . ANXIETY 06/23/2007  . 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 GERD 06/23/2007  . HYPERLIPIDEMIA 06/24/2007  . HYPERTENSION 06/23/2007  . COLONIC POLYPS, HX OF 03/08/2002  . DIVERTICULOSIS, COLON 08/04/2007  . Irritable bowel syndrome 06/23/2007  . Memory dysfunction 06/23/2011  . Cancer ([place])     skin cx/ hx melanoma  . LOW BACK PAIN 06/24/2007  . Alcohol abuse 02/27/2012   Past Surgical History  Procedure Laterality Date  . Cesarean section      x 2  . Colonoscopy    . Appendectomy     Family History  Problem Relation Age of Onset  . Hypertension Other   . Diabetes Other   . Colon cancer Cousin    Social History  Substance Use Topics  . Smoking status: Never Smoker   . Smokeless tobacco: Never Used  . Alcohol Use: 25.2  oz/week    42 Glasses of wine per week   OB History    No data available     Review of Systems  Unable to perform ROS: Dementia    Allergies  Codeine phosphate  Home Medications   Prior to Admission medications   Medication Sig Start Date End Date Taking? Authorizing Provider  amLODipine (NORVASC) 5 MG tablet Take 1 tablet (5 mg total) by mouth daily. 03/17/15   [name], MD  apixaban (ELIQUIS) 5 MG TABS tablet Take 5 mg by mouth 2 (two) times daily.     Historical Provider, MD  cephALEXin (KEFLEX) 500 MG capsule Take 1 capsule (500 mg total) by mouth 2 (two) times daily. Patient not taking: Reported on 11/23/2015 11/23/15   Elnora Morrison, MD  chlordiazePOXIDE (LIBRIUM) 5 MG capsule Take one tablet by mouth at 8am,12noon, and 4pm for anxiety    Historical Provider, MD  chlordiazePOXIDE (LIBRIUM) 5 MG capsule Take 10 mg by mouth at bedtime.    Historical Provider, MD  divalproex (DEPAKOTE SPRINKLE) 125 MG capsule 250 mg 2 (two) times daily. Take two tablets by mouth twice daily    Historical Provider, MD  ferrous sulfate 325 (65 FE) MG tablet Take 1 tablet (325 mg total) by mouth 3 (three) times daily after meals. 03/17/15   [name], MD  folic acid (FOLVITE) 1 MG tablet Take 1  tablet (1 mg total) by mouth daily. 03/17/15   [name], MD  HYDROcodone-acetaminophen (NORCO/VICODIN) 5-325 MG per tablet Take 1-2 tablets by mouth every 6 (six) hours as needed for moderate pain. Patient not taking: Reported on 11/23/2015 03/17/15   Jonetta Osgood, MD  LORazepam (ATIVAN) 0.5 MG tablet Take 0.5 mg by mouth every 8 (eight) hours.    Historical Provider, MD  Multiple Vitamin (MULTIVITAMIN WITH MINERALS) TABS tablet Take 1 tablet by mouth daily. 03/17/15   [name], MD  mupirocin ointment (BACTROBAN) 2 % Place 1 application into the nose daily. Cleanse left leg with saline, apply ointment then dress with non-stick dressing    Historical Provider, MD  QUEtiapine  (SEROQUEL) 25 MG tablet Take 12.5mg  by mouth daily at 4pm    Historical Provider, MD  QUEtiapine (SEROQUEL) 50 MG tablet Take 50 mg by mouth at bedtime.     Historical Provider, MD  sulfamethoxazole-trimethoprim (BACTRIM DS,SEPTRA DS) 800-160 MG tablet Take 1 tablet by mouth 2 (two) times daily.    Historical Provider, MD  thiamine 100 MG tablet Take 1 tablet (100 mg total) by mouth daily. 03/17/15   [name], MD  traMADol (ULTRAM) 50 MG tablet Take 50 mg by mouth every 8 (eight) hours.    Historical Provider, MD   BP 149/81 mmHg  Pulse 75  Temp(Src) 98.8 F (37.1 C) (Oral)  Resp 16  SpO2 100%   Physical Exam  Constitutional: She is oriented to person, place, and time. She appears well-developed and well-nourished.  HENT:  Head: Normocephalic and atraumatic. Head is without raccoon's eyes and without Battle's sign.  Right Ear: Tympanic membrane, external ear and ear canal normal. No hemotympanum.  Left Ear: Tympanic membrane, external ear and ear canal normal. No hemotympanum.  Nose: Nose normal. No nasal septal hematoma.  Mouth/Throat: Uvula is midline, oropharynx is clear and moist and mucous membranes are normal.  Staple in occiput noted. No current signs of trauma.   Eyes: Conjunctivae, EOM and lids are normal. Pupils are equal, round, and reactive to light. Right eye exhibits no nystagmus. Left eye exhibits no nystagmus.  No visible hyphema noted  Neck: Normal range of motion. Neck supple.  No cervical tenderness.   Cardiovascular: Normal rate and regular rhythm.   No murmur heard. Pulmonary/Chest: Effort normal and breath sounds normal. No respiratory distress. She has no wheezes. She has no rales.  Abdominal: Soft. Bowel sounds are normal. There is no tenderness. There is no rebound and no guarding.  Musculoskeletal:       Cervical back: She exhibits normal range of motion, no tenderness and no bony tenderness.       Thoracic back: She exhibits no tenderness and no bony  tenderness.       Lumbar back: She exhibits no tenderness and no bony tenderness.  Neurological: She is alert and oriented to person, place, and time. She has normal strength and normal reflexes. No cranial nerve deficit or sensory deficit. Coordination normal. GCS eye subscore is 4. GCS verbal subscore is 5. GCS motor subscore is 6.  Skin: Skin is warm and dry.  Psychiatric: She has a normal mood and affect.  Nursing note and vitals reviewed.   ED Course  Procedures (including critical care time) Labs Review Labs Reviewed - No data to display  Imaging Review Dg Chest 1 View  11/23/2015  CLINICAL DATA:  Fall out of bed this morning.  Right hip pain. EXAM: CHEST 1 VIEW  COMPARISON:  03/13/2015 FINDINGS: Right base atelectasis or scar. Left lung is clear. Heart is borderline in size. No effusions or pneumothorax. No acute bony abnormality. IMPRESSION: Minimal right base scarring or atelectasis. Electronically Signed   By: Rolm Baptise M.D.   On: 11/23/2015 08:11   Dg Pelvis 1-2 Views  11/23/2015  CLINICAL DATA:  Fall out of bed this morning on the right side. Right hip pain. EXAM: PELVIS - 1-2 VIEW COMPARISON:  03/15/2015 FINDINGS: Old right femoral neck fracture with superior migration of the femur relative to the femoral head. This is chronic, but further displaced since prior study. No subluxation or dislocation of the femoral head. No visible acute fracture. IMPRESSION: Chronic displaced fracture of the right femoral neck with increasing superior displacement of the right femoral shaft relative to the femoral head. Electronically Signed   By: Rolm Baptise M.D.   On: 11/23/2015 08:11   Dg Wrist Complete Right  11/23/2015  CLINICAL DATA:  Fall out of bed this morning.  Right wrist  bruising EXAM: RIGHT WRIST - COMPLETE 3+ VIEW COMPARISON:  None. FINDINGS: Mild joint space narrowing in the radiocarpal joint. Early degenerative changes at the first carpometacarpal joint. No acute bony abnormality.  Specifically, no fracture, subluxation, or dislocation. Soft tissues are intact. IMPRESSION: No acute bony abnormality. Electronically Signed   By: Rolm Baptise M.D.   On: 11/23/2015 08:09   Ct Head Wo Contrast  11/23/2015  CLINICAL DATA:  Fall striking the back of the head today.  Dementia. EXAM: CT HEAD WITHOUT CONTRAST CT CERVICAL SPINE WITHOUT CONTRAST TECHNIQUE: Multidetector CT imaging of the head and cervical spine was performed following the standard protocol without intravenous contrast. Multiplanar CT image reconstructions of the cervical spine were also generated. COMPARISON:  Multiple exams, including 11/11/2015 and 08/21/2015 FINDINGS: CT HEAD FINDINGS 3 mm old lacunar infarct along the posterior limb left internal capsule. Otherwise, The brainstem, cerebellum, cerebral peduncles, thalami, basal ganglia, basilar cisterns, and ventricular system appear within normal limits. Periventricular white matter and corona radiata hypodensities favor chronic ischemic microvascular white matter disease. No intracranial hemorrhage, mass lesion, or acute CVA. Visualized paranasal sinuses appear clear. There is atherosclerotic calcification of the cavernous carotid arteries bilaterally. CT CERVICAL SPINE FINDINGS Cervical spondylosis noted with loss of disc height at all levels between C3 and C7 and multilevel uncinate and facet spurring potentially contributing to low-level neural foraminal stenosis on the left at C5-6 and C6-7. Fused facet joint on the left at C2- 3. No fracture or acute subluxation is identified. IMPRESSION: 1. No acute intracranial findings or acute cervical spine findings. 2. Periventricular white matter and corona radiata hypodensities favor chronic ischemic microvascular white matter disease. 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 3. Multilevel cervical spondylosis. Electronically Signed   By: Van Clines M.D.   On: 11/23/2015 21:08   Ct Cervical  Spine Wo Contrast  11/23/2015  CLINICAL DATA:  Fall striking the back of the head today.  Dementia. EXAM: CT HEAD WITHOUT CONTRAST CT CERVICAL SPINE WITHOUT CONTRAST TECHNIQUE: Multidetector CT imaging of the head and cervical spine was performed following the standard protocol without intravenous contrast. Multiplanar CT image reconstructions of the cervical spine were also generated. COMPARISON:  Multiple exams, including 11/11/2015 and 08/21/2015 FINDINGS: CT HEAD FINDINGS 3 mm old lacunar infarct along the posterior limb left internal capsule. Otherwise, The brainstem, cerebellum, cerebral peduncles, thalami, basal ganglia, basilar cisterns, and ventricular system appear within normal limits. Periventricular white matter  and corona radiata hypodensities favor chronic ischemic microvascular white matter disease. No intracranial hemorrhage, mass lesion, or acute CVA. Visualized paranasal sinuses appear clear. There is atherosclerotic calcification of the cavernous carotid arteries bilaterally. CT CERVICAL SPINE FINDINGS Cervical spondylosis noted with loss of disc height at all levels between C3 and C7 and multilevel uncinate and facet spurring potentially contributing to low-level neural foraminal stenosis on the left at C5-6 and C6-7. Fused facet joint on the left at C2- 3. No fracture or acute subluxation is identified. IMPRESSION: 1. No acute intracranial findings or acute cervical spine findings. 2. Periventricular white matter and corona radiata hypodensities favor chronic ischemic microvascular white matter disease. 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 3. Multilevel cervical spondylosis. Electronically Signed   By: Van Clines M.D.   On: 11/23/2015 21:08   Dg Femur, Min 2 Views Right  11/23/2015  CLINICAL DATA:  70 year old female with acute right hip pain following fall today. Known right femoral neck fracture. EXAM: RIGHT FEMUR 2 VIEWS COMPARISON:   04/08/2015 and prior radiographs. FINDINGS: A right femoral neck fracture is again identified with increased superior displacement when compared to 04/08/2015. Remote inferior and superior right pubic rami fractures are again identified. No other changes are identified. The right femoral head remains located. IMPRESSION: Right femoral neck fracture again identified with further superior displacement when compared to 04/08/2015. No acute fractures identified. Remote right pubic rami fractures. Electronically Signed   By: Margarette Canada M.D.   On: 11/23/2015 08:12   I have personally reviewed and evaluated these images and lab results as part of my medical decision-making.   EKG Interpretation None       8:53 AM Patient seen and examined. Work-up initiated. Medications ordered.   Vital signs reviewed and are as follows: BP 149/81 mmHg  Pulse 75  Temp(Src) 98.8 F (37.1 C) (Oral)  Resp 16  SpO2 100%  Discussed with Dr. Tomi Bamberger. Will get imaging due to limited history, patient on blood thinner. Will check basic lab work.   10:25 AM Patient discussed with Dr. Tomi Bamberger who has seen. Will d/c. Strongly encouraged PCP f/u to discuss risks/benefits of anticoagulation.    MDM   Final diagnoses:  Fall, initial encounter   Patient with unwitnessed fall on anticoagulation. Head CT is negative. Labs unchanged. Patient with more frequent falls recently. No obvious medical problems which will cause more frequent falling. Patient appears to be at her baseline. Will discharge her back to her facility.   Carlisle Cater, PA-C 11/24/15 [place], MD 11/24/15 [id number]

## 2015-11-24 NOTE — ED Notes (Signed)
Pt transported to CT ?

## 2015-11-27 ENCOUNTER — Telehealth: Payer: Self-pay

## 2015-11-27 NOTE — Telephone Encounter (Signed)
Home Health Cert/Plan of Care received (11/11/2015 - 01/09/2016) and placed on MD's desk for signature  

## 2015-11-28 ENCOUNTER — Encounter (HOSPITAL_COMMUNITY): Payer: Self-pay | Admitting: Emergency Medicine

## 2015-11-28 ENCOUNTER — Emergency Department (HOSPITAL_COMMUNITY)
Admission: EM | Admit: 2015-11-28 | Discharge: 2015-11-29 | Disposition: A | Discharge status: Home / Self Care | Payer: Medicare Other | Attending: Emergency Medicine | Admitting: Emergency Medicine

## 2015-11-28 DIAGNOSIS — S0191XD Laceration without foreign body of unspecified part of head, subsequent encounter: Secondary | ICD-10-CM | POA: Diagnosis not present

## 2015-11-28 DIAGNOSIS — F419 Anxiety disorder, unspecified: Secondary | ICD-10-CM | POA: Diagnosis not present

## 2015-11-28 DIAGNOSIS — Z85820 Personal history of malignant melanoma of skin: Secondary | ICD-10-CM | POA: Insufficient documentation

## 2015-11-28 DIAGNOSIS — I10 Essential (primary) hypertension: Secondary | ICD-10-CM | POA: Insufficient documentation

## 2015-11-28 DIAGNOSIS — Z86010 Personal history of colonic polyps: Secondary | ICD-10-CM | POA: Insufficient documentation

## 2015-11-28 DIAGNOSIS — F329 Major depressive disorder, single episode, unspecified: Secondary | ICD-10-CM | POA: Diagnosis not present

## 2015-11-28 DIAGNOSIS — Z7901 Long term (current) use of anticoagulants: Secondary | ICD-10-CM | POA: Diagnosis not present

## 2015-11-28 DIAGNOSIS — Z792 Long term (current) use of antibiotics: Secondary | ICD-10-CM | POA: Diagnosis not present

## 2015-11-28 DIAGNOSIS — Z5189 Encounter for other specified aftercare: Secondary | ICD-10-CM

## 2015-11-28 DIAGNOSIS — Z79899 Other long term (current) drug therapy: Secondary | ICD-10-CM | POA: Diagnosis not present

## 2015-11-28 DIAGNOSIS — Z8719 Personal history of other diseases of the digestive system: Secondary | ICD-10-CM | POA: Insufficient documentation

## 2015-11-28 DIAGNOSIS — Z4801 Encounter for change or removal of surgical wound dressing: Secondary | ICD-10-CM | POA: Insufficient documentation

## 2015-11-28 DIAGNOSIS — Z8739 Personal history of other diseases of the musculoskeletal system and connective tissue: Secondary | ICD-10-CM | POA: Diagnosis not present

## 2015-11-28 DIAGNOSIS — W1839XD Other fall on same level, subsequent encounter: Secondary | ICD-10-CM | POA: Insufficient documentation

## 2015-11-28 NOTE — ED Notes (Signed)
Patient waiting on PTAR. 

## 2015-11-28 NOTE — ED Notes (Signed)
At charge nurse desk

## 2015-11-28 NOTE — Discharge Instructions (Signed)
Wound Care °Taking care of your wound properly can help to prevent pain and infection. It can also help your wound to heal more quickly.  °HOW TO CARE FOR YOUR WOUND  °· Take or apply over-the-counter and prescription medicines only as told by your health care provider. °· If you were prescribed antibiotic medicine, take or apply it as told by your health care provider. Do not stop using the antibiotic even if your condition improves. °· Clean the wound each day or as told by your health care provider. °¨ Wash the wound with mild soap and water. °¨ Rinse the wound with water to remove all soap. °¨ Pat the wound dry with a clean towel. Do not rub it. °· There are many different ways to close and cover a wound. For example, a wound can be covered with stitches (sutures), skin glue, or adhesive strips. Follow instructions from your health care provider about: °¨ How to take care of your wound. °¨ When and how you should change your bandage (dressing). °¨ When you should remove your dressing. °¨ Removing whatever was used to close your wound. °· Check your wound every day for signs of infection. Watch for: °¨ Redness, swelling, or pain. °¨ Fluid, blood, or pus. °· Keep the dressing dry until your health care provider says it can be removed. Do not take baths, swim, use a hot tub, or do anything that would put your wound underwater until your health care provider approves. °· Raise (elevate) the injured area above the level of your heart while you are sitting or lying down. °· Do not scratch or pick at the wound. °· Keep all follow-up visits as told by your health care provider. This is important. °SEEK MEDICAL CARE IF: °· You received a tetanus shot and you have swelling, severe pain, redness, or bleeding at the injection site. °· You have a fever. °· Your pain is not controlled with medicine. °· You have increased redness, swelling, or pain at the site of your wound. °· You have fluid, blood, or pus coming from your  wound. °· You notice a bad smell coming from your wound or your dressing. °SEEK IMMEDIATE MEDICAL CARE IF: °· You have a red streak going away from your wound. °  °This information is not intended to replace advice given to you by your health care provider. Make sure you discuss any questions you have with your health care provider. °  °Document Released: 06/22/2008 Document Revised: 01/28/2015 Document Reviewed: 09/09/2014 °Elsevier Interactive Patient Education ©2016 [employer] ° °

## 2015-11-28 NOTE — ED Provider Notes (Signed)
CSN: [id number]     Arrival date & time 11/28/15  1859 History   First MD Initiated Contact with Patient 11/28/15 1902     Chief Complaint  Patient presents with  . pulled out staple      (Consider location/radiation/quality/duration/timing/severity/associated sxs/prior Treatment) HPI Comments: Patient presents to the emergency department with chief complaint of wound check.she has a history of dementia, and stays in a memory care unit at an assisted living facility. She was sent to the ED because she ripped out her staple, which was placed 2 days ago following a fall and small head laceration. History is difficult to elicit secondary to dementia. Patient is anticoagulated with Eliquis.  Level 5 Caveat Applies 2/2 dementia.  The history is provided by the patient. No language interpreter was used.    Past Medical History  Diagnosis Date  . MELANOMA, HX OF 08/04/2007  . ALLERGIC RHINITIS 06/23/2007  . ANXIETY DEPRESSION 06/12/2009  . ANXIETY 06/23/2007  . 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 GERD 06/23/2007  . HYPERLIPIDEMIA 06/24/2007  . HYPERTENSION 06/23/2007  . COLONIC POLYPS, HX OF 03/08/2002  . DIVERTICULOSIS, COLON 08/04/2007  . Irritable bowel syndrome 06/23/2007  . Memory dysfunction 06/23/2011  . Cancer ([place])     skin cx/ hx melanoma  . LOW BACK PAIN 06/24/2007  . Alcohol abuse 02/27/2012   Past Surgical History  Procedure Laterality Date  . Cesarean section      x 2  . Colonoscopy    . Appendectomy     Family History  Problem Relation Age of Onset  . Hypertension Other   . Diabetes Other   . Colon cancer Cousin    Social History  Substance Use Topics  . Smoking status: Never Smoker   . Smokeless tobacco: Never Used  . Alcohol Use: 25.2 oz/week    42 Glasses of wine per week   OB History    No data available     Review of Systems  All other systems reviewed and are negative.     Allergies  Codeine phosphate  Home Medications   Prior  to Admission medications   Medication Sig Start Date End Date Taking? Authorizing Provider  amLODipine (NORVASC) 5 MG tablet Take 1 tablet (5 mg total) by mouth daily. 03/17/15   [name], MD  apixaban (ELIQUIS) 5 MG TABS tablet Take 5 mg by mouth 2 (two) times daily.     Historical Provider, MD  chlordiazePOXIDE (LIBRIUM) 5 MG capsule Take one tablet by mouth at 8am,12noon, and 4pm for anxiety    Historical Provider, MD  chlordiazePOXIDE (LIBRIUM) 5 MG capsule Take 10 mg by mouth at bedtime.    Historical Provider, MD  divalproex (DEPAKOTE SPRINKLE) 125 MG capsule Take 250 mg by mouth 2 (two) times daily. Take two tablets by mouth twice daily    Historical Provider, MD  ferrous sulfate 325 (65 FE) MG tablet Take 1 tablet (325 mg total) by mouth 3 (three) times daily after meals. 03/17/15   [name], MD  folic acid (FOLVITE) 1 MG tablet Take 1 tablet (1 mg total) by mouth daily. 03/17/15   [name], MD  HYDROcodone-acetaminophen (NORCO/VICODIN) 5-325 MG per tablet Take 1-2 tablets by mouth every 6 (six) hours as needed for moderate pain. Patient not taking: Reported on 11/23/2015 03/17/15   Jonetta Osgood, MD  LORazepam (ATIVAN) 0.5 MG tablet Take 0.5 mg by mouth every 8 (eight) hours.    Historical Provider,  MD  Multiple Vitamin (MULTIVITAMIN WITH MINERALS) TABS tablet Take 1 tablet by mouth daily. 03/17/15   [name], MD  mupirocin ointment (BACTROBAN) 2 % Place 1 application into the nose daily. Cleanse left leg with saline, apply ointment then dress with non-stick dressing    Historical Provider, MD  QUEtiapine (SEROQUEL) 25 MG tablet Take 12.5mg  by mouth daily at 4pm    Historical Provider, MD  QUEtiapine (SEROQUEL) 50 MG tablet Take 50 mg by mouth at bedtime.     Historical Provider, MD  sulfamethoxazole-trimethoprim (BACTRIM DS,SEPTRA DS) 800-160 MG tablet Take 1 tablet by mouth 2 (two) times daily.    Historical Provider, MD  thiamine 100 MG tablet Take 1  tablet (100 mg total) by mouth daily. 03/17/15   [name], MD  traMADol (ULTRAM) 50 MG tablet Take 50 mg by mouth every 8 (eight) hours.    Historical Provider, MD   BP 149/76 mmHg  Pulse 80  Temp(Src) 97.8 F (36.6 C) (Oral)  Resp 13  Ht 5\' 5"  (1.651 m)  Wt 53.071 kg  BMI 19.47 kg/m2  SpO2 96% Physical Exam  Constitutional: She is oriented to person, place, and time. She appears well-developed and well-nourished.  HENT:  Head: Normocephalic and atraumatic.  Eyes: Conjunctivae and EOM are normal. Pupils are equal, round, and reactive to light.  Neck: Normal range of motion. Neck supple.  Cardiovascular: Normal rate and regular rhythm.  Exam reveals no gallop and no friction rub.   No murmur heard. Pulmonary/Chest: Effort normal and breath sounds normal. No respiratory distress. She has no wheezes. She has no rales. She exhibits no tenderness.  Abdominal: Soft. Bowel sounds are normal. She exhibits no distension and no mass. There is no tenderness. There is no rebound and no guarding.  Musculoskeletal: Normal range of motion. She exhibits no edema or tenderness.  Neurological: She is alert and oriented to person, place, and time.  Skin: Skin is warm and dry.  Small head laceration, staple halfway in, bleeding controlled  Psychiatric: She has a normal mood and affect. Her behavior is normal. Judgment and thought content normal.  Nursing note and vitals reviewed.   ED Course  Procedures (including critical care time) SUTURE REMOVAL Performed by: Montine Circle  Consent: Verbal consent obtained. Consent given by: patient Required items: required blood products, implants, devices, and special equipment available Time out: Immediately prior to procedure a "time out" was called to verify the correct patient, procedure, equipment, support staff and site/side marked as required.  Location: posterior scalp  Wound Appearance: clean  Sutures/Staples Removed: 1  Patient  tolerance: Patient tolerated the procedure well with no immediate complications.     MDM   Final diagnoses:  Visit for wound check    Patient here for wound check.  From memory care facility.  Has dementia.  Pulled staple halfway out.  Staple removed in ED.  Bleeding controlled.  Will allow for healing by secondary intention.    Montine Circle, PA-C 11/28/15 [place] Yao, MD 11/28/15 [phone]

## 2015-11-28 NOTE — ED Notes (Signed)
Per EMS-pulled put staple from back of head-no bleeding

## 2015-11-28 NOTE — ED Notes (Signed)
Patient states she was combing hair and staples came out. Patient has one staple that is almost out. Wound is open and bleeding has stop.

## 2015-11-28 NOTE — ED Notes (Signed)
Bed: WA07 Expected date:  Expected time:  Means of arrival:  Comments: EMS- dementia; pulled staples from head lac

## 2015-12-02 ENCOUNTER — Telehealth: Payer: Self-pay | Admitting: Internal Medicine

## 2015-12-02 NOTE — Telephone Encounter (Signed)
Received signed and faxed to [facility]  Copy sent to scan and copy placed in Dahlia's office with charge capture sheet

## 2015-12-02 NOTE — Telephone Encounter (Signed)
Pamala Hurry, nurse from [facility], called request order for: Silver alginate to open wound left lower extremity, unna boot application to bilateral lower extremity if pt is compressible, if unknown, need an order for ABI loosely apply roll gauze with coban over alginate right lower extremity until ABI result return.    [phone]

## 2015-12-04 ENCOUNTER — Telehealth: Payer: Self-pay | Admitting: Internal Medicine

## 2015-12-04 NOTE — Telephone Encounter (Signed)
Forwarding to providers.

## 2015-12-04 NOTE — Telephone Encounter (Signed)
Please give Tara Shaffer a call to talk about the face to face that we sent over today. The visit listed on the form apparently states 11/01/2015, but we only show a no show for 11/12/2015. We are going to have to fit the patient in within the next few days to stay within medicare guidelines to begin care with them.

## 2015-12-09 ENCOUNTER — Emergency Department (HOSPITAL_COMMUNITY)
Admission: EM | Admit: 2015-12-09 | Discharge: 2015-12-10 | Disposition: A | Discharge status: Home / Self Care | Payer: Medicare Other | Attending: Emergency Medicine | Admitting: Emergency Medicine

## 2015-12-09 ENCOUNTER — Encounter (HOSPITAL_COMMUNITY): Payer: Self-pay | Admitting: Emergency Medicine

## 2015-12-09 DIAGNOSIS — Z8639 Personal history of other endocrine, nutritional and metabolic disease: Secondary | ICD-10-CM | POA: Diagnosis not present

## 2015-12-09 DIAGNOSIS — W07XXXA Fall from chair, initial encounter: Secondary | ICD-10-CM | POA: Insufficient documentation

## 2015-12-09 DIAGNOSIS — F418 Other specified anxiety disorders: Secondary | ICD-10-CM | POA: Diagnosis not present

## 2015-12-09 DIAGNOSIS — Z79899 Other long term (current) drug therapy: Secondary | ICD-10-CM | POA: Insufficient documentation

## 2015-12-09 DIAGNOSIS — S51802A Unspecified open wound of left forearm, initial encounter: Secondary | ICD-10-CM | POA: Insufficient documentation

## 2015-12-09 DIAGNOSIS — Y9389 Activity, other specified: Secondary | ICD-10-CM | POA: Diagnosis not present

## 2015-12-09 DIAGNOSIS — Y998 Other external cause status: Secondary | ICD-10-CM | POA: Insufficient documentation

## 2015-12-09 DIAGNOSIS — S59912A Unspecified injury of left forearm, initial encounter: Secondary | ICD-10-CM | POA: Diagnosis present

## 2015-12-09 DIAGNOSIS — Z86010 Personal history of colonic polyps: Secondary | ICD-10-CM | POA: Diagnosis not present

## 2015-12-09 DIAGNOSIS — Z85820 Personal history of malignant melanoma of skin: Secondary | ICD-10-CM | POA: Insufficient documentation

## 2015-12-09 DIAGNOSIS — I10 Essential (primary) hypertension: Secondary | ICD-10-CM | POA: Diagnosis not present

## 2015-12-09 DIAGNOSIS — Y9289 Other specified places as the place of occurrence of the external cause: Secondary | ICD-10-CM | POA: Diagnosis not present

## 2015-12-09 DIAGNOSIS — W19XXXA Unspecified fall, initial encounter: Secondary | ICD-10-CM

## 2015-12-09 DIAGNOSIS — S51812A Laceration without foreign body of left forearm, initial encounter: Secondary | ICD-10-CM | POA: Insufficient documentation

## 2015-12-09 DIAGNOSIS — F0390 Unspecified dementia without behavioral disturbance: Secondary | ICD-10-CM | POA: Insufficient documentation

## 2015-12-09 DIAGNOSIS — Z8719 Personal history of other diseases of the digestive system: Secondary | ICD-10-CM | POA: Insufficient documentation

## 2015-12-09 NOTE — Discharge Instructions (Signed)
Please read and follow all provided instructions.  Your diagnoses today include:  1. Fall, initial encounter   2. Skin tear of forearm without complication, left, initial encounter     Tests performed today include:  Vital signs. See below for your results today.   Medications prescribed:   None  Home care instructions:  Follow any educational materials contained in this packet.  Follow-up instructions: Please follow-up with your primary care provider as needed for further evaluation of your symptoms.  Return instructions:   Please return to the Emergency Department if you experience worsening symptoms.   Please return if you have any other emergent concerns.  Additional Information:  Your vital signs today were: BP 139/88 mmHg   Pulse 69   Temp(Src) 97.4 F (36.3 C)   Resp 18   SpO2 94% If your blood pressure (BP) was elevated above 135/85 this visit, please have this repeated by your doctor within one month. ---------------

## 2015-12-09 NOTE — ED Provider Notes (Signed)
CSN: [id number]     Arrival date & time 12/09/15  2209 History   First MD Initiated Contact with Patient 12/09/15 2220     Chief Complaint  Patient presents with  . Fall     (Consider location/radiation/quality/duration/timing/severity/associated sxs/prior Treatment) HPI Comments: Patient with history of dementia presents after a witnessed fall from chair to floor. No indication of head injury. Patient sustained small skin tear to her left forearm. Otherwise no complaints. No other reported injury. Patient does take apixaban.   Level V caveat due to dementia.  Patient is a 70 y.o. female presenting with fall. The history is provided by the EMS personnel and medical records.  Fall    Past Medical History  Diagnosis Date  . MELANOMA, HX OF 08/04/2007  . ALLERGIC RHINITIS 06/23/2007  . ANXIETY DEPRESSION 06/12/2009  . ANXIETY 06/23/2007  . 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 GERD 06/23/2007  . HYPERLIPIDEMIA 06/24/2007  . HYPERTENSION 06/23/2007  . COLONIC POLYPS, HX OF 03/08/2002  . DIVERTICULOSIS, COLON 08/04/2007  . Irritable bowel syndrome 06/23/2007  . Memory dysfunction 06/23/2011  . Cancer ([place])     skin cx/ hx melanoma  . LOW BACK PAIN 06/24/2007  . Alcohol abuse 02/27/2012   Past Surgical History  Procedure Laterality Date  . Cesarean section      x 2  . Colonoscopy    . Appendectomy     Family History  Problem Relation Age of Onset  . Hypertension Other   . Diabetes Other   . Colon cancer Cousin    Social History  Substance Use Topics  . Smoking status: Never Smoker   . Smokeless tobacco: Never Used  . Alcohol Use: 25.2 oz/week    42 Glasses of wine per week   OB History    No data available     Review of Systems  Unable to perform ROS: Dementia    Allergies  Codeine phosphate  Home Medications   Prior to Admission medications   Medication Sig Start Date End Date Taking? Authorizing Provider  amLODipine (NORVASC) 5 MG tablet Take 1  tablet (5 mg total) by mouth daily. 03/17/15  Yes [name], MD  apixaban (ELIQUIS) 5 MG TABS tablet Take 5 mg by mouth 2 (two) times daily.    Yes Historical Provider, MD  chlordiazePOXIDE (LIBRIUM) 5 MG capsule Take one tablet by mouth at 8am,12noon, and 4pm for anxiety   Yes Historical Provider, MD  chlordiazePOXIDE (LIBRIUM) 5 MG capsule Take 10 mg by mouth at bedtime.   Yes Historical Provider, MD  divalproex (DEPAKOTE SPRINKLE) 125 MG capsule Take 250 mg by mouth 2 (two) times daily. Take two tablets by mouth twice daily   Yes Historical Provider, MD  doxycycline (VIBRA-TABS) 100 MG tablet Take 100 mg by mouth 2 (two) times daily. ABT Start Date 12/01/15 & End Date 12/10/15.   Yes Historical Provider, MD  ferrous sulfate 325 (65 FE) MG tablet Take 1 tablet (325 mg total) by mouth 3 (three) times daily after meals. 03/17/15  Yes [name], MD  folic acid (FOLVITE) 1 MG tablet Take 1 tablet (1 mg total) by mouth daily. 03/17/15  Yes [name], MD  hydrocortisone 2.5 % cream Apply 1 application topically 3 (three) times daily.   Yes Historical Provider, MD  LORazepam (ATIVAN) 0.5 MG tablet Take 0.5 mg by mouth every 8 (eight) hours.   Yes Historical Provider, MD  Multiple Vitamin (MULTIVITAMIN WITH MINERALS) TABS tablet  Take 1 tablet by mouth daily. 03/17/15  Yes [name], MD  QUEtiapine (SEROQUEL) 50 MG tablet Take 25-50 mg by mouth 2 (two) times daily. Take 0.5 tablet (25 mg) in the am and Take 1 tablet (50 mg) in the evening.   Yes Historical Provider, MD  thiamine 100 MG tablet Take 1 tablet (100 mg total) by mouth daily. 03/17/15  Yes [name], MD  traMADol (ULTRAM) 50 MG tablet Take 50 mg by mouth every 8 (eight) hours.   Yes Historical Provider, MD  HYDROcodone-acetaminophen (NORCO/VICODIN) 5-325 MG per tablet Take 1-2 tablets by mouth every 6 (six) hours as needed for moderate pain. Patient not taking: Reported on 11/23/2015 03/17/15   Jonetta Osgood, MD    BP 139/88 mmHg  Pulse 69  Temp(Src) 97.4 F (36.3 C)  Resp 18  SpO2 94%   Physical Exam  Constitutional: She appears well-developed and well-nourished.  HENT:  Head: Normocephalic and atraumatic.  Right Ear: Tympanic membrane, external ear and ear canal normal.  Left Ear: Tympanic membrane, external ear and ear canal normal.  Nose: Nose normal.  Mouth/Throat: Uvula is midline, oropharynx is clear and moist and mucous membranes are normal.  Eyes: Conjunctivae, EOM and lids are normal. Pupils are equal, round, and reactive to light. Right eye exhibits no nystagmus. Left eye exhibits no nystagmus.  Neck: Normal range of motion. Neck supple.  Cardiovascular: Normal rate and regular rhythm.   No murmur heard. Pulmonary/Chest: Effort normal and breath sounds normal. No respiratory distress. She has no wheezes. She has no rales.  Abdominal: Soft. There is no tenderness.  Musculoskeletal:       Right shoulder: Normal.       Left shoulder: Normal.       Right elbow: Normal.      Left elbow: Normal.       Right wrist: Normal.       Left wrist: Normal.       Right hip: She exhibits normal range of motion.       Left hip: Normal. She exhibits normal range of motion.       Right knee: Normal. She exhibits normal range of motion.       Left knee: Normal. She exhibits normal range of motion.       Right ankle: No tenderness.       Left ankle: No tenderness.       Cervical back: She exhibits normal range of motion, no tenderness and no bony tenderness.       Left forearm: She exhibits laceration.  Neurological: She is alert. She has normal strength and normal reflexes. No cranial nerve deficit or sensory deficit. She displays a negative Romberg sign. Coordination and gait normal. GCS eye subscore is 4. GCS verbal subscore is 5. GCS motor subscore is 6.  Skin: Skin is warm and dry.  Small 1 cm skin tear, hemostatic, to left forearm.  Psychiatric: She has a normal mood and affect.  Nursing  note and vitals reviewed.   ED Course  Procedures (including critical care time) Labs Review Labs Reviewed - No data to display  Imaging Review No results found. I have personally reviewed and evaluated these images and lab results as part of my medical decision-making.   EKG Interpretation None       10:31 PM Patient seen and examined. No reported head injury. Patient does not have any complaints. Small skin tear noted.   Vital signs reviewed and are  as follows: BP 139/88 mmHg  Pulse 69  Temp(Src) 97.4 F (36.3 C)  Resp 18  SpO2 94%  11:39 PM Patient discussed with and seen by Dr. Ashok Cordia. No indication of head or neck injury. No indication of hip injury. No indication for further imaging at this point.  Encouraged PCP follow-up for consideration of d/c anticoaguation.   MDM   Final diagnoses:  Fall, initial encounter  Skin tear of forearm without complication, left, initial encounter   Patient reported witnessed fall, slipped out of a chair onto the floor. No reported or parent head or neck injury grade patient moves all extremities. She is demented at baseline. Feel that she is safe for discharge back to her nursing facility at this time.   Carlisle Cater, PA-C 12/10/15 0015  Lajean Saver, MD 12/10/15 [phone]

## 2015-12-09 NOTE — ED Notes (Signed)
Bed: WA17 Expected date:  Expected time:  Means of arrival:  Comments: Fall,

## 2015-12-09 NOTE — ED Notes (Signed)
Pt presents via EMS from [facility] after a witnessed fall from chair to floor, no LOC, no deformities noted. Left forearm skin tear. Appropriate at baseline per facility staff. Vitals WNL per EMS.

## 2015-12-12 ENCOUNTER — Encounter (HOSPITAL_COMMUNITY): Payer: Self-pay | Admitting: Emergency Medicine

## 2015-12-12 ENCOUNTER — Emergency Department (HOSPITAL_COMMUNITY)
Admission: EM | Admit: 2015-12-12 | Discharge: 2015-12-12 | Disposition: A | Discharge status: Home / Self Care | Payer: Medicare Other | Attending: Emergency Medicine | Admitting: Emergency Medicine

## 2015-12-12 DIAGNOSIS — Z85820 Personal history of malignant melanoma of skin: Secondary | ICD-10-CM | POA: Insufficient documentation

## 2015-12-12 DIAGNOSIS — S199XXA Unspecified injury of neck, initial encounter: Secondary | ICD-10-CM | POA: Insufficient documentation

## 2015-12-12 DIAGNOSIS — Y9389 Activity, other specified: Secondary | ICD-10-CM | POA: Diagnosis not present

## 2015-12-12 DIAGNOSIS — Z8719 Personal history of other diseases of the digestive system: Secondary | ICD-10-CM | POA: Diagnosis not present

## 2015-12-12 DIAGNOSIS — Z8639 Personal history of other endocrine, nutritional and metabolic disease: Secondary | ICD-10-CM | POA: Insufficient documentation

## 2015-12-12 DIAGNOSIS — F0390 Unspecified dementia without behavioral disturbance: Secondary | ICD-10-CM | POA: Insufficient documentation

## 2015-12-12 DIAGNOSIS — Y92128 Other place in nursing home as the place of occurrence of the external cause: Secondary | ICD-10-CM | POA: Insufficient documentation

## 2015-12-12 DIAGNOSIS — Z711 Person with feared health complaint in whom no diagnosis is made: Secondary | ICD-10-CM | POA: Insufficient documentation

## 2015-12-12 DIAGNOSIS — Z86010 Personal history of colonic polyps: Secondary | ICD-10-CM | POA: Insufficient documentation

## 2015-12-12 DIAGNOSIS — F418 Other specified anxiety disorders: Secondary | ICD-10-CM | POA: Insufficient documentation

## 2015-12-12 DIAGNOSIS — I10 Essential (primary) hypertension: Secondary | ICD-10-CM | POA: Insufficient documentation

## 2015-12-12 DIAGNOSIS — Z79899 Other long term (current) drug therapy: Secondary | ICD-10-CM | POA: Diagnosis not present

## 2015-12-12 DIAGNOSIS — Z7952 Long term (current) use of systemic steroids: Secondary | ICD-10-CM | POA: Diagnosis not present

## 2015-12-12 DIAGNOSIS — Y9289 Other specified places as the place of occurrence of the external cause: Secondary | ICD-10-CM | POA: Diagnosis not present

## 2015-12-12 DIAGNOSIS — Y998 Other external cause status: Secondary | ICD-10-CM | POA: Diagnosis not present

## 2015-12-12 DIAGNOSIS — Z8739 Personal history of other diseases of the musculoskeletal system and connective tissue: Secondary | ICD-10-CM | POA: Diagnosis not present

## 2015-12-12 DIAGNOSIS — Z85828 Personal history of other malignant neoplasm of skin: Secondary | ICD-10-CM | POA: Diagnosis not present

## 2015-12-12 DIAGNOSIS — W1839XA Other fall on same level, initial encounter: Secondary | ICD-10-CM | POA: Insufficient documentation

## 2015-12-12 DIAGNOSIS — W19XXXA Unspecified fall, initial encounter: Secondary | ICD-10-CM

## 2015-12-12 DIAGNOSIS — Z043 Encounter for examination and observation following other accident: Secondary | ICD-10-CM | POA: Diagnosis present

## 2015-12-12 NOTE — ED Notes (Signed)
Bed: [id number] Expected date:  Expected time:  Means of arrival:  Comments: 48F/fall

## 2015-12-12 NOTE — Discharge Instructions (Signed)
Head Injury, Adult Precautions At this time, no injuries are identified from the patient's fall. Precautions are outlined below for possible head injury. Patient was able to move her extremities at her baseline. She did not exhibit pain with range of motion of the upper extremities or with compression over the hips or range of motion as tolerated with baseline flexion contracture. Observe the patient for other signs of pain or dysfunction. She should be rechecked by the facility provider within 24-48 hours. You have received a head injury. It does not appear serious at this time. Headaches and vomiting are common following head injury. It should be easy to awaken from sleeping. Sometimes it is necessary for you to stay in the emergency department for a while for observation. Sometimes admission to the hospital may be needed. After injuries such as yours, most problems occur within the first 24 hours, but side effects may occur up to 7-10 days after the injury. It is important for you to carefully monitor your condition and contact your health care provider or seek immediate medical care if there is a change in your condition. WHAT ARE THE TYPES OF HEAD INJURIES? Head injuries can be as minor as a bump. Some head injuries can be more severe. More severe head injuries include:  A jarring injury to the brain (concussion).  A bruise of the brain (contusion). This mean there is bleeding in the brain that can cause swelling.  A cracked skull (skull fracture).  Bleeding in the brain that collects, clots, and forms a bump (hematoma). WHAT CAUSES A HEAD INJURY? A serious head injury is most likely to happen to someone who is in a car wreck and is not wearing a seat belt. Other causes of major head injuries include bicycle or motorcycle accidents, sports injuries, and falls. HOW ARE HEAD INJURIES DIAGNOSED? A complete history of the event leading to the injury and your current symptoms will be helpful in  diagnosing head injuries. Many times, pictures of the brain, such as CT or MRI are needed to see the extent of the injury. Often, an overnight hospital stay is necessary for observation.  WHEN SHOULD I SEEK IMMEDIATE MEDICAL CARE?  You should get help right away if:  You have confusion or drowsiness.  You feel sick to your stomach (nauseous) or have continued, forceful vomiting.  You have dizziness or unsteadiness that is getting worse.  You have severe, continued headaches not relieved by medicine. Only take over-the-counter or prescription medicines for pain, fever, or discomfort as directed by your health care provider.  You do not have normal function of the arms or legs or are unable to walk.  You notice changes in the black spots in the center of the colored part of your eye (pupil).  You have a clear or bloody fluid coming from your nose or ears.  You have a loss of vision. During the next 24 hours after the injury, you must stay with someone who can watch you for the warning signs. This person should contact local emergency services (911 in the [place] if you have seizures, you become unconscious, or you are unable to wake up. HOW CAN I PREVENT A HEAD INJURY IN THE FUTURE? The most important factor for preventing major head injuries is avoiding motor vehicle accidents. To minimize the potential for damage to your head, it is crucial to wear seat belts while riding in motor vehicles. Wearing helmets while bike riding and playing collision sports (like football) is also  helpful. Also, avoiding dangerous activities around the house will further help reduce your risk of head injury.  WHEN CAN I RETURN TO NORMAL ACTIVITIES AND ATHLETICS? You should be reevaluated by your health care provider before returning to these activities. If you have any of the following symptoms, you should not return to activities or contact sports until 1 week after the symptoms have stopped:  Persistent  headache.  Dizziness or vertigo.  Poor attention and concentration.  Confusion.  Memory problems.  Nausea or vomiting.  Fatigue or tire easily.  Irritability.  Intolerant of bright lights or loud noises.  Anxiety or depression.  Disturbed sleep. MAKE SURE YOU:   Understand these instructions.  Will watch your condition.  Will get help right away if you are not doing well or get worse.   This information is not intended to replace advice given to you by your health care provider. Make sure you discuss any questions you have with your health care provider.   Document Released: 09/13/2005 Document Revised: 10/04/2014 Document Reviewed: 05/21/2013 Elsevier Interactive Patient Education [employer].

## 2015-12-12 NOTE — Discharge Instructions (Signed)
Return here as needed.  Follow-up with your primary care doctor °

## 2015-12-12 NOTE — ED Notes (Signed)
[facility] called  

## 2015-12-12 NOTE — Progress Notes (Signed)
CSW met with patient at bedside. There was no family present. Patient confirms that she presents to [facility] due to fall. CSW asked patient what caused her to fall, and patient stated " I think some of the black guys scare me. They have too much freedom".  Per note, patient is from [place] and was found propped against the wall.   Patient denies falling often. Patient states that she feels as though she has a good support system.  Patient has no questions for CSW at this time.  Willette Brace [phone] ED CSW 12/12/2015 6:57 PM

## 2015-12-12 NOTE — ED Notes (Signed)
Patient arrived via EMS.  Staff at [facility] found patient propped against a wall. Per staff patient complains of right shoulder pain. Upon triage, patient denies pain.  Moving all extremities appropriately.

## 2015-12-12 NOTE — ED Provider Notes (Signed)
CSN: [id number]     Arrival date & time 12/12/15  1732 History   First MD Initiated Contact with Patient 12/12/15 1747     No chief complaint on file.    (Consider location/radiation/quality/duration/timing/severity/associated sxs/prior Treatment) HPI Patient presents to the emergency department from a nursing facility.  The facility states that she was sitting on the floor next to her wheelchair, but no signs of injury.  Patient was seen earlier today for a fall.  Patient complains of no pain.  The patient denies any other symptoms.  Patient advised that she did not fall.  She states she fell earlier, but did not fall out of the wheelchair Past Medical History  Diagnosis Date  . MELANOMA, HX OF 08/04/2007  . ALLERGIC RHINITIS 06/23/2007  . ANXIETY DEPRESSION 06/12/2009  . ANXIETY 06/23/2007  . 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 GERD 06/23/2007  . HYPERLIPIDEMIA 06/24/2007  . HYPERTENSION 06/23/2007  . COLONIC POLYPS, HX OF 03/08/2002  . DIVERTICULOSIS, COLON 08/04/2007  . Irritable bowel syndrome 06/23/2007  . Memory dysfunction 06/23/2011  . Cancer ([place])     skin cx/ hx melanoma  . LOW BACK PAIN 06/24/2007  . Alcohol abuse 02/27/2012   Past Surgical History  Procedure Laterality Date  . Cesarean section      x 2  . Colonoscopy    . Appendectomy     Family History  Problem Relation Age of Onset  . Hypertension Other   . Diabetes Other   . Colon cancer Cousin    Social History  Substance Use Topics  . Smoking status: Never Smoker   . Smokeless tobacco: Never Used  . Alcohol Use: 25.2 oz/week    42 Glasses of wine per week   OB History    No data available     Review of Systems  All other systems negative except as documented in the HPI. All pertinent positives and negatives as reviewed in the HPI.  Allergies  Codeine phosphate  Home Medications   Prior to Admission medications   Medication Sig Start Date End Date Taking? Authorizing Provider   amLODipine (NORVASC) 5 MG tablet Take 1 tablet (5 mg total) by mouth daily. 03/17/15  Yes [name], MD  apixaban (ELIQUIS) 5 MG TABS tablet Take 5 mg by mouth 2 (two) times daily.    Yes Historical Provider, MD  chlordiazePOXIDE (LIBRIUM) 5 MG capsule Take one tablet by mouth at 8am,12noon, and 4pm for anxiety   Yes Historical Provider, MD  chlordiazePOXIDE (LIBRIUM) 5 MG capsule Take 10 mg by mouth at bedtime.   Yes Historical Provider, MD  divalproex (DEPAKOTE SPRINKLE) 125 MG capsule Take 250 mg by mouth 2 (two) times daily. Take two tablets by mouth twice daily   Yes Historical Provider, MD  ferrous sulfate 325 (65 FE) MG tablet Take 1 tablet (325 mg total) by mouth 3 (three) times daily after meals. 03/17/15  Yes [name], MD  folic acid (FOLVITE) 1 MG tablet Take 1 tablet (1 mg total) by mouth daily. 03/17/15  Yes [name], MD  hydrocortisone 2.5 % cream Apply 1 application topically 3 (three) times daily.   Yes Historical Provider, MD  LORazepam (ATIVAN) 0.5 MG tablet Take 0.5 mg by mouth every 8 (eight) hours.   Yes Historical Provider, MD  Multiple Vitamin (MULTIVITAMIN WITH MINERALS) TABS tablet Take 1 tablet by mouth daily. 03/17/15  Yes [name], MD  QUEtiapine (SEROQUEL) 50 MG tablet Take 25-50  mg by mouth 2 (two) times daily. Take 0.5 tablet (25 mg) in the am and Take 1 tablet (50 mg) in the evening.   Yes Historical Provider, MD  risperiDONE (RISPERDAL) 1 MG tablet Take 1 mg by mouth daily.   Yes Historical Provider, MD  thiamine 100 MG tablet Take 1 tablet (100 mg total) by mouth daily. 03/17/15  Yes [name], MD  traMADol (ULTRAM) 50 MG tablet Take 50 mg by mouth every 8 (eight) hours.   Yes Historical Provider, MD   BP 156/138 mmHg  Pulse 77  Temp(Src) 97.6 F (36.4 C) (Oral)  Resp 20  SpO2 100% Physical Exam  Constitutional: She appears well-developed and well-nourished. No distress.  HENT:  Head: Normocephalic and atraumatic.   Mouth/Throat: Oropharynx is clear and moist.  Eyes: Pupils are equal, round, and reactive to light.  Neck: Normal range of motion. Neck supple.  Cardiovascular: Normal rate, regular rhythm and normal heart sounds.  Exam reveals no gallop and no friction rub.   No murmur heard. Pulmonary/Chest: Effort normal and breath sounds normal. No respiratory distress. She has no wheezes.  Abdominal: Soft. Bowel sounds are normal. She exhibits no distension. There is no tenderness.  Musculoskeletal:       Right shoulder: Normal.       Left shoulder: Normal.       Right elbow: Normal.      Left elbow: Normal.       Right hip: Normal.       Left hip: Normal.       Right knee: Normal.       Left knee: Normal.       Right ankle: Normal.       Left ankle: Normal.       Cervical back: Normal.       Thoracic back: Normal.       Lumbar back: Normal.  Neurological: She is alert. She exhibits normal muscle tone. Coordination normal.  Skin: Skin is warm and dry. No rash noted. No erythema.  Psychiatric: She has a normal mood and affect. Her behavior is normal.  Nursing note and vitals reviewed.   ED Course  Procedures (including critical care time)   The patient does not have any.  Injuries at this time.  Head to toe Assessment of the patient finds no sources of pain or deformity.  The patient will be sent back to the nursing facility    [facility], PA-C 12/12/15 1921  Dorie Rank, MD 12/12/15 [phone]

## 2015-12-12 NOTE — ED Notes (Signed)
Per EMS, from [facility], patient fell from wheelchair.  Complains of right sided leg pain.  No crepitus or pain noted on palpation.  Patient has a c-collar in place.  Alert and oriented per normal with a history of dementia.

## 2015-12-12 NOTE — ED Notes (Signed)
Dr. Pfeiffer at bedside   

## 2015-12-12 NOTE — ED Notes (Signed)
Lawyer, [place] notified of patient status and complaint.

## 2015-12-12 NOTE — ED Notes (Signed)
PT DISCHARGED. INSTRUCTIONS GIVEN TO [facility] STAFF. AAOX1. PT IN NO APPARENT DISTRESS OR PAIN. THE OPPORTUNITY TO ASK QUESTIONS WAS PROVIDED.

## 2015-12-12 NOTE — ED Provider Notes (Signed)
CSN: [id number]     Arrival date & time 12/12/15  1140 History   First MD Initiated Contact with Patient 12/12/15 1145     Chief Complaint  Patient presents with  . Fall     (Consider location/radiation/quality/duration/timing/severity/associated sxs/prior Treatment) HPI EMS reports that the patient was transitioning from a wheelchair to a couch and slipped to floor. This apparently was not witnessed. The patient does have significant dementia. Is no report of head injury. On their arrival, they report that she was seated and leaning against a couch. Initially they identify some pain in the left shoulder however during transport this seemed to resolve. There were no specific injuries or concerns in change of baseline from the nursing home staff. Patient is not reporting any injury or pain at this time. She is alert and pleasant but does seem confused to time and place. There is no report from EMS of the patient having any recent illness or concerns for acute exacerbations of underlying medical conditions. Past Medical History  Diagnosis Date  . MELANOMA, HX OF 08/04/2007  . ALLERGIC RHINITIS 06/23/2007  . ANXIETY DEPRESSION 06/12/2009  . ANXIETY 06/23/2007  . 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 GERD 06/23/2007  . HYPERLIPIDEMIA 06/24/2007  . HYPERTENSION 06/23/2007  . COLONIC POLYPS, HX OF 03/08/2002  . DIVERTICULOSIS, COLON 08/04/2007  . Irritable bowel syndrome 06/23/2007  . Memory dysfunction 06/23/2011  . Cancer ([place])     skin cx/ hx melanoma  . LOW BACK PAIN 06/24/2007  . Alcohol abuse 02/27/2012   Past Surgical History  Procedure Laterality Date  . Cesarean section      x 2  . Colonoscopy    . Appendectomy     Family History  Problem Relation Age of Onset  . Hypertension Other   . Diabetes Other   . Colon cancer Cousin    Social History  Substance Use Topics  . Smoking status: Never Smoker   . Smokeless tobacco: Never Used  . Alcohol Use: 25.2 oz/week    42  Glasses of wine per week   OB History    No data available     Review of Systems Not obtain review of systems due to patient dementia level V caveat.   Allergies  Codeine phosphate  Home Medications   Prior to Admission medications   Medication Sig Start Date End Date Taking? Authorizing Provider  amLODipine (NORVASC) 5 MG tablet Take 1 tablet (5 mg total) by mouth daily. Patient taking differently: Take 10 mg by mouth daily.  03/17/15  Yes [name], MD  apixaban (ELIQUIS) 5 MG TABS tablet Take 5 mg by mouth 2 (two) times daily.    Yes Historical Provider, MD  chlordiazePOXIDE (LIBRIUM) 5 MG capsule Take one tablet by mouth at 8am,12noon, and 4pm for anxiety   Yes Historical Provider, MD  chlordiazePOXIDE (LIBRIUM) 5 MG capsule Take 10 mg by mouth at bedtime.   Yes Historical Provider, MD  divalproex (DEPAKOTE SPRINKLE) 125 MG capsule Take 250 mg by mouth 2 (two) times daily. Take two tablets by mouth twice daily   Yes Historical Provider, MD  ferrous sulfate 325 (65 FE) MG tablet Take 1 tablet (325 mg total) by mouth 3 (three) times daily after meals. 03/17/15  Yes [name], MD  folic acid (FOLVITE) 1 MG tablet Take 1 tablet (1 mg total) by mouth daily. 03/17/15  Yes [name], MD  hydrocortisone 2.5 % cream Apply 1 application topically 3 (  three) times daily.   Yes Historical Provider, MD  LORazepam (ATIVAN) 0.5 MG tablet Take 0.5 mg by mouth every 8 (eight) hours.   Yes Historical Provider, MD  Multiple Vitamin (MULTIVITAMIN WITH MINERALS) TABS tablet Take 1 tablet by mouth daily. 03/17/15  Yes [name], MD  QUEtiapine (SEROQUEL) 50 MG tablet Take 25-50 mg by mouth 2 (two) times daily. Take 0.5 tablet (25 mg) in the am and Take 1 tablet (50 mg) in the evening.   Yes Historical Provider, MD  risperiDONE (RISPERDAL) 1 MG tablet Take 1 mg by mouth daily.   Yes Historical Provider, MD  thiamine 100 MG tablet Take 1 tablet (100 mg total) by mouth daily.  03/17/15  Yes [name], MD  traMADol (ULTRAM) 50 MG tablet Take 50 mg by mouth every 8 (eight) hours.   Yes Historical Provider, MD   BP 159/68 mmHg  Pulse 73  Temp(Src) 97.8 F (36.6 C) (Axillary)  Resp 18  SpO2 95% Physical Exam  Constitutional:  Asian is alert and pleasant. She is showing no acute distress. She has no respiratory distress.  HENT:  Head: Normocephalic and atraumatic.  Right Ear: External ear normal.  Left Ear: External ear normal.  Nose: Nose normal.  Mouth/Throat: Oropharynx is clear and moist.  Eyes: EOM are normal. Pupils are equal, round, and reactive to light.  Neck:  C-spine tenderness to palpation. Tissue swelling. No exhibition of pain on passive range of motion.  Cardiovascular: Normal rate, regular rhythm, normal heart sounds and intact distal pulses.   Pulmonary/Chest: Effort normal and breath sounds normal.  Patient exhibits no pain to compression of the chest wall. There are no visible abrasions or contusions.  Abdominal: Soft. She exhibits no distension. There is no tenderness.  Musculoskeletal:  I'm able to have the patient grip my hands and put her through a passive range of motion at the shoulders and the elbows or wrists without eczema admission the pain. She has older contusions on the arms or did not appear to be new or skin tears or abrasions. Both lower extremities are wrapped with Coban and dressings. She reports she's had these on most of the time. I have palpated over this and she does not report pain. Her knees stay in a fairly flexed position. She reports this is normal for her. Taken her by the knees and rocked her hips back and forth laterally without any expression of pain. No pain expression to palpation over the trochanters or the iliac crests. He is cooperative with these activities.  Neurological: She is alert. She exhibits normal muscle tone. Coordination normal.  All the patient does appear to have dementia. She is alert and  pleasant. She cooperates as requested with exam. Questions with appropriate responses regarding pain and baseline function.  Skin: Skin is warm and dry.    ED Course  Procedures (including critical care time) Labs Review Labs Reviewed - No data to display  Imaging Review No results found. I have personally reviewed and evaluated these images and lab results as part of my medical decision-making.   EKG Interpretation None      MDM   Final diagnoses:  Fall, initial encounter  Dementia, without behavioral disturbance   At this time, no objective injuries are identified. There is no report for specific area of concern. At this time I feel patient is safe to return to her nursing home care for continued observation and management.    Charlesetta Shanks, MD 12/12/15  1339 

## 2015-12-12 NOTE — ED Notes (Signed)
Bed: [facility] Expected date:  Expected time:  Means of arrival:  Comments: 

## 2015-12-14 ENCOUNTER — Emergency Department (HOSPITAL_COMMUNITY)
Admission: EM | Admit: 2015-12-14 | Discharge: 2015-12-14 | Disposition: A | Discharge status: Home / Self Care | Payer: Medicare Other | Attending: Emergency Medicine | Admitting: Emergency Medicine

## 2015-12-14 ENCOUNTER — Encounter (HOSPITAL_COMMUNITY): Payer: Self-pay | Admitting: Emergency Medicine

## 2015-12-14 ENCOUNTER — Emergency Department (HOSPITAL_COMMUNITY): Payer: Medicare Other

## 2015-12-14 DIAGNOSIS — Z7901 Long term (current) use of anticoagulants: Secondary | ICD-10-CM | POA: Insufficient documentation

## 2015-12-14 DIAGNOSIS — Z7952 Long term (current) use of systemic steroids: Secondary | ICD-10-CM | POA: Insufficient documentation

## 2015-12-14 DIAGNOSIS — Y998 Other external cause status: Secondary | ICD-10-CM | POA: Insufficient documentation

## 2015-12-14 DIAGNOSIS — Z79899 Other long term (current) drug therapy: Secondary | ICD-10-CM | POA: Diagnosis not present

## 2015-12-14 DIAGNOSIS — F329 Major depressive disorder, single episode, unspecified: Secondary | ICD-10-CM | POA: Insufficient documentation

## 2015-12-14 DIAGNOSIS — I10 Essential (primary) hypertension: Secondary | ICD-10-CM | POA: Diagnosis not present

## 2015-12-14 DIAGNOSIS — Y9389 Activity, other specified: Secondary | ICD-10-CM | POA: Diagnosis not present

## 2015-12-14 DIAGNOSIS — W19XXXA Unspecified fall, initial encounter: Secondary | ICD-10-CM

## 2015-12-14 DIAGNOSIS — Y92129 Unspecified place in nursing home as the place of occurrence of the external cause: Secondary | ICD-10-CM | POA: Diagnosis not present

## 2015-12-14 DIAGNOSIS — Z8739 Personal history of other diseases of the musculoskeletal system and connective tissue: Secondary | ICD-10-CM | POA: Diagnosis not present

## 2015-12-14 DIAGNOSIS — Z8639 Personal history of other endocrine, nutritional and metabolic disease: Secondary | ICD-10-CM | POA: Diagnosis not present

## 2015-12-14 DIAGNOSIS — Z043 Encounter for examination and observation following other accident: Secondary | ICD-10-CM | POA: Insufficient documentation

## 2015-12-14 DIAGNOSIS — F0390 Unspecified dementia without behavioral disturbance: Secondary | ICD-10-CM | POA: Diagnosis not present

## 2015-12-14 DIAGNOSIS — W1839XA Other fall on same level, initial encounter: Secondary | ICD-10-CM | POA: Insufficient documentation

## 2015-12-14 DIAGNOSIS — F419 Anxiety disorder, unspecified: Secondary | ICD-10-CM | POA: Insufficient documentation

## 2015-12-14 DIAGNOSIS — Z85820 Personal history of malignant melanoma of skin: Secondary | ICD-10-CM | POA: Diagnosis not present

## 2015-12-14 DIAGNOSIS — Z86010 Personal history of colonic polyps: Secondary | ICD-10-CM | POA: Diagnosis not present

## 2015-12-14 DIAGNOSIS — Z8719 Personal history of other diseases of the digestive system: Secondary | ICD-10-CM | POA: Diagnosis not present

## 2015-12-14 NOTE — ED Notes (Signed)
Gail, NP at bedside. 

## 2015-12-14 NOTE — Discharge Instructions (Signed)
This patient received a CT of her head and neck after reported fall both normal .  She is safe to return to her nursing home   Fall Prevention in Hospitals, Adult As a hospital patient, your condition and the treatments you receive can increase your risk for falls. Some additional risk factors for falls in a hospital include:  Being in an unfamiliar environment.  Being on bed rest.  Your surgery.  Taking certain medicines.  Your tubing requirements, such as intravenous (IV) therapy or catheters. It is important that you learn how to decrease fall risks while at the hospital. Below are important tips that can help prevent falls. SAFETY TIPS FOR PREVENTING FALLS Talk about your risk of falling.  Ask your health care provider why you are at risk for falling. Is it your medicine, illness, tubing placement, or something else?  Make a plan with your health care provider to keep you safe from falls.  Ask your health care provider or pharmacist about side effects of your medicines. Some medicines can make you dizzy or affect your coordination. Ask for help.  Ask for help before getting out of bed. You may need to press your call button.  Ask for assistance in getting safely to the toilet.  Ask for a walker or cane to be put at your bedside. Ask that most of the side rails on your bed be placed up before your health care provider leaves the room.  Ask family or friends to sit with you.  Ask for things that are out of your reach, such as your glasses, hearing aids, telephone, bedside table, or call button. Follow these tips to avoid falling:  Stay lying or seated, rather than standing, while waiting for help.  Wear rubber-soled slippers or shoes whenever you walk in the hospital.  Avoid quick, sudden movements.  Change positions slowly.  Sit on the side of your bed before standing.  Stand up slowly and wait before you start to walk.  Let your health care provider know if there  is a spill on the floor.  Pay careful attention to the medical equipment, electrical cords, and tubes around you.  When you need help, use your call button by your bed or in the bathroom. Wait for one of your health care providers to help you.  If you feel dizzy or unsure of your footing, return to bed and wait for assistance.  Avoid being distracted by the TV, telephone, or another person in your room.  Do not lean or support yourself on rolling objects, such as IV poles or bedside tables.   This information is not intended to replace advice given to you by your health care provider. Make sure you discuss any questions you have with your health care provider.   Document Released: 09/10/2000 Document Revised: 10/04/2014 Document Reviewed: 05/21/2012 Elsevier Interactive Patient Education [employer].

## 2015-12-14 NOTE — ED Provider Notes (Signed)
CSN: [id number]     Arrival date & time 12/14/15  1928 History   First MD Initiated Contact with Patient 12/14/15 2002     Chief Complaint  Patient presents with  . Fall     (Consider location/radiation/quality/duration/timing/severity/associated sxs/prior Treatment) HPI Comments: Level 5 caveat applies can not give history Found on the floor of another patiens room - unwitnesses fall  Patient is a 70 y.o. female presenting with fall. The history is provided by the patient and the EMS personnel. The history is limited by the condition of the patient.  Fall This is a recurrent problem. The current episode started today. The problem occurs every several days. The problem has been unchanged. Pertinent negatives include no fever.    Past Medical History  Diagnosis Date  . MELANOMA, HX OF 08/04/2007  . ALLERGIC RHINITIS 06/23/2007  . ANXIETY DEPRESSION 06/12/2009  . ANXIETY 06/23/2007  . 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 GERD 06/23/2007  . HYPERLIPIDEMIA 06/24/2007  . HYPERTENSION 06/23/2007  . COLONIC POLYPS, HX OF 03/08/2002  . DIVERTICULOSIS, COLON 08/04/2007  . Irritable bowel syndrome 06/23/2007  . Memory dysfunction 06/23/2011  . Cancer ([place])     skin cx/ hx melanoma  . LOW BACK PAIN 06/24/2007  . Alcohol abuse 02/27/2012   Past Surgical History  Procedure Laterality Date  . Cesarean section      x 2  . Colonoscopy    . Appendectomy     Family History  Problem Relation Age of Onset  . Hypertension Other   . Diabetes Other   . Colon cancer Cousin    Social History  Substance Use Topics  . Smoking status: Never Smoker   . Smokeless tobacco: Never Used  . Alcohol Use: 25.2 oz/week    42 Glasses of wine per week   OB History    No data available     Review of Systems  Unable to perform ROS: Dementia  Constitutional: Negative for fever.  Skin: Negative for wound.  Neurological: Negative for speech difficulty.      Allergies  Codeine  phosphate  Home Medications   Prior to Admission medications   Medication Sig Start Date End Date Taking? Authorizing Provider  amLODipine (NORVASC) 5 MG tablet Take 1 tablet (5 mg total) by mouth daily. 03/17/15  Yes [name], MD  apixaban (ELIQUIS) 5 MG TABS tablet Take 5 mg by mouth 2 (two) times daily.    Yes Historical Provider, MD  divalproex (DEPAKOTE SPRINKLE) 125 MG capsule Take 250 mg by mouth 2 (two) times daily.    Yes Historical Provider, MD  ferrous sulfate 325 (65 FE) MG tablet Take 1 tablet (325 mg total) by mouth 3 (three) times daily after meals. 03/17/15  Yes [name], MD  folic acid (FOLVITE) 1 MG tablet Take 1 tablet (1 mg total) by mouth daily. 03/17/15  Yes [name], MD  hydrocortisone 2.5 % cream Apply 1 application topically 3 (three) times daily.   Yes Historical Provider, MD  LORazepam (ATIVAN) 0.5 MG tablet Take 1 mg by mouth every 8 (eight) hours.    Yes Historical Provider, MD  Multiple Vitamin (MULTIVITAMIN WITH MINERALS) TABS tablet Take 1 tablet by mouth daily. 03/17/15  Yes [name], MD  QUEtiapine (SEROQUEL) 50 MG tablet Take 25-50 mg by mouth 2 (two) times daily. Take 0.5 tablet (25 mg) in the am and Take 1 tablet (50 mg) in the evening.   Yes Historical Provider,  MD  risperiDONE (RISPERDAL) 1 MG tablet Take 1 mg by mouth daily.   Yes Historical Provider, MD  thiamine 100 MG tablet Take 1 tablet (100 mg total) by mouth daily. 03/17/15  Yes [name], MD  traMADol (ULTRAM) 50 MG tablet Take 50 mg by mouth every 8 (eight) hours.   Yes Historical Provider, MD   BP 135/73 mmHg  Pulse 85  Temp(Src) 97.7 F (36.5 C) (Oral)  Resp 15  SpO2 98% Physical Exam  Constitutional: She is oriented to person, place, and time. She appears well-developed and well-nourished.  HENT:  Head: Normocephalic and atraumatic.  Eyes: Pupils are equal, round, and reactive to light.  Neck: Normal range of motion. No spinous process tenderness  and no muscular tenderness present.  Cardiovascular: Normal rate and regular rhythm.   Pulmonary/Chest: Effort normal and breath sounds normal.  Abdominal: Soft. Bowel sounds are normal. She exhibits no distension. There is no tenderness.  Musculoskeletal: Normal range of motion.  Neurological: She is alert and oriented to person, place, and time.  Skin: Skin is warm.  Psychiatric: She has a normal mood and affect.  Nursing note and vitals reviewed.   ED Course  Procedures (including critical care time) Labs Review Labs Reviewed - No data to display  Imaging Review Ct Head Wo Contrast  12/14/2015  CLINICAL DATA:  Unwitnessed fall, fell 5 times in the past week. EXAM: CT HEAD WITHOUT CONTRAST CT CERVICAL SPINE WITHOUT CONTRAST TECHNIQUE: Multidetector CT imaging of the head and cervical spine was performed following the standard protocol without intravenous contrast. Multiplanar CT image reconstructions of the cervical spine were also generated. COMPARISON:  11/24/2015 FINDINGS: CT HEAD FINDINGS There is no intracranial hemorrhage, mass or evidence of acute infarction. There is moderate generalized atrophy. There is moderate chronic microvascular ischemic change. There is no significant extra-axial fluid collection. No interval change from 11/24/2015. No acute intracranial findings are evident. Calvarium and skullbase are intact. Visible paranasal sinuses are clear. CT CERVICAL SPINE FINDINGS The vertebral column, pedicles and facet articulations are intact. There is no evidence of acute fracture. No acute soft tissue abnormalities are evident. Moderate degenerative cervical disc disease is present from C3 through C6. Mild curvature reversal at C4. IMPRESSION: 1. Negative for acute intracranial traumatic injury. There is moderate generalized atrophy and chronic microvascular ischemic change. 2. Negative for acute cervical spine fracture. Electronically Signed   By: Andreas Newport M.D.   On:  12/14/2015 21:41   Ct Cervical Spine Wo Contrast  12/14/2015  CLINICAL DATA:  Unwitnessed fall, fell 5 times in the past week. EXAM: CT HEAD WITHOUT CONTRAST CT CERVICAL SPINE WITHOUT CONTRAST TECHNIQUE: Multidetector CT imaging of the head and cervical spine was performed following the standard protocol without intravenous contrast. Multiplanar CT image reconstructions of the cervical spine were also generated. COMPARISON:  11/24/2015 FINDINGS: CT HEAD FINDINGS There is no intracranial hemorrhage, mass or evidence of acute infarction. There is moderate generalized atrophy. There is moderate chronic microvascular ischemic change. There is no significant extra-axial fluid collection. No interval change from 11/24/2015. No acute intracranial findings are evident. Calvarium and skullbase are intact. Visible paranasal sinuses are clear. CT CERVICAL SPINE FINDINGS The vertebral column, pedicles and facet articulations are intact. There is no evidence of acute fracture. No acute soft tissue abnormalities are evident. Moderate degenerative cervical disc disease is present from C3 through C6. Mild curvature reversal at C4. IMPRESSION: 1. Negative for acute intracranial traumatic injury. There is moderate generalized atrophy and  chronic microvascular ischemic change. 2. Negative for acute cervical spine fracture. Electronically Signed   By: Andreas Newport M.D.   On: 12/14/2015 21:41   I have personally reviewed and evaluated these images and lab results as part of my medical decision-making.   EKG Interpretation None     No obvious injury will scan head and neck  CT of head, neck, reviewed all within normal limits.  Patient is to be returned to her nursing home with fall precautions MDM   Final diagnoses:  Fall, initial encounter         Junius Creamer, NP 12/14/15 2157  Fredia Sorrow, MD 12/14/15 2212

## 2015-12-14 NOTE — ED Notes (Signed)
Patient transported to CT 

## 2015-12-14 NOTE — ED Notes (Signed)
Pt arrives by [facility] post fall. Pt lives at [facility] and was found on the floor of another resident's room post unwitnessed fall. Pt has fallen 5 times in the last week according to report given to EMS. Pt is on Eliquis. Pt has dementia. No complaints of pain. Last vitals 142/84, HR 96, O2 100% on RA, RR 16. CBG 130.

## 2015-12-15 ENCOUNTER — Emergency Department (HOSPITAL_COMMUNITY)
Admission: EM | Admit: 2015-12-15 | Discharge: 2015-12-15 | Disposition: A | Discharge status: Home / Self Care | Payer: Medicare Other | Attending: Emergency Medicine | Admitting: Emergency Medicine

## 2015-12-15 ENCOUNTER — Encounter (HOSPITAL_COMMUNITY): Payer: Self-pay

## 2015-12-15 DIAGNOSIS — Z8739 Personal history of other diseases of the musculoskeletal system and connective tissue: Secondary | ICD-10-CM | POA: Diagnosis not present

## 2015-12-15 DIAGNOSIS — F418 Other specified anxiety disorders: Secondary | ICD-10-CM | POA: Insufficient documentation

## 2015-12-15 DIAGNOSIS — Z7952 Long term (current) use of systemic steroids: Secondary | ICD-10-CM | POA: Insufficient documentation

## 2015-12-15 DIAGNOSIS — Z86010 Personal history of colonic polyps: Secondary | ICD-10-CM | POA: Insufficient documentation

## 2015-12-15 DIAGNOSIS — Z8709 Personal history of other diseases of the respiratory system: Secondary | ICD-10-CM | POA: Insufficient documentation

## 2015-12-15 DIAGNOSIS — Y92129 Unspecified place in nursing home as the place of occurrence of the external cause: Secondary | ICD-10-CM | POA: Diagnosis not present

## 2015-12-15 DIAGNOSIS — Z043 Encounter for examination and observation following other accident: Secondary | ICD-10-CM | POA: Insufficient documentation

## 2015-12-15 DIAGNOSIS — Z7901 Long term (current) use of anticoagulants: Secondary | ICD-10-CM | POA: Diagnosis not present

## 2015-12-15 DIAGNOSIS — Y999 Unspecified external cause status: Secondary | ICD-10-CM | POA: Insufficient documentation

## 2015-12-15 DIAGNOSIS — Z8719 Personal history of other diseases of the digestive system: Secondary | ICD-10-CM | POA: Insufficient documentation

## 2015-12-15 DIAGNOSIS — Z8639 Personal history of other endocrine, nutritional and metabolic disease: Secondary | ICD-10-CM | POA: Diagnosis not present

## 2015-12-15 DIAGNOSIS — Z85820 Personal history of malignant melanoma of skin: Secondary | ICD-10-CM | POA: Diagnosis not present

## 2015-12-15 DIAGNOSIS — W19XXXA Unspecified fall, initial encounter: Secondary | ICD-10-CM | POA: Insufficient documentation

## 2015-12-15 DIAGNOSIS — I10 Essential (primary) hypertension: Secondary | ICD-10-CM | POA: Insufficient documentation

## 2015-12-15 DIAGNOSIS — Y9389 Activity, other specified: Secondary | ICD-10-CM | POA: Diagnosis not present

## 2015-12-15 DIAGNOSIS — Z79899 Other long term (current) drug therapy: Secondary | ICD-10-CM | POA: Diagnosis not present

## 2015-12-15 NOTE — ED Notes (Signed)
Per EMS, pt from [facility], nursing staff found pt on the floor, pt has small knot on the back of her head. Pt denies head injury but has dementia. Pt is on blood thinners. Pt told nursing staff after incident that her left elbow hurt, but denies pain with EMS. Pt at her baseline per nursing staff. VSS. BP 138/72, HR 84, 99% RA, 16 RR

## 2015-12-15 NOTE — Discharge Instructions (Signed)

## 2015-12-15 NOTE — ED Notes (Signed)
Per Christy Gentles, contacted [facility] in order to obtain a better history of pt's baseline and falls. Pt seen last night for same complaint and ultimately discharged. Staff member I spoke with at [facility] stated that pt had an unwitnessed fall at 0200 today. Staff found her seated on the floor. Staff says pt told them she hit her head. No complaint of fall or striking her head during present presentation in this ED. No injury noted. Pt at baseline for severe dementia with inability to accurately recall current settings or past events. Baseline confirmed by [facility] staff.

## 2015-12-15 NOTE — ED Provider Notes (Signed)
CSN: [id number]     Arrival date & time 12/15/15  0330 History   First MD Initiated Contact with Patient 12/15/15 [phone]     Chief Complaint  Patient presents with  . Fall  . Head Injury   LEVEL 5 CAVEAT DUE TO ALTERED MENTAL STATUS  HPI  Patient presents from [facility] for evaluation of possible falls She was found sitting on floor after nursing staff heard what sounded like a fall She was sent for evaluation Pt reports she fell 3 days ago She initially reported left elbow pain but none at this time She denies HA She has no other complaints Pt is on eliquis Her course is stable, nothing worsens her symptoms   Past Medical History  Diagnosis Date  . MELANOMA, HX OF 08/04/2007  . ALLERGIC RHINITIS 06/23/2007  . ANXIETY DEPRESSION 06/12/2009  . ANXIETY 06/23/2007  . 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 GERD 06/23/2007  . HYPERLIPIDEMIA 06/24/2007  . HYPERTENSION 06/23/2007  . COLONIC POLYPS, HX OF 03/08/2002  . DIVERTICULOSIS, COLON 08/04/2007  . Irritable bowel syndrome 06/23/2007  . Memory dysfunction 06/23/2011  . Cancer ([place])     skin cx/ hx melanoma  . LOW BACK PAIN 06/24/2007  . Alcohol abuse 02/27/2012   Past Surgical History  Procedure Laterality Date  . Cesarean section      x 2  . Colonoscopy    . Appendectomy     Family History  Problem Relation Age of Onset  . Hypertension Other   . Diabetes Other   . Colon cancer Cousin    Social History  Substance Use Topics  . Smoking status: Never Smoker   . Smokeless tobacco: Never Used  . Alcohol Use: 25.2 oz/week    42 Glasses of wine per week   OB History    No data available     Review of Systems  Unable to perform ROS: Mental status change      Allergies  Codeine phosphate  Home Medications   Prior to Admission medications   Medication Sig Start Date End Date Taking? Authorizing Provider  amLODipine (NORVASC) 5 MG tablet Take 1 tablet (5 mg total) by mouth daily. 03/17/15   Shanker  Kristeen Mans, MD  apixaban (ELIQUIS) 5 MG TABS tablet Take 5 mg by mouth 2 (two) times daily.     Historical Provider, MD  divalproex (DEPAKOTE SPRINKLE) 125 MG capsule Take 250 mg by mouth 2 (two) times daily.     Historical Provider, MD  ferrous sulfate 325 (65 FE) MG tablet Take 1 tablet (325 mg total) by mouth 3 (three) times daily after meals. 03/17/15   [name], MD  folic acid (FOLVITE) 1 MG tablet Take 1 tablet (1 mg total) by mouth daily. 03/17/15   [name], MD  hydrocortisone 2.5 % cream Apply 1 application topically 3 (three) times daily.    Historical Provider, MD  LORazepam (ATIVAN) 0.5 MG tablet Take 1 mg by mouth every 8 (eight) hours.     Historical Provider, MD  Multiple Vitamin (MULTIVITAMIN WITH MINERALS) TABS tablet Take 1 tablet by mouth daily. 03/17/15   [name], MD  QUEtiapine (SEROQUEL) 50 MG tablet Take 25-50 mg by mouth 2 (two) times daily. Take 0.5 tablet (25 mg) in the am and Take 1 tablet (50 mg) in the evening.    Historical Provider, MD  risperiDONE (RISPERDAL) 1 MG tablet Take 1 mg by mouth daily.    Historical Provider,  MD  thiamine 100 MG tablet Take 1 tablet (100 mg total) by mouth daily. 03/17/15   [name], MD  traMADol (ULTRAM) 50 MG tablet Take 50 mg by mouth every 8 (eight) hours.    Historical Provider, MD   BP 132/86 mmHg  Pulse 77  Temp(Src) 98.1 F (36.7 C) (Oral)  Resp 15  Ht 5\' 5"  (1.651 m)  Wt 54.432 kg  BMI 19.97 kg/m2  SpO2 97% Physical Exam CONSTITUTIONAL: elderly, frail HEAD: Normocephalic/atraumatic EYES: EOMI ENMT: Mucous membranes moist, no signs of trauma NECK: supple no meningeal signs SPINE/BACK:entire spine nontender CV: S1/S2 noted LUNGS: Lungs are clear to auscultation bilaterally ABDOMEN: soft, nontender NEURO: Pt is awake/alert, pleasantly confused.  Maex4.  No distress noted EXTREMITIES: pulses normal/equal, full ROM, scattered abrasions to extremities.  No focal tenderness.  No deformities.   LE are wrapped symmetrically.  No tenderness noted SKIN: warm, color normal   ED Course  Procedures  5:46 AM Pt seen on 3/19 in the evening for a fall and had CT imaging that was negative She went back to [facility] and then was sent back for another evaluation I don't see any signs of new trauma She is awake/alert Staff at facility confirm she is at baseline mental status I will not order further testing at this time She needs to have use of eliquis evaluated EPIC note sent to PCP for evaluation  Imaging Review Ct Head Wo Contrast  12/14/2015  CLINICAL DATA:  Unwitnessed fall, fell 5 times in the past week. EXAM: CT HEAD WITHOUT CONTRAST CT CERVICAL SPINE WITHOUT CONTRAST TECHNIQUE: Multidetector CT imaging of the head and cervical spine was performed following the standard protocol without intravenous contrast. Multiplanar CT image reconstructions of the cervical spine were also generated. COMPARISON:  11/24/2015 FINDINGS: CT HEAD FINDINGS There is no intracranial hemorrhage, mass or evidence of acute infarction. There is moderate generalized atrophy. There is moderate chronic microvascular ischemic change. There is no significant extra-axial fluid collection. No interval change from 11/24/2015. No acute intracranial findings are evident. Calvarium and skullbase are intact. Visible paranasal sinuses are clear. CT CERVICAL SPINE FINDINGS The vertebral column, pedicles and facet articulations are intact. There is no evidence of acute fracture. No acute soft tissue abnormalities are evident. Moderate degenerative cervical disc disease is present from C3 through C6. Mild curvature reversal at C4. IMPRESSION: 1. Negative for acute intracranial traumatic injury. There is moderate generalized atrophy and chronic microvascular ischemic change. 2. Negative for acute cervical spine fracture. Electronically Signed   By: Andreas Newport M.D.   On: 12/14/2015 21:41   Ct Cervical Spine Wo  Contrast  12/14/2015  CLINICAL DATA:  Unwitnessed fall, fell 5 times in the past week. EXAM: CT HEAD WITHOUT CONTRAST CT CERVICAL SPINE WITHOUT CONTRAST TECHNIQUE: Multidetector CT imaging of the head and cervical spine was performed following the standard protocol without intravenous contrast. Multiplanar CT image reconstructions of the cervical spine were also generated. COMPARISON:  11/24/2015 FINDINGS: CT HEAD FINDINGS There is no intracranial hemorrhage, mass or evidence of acute infarction. There is moderate generalized atrophy. There is moderate chronic microvascular ischemic change. There is no significant extra-axial fluid collection. No interval change from 11/24/2015. No acute intracranial findings are evident. Calvarium and skullbase are intact. Visible paranasal sinuses are clear. CT CERVICAL SPINE FINDINGS The vertebral column, pedicles and facet articulations are intact. There is no evidence of acute fracture. No acute soft tissue abnormalities are evident. Moderate degenerative cervical disc disease is present  from C3 through C6. Mild curvature reversal at C4. IMPRESSION: 1. Negative for acute intracranial traumatic injury. There is moderate generalized atrophy and chronic microvascular ischemic change. 2. Negative for acute cervical spine fracture. Electronically Signed   By: Andreas Newport M.D.   On: 12/14/2015 21:41     MDM   Final diagnoses:  Fall, initial encounter    Nursing notes including past medical history and social history reviewed and considered in documentation     Ripley Fraise, MD 12/15/15 [phone]

## 2015-12-16 ENCOUNTER — Telehealth: Payer: Self-pay | Admitting: Internal Medicine

## 2015-12-16 NOTE — Telephone Encounter (Signed)
Dr. Jenny Reichmann received a note from ER doctor regarding her frequent ER visits due to falls. Dr. Jenny Reichmann needs pt to come in for hospital f/u. I left a msg on "Brad's" vm to call back.

## 2015-12-21 ENCOUNTER — Emergency Department (HOSPITAL_COMMUNITY)
Admission: EM | Admit: 2015-12-21 | Discharge: 2015-12-21 | Disposition: A | Discharge status: Home / Self Care | Payer: Medicare Other | Attending: Emergency Medicine | Admitting: Emergency Medicine

## 2015-12-21 ENCOUNTER — Encounter (HOSPITAL_COMMUNITY): Payer: Self-pay

## 2015-12-21 DIAGNOSIS — S60022A Contusion of left index finger without damage to nail, initial encounter: Secondary | ICD-10-CM | POA: Insufficient documentation

## 2015-12-21 DIAGNOSIS — F418 Other specified anxiety disorders: Secondary | ICD-10-CM | POA: Insufficient documentation

## 2015-12-21 DIAGNOSIS — S61211A Laceration without foreign body of left index finger without damage to nail, initial encounter: Secondary | ICD-10-CM | POA: Insufficient documentation

## 2015-12-21 DIAGNOSIS — Z86010 Personal history of colonic polyps: Secondary | ICD-10-CM | POA: Insufficient documentation

## 2015-12-21 DIAGNOSIS — Y92128 Other place in nursing home as the place of occurrence of the external cause: Secondary | ICD-10-CM | POA: Insufficient documentation

## 2015-12-21 DIAGNOSIS — F0390 Unspecified dementia without behavioral disturbance: Secondary | ICD-10-CM | POA: Insufficient documentation

## 2015-12-21 DIAGNOSIS — S61412A Laceration without foreign body of left hand, initial encounter: Secondary | ICD-10-CM

## 2015-12-21 DIAGNOSIS — W19XXXA Unspecified fall, initial encounter: Secondary | ICD-10-CM

## 2015-12-21 DIAGNOSIS — Z85828 Personal history of other malignant neoplasm of skin: Secondary | ICD-10-CM | POA: Insufficient documentation

## 2015-12-21 DIAGNOSIS — W1839XA Other fall on same level, initial encounter: Secondary | ICD-10-CM | POA: Insufficient documentation

## 2015-12-21 DIAGNOSIS — Y9389 Activity, other specified: Secondary | ICD-10-CM | POA: Insufficient documentation

## 2015-12-21 DIAGNOSIS — Y92129 Unspecified place in nursing home as the place of occurrence of the external cause: Secondary | ICD-10-CM

## 2015-12-21 DIAGNOSIS — I10 Essential (primary) hypertension: Secondary | ICD-10-CM | POA: Insufficient documentation

## 2015-12-21 DIAGNOSIS — Y998 Other external cause status: Secondary | ICD-10-CM | POA: Insufficient documentation

## 2015-12-21 NOTE — ED Provider Notes (Signed)
CSN: [id number]     Arrival date & time 12/21/15  [id number] History  By signing my name below, I, Irene Pap, attest that this documentation has been prepared under the direction and in the presence of Rolland Porter, MD at [facility]. Electronically Signed: Irene Pap, ED Scribe. 12/21/2015. 3:29 AM.   Chief Complaint  Patient presents with  . Fall   The history is provided by the patient. No language interpreter was used.   HPI Comments (Level 5 Caveat due to dementia):  Tara Shaffer is a 70 y.o. female with a hx of melanoma, memory dysfunction, DDD, HTN, IBS, diverticulitis, and alcohol abuse brought in by EMS from [facility] who presents to the Emergency Department complaining of a fall.  Pt states that she had a fall a few days ago. She denies hitting her head in the fall or LOC. She denies experiencing any pain currently. Per triage note, the pt had an unwitnessed fall. No LOC was noted by staff and is only complaining of a skin tear between her left index and middle fingers. She does take any blood thinners.  PCP Dr Jenny Reichmann  Past Medical History  Diagnosis Date  . MELANOMA, HX OF 08/04/2007  . ALLERGIC RHINITIS 06/23/2007  . ANXIETY DEPRESSION 06/12/2009  . ANXIETY 06/23/2007  . 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 GERD 06/23/2007  . HYPERLIPIDEMIA 06/24/2007  . HYPERTENSION 06/23/2007  . COLONIC POLYPS, HX OF 03/08/2002  . DIVERTICULOSIS, COLON 08/04/2007  . Irritable bowel syndrome 06/23/2007  . Memory dysfunction 06/23/2011  . Cancer ([place])     skin cx/ hx melanoma  . LOW BACK PAIN 06/24/2007  . Alcohol abuse 02/27/2012   Past Surgical History  Procedure Laterality Date  . Cesarean section      x 2  . Colonoscopy    . Appendectomy     Family History  Problem Relation Age of Onset  . Hypertension Other   . Diabetes Other   . Colon cancer Cousin    Social History  Substance Use Topics  . Smoking status: Never Smoker   . Smokeless tobacco: Never Used  .  Alcohol Use: 25.2 oz/week    42 Glasses of wine per week   Lives in NH  OB History    No data available     Review of Systems  Unable to perform ROS: Dementia   Allergies  Codeine phosphate  Home Medications   Prior to Admission medications   Medication Sig Start Date End Date Taking? Authorizing Provider  amLODipine (NORVASC) 5 MG tablet Take 1 tablet (5 mg total) by mouth daily. 03/17/15  Yes [name], MD  apixaban (ELIQUIS) 5 MG TABS tablet Take 5 mg by mouth 2 (two) times daily.    Yes Historical Provider, MD  divalproex (DEPAKOTE SPRINKLE) 125 MG capsule Take 250 mg by mouth 3 (three) times daily.    Yes Historical Provider, MD  escitalopram (LEXAPRO) 5 MG tablet Take 5 mg by mouth daily.   Yes Historical Provider, MD  ferrous sulfate 325 (65 FE) MG tablet Take 1 tablet (325 mg total) by mouth 3 (three) times daily after meals. 03/17/15  Yes [name], MD  folic acid (FOLVITE) 1 MG tablet Take 1 tablet (1 mg total) by mouth daily. 03/17/15  Yes [name], MD  LORazepam (ATIVAN) 0.5 MG tablet Take 1 mg by mouth every 8 (eight) hours.   Yes Historical Provider, MD  Multiple Vitamin (MULTIVITAMIN WITH MINERALS)  TABS tablet Take 1 tablet by mouth daily. 03/17/15  Yes [name], MD  QUEtiapine (SEROQUEL) 50 MG tablet Take 25-50 mg by mouth 2 (two) times daily. Take 0.5 tablet (25 mg) in the am and Take 1 tablet (50 mg) in the evening.   Yes Historical Provider, MD  rivastigmine (EXELON) 1.5 MG capsule Take 1.5 mg by mouth 2 (two) times daily.   Yes Historical Provider, MD  thiamine 100 MG tablet Take 1 tablet (100 mg total) by mouth daily. 03/17/15  Yes [name], MD  traMADol (ULTRAM) 50 MG tablet Take 50 mg by mouth every 8 (eight) hours.   Yes Historical Provider, MD   BP 139/79 mmHg  Pulse 76  Temp(Src) 98.7 F (37.1 C)  Resp 16  SpO2 97%  Vital signs normal   Physical Exam  Constitutional: She appears well-developed and  well-nourished.  Non-toxic appearance. She does not appear ill. No distress.  HENT:  Head: Normocephalic and atraumatic.  Right Ear: External ear normal.  Left Ear: External ear normal.  Nose: Nose normal. No mucosal edema or rhinorrhea.  Mouth/Throat: Oropharynx is clear and moist and mucous membranes are normal. No dental abscesses or uvula swelling.  Eyes: Conjunctivae and EOM are normal. Pupils are equal, round, and reactive to light.  Neck: Normal range of motion and full passive range of motion without pain. Neck supple.  Cardiovascular: Normal rate, regular rhythm and normal heart sounds.  Exam reveals no gallop and no friction rub.   No murmur heard. Pulmonary/Chest: Effort normal and breath sounds normal. No respiratory distress. She has no wheezes. She has no rhonchi. She has no rales. She exhibits no tenderness and no crepitus.  Abdominal: Soft. Normal appearance and bowel sounds are normal. She exhibits no distension. There is no tenderness. There is no rebound and no guarding.  Musculoskeletal: Normal range of motion. She exhibits no edema or tenderness.  1/2 cm flap skin tear over the MCP of the left index finger with bruising over the proximal phalanx of same finger; 1/2 cm flap superficial skin tear in the webspace between left index and middle fingers; Moves all extremities well. Has changes consistent with arthritis  Neurological: She is alert. She has normal strength. No cranial nerve deficit.  confused  Skin: Skin is warm, dry and intact. No rash noted. No erythema. No pallor.  Psychiatric: She has a normal mood and affect. Her speech is normal and behavior is normal. Her mood appears not anxious.  Nursing note and vitals reviewed.     ED Course  Procedures (including critical care time) DIAGNOSTIC STUDIES: Oxygen Saturation is 97% on RA, normal by my interpretation.    COORDINATION OF CARE: 3:28 AM- will bandage wounds and discharge back to nursing  facility    MDM   Final diagnoses:  Fall at nursing home, initial encounter  Skin tear of hand without complication, left, initial encounter    Plan discharge  Rolland Porter, MD, FACEP   I personally performed the services described in this documentation, which was scribed in my presence. The recorded information has been reviewed and considered.  Rolland Porter, MD, Barbette Or, MD 12/21/15 [phone]

## 2015-12-21 NOTE — ED Notes (Signed)
Patient d/c'd and transported by [facility] to [facility].  D/C instructions given to [facility].  Report called to [facility].

## 2015-12-21 NOTE — Discharge Instructions (Signed)
Keep the skin tears clean and dry. Recheck if they appear to get infected.  Recheck if she has any problems listed on the head injury sheet.    Skin Tear Care A skin tear is when the top layer of skin peels off. To repair the skin, your doctor may use:   Tape.  Skin adhesive strips. HOME CARE  Change bandages (dressings) once a day or as told by your doctor.  Gently clean the area with salt (saline) solution or with a mild soap and water.  Do not rub the injured skin dry. Let the area air dry.  Put petroleum jelly or antibiotic cream on the tear. Do not allow a scab to form.  If the bandage sticks, moisten it with warm soapy water and remove it.  Protect the injured skin until it has healed.  Only take medicine as told by your doctor.  Take showers or baths using warm soapy water. Apply a new bandage after the shower or bath.  Keep all doctor visits as told. GET HELP RIGHT AWAY IF:   You have redness, puffiness (swelling), or more pain in the tear.  You have ayellowish-white fluid (pus) coming from the tear.  You have chills.  You have a red streak that goes away from the tear.  You have a bad smell coming from the tear or bandage.  You have a fever or lasting symptoms for more than 2-3 days.  You have a fever and your symptoms suddenly get worse. MAKE SURE YOU:   Understand these instructions.  Will watch this condition.  Will get help right away if you are not doing well or get worse.   This information is not intended to replace advice given to you by your health care provider. Make sure you discuss any questions you have with your health care provider.   Document Released: 06/22/2008 Document Revised: 06/07/2012 Document Reviewed: 03/27/2012 Elsevier Interactive Patient Education [employer].

## 2015-12-21 NOTE — ED Notes (Signed)
[facility] called for patient transport 

## 2015-12-21 NOTE — ED Notes (Signed)
Bed: [id number] Expected date:  Expected time:  Means of arrival:  Comments: Ems fall

## 2015-12-21 NOTE — ED Notes (Signed)
Per EMS patient from [facility].  Unwitnessed fall from her bed.  Staff reports no known LOC, patient does not take blood thinners.  Patient only C/O skin tear between index and middle finger.

## 2015-12-21 NOTE — ED Notes (Signed)
Patient continues to try to get out of the bed, placed bed alarm under patient.

## 2015-12-23 ENCOUNTER — Inpatient Hospital Stay (HOSPITAL_COMMUNITY)
Admission: EM | Admit: 2015-12-23 | Discharge: 2015-12-26 | DRG: 689 | Disposition: A | Discharge status: Skilled Nursing Facility | Payer: Medicare Other | Attending: Family Medicine | Admitting: Family Medicine

## 2015-12-23 ENCOUNTER — Encounter (HOSPITAL_COMMUNITY): Payer: Self-pay | Admitting: Emergency Medicine

## 2015-12-23 ENCOUNTER — Emergency Department (HOSPITAL_COMMUNITY): Payer: Medicare Other

## 2015-12-23 ENCOUNTER — Observation Stay (HOSPITAL_BASED_OUTPATIENT_CLINIC_OR_DEPARTMENT_OTHER): Payer: Medicare Other

## 2015-12-23 DIAGNOSIS — D638 Anemia in other chronic diseases classified elsewhere: Secondary | ICD-10-CM

## 2015-12-23 DIAGNOSIS — R600 Localized edema: Secondary | ICD-10-CM | POA: Diagnosis not present

## 2015-12-23 DIAGNOSIS — N12 Tubulo-interstitial nephritis, not specified as acute or chronic: Secondary | ICD-10-CM | POA: Diagnosis present

## 2015-12-23 DIAGNOSIS — E876 Hypokalemia: Secondary | ICD-10-CM | POA: Diagnosis present

## 2015-12-23 DIAGNOSIS — D631 Anemia in chronic kidney disease: Secondary | ICD-10-CM | POA: Diagnosis present

## 2015-12-23 DIAGNOSIS — I82511 Chronic embolism and thrombosis of right femoral vein: Secondary | ICD-10-CM | POA: Diagnosis present

## 2015-12-23 DIAGNOSIS — N390 Urinary tract infection, site not specified: Secondary | ICD-10-CM | POA: Diagnosis present

## 2015-12-23 DIAGNOSIS — Z7901 Long term (current) use of anticoagulants: Secondary | ICD-10-CM

## 2015-12-23 DIAGNOSIS — N179 Acute kidney failure, unspecified: Secondary | ICD-10-CM | POA: Diagnosis not present

## 2015-12-23 DIAGNOSIS — F03918 Unspecified dementia, unspecified severity, with other behavioral disturbance: Secondary | ICD-10-CM | POA: Diagnosis present

## 2015-12-23 DIAGNOSIS — E440 Moderate protein-calorie malnutrition: Secondary | ICD-10-CM | POA: Diagnosis present

## 2015-12-23 DIAGNOSIS — N182 Chronic kidney disease, stage 2 (mild): Secondary | ICD-10-CM | POA: Diagnosis present

## 2015-12-23 DIAGNOSIS — X58XXXA Exposure to other specified factors, initial encounter: Secondary | ICD-10-CM | POA: Diagnosis present

## 2015-12-23 DIAGNOSIS — R6510 Systemic inflammatory response syndrome (SIRS) of non-infectious origin without acute organ dysfunction: Secondary | ICD-10-CM | POA: Diagnosis present

## 2015-12-23 DIAGNOSIS — G9340 Encephalopathy, unspecified: Secondary | ICD-10-CM | POA: Diagnosis present

## 2015-12-23 DIAGNOSIS — S72001A Fracture of unspecified part of neck of right femur, initial encounter for closed fracture: Secondary | ICD-10-CM | POA: Diagnosis present

## 2015-12-23 DIAGNOSIS — N189 Chronic kidney disease, unspecified: Secondary | ICD-10-CM

## 2015-12-23 DIAGNOSIS — N10 Acute tubulo-interstitial nephritis: Secondary | ICD-10-CM

## 2015-12-23 DIAGNOSIS — F419 Anxiety disorder, unspecified: Secondary | ICD-10-CM | POA: Diagnosis present

## 2015-12-23 DIAGNOSIS — E785 Hyperlipidemia, unspecified: Secondary | ICD-10-CM | POA: Diagnosis present

## 2015-12-23 DIAGNOSIS — E860 Dehydration: Secondary | ICD-10-CM | POA: Diagnosis present

## 2015-12-23 DIAGNOSIS — F0391 Unspecified dementia with behavioral disturbance: Secondary | ICD-10-CM | POA: Diagnosis present

## 2015-12-23 DIAGNOSIS — B965 Pseudomonas (aeruginosa) (mallei) (pseudomallei) as the cause of diseases classified elsewhere: Secondary | ICD-10-CM | POA: Diagnosis present

## 2015-12-23 DIAGNOSIS — F39 Unspecified mood [affective] disorder: Secondary | ICD-10-CM | POA: Diagnosis present

## 2015-12-23 DIAGNOSIS — Z993 Dependence on wheelchair: Secondary | ICD-10-CM

## 2015-12-23 DIAGNOSIS — A419 Sepsis, unspecified organism: Secondary | ICD-10-CM

## 2015-12-23 DIAGNOSIS — R296 Repeated falls: Secondary | ICD-10-CM

## 2015-12-23 DIAGNOSIS — M7989 Other specified soft tissue disorders: Secondary | ICD-10-CM | POA: Diagnosis present

## 2015-12-23 DIAGNOSIS — E869 Volume depletion, unspecified: Secondary | ICD-10-CM | POA: Diagnosis present

## 2015-12-23 DIAGNOSIS — Z6822 Body mass index (BMI) 22.0-22.9, adult: Secondary | ICD-10-CM

## 2015-12-23 DIAGNOSIS — I129 Hypertensive chronic kidney disease with stage 1 through stage 4 chronic kidney disease, or unspecified chronic kidney disease: Secondary | ICD-10-CM | POA: Diagnosis present

## 2015-12-23 DIAGNOSIS — I82519 Chronic embolism and thrombosis of unspecified femoral vein: Secondary | ICD-10-CM | POA: Diagnosis present

## 2015-12-23 LAB — COMPREHENSIVE METABOLIC PANEL
ALBUMIN: 2.9 g/dL — AB (ref 3.5–5.0)
ALT: 10 U/L — ABNORMAL LOW (ref 14–54)
ANION GAP: 13 (ref 5–15)
AST: 19 U/L (ref 15–41)
Alkaline Phosphatase: 79 U/L (ref 38–126)
BILIRUBIN TOTAL: 0.8 mg/dL (ref 0.3–1.2)
BUN: 27 mg/dL — AB (ref 6–20)
CHLORIDE: 107 mmol/L (ref 101–111)
CO2: 21 mmol/L — ABNORMAL LOW (ref 22–32)
Calcium: 8.6 mg/dL — ABNORMAL LOW (ref 8.9–10.3)
Creatinine, Ser: 1.72 mg/dL — ABNORMAL HIGH (ref 0.44–1.00)
GFR calc Af Amer: 34 mL/min — ABNORMAL LOW (ref >60)
GFR calc non Af Amer: 29 mL/min — ABNORMAL LOW (ref >60)
GLUCOSE: 90 mg/dL (ref 65–99)
POTASSIUM: 3.9 mmol/L (ref 3.5–5.1)
SODIUM: 141 mmol/L (ref 135–145)
TOTAL PROTEIN: 7 g/dL (ref 6.5–8.1)

## 2015-12-23 LAB — CBC WITH DIFFERENTIAL/PLATELET
Basophils Absolute: 0 10*3/uL (ref 0.0–0.1)
Basophils Relative: 0 %
EOS ABS: 0 10*3/uL (ref 0.0–0.7)
Eosinophils Relative: 0 %
HEMATOCRIT: 32.8 % — AB (ref 36.0–46.0)
HEMOGLOBIN: 10.2 g/dL — AB (ref 12.0–15.0)
LYMPHS ABS: 0.9 10*3/uL (ref 0.7–4.0)
LYMPHS PCT: 9 %
MCH: 27.6 pg (ref 26.0–34.0)
MCHC: 31.1 g/dL (ref 30.0–36.0)
MCV: 88.6 fL (ref 78.0–100.0)
MONOS PCT: 15 %
Monocytes Absolute: 1.5 10*3/uL — ABNORMAL HIGH (ref 0.1–1.0)
NEUTROS ABS: 7.3 10*3/uL (ref 1.7–7.7)
NEUTROS PCT: 76 %
Platelets: 263 10*3/uL (ref 150–400)
RBC: 3.7 MIL/uL — AB (ref 3.87–5.11)
RDW: 14.8 % (ref 11.5–15.5)
WBC: 9.7 10*3/uL (ref 4.0–10.5)

## 2015-12-23 LAB — C DIFFICILE QUICK SCREEN W PCR REFLEX
C Diff antigen: NEGATIVE
C Diff interpretation: NEGATIVE
C Diff toxin: NEGATIVE

## 2015-12-23 LAB — URINALYSIS, ROUTINE W REFLEX MICROSCOPIC
Bilirubin Urine: NEGATIVE
GLUCOSE, UA: NEGATIVE mg/dL
Ketones, ur: 15 mg/dL — AB
Nitrite: NEGATIVE
Protein, ur: 100 mg/dL — AB
SPECIFIC GRAVITY, URINE: 1.013 (ref 1.005–1.030)
pH: 6 (ref 5.0–8.0)

## 2015-12-23 LAB — URINE MICROSCOPIC-ADD ON

## 2015-12-23 LAB — OCCULT BLOOD, POC DEVICE: Fecal Occult Bld: POSITIVE — AB

## 2015-12-23 LAB — INFLUENZA PANEL BY PCR (TYPE A & B)
H1N1 flu by pcr: NOT DETECTED
INFLAPCR: NEGATIVE
Influenza B By PCR: NEGATIVE

## 2015-12-23 LAB — MRSA PCR SCREENING: MRSA BY PCR: NEGATIVE

## 2015-12-23 LAB — I-STAT CG4 LACTIC ACID, ED: Lactic Acid, Venous: 1.55 mmol/L (ref 0.5–2.0)

## 2015-12-23 MED ORDER — PIPERACILLIN-TAZOBACTAM 3.375 G IVPB 30 MIN
3.3750 g | Freq: Once | INTRAVENOUS | Status: AC
  Administered 2015-12-23: 3.375 g via INTRAVENOUS
  Filled 2015-12-23: qty 50

## 2015-12-23 MED ORDER — VANCOMYCIN HCL IN DEXTROSE 1-5 GM/200ML-% IV SOLN
1000.0000 mg | Freq: Once | INTRAVENOUS | Status: AC
  Administered 2015-12-23: 1000 mg via INTRAVENOUS
  Filled 2015-12-23: qty 200

## 2015-12-23 MED ORDER — DIVALPROEX SODIUM 125 MG PO CSDR
250.0000 mg | DELAYED_RELEASE_CAPSULE | Freq: Three times a day (TID) | ORAL | Status: DC
  Administered 2015-12-23 – 2015-12-26 (×9): 250 mg via ORAL
  Filled 2015-12-23 (×14): qty 2

## 2015-12-23 MED ORDER — ESCITALOPRAM OXALATE 10 MG PO TABS
5.0000 mg | ORAL_TABLET | Freq: Every day | ORAL | Status: DC
  Administered 2015-12-23 – 2015-12-26 (×4): 5 mg via ORAL
  Filled 2015-12-23 (×4): qty 1

## 2015-12-23 MED ORDER — BOOST / RESOURCE BREEZE PO LIQD
1.0000 | Freq: Three times a day (TID) | ORAL | Status: DC
  Administered 2015-12-24 – 2015-12-25 (×4): 1 via ORAL

## 2015-12-23 MED ORDER — APIXABAN 2.5 MG PO TABS
2.5000 mg | ORAL_TABLET | Freq: Two times a day (BID) | ORAL | Status: DC
  Administered 2015-12-23 – 2015-12-25 (×4): 2.5 mg via ORAL
  Filled 2015-12-23 (×5): qty 1

## 2015-12-23 MED ORDER — SODIUM CHLORIDE 0.9 % IV BOLUS (SEPSIS)
2000.0000 mL | Freq: Once | INTRAVENOUS | Status: AC
  Administered 2015-12-23: 2000 mL via INTRAVENOUS

## 2015-12-23 MED ORDER — VITAMIN B-1 100 MG PO TABS
100.0000 mg | ORAL_TABLET | Freq: Every day | ORAL | Status: DC
  Administered 2015-12-23 – 2015-12-26 (×4): 100 mg via ORAL
  Filled 2015-12-23 (×4): qty 1

## 2015-12-23 MED ORDER — LORAZEPAM 1 MG PO TABS
1.0000 mg | ORAL_TABLET | Freq: Three times a day (TID) | ORAL | Status: DC
  Administered 2015-12-23 – 2015-12-25 (×7): 1 mg via ORAL
  Filled 2015-12-23 (×8): qty 1

## 2015-12-23 MED ORDER — SODIUM CHLORIDE 0.9 % IV SOLN
INTRAVENOUS | Status: DC
  Administered 2015-12-23: 10:00:00 via INTRAVENOUS

## 2015-12-23 MED ORDER — SODIUM CHLORIDE 0.9% FLUSH: 3.0000 mL | Freq: Two times a day (BID) | INTRAVENOUS | Status: DC

## 2015-12-23 MED ORDER — TRAMADOL HCL 50 MG PO TABS
50.0000 mg | ORAL_TABLET | Freq: Three times a day (TID) | ORAL | Status: DC
  Administered 2015-12-23 – 2015-12-25 (×7): 50 mg via ORAL
  Filled 2015-12-23 (×8): qty 1

## 2015-12-23 MED ORDER — ACETAMINOPHEN 650 MG RE SUPP: 650.0000 mg | Freq: Four times a day (QID) | RECTAL | Status: DC | PRN

## 2015-12-23 MED ORDER — FOLIC ACID 1 MG PO TABS
1.0000 mg | ORAL_TABLET | Freq: Every day | ORAL | Status: DC
  Administered 2015-12-23 – 2015-12-26 (×4): 1 mg via ORAL
  Filled 2015-12-23 (×4): qty 1

## 2015-12-23 MED ORDER — ADULT MULTIVITAMIN W/MINERALS CH
1.0000 | ORAL_TABLET | Freq: Every day | ORAL | Status: DC
  Administered 2015-12-23 – 2015-12-26 (×4): 1 via ORAL
  Filled 2015-12-23 (×4): qty 1

## 2015-12-23 MED ORDER — DEXTROSE 5 % IV SOLN
1.0000 g | INTRAVENOUS | Status: DC
  Administered 2015-12-24: 1 g via INTRAVENOUS
  Filled 2015-12-23 (×3): qty 10

## 2015-12-23 MED ORDER — FERROUS SULFATE 325 (65 FE) MG PO TABS
325.0000 mg | ORAL_TABLET | Freq: Three times a day (TID) | ORAL | Status: DC
  Administered 2015-12-23 – 2015-12-26 (×9): 325 mg via ORAL
  Filled 2015-12-23 (×9): qty 1

## 2015-12-23 MED ORDER — ACETAMINOPHEN 325 MG PO TABS: 650.0000 mg | ORAL_TABLET | Freq: Four times a day (QID) | ORAL | Status: DC | PRN

## 2015-12-23 MED ORDER — ACETAMINOPHEN 650 MG RE SUPP
650.0000 mg | Freq: Once | RECTAL | Status: AC
  Administered 2015-12-23: 650 mg via RECTAL

## 2015-12-23 MED ORDER — QUETIAPINE FUMARATE 25 MG PO TABS
25.0000 mg | ORAL_TABLET | Freq: Two times a day (BID) | ORAL | Status: DC
Start: 2015-12-23 — End: 2015-12-26
  Administered 2015-12-23 – 2015-12-26 (×6): 25 mg via ORAL
  Filled 2015-12-23 (×7): qty 1

## 2015-12-23 MED ORDER — RIVASTIGMINE TARTRATE 1.5 MG PO CAPS
1.5000 mg | ORAL_CAPSULE | Freq: Two times a day (BID) | ORAL | Status: DC
  Administered 2015-12-23 – 2015-12-26 (×6): 1.5 mg via ORAL
  Filled 2015-12-23 (×10): qty 1

## 2015-12-23 NOTE — Progress Notes (Signed)
Couldn't do any admission stuff as patient is very confused.

## 2015-12-23 NOTE — ED Provider Notes (Signed)
CSN: [id number]     Arrival date & time 12/23/15  [id number] History   First MD Initiated Contact with Patient 12/23/15 [phone]     Chief Complaint  Patient presents with  . Code Sepsis     (Consider location/radiation/quality/duration/timing/severity/associated sxs/prior Treatment) Patient is a 70 y.o. female presenting with fever. The history is provided by the nursing home.  Fever Max temp prior to arrival:  103 Temp source:  Oral Severity:  Unable to specify Onset quality:  Unable to specify Timing:  Unable to specify Progression:  Unable to specify Associated symptoms: confusion and cough     Past Medical History  Diagnosis Date  . MELANOMA, HX OF 08/04/2007  . ALLERGIC RHINITIS 06/23/2007  . ANXIETY DEPRESSION 06/12/2009  . ANXIETY 06/23/2007  . 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 GERD 06/23/2007  . HYPERLIPIDEMIA 06/24/2007  . HYPERTENSION 06/23/2007  . COLONIC POLYPS, HX OF 03/08/2002  . DIVERTICULOSIS, COLON 08/04/2007  . Irritable bowel syndrome 06/23/2007  . Memory dysfunction 06/23/2011  . Cancer ([place])     skin cx/ hx melanoma  . LOW BACK PAIN 06/24/2007  . Alcohol abuse 02/27/2012   Past Surgical History  Procedure Laterality Date  . Cesarean section      x 2  . Colonoscopy    . Appendectomy     Family History  Problem Relation Age of Onset  . Hypertension Other   . Diabetes Other   . Colon cancer Cousin    Social History  Substance Use Topics  . Smoking status: Never Smoker   . Smokeless tobacco: Never Used  . Alcohol Use: 25.2 oz/week    42 Glasses of wine per week   OB History    No data available     Review of Systems  Unable to perform ROS: Dementia  Constitutional: Positive for fever.  Respiratory: Positive for cough.   Psychiatric/Behavioral: Positive for confusion.      Allergies  Codeine phosphate  Home Medications   Prior to Admission medications   Medication Sig Start Date End Date Taking? Authorizing Provider   amLODipine (NORVASC) 5 MG tablet Take 1 tablet (5 mg total) by mouth daily. 03/17/15  Yes [name], MD  apixaban (ELIQUIS) 5 MG TABS tablet Take 5 mg by mouth 2 (two) times daily.    Yes Historical Provider, MD  divalproex (DEPAKOTE SPRINKLE) 125 MG capsule Take 250 mg by mouth 3 (three) times daily.    Yes Historical Provider, MD  escitalopram (LEXAPRO) 5 MG tablet Take 5 mg by mouth daily.   Yes Historical Provider, MD  ferrous sulfate 325 (65 FE) MG tablet Take 1 tablet (325 mg total) by mouth 3 (three) times daily after meals. 03/17/15  Yes [name], MD  folic acid (FOLVITE) 1 MG tablet Take 1 tablet (1 mg total) by mouth daily. 03/17/15  Yes [name], MD  LORazepam (ATIVAN) 0.5 MG tablet Take 1 mg by mouth every 8 (eight) hours.   Yes Historical Provider, MD  Multiple Vitamin (MULTIVITAMIN WITH MINERALS) TABS tablet Take 1 tablet by mouth daily. 03/17/15  Yes [name], MD  QUEtiapine (SEROQUEL) 50 MG tablet Take 25-50 mg by mouth 2 (two) times daily. Take 0.5 tablet (25 mg) in the am and Take 1 tablet (50 mg) in the evening.   Yes Historical Provider, MD  rivastigmine (EXELON) 1.5 MG capsule Take 1.5 mg by mouth 2 (two) times daily.   Yes Historical Provider, MD  thiamine  100 MG tablet Take 1 tablet (100 mg total) by mouth daily. 03/17/15  Yes [name], MD  traMADol (ULTRAM) 50 MG tablet Take 50 mg by mouth every 8 (eight) hours.   Yes Historical Provider, MD   BP 123/71 mmHg  Pulse 83  Temp(Src) 101.7 F (38.7 C) (Rectal)  Resp 20  SpO2 96% Physical Exam  Constitutional: She is oriented to person, place, and time. She appears well-developed and well-nourished.  HENT:  Head: Normocephalic and atraumatic.  Neck: Normal range of motion.  Cardiovascular: Normal rate and regular rhythm.   Pulmonary/Chest: Effort normal. No stridor. No respiratory distress.  Abdominal: Soft. She exhibits no distension. There is no tenderness.  Genitourinary: Guaiac  negative stool.  Musculoskeletal: Normal range of motion. She exhibits no edema or tenderness.  Neurological: She is alert and oriented to person, place, and time.  Skin: Skin is warm and dry.  Nursing note and vitals reviewed.   ED Course  Procedures (including critical care time) Labs Review Labs Reviewed  COMPREHENSIVE METABOLIC PANEL - Abnormal; Notable for the following:    CO2 21 (*)    BUN 27 (*)    Creatinine, Ser 1.72 (*)    Calcium 8.6 (*)    Albumin 2.9 (*)    ALT 10 (*)    GFR calc non Af Amer 29 (*)    GFR calc Af Amer 34 (*)    All other components within normal limits  URINALYSIS, ROUTINE W REFLEX MICROSCOPIC (NOT AT [facility]) - Abnormal; Notable for the following:    APPearance TURBID (*)    Hgb urine dipstick LARGE (*)    Ketones, ur 15 (*)    Protein, ur 100 (*)    Leukocytes, UA LARGE (*)    All other components within normal limits  CBC WITH DIFFERENTIAL/PLATELET - Abnormal; Notable for the following:    RBC 3.70 (*)    Hemoglobin 10.2 (*)    HCT 32.8 (*)    Monocytes Absolute 1.5 (*)    All other components within normal limits  URINE MICROSCOPIC-ADD ON - Abnormal; Notable for the following:    Squamous Epithelial / LPF 0-5 (*)    Bacteria, UA MANY (*)    All other components within normal limits  BASIC METABOLIC PANEL - Abnormal; Notable for the following:    Sodium 146 (*)    Potassium 2.9 (*)    Chloride 116 (*)    CO2 21 (*)    Creatinine, Ser 1.24 (*)    Calcium 7.8 (*)    GFR calc non Af Amer 44 (*)    GFR calc Af Amer 51 (*)    All other components within normal limits  CBC - Abnormal; Notable for the following:    RBC 2.93 (*)    Hemoglobin 8.2 (*)    HCT 26.1 (*)    All other components within normal limits  OCCULT BLOOD, POC DEVICE - Abnormal; Notable for the following:    Fecal Occult Bld POSITIVE (*)    All other components within normal limits  C DIFFICILE QUICK SCREEN W PCR REFLEX  MRSA PCR SCREENING  CULTURE, BLOOD (ROUTINE X  2)  CULTURE, BLOOD (ROUTINE X 2)  URINE CULTURE  INFLUENZA PANEL BY PCR (TYPE A & B, H1N1)  I-STAT CG4 LACTIC ACID, ED  POC OCCULT BLOOD, ED  POCT GASTRIC OCCULT BLOOD (1-CARD TO LAB)  I-STAT CG4 LACTIC ACID, ED    Imaging Review No results found. I have  personally reviewed and evaluated these images and lab results as part of my medical decision-making.   EKG Interpretation   Date/Time:  Tuesday December 23 2015 06:45:17 EDT Ventricular Rate:  83 PR Interval:  154 QRS Duration: 99 QT Interval:  372 QTC Calculation: 437 R Axis:   -48 Text Interpretation:  Sinus rhythm Left axis deviation Borderline low  voltage, extremity leads Confirmed by [facility] MD, Corene Cornea [phone]) on  12/23/2015 7:29:30 AM      MDM   Final diagnoses:  Sepsis, due to unspecified organism [facility])  UTI (lower urinary tract infection)  Acute-on-chronic kidney injury ([place])    From nursing home 2/2 fever. Meets criteria for SIRS and Appears to be 2/2 UTI, also has had s/s PNA per facility. So Treated for sepsis with vanc/zosyn, fluids, not severe sepsis 2/2 normal BP and lactate here, so admitted to medicine, tele.     Merrily Pew, MD 12/24/15 [phone]

## 2015-12-23 NOTE — ED Notes (Signed)
Pt arrives via EMS from [facility] as a code sepsis, per SNF staff patient was only responsive to pain and is typically alert and active. Has only said "ouch" to ED staff. Axillary temp 103.6, ice packs applied.

## 2015-12-23 NOTE — ED Notes (Signed)
Activity apron ordered for pt & will be transported to the floor with the pt

## 2015-12-23 NOTE — Clinical Social Work Note (Signed)
Patient from [facility]. Assessment will be completed and CSW will provide social work services as needed and facilitate discharge back to facility when medically stable.  Lestine Rahe Givens, MSW, LCSW Licensed Clinical Social Worker [place] [phone]

## 2015-12-23 NOTE — Progress Notes (Signed)
VASCULAR LAB PRELIMINARY  PRELIMINARY  PRELIMINARY  PRELIMINARY  Right lower extremity venous duplex completed.    Preliminary report:  Right:  No obvious evidence of DVT, superficial thrombosis, or Baker's cyst. Technically difficult due to constant movement  Roshini Fulwider, RVS 12/23/2015, 2:45 PM

## 2015-12-23 NOTE — H&P (Signed)
[facility] History and Physical                                                                                    Tara Shaffer, is a 70 y.o. female  MRN: [id number]   DOB - May 01, 1946  Admit Date - 12/23/2015  Outpatient Primary MD for the patient is [name], MD  Referring MD: Dolly Rias / ER  PMH: Past Medical History  Diagnosis Date  . MELANOMA, HX OF 08/04/2007  . ALLERGIC RHINITIS 06/23/2007  . ANXIETY DEPRESSION 06/12/2009  . ANXIETY 06/23/2007  . 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 GERD 06/23/2007  . HYPERLIPIDEMIA 06/24/2007  . HYPERTENSION 06/23/2007  . COLONIC POLYPS, HX OF 03/08/2002  . DIVERTICULOSIS, COLON 08/04/2007  . Irritable bowel syndrome 06/23/2007  . Memory dysfunction 06/23/2011  . Cancer ([place])     skin cx/ hx melanoma  . LOW BACK PAIN 06/24/2007  . Alcohol abuse 02/27/2012      PSH: Past Surgical History  Procedure Laterality Date  . Cesarean section      x 2  . Colonoscopy    . Appendectomy       CC:  Chief Complaint  Patient presents with  . 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 Patient was found to be responsive only to pain at the nursing facility. EMS was called and patient was sent to the ER for further evaluation. Upon arrival to the ER her axillary temperature was 103.6. Per facility staff at baseline she is alert and active and uses a wheelchair for ambulation.  ER Evaluation and treatment: Initial  axillary temp 103.6 with repeat temperature after Tylenol 101.7-BP 120/71-pulse 83-respirations 20-room air saturations 96% Repeat vital signs rectal temperature 99.2-BP 99/88-pulse 88 - respirations 12 EKG: Sinus rhythm with a ventricular rate of 83 bpm, QTC 437 ms, no acute ST segment or T-wave changes that would be concerning for ischemia 2 View CXR: Pending at time of dictation Lab data: Na 141, K 3.9,CO2 21, BUN 27, creatinine 1.7 to, lactic acid 1.55, WBCs 9700 with normal differential, hemoglobin 10.2, platelets 263,000; urinalysis highly abnormal with turbid appearance, many bacteria, large hemoglobin, 15 ketones, large leukocytes, 100 protein, 0-5 squamous epithelials, too numerous to count WBCs, blood cultures 2 as well as urine culture obtained in the ER, fecal occult blood positive although patient incontinent of bowel movement while I was in room and no frank blood or melena noted Tylenol 650 mg PR 1 Vancomycin 1 g IV 1 Zosyn 3.375 g IV 1 2 L normal saline IV bolus  Review of Systems   In addition to the HPI above,  **Unable to obtain from patient due to acute altered  mentation-formation obtained from the EMR  Social History Social History  Substance Use Topics  . Smoking status: Never Smoker   . Smokeless tobacco: Never Used  . Alcohol Use: 25.2 oz/week    42 Glasses of wine per week    Resides at: [facility] assisted living  Lives with: [name]  Ambulatory status: Based on previous documentation patient ambulatory only with wheelchair secondary to history of nonoperative right femoral neck fracture   Family History Family History  Problem Relation Age of Onset  . Hypertension Other   . Diabetes Other   . Colon cancer Cousin      Prior to Admission medications   Medication Sig Start Date End Date Taking? Authorizing Provider  amLODipine (NORVASC) 5 MG tablet Take 1 tablet (5 mg total) by mouth daily. 03/17/15  Yes [name], MD  apixaban (ELIQUIS) 5  MG TABS tablet Take 5 mg by mouth 2 (two) times daily.    Yes Historical Provider, MD  divalproex (DEPAKOTE SPRINKLE) 125 MG capsule Take 250 mg by mouth 3 (three) times daily.    Yes Historical Provider, MD  escitalopram (LEXAPRO) 5 MG tablet Take 5 mg by mouth daily.   Yes Historical Provider, MD  ferrous sulfate 325 (65 FE) MG tablet Take 1 tablet (325 mg total) by mouth 3 (three) times daily after meals. 03/17/15  Yes [name], MD  folic acid (FOLVITE) 1 MG tablet Take 1 tablet (1 mg total) by mouth daily. 03/17/15  Yes [name], MD  LORazepam (ATIVAN) 0.5 MG tablet Take 1 mg by mouth every 8 (eight) hours.   Yes Historical Provider, MD  Multiple Vitamin (MULTIVITAMIN WITH MINERALS) TABS tablet Take 1 tablet by mouth daily. 03/17/15  Yes [name], MD  QUEtiapine (SEROQUEL) 50 MG tablet Take 25-50 mg by mouth 2 (two) times daily. Take 0.5 tablet (25 mg) in the am and Take 1 tablet (50 mg) in the evening.   Yes Historical Provider, MD  rivastigmine (EXELON) 1.5 MG capsule Take 1.5 mg by mouth 2 (two) times daily.   Yes Historical Provider, MD  thiamine 100 MG tablet Take 1 tablet (100 mg total) by mouth daily. 03/17/15  Yes [name], MD  traMADol (ULTRAM) 50 MG tablet Take 50 mg by mouth every 8 (eight) hours.   Yes Historical Provider, MD    Allergies  Allergen Reactions  . Codeine Phosphate Nausea Only    Physical Exam  Vitals  Blood pressure 99/88, pulse 84, temperature 99.2 F (37.3 C), temperature source Rectal, resp. rate 12, SpO2 96 %.   General:  In no acute distress, appears Chronically ill and older than stated age  Psych: Confused and attempts to communicate verbally but cannot formulate a thought process when asked a question  Neuro:   No obvious focal neurological deficits, CN II through XII intact, Strength 4-5/5 all 4 extremities testing very limited secondary to patient's inability to follow simple commands participate with exam,  Sensation intact all 4 extremities.  ENT:  Ears and Eyes appear Normal, Conjunctivae clear, PER. Moist oral mucosa without erythema or exudates.  Neck:  Supple, No lymphadenopathy appreciated  Respiratory:  Symmetrical chest wall movement, Good air movement bilaterally, CTAB. Room Air  Cardiac:  RRR, No Murmurs, marked RLE edema noted, no JVD, No carotid bruits, peripheral pulses palpable at 2+  Abdomen:  Positive bowel sounds, Soft, minimally tender lower abdomen, Non distended,  No masses appreciated, no obvious hepatosplenomegaly; incontinent of diarrheal  stool  Skin:  No Cyanosis, Normal Skin Turgor  Extremities: Symmetrical without obvious trauma or injury,  no effusions.  Data Review  CBC  Recent Labs Lab 12/23/15 0645  WBC 9.7  HGB 10.2*  HCT 32.8*  PLT 263  MCV 88.6  MCH 27.6  MCHC 31.1  RDW 14.8  LYMPHSABS 0.9  MONOABS 1.5*  EOSABS 0.0  BASOSABS 0.0    Chemistries   Recent Labs Lab 12/23/15 0645  NA 141  K 3.9  CL 107  CO2 21*  GLUCOSE 90  BUN 27*  CREATININE 1.72*  CALCIUM 8.6*  AST 19  ALT 10*  ALKPHOS 79  BILITOT 0.8    estimated creatinine clearance is 26.9 mL/min (by C-G formula based on Cr of 1.72).  No results for input(s): TSH, T4TOTAL, T3FREE, THYROIDAB in the last 72 hours.  Invalid input(s): FREET3  Coagulation profile No results for input(s): INR, PROTIME in the last 168 hours.  No results for input(s): DDIMER in the last 72 hours.  Cardiac Enzymes No results for input(s): CKMB, TROPONINI, MYOGLOBIN in the last 168 hours.  Invalid input(s): CK  Invalid input(s): POCBNP  Urinalysis    Component Value Date/Time   COLORURINE YELLOW 12/23/2015 0715   APPEARANCEUR TURBID* 12/23/2015 0715   LABSPEC 1.013 12/23/2015 0715   PHURINE 6.0 12/23/2015 0715   GLUCOSEU NEGATIVE 12/23/2015 0715   GLUCOSEU NEGATIVE 09/06/2012 0858   HGBUR LARGE* 12/23/2015 0715   BILIRUBINUR NEGATIVE 12/23/2015 0715   KETONESUR 15* 12/23/2015  0715   PROTEINUR 100* 12/23/2015 0715   UROBILINOGEN 1.0 03/13/2015 1747   NITRITE NEGATIVE 12/23/2015 0715   LEUKOCYTESUR LARGE* 12/23/2015 0715    Imaging results:   Ct Head Wo Contrast  12/14/2015  CLINICAL DATA:  Unwitnessed fall, fell 5 times in the past week. EXAM: CT HEAD WITHOUT CONTRAST CT CERVICAL SPINE WITHOUT CONTRAST TECHNIQUE: Multidetector CT imaging of the head and cervical spine was performed following the standard protocol without intravenous contrast. Multiplanar CT image reconstructions of the cervical spine were also generated. COMPARISON:  11/24/2015 FINDINGS: CT HEAD FINDINGS There is no intracranial hemorrhage, mass or evidence of acute infarction. There is moderate generalized atrophy. There is moderate chronic microvascular ischemic change. There is no significant extra-axial fluid collection. No interval change from 11/24/2015. No acute intracranial findings are evident. Calvarium and skullbase are intact. Visible paranasal sinuses are clear. CT CERVICAL SPINE FINDINGS The vertebral column, pedicles and facet articulations are intact. There is no evidence of acute fracture. No acute soft tissue abnormalities are evident. Moderate degenerative cervical disc disease is present from C3 through C6. Mild curvature reversal at C4. IMPRESSION: 1. Negative for acute intracranial traumatic injury. There is moderate generalized atrophy and chronic microvascular ischemic change. 2. Negative for acute cervical spine fracture. Electronically Signed   By: Andreas Newport M.D.   On: 12/14/2015 21:41   Ct Head Wo Contrast  11/24/2015  CLINICAL DATA:  Frequent falls, last this morning. Initial encounter. EXAM: CT HEAD WITHOUT CONTRAST TECHNIQUE: Contiguous axial images were obtained from the base of the skull through the vertex without intravenous contrast. COMPARISON:  Head CT scan 11/23/2015 FINDINGS: Cortical atrophy and chronic microvascular ischemic change are seen. No evidence of acute  intracranial abnormality including hemorrhage, infarct, mass lesion, mass effect, midline shift or abnormal extra-axial fluid collection is identified. No hydrocephalus or pneumocephalus. The calvarium is intact. Imaged paranasal sinuses and mastoid air cells are clear. IMPRESSION: No acute abnormality. Atrophy and chronic microvascular ischemic change. Electronically Signed  By: Inge Rise M.D.   On: 11/24/2015 09:56   Ct Head Wo Contrast  11/23/2015  CLINICAL DATA:  Fall striking the back of the head today.  Dementia. EXAM: CT HEAD WITHOUT CONTRAST CT CERVICAL SPINE WITHOUT CONTRAST TECHNIQUE: Multidetector CT imaging of the head and cervical spine was performed following the standard protocol without intravenous contrast. Multiplanar CT image reconstructions of the cervical spine were also generated. COMPARISON:  Multiple exams, including 11/11/2015 and 08/21/2015 FINDINGS: CT HEAD FINDINGS 3 mm old lacunar infarct along the posterior limb left internal capsule. Otherwise, The brainstem, cerebellum, cerebral peduncles, thalami, basal ganglia, basilar cisterns, and ventricular system appear within normal limits. Periventricular white matter and corona radiata hypodensities favor chronic ischemic microvascular white matter disease. No intracranial hemorrhage, mass lesion, or acute CVA. Visualized paranasal sinuses appear clear. There is atherosclerotic calcification of the cavernous carotid arteries bilaterally. CT CERVICAL SPINE FINDINGS Cervical spondylosis noted with loss of disc height at all levels between C3 and C7 and multilevel uncinate and facet spurring potentially contributing to low-level neural foraminal stenosis on the left at C5-6 and C6-7. Fused facet joint on the left at C2- 3. No fracture or acute subluxation is identified. IMPRESSION: 1. No acute intracranial findings or acute cervical spine findings. 2. Periventricular white matter and corona radiata hypodensities favor chronic  ischemic microvascular white matter disease. 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 3. Multilevel cervical spondylosis. Electronically Signed   By: Van Clines M.D.   On: 11/23/2015 21:08   Ct Cervical Spine Wo Contrast  12/14/2015  CLINICAL DATA:  Unwitnessed fall, fell 5 times in the past week. EXAM: CT HEAD WITHOUT CONTRAST CT CERVICAL SPINE WITHOUT CONTRAST TECHNIQUE: Multidetector CT imaging of the head and cervical spine was performed following the standard protocol without intravenous contrast. Multiplanar CT image reconstructions of the cervical spine were also generated. COMPARISON:  11/24/2015 FINDINGS: CT HEAD FINDINGS There is no intracranial hemorrhage, mass or evidence of acute infarction. There is moderate generalized atrophy. There is moderate chronic microvascular ischemic change. There is no significant extra-axial fluid collection. No interval change from 11/24/2015. No acute intracranial findings are evident. Calvarium and skullbase are intact. Visible paranasal sinuses are clear. CT CERVICAL SPINE FINDINGS The vertebral column, pedicles and facet articulations are intact. There is no evidence of acute fracture. No acute soft tissue abnormalities are evident. Moderate degenerative cervical disc disease is present from C3 through C6. Mild curvature reversal at C4. IMPRESSION: 1. Negative for acute intracranial traumatic injury. There is moderate generalized atrophy and chronic microvascular ischemic change. 2. Negative for acute cervical spine fracture. Electronically Signed   By: Andreas Newport M.D.   On: 12/14/2015 21:41   Ct Cervical Spine Wo Contrast  11/23/2015  CLINICAL DATA:  Fall striking the back of the head today.  Dementia. EXAM: CT HEAD WITHOUT CONTRAST CT CERVICAL SPINE WITHOUT CONTRAST TECHNIQUE: Multidetector CT imaging of the head and cervical spine was performed following the standard protocol without intravenous contrast.  Multiplanar CT image reconstructions of the cervical spine were also generated. COMPARISON:  Multiple exams, including 11/11/2015 and 08/21/2015 FINDINGS: CT HEAD FINDINGS 3 mm old lacunar infarct along the posterior limb left internal capsule. Otherwise, The brainstem, cerebellum, cerebral peduncles, thalami, basal ganglia, basilar cisterns, and ventricular system appear within normal limits. Periventricular white matter and corona radiata hypodensities favor chronic ischemic microvascular white matter disease. No intracranial hemorrhage, mass lesion, or acute CVA. Visualized paranasal sinuses appear clear. There is  atherosclerotic calcification of the cavernous carotid arteries bilaterally. CT CERVICAL SPINE FINDINGS Cervical spondylosis noted with loss of disc height at all levels between C3 and C7 and multilevel uncinate and facet spurring potentially contributing to low-level neural foraminal stenosis on the left at C5-6 and C6-7. Fused facet joint on the left at C2- 3. No fracture or acute subluxation is identified. IMPRESSION: 1. No acute intracranial findings or acute cervical spine findings. 2. Periventricular white matter and corona radiata hypodensities favor chronic ischemic microvascular white matter disease. 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 3. Multilevel cervical spondylosis. Electronically Signed   By: Van Clines M.D.   On: 11/23/2015 21:08     EKG: (Independently reviewed)  Sinus rhythm with a ventricular rate of 83 bpm, QTC 437 ms, no acute ST segment or T-wave changes that would be concerning for ischemia   Assessment & Plan  Principal Problem:   SIRS  -Patient presents with febrile illness and altered mentation but has normal white count, did not have tachypnea or tachycardia although she does have acute kidney injury but this is more likely reflective of volume depletion her systolic blood pressures also greater than 100 so less likely  this is truly a sepsis process and more consistent with SIRS -Suspect UTI source -Because patient is resident of assisted living facility will check influenza PCR -Did have loose stool so we'll check C. difficile PCR  Active Problems:   UTI / Pyelonephritis -Appears to be the primary source of the patient's febrile illness -Was given broad-spectrum antibiotics initially in the ER due to suspected sepsis but we'll narrow to Rocephin -Follow up on blood and urine cultures   Acute delirium -Secondary to acute infectious process in patient with known dementia (see below)    AKI / CKD stage 2, GFR 60-89 ml/min -Baseline renal function he went 14 and creatinine 1.34 -Current BUN 27 with creatinine 1.72 which is concerning for volume depletion -Patient was also heme positive so could be reflective of upper GI bleeding so monitor closely since patient is on eliquis    Essential hypertension -Blood pressure well controlled but somewhat suboptimal in setting of dehydration and infection so we'll hold preadmission Norvasc    Dementia with behavioral disturbance/Recurrent falls -Continue preadmission psych meds    DVT, femoral, chronic/Right leg swelling -Has persistent edema and is on chronic eliquis -Check venous duplex    HLD  -Not all medications prior to admission    Malnutrition of moderate degree  -Nutrition consultation    Anemia of chronic disease/FOB positve -Hemoglobin slightly more elevated than baseline of around 9.5 -Continue to follow after hydration -No evidence of frank bleeding    Fracture of femoral neck, right -Chronic in nature with initial injury in June 2016 -Recent x-ray right femur February 2017 demonstrated continued right femoral neck fracture with further so. Displacement compared to follow up down from July 2016; also history of remote right pubic rami fractures -Apparently mobilizes with wheelchair facility -PT evaluation this admission    DVT  Prophylaxis: Eliquis  Family Communication:  No family at bedside   Code Status:  Full code  Condition:  Stable  Discharge disposition: Anticipate discharge back to assisted living facility once medically stable  Time spent in minutes : 60      ELLIS,ALLISON L. ANP on 12/23/2015 at 9:51 AM  You may contact me by going to [email or web address] - password [email or web address]  I am available from 7a-7p but please confirm I  am on the schedule by going to Amion as above.   After 7p please contact night coverage person covering me after hours  [facility]

## 2015-12-23 NOTE — Progress Notes (Signed)
Admission note:  Arrival Method: Patient arrived in stretcher accompanied by the staff. Mental Orientation: Alert and oriented to self but confused. Telemetry: 6E-30, NSR, CCMD notified. Assessment: See doc flow sheets. Skin:stage one on the mid sacral area, warm dry and intact.  Bruises noted on the both upper and lower extremities. IV: Left wrist IV NS 134ml/hr. Pain: currently denies any. Tubes: None. Safety Measures: Bed in low position, call light and phone within reach. Fall Prevention Safety Plan: in progress. Admission Screening: I nprocess. 6700 Orientation: Patient has been oriented to the unit, staff and to the room.

## 2015-12-23 NOTE — Progress Notes (Signed)
Initial Nutrition Assessment  DOCUMENTATION CODES:   Non-severe (moderate) malnutrition in context of chronic illness  INTERVENTION:  Provide Boost Breeze po TID, each supplement provides 250 kcal and 9 grams of protein.  Encourage adequate PO intake.   NUTRITION DIAGNOSIS:   Malnutrition related to chronic illness as evidenced by moderate depletion of body fat, moderate depletions of muscle mass.  GOAL:   Patient will meet greater than or equal to 90% of their needs  MONITOR:   PO intake, Supplement acceptance, Diet advancement, Weight trends, Labs, I & O's, Skin  REASON FOR ASSESSMENT:   Consult Assessment of nutrition requirement/status  ASSESSMENT:   70 y.o. female with a Past Medical History of IBS, dementia, ETOH abuse, chronic DVT, chronic R fem neck fracture who presents with SIRS from UTI/pyelo.  Pt reports appetite is fine currently and PTA. Pt reports usually consuming 3 meals a day. No meal completion recorded, however pt reports ~25% this AM. Pt with no weight loss from Epic weight records. RD to order Boost Breeze to aid in caloric and protein needs. Pt was encouraged to eat her food at meals.   Nutrition-Focused physical exam completed. Findings are moderate fat depletion, mild to moderate muscle depletion, and no edema.   Labs and medications reviewed.   Diet Order:  Diet clear liquid Room service appropriate?: Yes; Fluid consistency:: Thin  Skin:  Wound (see comment) (Stage I pressure ulcer on sacrum)  Last BM:  Unknown  Height:   Ht Readings from Last 1 Encounters:  12/15/15 5\' 5"  (1.651 m)    Weight:   Wt Readings from Last 1 Encounters:  12/23/15 137 lb 2 oz (62.2 kg)    Ideal Body Weight:  56.8 kg  BMI:  Body mass index is 22.82 kg/(m^2).  Estimated Nutritional Needs:   Kcal:  1700-1850  Protein:  75-90 grams  Fluid:  1.7 - 1.8 L/day  EDUCATION NEEDS:   No education needs identified at this time  Corrin Parker, MS, RD,  LDN Pager # [phone] After hours/ weekend pager # [phone]

## 2015-12-24 DIAGNOSIS — E440 Moderate protein-calorie malnutrition: Secondary | ICD-10-CM | POA: Diagnosis present

## 2015-12-24 DIAGNOSIS — G9340 Encephalopathy, unspecified: Secondary | ICD-10-CM | POA: Diagnosis present

## 2015-12-24 DIAGNOSIS — X58XXXA Exposure to other specified factors, initial encounter: Secondary | ICD-10-CM | POA: Diagnosis present

## 2015-12-24 DIAGNOSIS — N182 Chronic kidney disease, stage 2 (mild): Secondary | ICD-10-CM

## 2015-12-24 DIAGNOSIS — Z6822 Body mass index (BMI) 22.0-22.9, adult: Secondary | ICD-10-CM | POA: Diagnosis not present

## 2015-12-24 DIAGNOSIS — N390 Urinary tract infection, site not specified: Secondary | ICD-10-CM

## 2015-12-24 DIAGNOSIS — D631 Anemia in chronic kidney disease: Secondary | ICD-10-CM | POA: Diagnosis present

## 2015-12-24 DIAGNOSIS — E785 Hyperlipidemia, unspecified: Secondary | ICD-10-CM | POA: Diagnosis present

## 2015-12-24 DIAGNOSIS — F0391 Unspecified dementia with behavioral disturbance: Secondary | ICD-10-CM | POA: Diagnosis present

## 2015-12-24 DIAGNOSIS — Z993 Dependence on wheelchair: Secondary | ICD-10-CM | POA: Diagnosis not present

## 2015-12-24 DIAGNOSIS — B965 Pseudomonas (aeruginosa) (mallei) (pseudomallei) as the cause of diseases classified elsewhere: Secondary | ICD-10-CM | POA: Diagnosis present

## 2015-12-24 DIAGNOSIS — I82511 Chronic embolism and thrombosis of right femoral vein: Secondary | ICD-10-CM | POA: Diagnosis present

## 2015-12-24 DIAGNOSIS — F419 Anxiety disorder, unspecified: Secondary | ICD-10-CM | POA: Diagnosis present

## 2015-12-24 DIAGNOSIS — N179 Acute kidney failure, unspecified: Secondary | ICD-10-CM | POA: Diagnosis not present

## 2015-12-24 DIAGNOSIS — Z7901 Long term (current) use of anticoagulants: Secondary | ICD-10-CM | POA: Diagnosis not present

## 2015-12-24 DIAGNOSIS — D638 Anemia in other chronic diseases classified elsewhere: Secondary | ICD-10-CM | POA: Diagnosis not present

## 2015-12-24 DIAGNOSIS — E876 Hypokalemia: Secondary | ICD-10-CM | POA: Diagnosis present

## 2015-12-24 DIAGNOSIS — E860 Dehydration: Secondary | ICD-10-CM | POA: Diagnosis present

## 2015-12-24 DIAGNOSIS — S72001A Fracture of unspecified part of neck of right femur, initial encounter for closed fracture: Secondary | ICD-10-CM | POA: Diagnosis present

## 2015-12-24 DIAGNOSIS — N12 Tubulo-interstitial nephritis, not specified as acute or chronic: Secondary | ICD-10-CM | POA: Diagnosis present

## 2015-12-24 DIAGNOSIS — I129 Hypertensive chronic kidney disease with stage 1 through stage 4 chronic kidney disease, or unspecified chronic kidney disease: Secondary | ICD-10-CM | POA: Diagnosis present

## 2015-12-24 DIAGNOSIS — F39 Unspecified mood [affective] disorder: Secondary | ICD-10-CM | POA: Diagnosis present

## 2015-12-24 DIAGNOSIS — N3000 Acute cystitis without hematuria: Secondary | ICD-10-CM | POA: Diagnosis not present

## 2015-12-24 DIAGNOSIS — E869 Volume depletion, unspecified: Secondary | ICD-10-CM | POA: Diagnosis present

## 2015-12-24 DIAGNOSIS — A419 Sepsis, unspecified organism: Secondary | ICD-10-CM | POA: Diagnosis present

## 2015-12-24 DIAGNOSIS — R296 Repeated falls: Secondary | ICD-10-CM | POA: Diagnosis present

## 2015-12-24 LAB — CBC
HEMATOCRIT: 26.1 % — AB (ref 36.0–46.0)
HEMOGLOBIN: 8.2 g/dL — AB (ref 12.0–15.0)
MCH: 28 pg (ref 26.0–34.0)
MCHC: 31.4 g/dL (ref 30.0–36.0)
MCV: 89.1 fL (ref 78.0–100.0)
Platelets: 180 10*3/uL (ref 150–400)
RBC: 2.93 MIL/uL — ABNORMAL LOW (ref 3.87–5.11)
RDW: 14.6 % (ref 11.5–15.5)
WBC: 6.6 10*3/uL (ref 4.0–10.5)

## 2015-12-24 LAB — BASIC METABOLIC PANEL
ANION GAP: 9 (ref 5–15)
BUN: 19 mg/dL (ref 6–20)
CHLORIDE: 116 mmol/L — AB (ref 101–111)
CO2: 21 mmol/L — ABNORMAL LOW (ref 22–32)
Calcium: 7.8 mg/dL — ABNORMAL LOW (ref 8.9–10.3)
Creatinine, Ser: 1.24 mg/dL — ABNORMAL HIGH (ref 0.44–1.00)
GFR calc Af Amer: 51 mL/min — ABNORMAL LOW (ref >60)
GFR, EST NON AFRICAN AMERICAN: 44 mL/min — AB (ref >60)
Glucose, Bld: 92 mg/dL (ref 65–99)
POTASSIUM: 2.9 mmol/L — AB (ref 3.5–5.1)
SODIUM: 146 mmol/L — AB (ref 135–145)

## 2015-12-24 MED ORDER — POTASSIUM CHLORIDE 20 MEQ PO PACK
40.0000 meq | PACK | ORAL | Status: AC
  Administered 2015-12-24 (×2): 40 meq via ORAL
  Filled 2015-12-24 (×3): qty 2

## 2015-12-24 MED ORDER — LORAZEPAM 0.5 MG PO TABS
0.5000 mg | ORAL_TABLET | Freq: Four times a day (QID) | ORAL | Status: DC | PRN
  Administered 2015-12-24 – 2015-12-26 (×2): 0.5 mg via ORAL
  Filled 2015-12-24 (×3): qty 1

## 2015-12-24 MED ORDER — LORAZEPAM 1 MG PO TABS
1.0000 mg | ORAL_TABLET | Freq: Once | ORAL | Status: AC
  Administered 2015-12-24: 1 mg via ORAL
  Filled 2015-12-24: qty 1

## 2015-12-24 NOTE — Care Management Obs Status (Signed)
[facility] NOTIFICATION   Patient Details  Name: Tara Shaffer MRN: [id number] Date of Birth: [birth year]   Medicare Observation Status Notification Given:  Yes    Wenceslao Loper, Rory Percy, RN 12/24/2015, 11:33 AM

## 2015-12-24 NOTE — Evaluation (Signed)
Clinical/Bedside Swallow Evaluation Patient Details  Name: Tara Shaffer MRN: [id number] Date of Birth: November 22, 1945  Today's Date: 12/24/2015 Time: SLP Start Time (ACUTE ONLY): 1450 SLP Stop Time (ACUTE ONLY): 1503 SLP Time Calculation (min) (ACUTE ONLY): 13 min  Past Medical History:  Past Medical History  Diagnosis Date  . MELANOMA, HX OF 08/04/2007  . ALLERGIC RHINITIS 06/23/2007  . ANXIETY DEPRESSION 06/12/2009  . ANXIETY 06/23/2007  . 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 GERD 06/23/2007  . HYPERLIPIDEMIA 06/24/2007  . HYPERTENSION 06/23/2007  . COLONIC POLYPS, HX OF 03/08/2002  . DIVERTICULOSIS, COLON 08/04/2007  . Irritable bowel syndrome 06/23/2007  . Memory dysfunction 06/23/2011  . Cancer ([place])     skin cx/ hx melanoma  . LOW BACK PAIN 06/24/2007  . Alcohol abuse 02/27/2012   Past Surgical History:  Past Surgical History  Procedure Laterality Date  . Cesarean section      x 2  . Colonoscopy    . Appendectomy     HPI:  70 year old woman resident of assisted living facility, history of alcohol associated dementia with behavioral disturbance, presented with lethargy, decreased responsiveness. Found to have fever. Admitted for UTI, confusion; consideration was given to early sepsis or SIRS. RN reports difficulty chewing/swallowing yesterday.   Assessment / Plan / Recommendation Clinical Impression  Pt presents with normal oropharyngeal function marked by adequate mastication, brisk swallow response, no overt s/s of aspiration.  Passed three oz water test without deficit.  Pt's dementia did not appear to interfere with PO consumption at the time of this evaluation.  Recommend resuming heart healthy diet; no SLP f/u warranted.     Aspiration Risk  No limitations    Diet Recommendation   heart healthy diet; thin liquids  Medication Administration: Whole meds with liquid    Other  Recommendations Oral Care Recommendations: Oral care BID   Follow up  Recommendations  None    Frequency and Duration            Prognosis        Swallow Study   General Date of Onset: 12/23/15 HPI: 70 year old woman resident of assisted living facility, history of alcohol associated dementia with behavioral disturbance, presented with lethargy, decreased responsiveness. Found to have fever. Admitted for UTI, confusion; consideration was given to early sepsis or SIRS. Type of Study: Bedside Swallow Evaluation Previous Swallow Assessment: none per records Diet Prior to this Study: Thin liquids (clear liquids) Temperature Spikes Noted: No Respiratory Status: Room air History of Recent Intubation: No Behavior/Cognition: Alert;Cooperative;Confused Oral Cavity Assessment: Within Functional Limits Oral Care Completed by SLP: No Oral Cavity - Dentition: Poor condition Vision: Functional for self-feeding Self-Feeding Abilities: Able to feed self Patient Positioning: Upright in bed Baseline Vocal Quality: Normal Volitional Cough: Strong Volitional Swallow: Able to elicit    Oral/Motor/Sensory Function Overall Oral Motor/Sensory Function: Within functional limits   Ice Chips Ice chips: Within functional limits Presentation: Spoon   Thin Liquid Thin Liquid: Within functional limits Presentation: Cup;Straw    Nectar Thick Nectar Thick Liquid: Not tested   Honey Thick Honey Thick Liquid: Not tested   Puree Puree: Within functional limits Presentation: Spoon;Self Fed   Solid   GO   Solid: Within functional limits Presentation: Self Fed    Functional Assessment Tool Used: clinical judgment Functional Limitations: Swallowing Swallow Current Status [id number]): 0 percent impaired, limited or restricted Swallow Goal Status [id number]): 0 percent impaired, limited or restricted Swallow Discharge Status [phone]): 0 percent impaired, limited or restricted  Juan Quam Laurice 12/24/2015,3:20 PM

## 2015-12-24 NOTE — Evaluation (Signed)
Physical Therapy Evaluation Patient Details Name: Tara Shaffer MRN: [id number] DOB: Jul 10, 1946 Today's Date: 12/24/2015   History of Present Illness  Past Medical History of IBS, dementia, ETOH abuse, chronic DVT, chronic R fem neck fracture who presents with SIRS from UTI/pyelo.  Clinical Impression  Pt admitted with above diagnosis. Pt currently with functional limitations due to the deficits listed below (see PT Problem List). Very weak with BIL knee flexion contractures. Poor historian, but apparently transferring to w/c at ALF. Pt may require SNF if ALF cannot provide max assist for transfers at d/c. Pt will benefit from skilled PT to increase their independence and safety with mobility to allow discharge to the venue listed below.       Follow Up Recommendations SNF;Supervision/Assistance - 24 hour    Equipment Recommendations  None recommended by PT    Recommendations for Other Services       Precautions / Restrictions Precautions Precautions: Fall      Mobility  Bed Mobility Overal bed mobility: Needs Assistance Bed Mobility: Supine to Sit     Supine to sit: Mod assist     General bed mobility comments: Frequent cues for sequencing and initiating movement. with posterior lean requiring intermittent assist.  Transfers Overall transfer level: Needs assistance Equipment used: 2 person hand held assist Transfers: Sit to/from [employer];Lateral/Scoot Transfers Sit to Stand: Max assist;+2 physical assistance   Squat pivot transfers: Max assist    Lateral/Scoot Transfers: Mod assist General transfer comment: Max assist for sit<>stand +2, practiced several times from bed. Attempted several squat pivots in bed to higher level of bed with little success. VC for sequencing. Pt able to use UEs to assist with scoot into recliner with mod assist.  Ambulation/Gait                Stairs            Wheelchair Mobility    Modified Rankin  (Stroke Patients Only)       Balance Overall balance assessment: Needs assistance Sitting-balance support: Single extremity supported Sitting balance-Leahy Scale: Poor     Standing balance support: Bilateral upper extremity supported Standing balance-Leahy Scale: Zero                               Pertinent Vitals/Pain Pain Assessment: No/denies pain    Home Living Family/patient expects to be discharged to:: Unsure Living Arrangements: Other (Comment) (Coming from ALF) Available Help at Discharge: Other (Comment) (ALF) Type of Home: Assisted living         Home Equipment:  (Pt unable to report)      Prior Function Level of Independence: Needs assistance         Comments: Pt is a poor historian. Reports she is able to propel a wheelchair. From notes, she is coming from SNF     Hand Dominance        Extremity/Trunk Assessment   Upper Extremity Assessment: Defer to OT evaluation           Lower Extremity Assessment: RLE deficits/detail;LLE deficits/detail;Difficult to assess due to impaired cognition RLE Deficits / Details: Knee flexion contracture LLE Deficits / Details: Significant knee flexion contracture.     Communication   Communication: No difficulties  Cognition Arousal/Alertness: Awake/alert Behavior During Therapy: WFL for tasks assessed/performed Overall Cognitive Status: No family/caregiver present to determine baseline cognitive functioning  General Comments General comments (skin integrity, edema, etc.): Bowel incontinence, cleaned and linens changed    Exercises        Assessment/Plan    PT Assessment Patient needs continued PT services  PT Diagnosis Difficulty walking;Generalized weakness;Altered mental status   PT Problem List Decreased strength;Decreased activity tolerance;Decreased balance;Decreased mobility;Decreased cognition;Decreased knowledge of use of DME;Decreased safety  awareness  PT Treatment Interventions DME instruction;Functional mobility training;Therapeutic exercise;Therapeutic activities;Balance training;Neuromuscular re-education;Patient/family education;Cognitive remediation;Wheelchair mobility training   PT Goals (Current goals can be found in the Care Plan section) Acute Rehab PT Goals Patient Stated Goal: none stated PT Goal Formulation: Patient unable to participate in goal setting Time For Goal Achievement: 01/07/16 Potential to Achieve Goals: Fair    Frequency Min 2X/week   Barriers to discharge        Co-evaluation               End of Session Equipment Utilized During Treatment: Gait belt Activity Tolerance: Other (comment) (Limited by weakness and confusion) Patient left: in chair (at nurses station per RN request) Nurse Communication: Need for lift equipment;Mobility status         Time: [id number] PT Time Calculation (min) (ACUTE ONLY): 23 min   Charges:   PT Evaluation $PT Eval Moderate Complexity: 1 Procedure PT Treatments $Therapeutic Activity: 8-22 mins   PT G Codes:        Ellouise Newer 12/24/2015, 4:56 PM Camille Bal [place], [place]

## 2015-12-24 NOTE — Progress Notes (Signed)
PROGRESS NOTE  Tara Shaffer [id number] DOB: [birth year] DOA: 12/23/2015 PCP: [name], MD  Summary: 70 year old woman resident of assisted living facility, history of alcohol associated dementia with behavioral disturbance, presented with lethargy, decreased responsiveness. Found to have fever. Admitted for UTI, confusion; consideration was given to early sepsis or SIRS. Past Medical History of IBS, dementia, ETOH abuse, chronic DVT, chronic R fem neck fracture who presents with SIRS from UTI/pyelo. Sepsis orders stopped including additional fluid boluses and Vanc/Zos. Starting CTX.   Assessment/Plan: 1. UTI with fever, only one SIRS criterion on admission. Influenza negative. C. difficile PCR negative. Blood culture and urine culture negative. No evidence of sepsis. 2. AKI on CKD stage II. Acute issue appears resolved at this point.  3. Acute encephalopathy superimposed on dementia, she's had difficulty with anxiety, removing IVs and has been placed at the nurses station for safety. 4. Hypokalemia. 5. Anemia of CKD, appears to be at baseline. 6. Chronic DVT, chronic right femoral fx. Continue Eliquis. 7. Dementia with behavioral disturbance, associated with history of alcohol use. Continue Seroquel, divalproex, Exelon.   Appears improved. Now afebrile. Hemodynamics are stable.  Continue IV antibiotics. Stop IV fluids.  Replace potassium  Likely return to assisted living 3/30.  Code Status: full code DVT prophylaxis: Eliquis Family Communication: none present Disposition Plan: return to ALF  Murray Hodgkins, MD  [facility] Direct contact:  --Via amion app OR  --[email or web address]; password [email or web address] and click  [email or web address] contact night coverage as above 12/24/2015, 11:04 AM  LOS: 1 day   Consultants:    Procedures:    Antibiotics:  Ceftriaxone 3/29 >>  HPI/Subjective: No complaints. No pain. No shortness of breath.  Nursing reports the patient's been quite  agitated, trying to get up, anxious, has removed multiple IVs, currently at nursing station for safety.  Objective: Filed Vitals:   12/23/15 1019 12/23/15 2024 12/24/15 0456 12/24/15 0835  BP: 101/94 110/73 145/59 123/50  Pulse: 74 71 73 81  Temp: 98.6 F (37 C) 98.5 F (36.9 C) 98 F (36.7 C) 97.8 F (36.6 C)  TempSrc: Oral Oral Oral Oral  Resp: 18 19 17 18   Weight: 62.2 kg (137 lb 2 oz) 62.2 kg (137 lb 2 oz)    SpO2: 98% 97% 94% 92%    Intake/Output Summary (Last 24 hours) at 12/24/15 1104 Last data filed at 12/24/15 1041  Gross per 24 hour  Intake    120 ml  Output      0 ml  Net    120 ml     Filed Weights   12/23/15 1019 12/23/15 2024  Weight: 62.2 kg (137 lb 2 oz) 62.2 kg (137 lb 2 oz)    Exam:    General:  Appears calm and comfortableSitting in chair at nurses station. Follows simple commands, participates in exam. Cardiovascular: RRR, no m/r/g. No LE edema. Telemetry: A lot of artifact, difficult to interpret. Appears to be sinus. Patient will not wear telemetry. Respiratory: CTA bilaterally, no w/r/r. Normal respiratory effort. Abdomen: soft, ntnd Musculoskeletal: grossly normal tone BUE/BLE, moves all extremities to command Psychiatric: Difficult to assess mood and affect, speech fluent and appropriate Neurologic: grossly non-focal.  New data reviewed:  Potassium 2.9   Creatinine improved, 1.24  Hemoglobin stable 8.2   Right lower extremity venous Doppler negative  Scheduled Meds: . apixaban  2.5 mg Oral BID  . cefTRIAXone (ROCEPHIN)  IV  1 g Intravenous Q24H  . divalproex  250 mg Oral TID  . escitalopram  5 mg Oral Daily  . feeding supplement  1 Container Oral TID BM  . ferrous sulfate  325 mg Oral TID PC  . folic acid  1 mg Oral Daily  . LORazepam  1 mg Oral 3 times per day  . multivitamin with minerals  1 tablet Oral Daily  . QUEtiapine  25 mg Oral BID  . rivastigmine  1.5 mg Oral BID  . sodium chloride flush  3 mL Intravenous Q12H  .  thiamine  100 mg Oral Daily  . traMADol  50 mg Oral 3 times per day   Continuous Infusions: . sodium chloride 125 mL/hr at 12/23/15 0930    Principal Problem:   UTI (urinary tract infection) Active Problems:   Dementia with behavioral disturbance   Recurrent falls   Malnutrition of moderate degree (HCC)   DVT, femoral, chronic (HCC)   Right leg swelling   Mood disorder (HCC)   Anemia of chronic disease   CKD (chronic kidney disease) stage 2, GFR 60-89 ml/min   AKI (acute kidney injury) ([place])   Fracture of femoral neck, right ([place])   Time spent 20 minutes

## 2015-12-24 NOTE — Progress Notes (Signed)
Patient lost IV access earlier today doctor aware of it.

## 2015-12-25 LAB — URINE CULTURE: Culture: >=100000

## 2015-12-25 MED ORDER — CIPROFLOXACIN HCL 250 MG PO TABS
250.0000 mg | ORAL_TABLET | Freq: Two times a day (BID) | ORAL | Status: DC
  Administered 2015-12-25 – 2015-12-26 (×3): 250 mg via ORAL
  Filled 2015-12-25 (×3): qty 1

## 2015-12-25 MED ORDER — ENSURE ENLIVE PO LIQD
237.0000 mL | Freq: Two times a day (BID) | ORAL | Status: DC
  Administered 2015-12-25 – 2015-12-26 (×2): 237 mL via ORAL

## 2015-12-25 MED ORDER — DEXTROSE 5 % IV SOLN
1.0000 g | INTRAVENOUS | Status: DC
  Filled 2015-12-25: qty 1

## 2015-12-25 MED ORDER — APIXABAN 5 MG PO TABS
5.0000 mg | ORAL_TABLET | Freq: Two times a day (BID) | ORAL | Status: DC
  Administered 2015-12-25 – 2015-12-26 (×2): 5 mg via ORAL
  Filled 2015-12-25 (×2): qty 1

## 2015-12-25 NOTE — NC FL2 (Signed)
[place] LEVEL OF CARE SCREENING TOOL     IDENTIFICATION  Patient Name: Tara Shaffer Birthdate: 1946/03/18 Sex: female Admission Date (Current Location): 12/23/2015  [facility] and [place] Number:  Herbalist and Address:  [facility], [place] [address] [place], [place] [address]      Provider Number: [id number]  Attending Physician Name and Address:  Samuella Cota, MD  Relative Name and Phone Number:  Lenita Niskanen - son. Phone number [phone]    Current Level of Care: Hospital Recommended Level of Care: [place] Prior Approval Number:    Date Approved/Denied:   PASRR Number:    Discharge Plan: SNF    Current Diagnoses: Patient Active Problem List   Diagnosis Date Noted  . UTI (urinary tract infection) 12/24/2015  . SIRS (systemic inflammatory response syndrome) ([place]) 12/24/2015  . AKI (acute kidney injury) ([place]) 12/23/2015  . Fracture of femoral neck, right ([place]) 12/23/2015  . Anemia of chronic disease 08/29/2015  . CKD (chronic kidney disease) stage 2, GFR 60-89 ml/min 08/29/2015  . Closed wound of head 08/25/2015  . Mood disorder ([place]) 06/11/2015  . Right leg swelling 03/21/2015  . DVT, femoral, chronic ([place]) 03/20/2015  . Pressure ulcer 03/14/2015  . Malnutrition of moderate degree ([place]) 03/14/2015  . Closed right hip fracture ([place]) 03/13/2015  . Recurrent falls 10/31/2014  . Impaired glucose tolerance 10/31/2014  . Scalp laceration 08/04/2014  . Posterior pain of right hip 12/19/2012  . 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 Alcohol abuse 02/27/2012  . Insomnia 09/17/2011  . Dementia with behavioral disturbance 06/23/2011  . Preventative health care 02/01/2011  . ANXIETY DEPRESSION 06/12/2009  . ELECTROCARDIOGRAM, ABNORMAL 08/20/2008  . 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 DIVERTICULOSIS, COLON 08/04/2007  . MELANOMA, HX OF  08/04/2007  . HLD (hyperlipidemia) 06/24/2007  . Morbid obesity ([place]) 06/23/2007  . Anxiety state 06/23/2007  . Essential hypertension 06/23/2007  . ALLERGIC RHINITIS 06/23/2007  . GERD 06/23/2007  . Irritable bowel syndrome 06/23/2007  . POSTMENOPAUSAL STATUS 06/23/2007  . COLONIC POLYPS, HX OF 03/08/2002    Orientation RESPIRATION BLADDER Height & Weight     Self  Normal Incontinent Weight: 137 lb 2 oz (62.2 kg) Height:     BEHAVIORAL SYMPTOMS/MOOD NEUROLOGICAL BOWEL NUTRITION STATUS      Incontinent Diet (Heart Healthy)  AMBULATORY STATUS COMMUNICATION OF NEEDS Skin   Extensive Assist (Patient was unable to ambulate with physcial therapist during eval on 3/29.) Verbally PU Stage and Appropriate Care (Stage 1 pressure ulcer to mid sacrum)                       Personal Care Assistance Level of Assistance  Bathing, Feeding, Dressing Bathing Assistance: Maximum assistance Feeding assistance: Independent Dressing Assistance: Maximum assistance     Functional Limitations Info  Sight, Hearing, Speech Sight Info: Adequate Hearing Info: Adequate Speech Info: Adequate    SPECIAL CARE FACTORS FREQUENCY  PT (By licensed PT), Speech therapy     PT Frequency: Evaluated 3/19. A minimum of 2X per week recommended.        Speech Therapy Frequency: Evaluated 12/24/15.  Thin liquids recommended      Contractures Contractures Info: Not present    Additional Factors Info  Code Status, Allergies Code Status Info: Full Allergies Info: Codeine Phosphate           Current Medications (12/25/2015):  This is  the current hospital active medication list Current Facility-Administered Medications  Medication Dose Route Frequency Provider Last Rate Last Dose  . acetaminophen (TYLENOL) tablet 650 mg  650 mg Oral Q6H PRN Samella Parr, NP       Or  . acetaminophen (TYLENOL) suppository 650 mg  650 mg Rectal Q6H PRN Samella Parr, NP      . apixaban Arne [place]) tablet 5 mg  5 mg  Oral BID Samuella Cota, MD      . ciprofloxacin (CIPRO) tablet 250 mg  250 mg Oral BID Samuella Cota, MD      . divalproex (DEPAKOTE SPRINKLE) capsule 250 mg  250 mg Oral TID Samella Parr, NP   250 mg at 12/25/15 0843  . escitalopram (LEXAPRO) tablet 5 mg  5 mg Oral Daily Samella Parr, NP   5 mg at 12/25/15 0840  . feeding supplement (BOOST / RESOURCE BREEZE) liquid 1 Container  1 Container Oral TID BM Waldemar Dickens, MD   1 Container at 12/25/15 [phone]  . ferrous sulfate tablet 325 mg  325 mg Oral TID PC Samella Parr, NP   325 mg at 12/25/15 0841  . folic acid (FOLVITE) tablet 1 mg  1 mg Oral Daily Samella Parr, NP   1 mg at 12/25/15 0841  . LORazepam (ATIVAN) tablet 0.5 mg  0.5 mg Oral Q6H PRN Samuella Cota, MD   0.5 mg at 12/24/15 1102  . LORazepam (ATIVAN) tablet 1 mg  1 mg Oral 3 times per day Samella Parr, NP   1 mg at 12/25/15 0504  . multivitamin with minerals tablet 1 tablet  1 tablet Oral Daily Samella Parr, NP   1 tablet at 12/25/15 [phone]  . QUEtiapine (SEROQUEL) tablet 25 mg  25 mg Oral BID Samella Parr, NP   25 mg at 12/25/15 0841  . rivastigmine (EXELON) capsule 1.5 mg  1.5 mg Oral BID Samella Parr, NP   1.5 mg at 12/25/15 0842  . sodium chloride flush (NS) 0.9 % injection 3 mL  3 mL Intravenous Q12H Samella Parr, NP   3 mL at 12/23/15 1030  . thiamine (VITAMIN B-1) tablet 100 mg  100 mg Oral Daily Samella Parr, NP   100 mg at 12/25/15 0841  . traMADol (ULTRAM) tablet 50 mg  50 mg Oral 3 times per day Samella Parr, NP   50 mg at 12/25/15 [id number]     Discharge Medications: Please see discharge summary for a list of discharge medications.  Relevant Imaging Results:  Relevant Lab Results:   Additional Information ss#[id number].   Sable Feil, LCSW

## 2015-12-25 NOTE — Progress Notes (Signed)
Nutrition Follow-up  DOCUMENTATION CODES:   Non-severe (moderate) malnutrition in context of chronic illness  INTERVENTION:  Discontinue Boost Breeze.  Provide Ensure Enlive po BID, each supplement provides 350 kcal and 20 grams of protein.  Encourage adequate PO intake.   NUTRITION DIAGNOSIS:   Malnutrition related to chronic illness as evidenced by moderate depletion of body fat, moderate depletions of muscle mass; ongoing  GOAL:   Patient will meet greater than or equal to 90% of their needs; progressing  MONITOR:   PO intake, Supplement acceptance, Weight trends, Labs, I & O's, Skin  REASON FOR ASSESSMENT:   Consult Assessment of nutrition requirement/status  ASSESSMENT:   70 y.o. female with a Past Medical History of IBS, dementia, ETOH abuse, chronic DVT, chronic R fem neck fracture who presents with SIRS from UTI/pyelo.  Diet has been advanced to a heart diet. Meal completion has been 40-60%. Pt currently has Boost Breeze and has been consuming them. RD to modify orders and order Ensure instead as diet has been advanced to aid in caloric and protein needs. Encourage adequate PO intake.   Labs: Low potassium, CO2, calcium, and GFR. High sodium (146), chloride, and creatinine. (Pt lost IV access)  Diet Order:  Diet Heart Room service appropriate?: Yes; Fluid consistency:: Thin  Skin:  Wound (see comment) (Stage I pressure ulcer on sacrum)  Last BM:  3/30  Height:   Ht Readings from Last 1 Encounters:  12/15/15 5\' 5"  (1.651 m)    Weight:   Wt Readings from Last 1 Encounters:  12/23/15 137 lb 2 oz (62.2 kg)    Ideal Body Weight:  56.8 kg  BMI:  Body mass index is 22.82 kg/(m^2).  Estimated Nutritional Needs:   Kcal:  1700-1850  Protein:  75-90 grams  Fluid:  1.7 - 1.8 L/day  EDUCATION NEEDS:   No education needs identified at this time  Corrin Parker, MS, RD, LDN Pager # [phone] After hours/ weekend pager # [phone]

## 2015-12-25 NOTE — Progress Notes (Addendum)
ADDENDUM Patient continues to pull out IVs and does not have IV access at this time. Pseudomonas in urine is pan sensitive. Spoke with Dr. Sarajane Jews, and ok to switch to PO ciprofloxacin.  Plan: -ciprofloxacin 250mg  PO BID x3 days for uncomplicated UTI dosing -pharmacy will follow peripherally. Please re-consult if needed.  Tara Shaffer, PharmD, BCPS Clinical Pharmacist Pager: [phone] 12/25/2015 11:25 AM   Pharmacy Antibiotic Note  Tara Shaffer is a 70 y.o. female admitted on 12/23/2015 with UTI.  Pharmacy has been consulted for cefepime dosing.  Plan: -cefepime 1g IV q24h  -Will follow for renal function improvement, sensitivity of urine culture, and LOT  Weight: 137 lb 2 oz (62.2 kg)  Temp (24hrs), Avg:98.8 F (37.1 C), Min:98.6 F (37 C), Max:99 F (37.2 C)   Recent Labs Lab 12/23/15 0645 12/23/15 0728 12/24/15 0614  WBC 9.7  --  6.6  CREATININE 1.72*  --  1.24*  LATICACIDVEN  --  1.55  --     Estimated Creatinine Clearance: 39.1 mL/min (by C-G formula based on Cr of 1.24).    Allergies  Allergen Reactions  . Codeine Phosphate Nausea Only    Antimicrobials this admission: Zosyn 3/28 x1 Vanc 3/28 x1 CTX 3/29>>3/30 Cefeime 3/30>>  Dose adjustments this admission: n/a  Microbiology results: 3/28 urine: >100K pseudomonas, sens pending 3/28 BCx: ngtd 3/28 MRSA PCR: neg 3/28 CDiff: neg  Thank you for allowing pharmacy to be a part of this patient's care.  Tara Shaffer 12/25/2015 9:15 AM

## 2015-12-25 NOTE — Progress Notes (Signed)
PROGRESS NOTE  JAMILA MIHALICK [id number] DOB: 1946/06/16 DOA: 12/23/2015 PCP: [name], MD  Summary: 70 year old woman resident of assisted living facility, history of alcohol associated dementia with behavioral disturbance, presented with lethargy, decreased responsiveness. Found to have fever. Admitted for UTI, confusion; consideration was given to early sepsis or SIRS. Past Medical History of IBS, dementia, ETOH abuse, chronic DVT, chronic R fem neck fracture who presents with SIRS from UTI/pyelo. Sepsis orders stopped including additional fluid boluses and Vanc/Zos. Starting CTX.   Assessment/Plan: 1. Pseudomonas UTI with fever, only one SIRS criterion on admission. Clinically improved. Influenza negative. C. difficile PCR negative. Blood cultures no growth to date and urine culture negative. No evidence of sepsis. 2. AKI on CKD stage II. AKI resolved 3. Acute encephalopathy superimposed on dementia, appears somewhat better today. Less restless. 4. Anemia of CKD, at baseline. 5. Chronic DVT, chronic right femoral fx. Continue Eliquis. 6. Dementia with behavioral disturbance, associated with history of alcohol use. Continue Seroquel, divalproex, Exelon.   Overall much improved.  Change to ciprofloxacin based on sensitivities.  Replace potassium  Physical therapy is recommended skilled nursing facility. Social work Social research officer, government. Anticipate discharge 3/31.  Code Status: full code DVT prophylaxis: Eliquis Family Communication: Unable to reach either son Delfino Lovett or Runner, broadcasting/film/video by telephone yesterday Disposition Plan: return to ALF  Murray Hodgkins, MD  [facility] Direct contact:  --Via amion app OR  --[email or web address]; password [email or web address] and click  [phone] contact night coverage as above 12/25/2015, 8:59 AM  LOS: 2 days   Consultants:    Procedures:    Antibiotics:  Ceftriaxone 3/29   Cipro 3/30 >>  HPI/Subjective: No complaints.  Objective: Filed Vitals:   12/24/15 0835 12/24/15 1615 12/24/15 2110 12/25/15 0511  BP: 123/50 112/60 111/58 121/58  Pulse: 81 75 64 67  Temp: 97.8 F (36.6 C) 98.6 F (37 C) 99 F (37.2 C) 98.7 F (37.1 C)  TempSrc: Oral Oral Axillary Oral  Resp: 18 16 17 18   Weight:      SpO2: 92% 96% 91% 94%    Intake/Output Summary (Last 24 hours) at 12/25/15 0859 Last data filed at 12/25/15 0600  Gross per 24 hour  Intake    300 ml  Output      0 ml  Net    300 ml     Filed Weights   12/23/15 1019 12/23/15 2024  Weight: 62.2 kg (137 lb 2 oz) 62.2 kg (137 lb 2 oz)    Exam:    General:  Appears comfortable, calm, sitting in chair. Cardiovascular: Regular rate and rhythm, no murmur, rub or gallop. No lower extremity edema. Respiratory: Clear to auscultation bilaterally, no wheezes, rales or rhonchi. Normal respiratory effort. Abdomen: soft, ntnd Psychiatric: Appears confused. Follows simple commands.  New data reviewed:  Urine culture Pseudomonas, pansensitive  Scheduled Meds: . apixaban  2.5 mg Oral BID  . cefTRIAXone (ROCEPHIN)  IV  1 g Intravenous Q24H  . divalproex  250 mg Oral TID  . escitalopram  5 mg Oral Daily  . feeding supplement  1 Container Oral TID BM  . ferrous sulfate  325 mg Oral TID PC  . folic acid  1 mg Oral Daily  . LORazepam  1 mg Oral 3 times per day  . multivitamin with minerals  1 tablet Oral Daily  . QUEtiapine  25 mg Oral BID  . rivastigmine  1.5 mg Oral BID  . sodium chloride flush  3 mL Intravenous Q12H  . thiamine  100  mg Oral Daily  . traMADol  50 mg Oral 3 times per day   Continuous Infusions:    Principal Problem:   UTI (urinary tract infection) Active Problems:   Dementia with behavioral disturbance   Recurrent falls   Malnutrition of moderate degree (HCC)   DVT, femoral, chronic (HCC)   Right leg swelling   Mood disorder (HCC)   Anemia of chronic disease   CKD (chronic kidney disease) stage 2, GFR 60-89 ml/min   AKI (acute kidney injury) ([place])   Fracture  of femoral neck, right (HCC)   SIRS (systemic inflammatory response syndrome) (HCC)   Time spent 20 minutes

## 2015-12-26 DIAGNOSIS — N3000 Acute cystitis without hematuria: Secondary | ICD-10-CM

## 2015-12-26 MED ORDER — CIPROFLOXACIN HCL 250 MG PO TABS: 250.0000 mg | ORAL_TABLET | Freq: Two times a day (BID) | ORAL | Status: DC

## 2015-12-26 MED ORDER — POTASSIUM CHLORIDE CRYS ER 20 MEQ PO TBCR
40.0000 meq | EXTENDED_RELEASE_TABLET | Freq: Two times a day (BID) | ORAL | Status: DC
  Administered 2015-12-26: 40 meq via ORAL
  Filled 2015-12-26: qty 2

## 2015-12-26 MED ORDER — TRAMADOL HCL 50 MG PO TABS: 50.0000 mg | ORAL_TABLET | Freq: Three times a day (TID) | ORAL | Status: DC

## 2015-12-26 MED ORDER — LORAZEPAM 1 MG PO TABS: 1.0000 mg | ORAL_TABLET | Freq: Three times a day (TID) | ORAL | Status: DC

## 2015-12-26 MED ORDER — POTASSIUM CHLORIDE CRYS ER 20 MEQ PO TBCR: 40.0000 meq | EXTENDED_RELEASE_TABLET | Freq: Two times a day (BID) | ORAL | Status: DC

## 2015-12-26 NOTE — Clinical Social Work Note (Signed)
Patient medically stable for discharge per MD and will transfer to [facility] for rehab and continued skilled nursing care. Patient's son contacted regarding facility choice and was informed that patient ready for discharge today. Ms. Snow will be transported to facility by ambulance.  Creedence Heiss Givens, MSW, LCSW Licensed Clinical Social Worker [place] [phone]

## 2015-12-26 NOTE — Care Management Important Message (Signed)
Important Message  Patient Details  Name: Tara Shaffer MRN: [id number] Date of Birth: 1946/06/05   Medicare Important Message Given:  Yes    Barb Merino [place] 12/26/2015, 12:22 PM

## 2015-12-26 NOTE — Discharge Summary (Signed)
Physician Discharge Summary  Tara Shaffer [id number] DOB: 05/29/1946 DOA: 12/23/2015  PCP: [name], MD  Admit date: 12/23/2015 Discharge date: 12/26/2015  Time spent: 35 minutes  Recommendations for Outpatient Follow-up:   Complete ciprofloxacin on/2/17 for pseudomonas pyelonephritis  Recommend close follow-up in terms of getting a complete metabolic panel and as well as a CBC in about one week as patient had some hypokalemia this admission  Recommend goals of care discussion to be initiated as an outpatient at skilled facility  Recommend close monitoring and MMSE as an outpatient for determination of dementia  Discharge Diagnoses:  Principal Problem:   UTI (urinary tract infection) Active Problems:   Dementia with behavioral disturbance   Recurrent falls   Malnutrition of moderate degree (HCC)   DVT, femoral, chronic (HCC)   Right leg swelling   Mood disorder (HCC)   Anemia of chronic disease   CKD (chronic kidney disease) stage 2, GFR 60-89 ml/min   AKI (acute kidney injury) ([place])   Fracture of femoral neck, right (HCC)   SIRS (systemic inflammatory response syndrome) ([place])   Discharge Condition: gaurded  Diet recommendation: regular diet  Filed Weights   12/23/15 1019 12/23/15 2024  Weight: 62.2 kg (137 lb 2 oz) 62.2 kg (137 lb 2 oz)    History of present illness:  70 year old woman resident of assisted living facility, history of alcohol associated dementia with behavioral disturbance, presented with lethargy, decreased responsiveness. Found to have fever. Admitted for UTI, confusion; consideration was given to early sepsis or SIRS. Past Medical History of IBS, dementia, ETOH abuse, chronic DVT, chronic R fem neck fracture who presents with SIRS from UTI/pyelo. Sepsis orders stopped including additional fluid boluses and Vanc/Zos.   Hospital Course:   1. Pseudomonas UTI with fever, only one SIRS criterion on admission. Clinically improved. Influenza  negative. C. difficile PCR negative. Blood cultures no growth to date and urine culture negative. No evidence of sepsis. Complete course of ciprofloxacin 250/2/17 2. AKI on CKD stage II. AKI resolved 3. Hypokalemia-K2.9 on discharge-replace with K Dur 40 twice a day-would suggest repetition of labs in 2-3 days. 4. Acute encephalopathy superimposed on dementia, at baseline on discharge-continue Exelon patch as outpatient 5. Anemia of CKD, at baseline. 6. Chronic DVT, chronic right femoral fx. Continue Eliquis-consideration to be given as outpatient regarding fall risk and risk of extension of clot by nursing home physician 7. Dementia with behavioral disturbance, associated with history of alcohol use. Continue Seroquel, divalproex, Exelon.   Overall much improved.  Change to ciprofloxacin based on sensitivities.  Replace potassium   Discharge Exam: Filed Vitals:   12/26/15 0600 12/26/15 0937  BP: 114/50 133/62  Pulse: 68 88  Temp: 97.8 F (36.6 C) 98 F (36.7 C)  Resp: 19 20    Alert sitting up in chair Some mild confusion but otherwsie has stabilized Eating some  Discharge Instructions    Current Discharge Medication List    CONTINUE these medications which have NOT CHANGED   Details  amLODipine (NORVASC) 5 MG tablet Take 1 tablet (5 mg total) by mouth daily.    apixaban (ELIQUIS) 5 MG TABS tablet Take 5 mg by mouth 2 (two) times daily.     divalproex (DEPAKOTE SPRINKLE) 125 MG capsule Take 250 mg by mouth 3 (three) times daily.     escitalopram (LEXAPRO) 5 MG tablet Take 5 mg by mouth daily.    ferrous sulfate 325 (65 FE) MG tablet Take 1 tablet (325 mg total) by mouth  3 (three) times daily after meals.    folic acid (FOLVITE) 1 MG tablet Take 1 tablet (1 mg total) by mouth daily.    LORazepam (ATIVAN) 0.5 MG tablet Take 1 mg by mouth every 8 (eight) hours.    Multiple Vitamin (MULTIVITAMIN WITH MINERALS) TABS tablet Take 1 tablet by mouth daily.     QUEtiapine (SEROQUEL) 50 MG tablet Take 25-50 mg by mouth 2 (two) times daily. Take 0.5 tablet (25 mg) in the am and Take 1 tablet (50 mg) in the evening.    rivastigmine (EXELON) 1.5 MG capsule Take 1.5 mg by mouth 2 (two) times daily.    thiamine 100 MG tablet Take 1 tablet (100 mg total) by mouth daily.    traMADol (ULTRAM) 50 MG tablet Take 50 mg by mouth every 8 (eight) hours.       Allergies  Allergen Reactions  . Codeine Phosphate Nausea Only      The results of significant diagnostics from this hospitalization (including imaging, microbiology, ancillary and laboratory) are listed below for reference.    Significant Diagnostic Studies: Dg Chest 2 View  12/24/2015  CLINICAL DATA:  Code sepsis.  Confusion. EXAM: CHEST  2 VIEW COMPARISON:  11/23/2015 FINDINGS: The heart size is moderately enlarged. Aortic tortuosity is identified. Lung volumes are low. There may be an opacity in the left lung base. No pleural effusion or edema. IMPRESSION: 1. Lungs are suboptimally inflated. Cannot rule out left lung base opacity. Recommend repeat imaging with full inspiration as patient's clinical condition tolerates. Electronically Signed   By: Kerby Moors M.D.   On: 12/24/2015 17:07   Ct Head Wo Contrast  12/14/2015  CLINICAL DATA:  Unwitnessed fall, fell 5 times in the past week. EXAM: CT HEAD WITHOUT CONTRAST CT CERVICAL SPINE WITHOUT CONTRAST TECHNIQUE: Multidetector CT imaging of the head and cervical spine was performed following the standard protocol without intravenous contrast. Multiplanar CT image reconstructions of the cervical spine were also generated. COMPARISON:  11/24/2015 FINDINGS: CT HEAD FINDINGS There is no intracranial hemorrhage, mass or evidence of acute infarction. There is moderate generalized atrophy. There is moderate chronic microvascular ischemic change. There is no significant extra-axial fluid collection. No interval change from 11/24/2015. No acute intracranial  findings are evident. Calvarium and skullbase are intact. Visible paranasal sinuses are clear. CT CERVICAL SPINE FINDINGS The vertebral column, pedicles and facet articulations are intact. There is no evidence of acute fracture. No acute soft tissue abnormalities are evident. Moderate degenerative cervical disc disease is present from C3 through C6. Mild curvature reversal at C4. IMPRESSION: 1. Negative for acute intracranial traumatic injury. There is moderate generalized atrophy and chronic microvascular ischemic change. 2. Negative for acute cervical spine fracture. Electronically Signed   By: Andreas Newport M.D.   On: 12/14/2015 21:41   Ct Cervical Spine Wo Contrast  12/14/2015  CLINICAL DATA:  Unwitnessed fall, fell 5 times in the past week. EXAM: CT HEAD WITHOUT CONTRAST CT CERVICAL SPINE WITHOUT CONTRAST TECHNIQUE: Multidetector CT imaging of the head and cervical spine was performed following the standard protocol without intravenous contrast. Multiplanar CT image reconstructions of the cervical spine were also generated. COMPARISON:  11/24/2015 FINDINGS: CT HEAD FINDINGS There is no intracranial hemorrhage, mass or evidence of acute infarction. There is moderate generalized atrophy. There is moderate chronic microvascular ischemic change. There is no significant extra-axial fluid collection. No interval change from 11/24/2015. No acute intracranial findings are evident. Calvarium and skullbase are intact. Visible paranasal sinuses are clear.  CT CERVICAL SPINE FINDINGS The vertebral column, pedicles and facet articulations are intact. There is no evidence of acute fracture. No acute soft tissue abnormalities are evident. Moderate degenerative cervical disc disease is present from C3 through C6. Mild curvature reversal at C4. IMPRESSION: 1. Negative for acute intracranial traumatic injury. There is moderate generalized atrophy and chronic microvascular ischemic change. 2. Negative for acute cervical  spine fracture. Electronically Signed   By: Andreas Newport M.D.   On: 12/14/2015 21:41    Microbiology: Recent Results (from the past 240 hour(s))  Culture, blood (routine x 2)     Status: None (Preliminary result)   Collection Time: 12/23/15  6:45 AM  Result Value Ref Range Status   Specimen Description BLOOD RIGHT ARM  Final   Special Requests BOTTLES DRAWN AEROBIC AND ANAEROBIC 5ML  Final   Culture NO GROWTH 2 DAYS  Final   Report Status PENDING  Incomplete  Culture, blood (routine x 2)     Status: None (Preliminary result)   Collection Time: 12/23/15  6:55 AM  Result Value Ref Range Status   Specimen Description BLOOD LEFT ARM  Final   Special Requests BOTTLES DRAWN AEROBIC AND ANAEROBIC 5ML  Final   Culture NO GROWTH 2 DAYS  Final   Report Status PENDING  Incomplete  Urine culture     Status: None   Collection Time: 12/23/15  7:15 AM  Result Value Ref Range Status   Specimen Description URINE, CATHETERIZED  Final   Special Requests NONE  Final   Culture >=100,000 COLONIES/mL PSEUDOMONAS AERUGINOSA  Final   Report Status 12/25/2015 FINAL  Final   Organism ID, Bacteria PSEUDOMONAS AERUGINOSA  Final      Susceptibility   Pseudomonas aeruginosa - MIC*    CEFTAZIDIME 2 SENSITIVE Sensitive     CIPROFLOXACIN <=0.25 SENSITIVE Sensitive     GENTAMICIN 4 SENSITIVE Sensitive     IMIPENEM 1 SENSITIVE Sensitive     PIP/TAZO <=4 SENSITIVE Sensitive     CEFEPIME 4 SENSITIVE Sensitive     * >=100,000 COLONIES/mL PSEUDOMONAS AERUGINOSA  C difficile quick scan w PCR reflex     Status: None   Collection Time: 12/23/15  9:30 AM  Result Value Ref Range Status   C Diff antigen NEGATIVE NEGATIVE Final   C Diff toxin NEGATIVE NEGATIVE Final   C Diff interpretation Negative for toxigenic C. difficile  Final  MRSA PCR Screening     Status: None   Collection Time: 12/23/15 10:28 AM  Result Value Ref Range Status   MRSA by PCR NEGATIVE NEGATIVE Final    Comment:        The GeneXpert MRSA  Assay (FDA approved for NASAL specimens only), is one component of a comprehensive MRSA colonization surveillance program. It is not intended to diagnose MRSA infection nor to guide or monitor treatment for MRSA infections.      Labs: Basic Metabolic Panel:  Recent Labs Lab 12/23/15 0645 12/24/15 0614  NA 141 146*  K 3.9 2.9*  CL 107 116*  CO2 21* 21*  GLUCOSE 90 92  BUN 27* 19  CREATININE 1.72* 1.24*  CALCIUM 8.6* 7.8*   Liver Function Tests:  Recent Labs Lab 12/23/15 0645  AST 19  ALT 10*  ALKPHOS 79  BILITOT 0.8  PROT 7.0  ALBUMIN 2.9*   No results for input(s): LIPASE, AMYLASE in the last 168 hours. No results for input(s): AMMONIA in the last 168 hours. CBC:  Recent Labs Lab 12/23/15 0645  12/24/15 0614  WBC 9.7 6.6  NEUTROABS 7.3  --   HGB 10.2* 8.2*  HCT 32.8* 26.1*  MCV 88.6 89.1  PLT 263 180   Cardiac Enzymes: No results for input(s): CKTOTAL, CKMB, CKMBINDEX, TROPONINI in the last 168 hours. BNP: BNP (last 3 results) No results for input(s): BNP in the last 8760 hours.  ProBNP (last 3 results) No results for input(s): PROBNP in the last 8760 hours.  CBG: No results for input(s): GLUCAP in the last 168 hours.     SignedNita Sells MD   [facility] 12/26/2015, 10:19 AM

## 2015-12-26 NOTE — Clinical Social Work Note (Signed)
Clinical Social Work Assessment  Patient Details  Name: Tara Shaffer MRN: [id number] Date of Birth: 07/27/1946  Date of referral:  12/25/15               Reason for consult:  Facility Placement                Permission sought to share information with:  Other (CSW talked with patient's son Chyna Hoeg [phone] as patient only oriented to self) Permission granted to share information::  No (Patient only oriented to self)  Name::     Tara Shaffer  Agency::     Relationship::  Son  Contact Information:  [phone]  Housing/Transportation Living arrangements for the past 2 months:  [place] ([facility]) Source of Information:  Adult Children Patient Interpreter Needed:  None Criminal Activity/Legal Involvement Pertinent to Current Situation/Hospitalization:  No - Comment as needed Significant Relationships:  Adult Children Lives with:  Facility Resident ([facility]) Do you feel safe going back to the place where you live?  No (Son understands that patient needs rehab prior to returning to ALF) Need for family participation in patient care:  Yes (Comment)  Care giving concerns:  None expressed by patient's son Tara Shaffer.   Social Worker assessment / plan:  CSW talked with patient's son Tara Shaffer [phone]) by phone regarding discharge planning and recommendation of ST rehab. Mr. Shaker requested [facility] as patient has been there before and then discharged to [facility] and his father is currently a resident at this facility.   Employment status:  Retired Health visitor, Managed Care PT Recommendations:  [place] / Referral to community resources:  [place] (List not provided as spoke with son and phone only)  Patient/Family's Response to care:  Son expressed no concern regarding patient's care while  hospitalized.  Patient/Family's Understanding of and Emotional Response to Diagnosis, Current Treatment, and Prognosis:  Not discussed.  Emotional Assessment Appearance:  Appears older than stated age Attitude/Demeanor/Rapport:  Other (confused) Affect (typically observed):  Appropriate Orientation:  Oriented to Self Alcohol / Substance use:  Tobacco Use (Patient does not smoke, reported on last admission in 2016 that she drank 25.2 oz of  alcohol per week and does not use illicit drugs.) Psych involvement (Current and /or in the community):  No (Comment)  Discharge Needs  Concerns to be addressed:  No discharge needs identified Readmission within the last 30 days:  No Current discharge risk:  None Barriers to Discharge:  No Barriers Identified   Sable Feil, LCSW 12/26/2015, 11:47 AM

## 2015-12-26 NOTE — Discharge Instructions (Signed)

## 2015-12-28 LAB — CULTURE, BLOOD (ROUTINE X 2)
Culture: NO GROWTH
Culture: NO GROWTH

## 2016-03-25 ENCOUNTER — Encounter (HOSPITAL_COMMUNITY): Payer: Self-pay

## 2016-03-25 ENCOUNTER — Emergency Department (HOSPITAL_COMMUNITY)
Admission: EM | Admit: 2016-03-25 | Discharge: 2016-03-25 | Disposition: A | Discharge status: Home / Self Care | Payer: Medicare Other | Attending: Emergency Medicine | Admitting: Emergency Medicine

## 2016-03-25 DIAGNOSIS — M25532 Pain in left wrist: Secondary | ICD-10-CM | POA: Insufficient documentation

## 2016-03-25 DIAGNOSIS — Z86010 Personal history of colonic polyps: Secondary | ICD-10-CM | POA: Insufficient documentation

## 2016-03-25 DIAGNOSIS — R609 Edema, unspecified: Secondary | ICD-10-CM | POA: Insufficient documentation

## 2016-03-25 DIAGNOSIS — F329 Major depressive disorder, single episode, unspecified: Secondary | ICD-10-CM | POA: Insufficient documentation

## 2016-03-25 DIAGNOSIS — Z85828 Personal history of other malignant neoplasm of skin: Secondary | ICD-10-CM | POA: Insufficient documentation

## 2016-03-25 DIAGNOSIS — Y939 Activity, unspecified: Secondary | ICD-10-CM | POA: Insufficient documentation

## 2016-03-25 DIAGNOSIS — Z79899 Other long term (current) drug therapy: Secondary | ICD-10-CM | POA: Insufficient documentation

## 2016-03-25 DIAGNOSIS — I10 Essential (primary) hypertension: Secondary | ICD-10-CM | POA: Diagnosis not present

## 2016-03-25 DIAGNOSIS — Y92129 Unspecified place in nursing home as the place of occurrence of the external cause: Secondary | ICD-10-CM | POA: Diagnosis not present

## 2016-03-25 DIAGNOSIS — W19XXXA Unspecified fall, initial encounter: Secondary | ICD-10-CM

## 2016-03-25 DIAGNOSIS — Z8719 Personal history of other diseases of the digestive system: Secondary | ICD-10-CM | POA: Insufficient documentation

## 2016-03-25 DIAGNOSIS — Y999 Unspecified external cause status: Secondary | ICD-10-CM | POA: Diagnosis not present

## 2016-03-25 DIAGNOSIS — M5030 Other cervical disc degeneration, unspecified cervical region: Secondary | ICD-10-CM | POA: Diagnosis not present

## 2016-03-25 DIAGNOSIS — M25552 Pain in left hip: Secondary | ICD-10-CM | POA: Diagnosis present

## 2016-03-25 DIAGNOSIS — E785 Hyperlipidemia, unspecified: Secondary | ICD-10-CM | POA: Insufficient documentation

## 2016-03-25 DIAGNOSIS — W06XXXA Fall from bed, initial encounter: Secondary | ICD-10-CM | POA: Diagnosis not present

## 2016-03-25 DIAGNOSIS — Z792 Long term (current) use of antibiotics: Secondary | ICD-10-CM | POA: Insufficient documentation

## 2016-03-25 HISTORY — DX: Unspecified dementia, unspecified severity, without behavioral disturbance, psychotic disturbance, mood disturbance, and anxiety: F03.90

## 2016-03-25 NOTE — ED Notes (Signed)
Pt BIB EMS from [facility] c/o of fall from bed onto padded floor. Initially complaining of hip and wrist pain, currently no pain reported. Family requested that she be assessed. Cleared C-spine with EMS. Hx of dementia. Denies LOC or hitting head.

## 2016-03-25 NOTE — ED Provider Notes (Signed)
CSN: [id number]     Arrival date & time 03/25/16  0157 History   First MD Initiated Contact with Patient 03/25/16 0327     Chief Complaint  Patient presents with  . Fall     (Consider location/radiation/quality/duration/timing/severity/associated sxs/prior Treatment) HPI  This is a 70 year old female with a history of dementia. She was sent from her nursing home after falling from bed onto a padded floor. She was initially complaining of left hip and wrist pain but these appear to have resolved. She is anxious to go back home so that she can drink wine.   Past Medical History  Diagnosis Date  . MELANOMA, HX OF 08/04/2007  . ALLERGIC RHINITIS 06/23/2007  . ANXIETY DEPRESSION 06/12/2009  . ANXIETY 06/23/2007  . 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 GERD 06/23/2007  . HYPERLIPIDEMIA 06/24/2007  . HYPERTENSION 06/23/2007  . COLONIC POLYPS, HX OF 03/08/2002  . DIVERTICULOSIS, COLON 08/04/2007  . Irritable bowel syndrome 06/23/2007  . Memory dysfunction 06/23/2011  . Cancer ([place])     skin cx/ hx melanoma  . LOW BACK PAIN 06/24/2007  . Alcohol abuse 02/27/2012   Past Surgical History  Procedure Laterality Date  . Cesarean section      x 2  . Colonoscopy    . Appendectomy     Family History  Problem Relation Age of Onset  . Hypertension Other   . Diabetes Other   . Colon cancer Cousin    Social History  Substance Use Topics  . Smoking status: Never Smoker   . Smokeless tobacco: Never Used  . Alcohol Use: 25.2 oz/week    42 Glasses of wine per week   OB History    No data available     Review of Systems    Allergies  Codeine phosphate  Home Medications   Prior to Admission medications   Medication Sig Start Date End Date Taking? Authorizing Provider  amLODipine (NORVASC) 5 MG tablet Take 1 tablet (5 mg total) by mouth daily. 03/17/15  Yes [name], MD  apixaban (ELIQUIS) 5 MG TABS tablet Take 5 mg by mouth 2 (two) times daily.    Yes Historical  Provider, MD  divalproex (DEPAKOTE SPRINKLE) 125 MG capsule Take 250 mg by mouth 3 (three) times daily.    Yes Historical Provider, MD  donepezil (ARICEPT) 5 MG tablet Take 5 mg by mouth at bedtime.   Yes Historical Provider, MD  escitalopram (LEXAPRO) 5 MG tablet Take 10 mg by mouth daily.    Yes Historical Provider, MD  ferrous sulfate 325 (65 FE) MG tablet Take 1 tablet (325 mg total) by mouth 3 (three) times daily after meals. 03/17/15  Yes [name], MD  folic acid (FOLVITE) 1 MG tablet Take 1 tablet (1 mg total) by mouth daily. 03/17/15  Yes [name], MD  LORazepam (ATIVAN) 0.5 MG tablet Take 1 mg by mouth every 8 (eight) hours.   Yes Historical Provider, MD  memantine (NAMENDA XR) 14 MG CP24 24 hr capsule Take 14 mg by mouth daily.   Yes Historical Provider, MD  Multiple Vitamin (MULTIVITAMIN WITH MINERALS) TABS tablet Take 1 tablet by mouth daily. 03/17/15  Yes [name], MD  thiamine 100 MG tablet Take 1 tablet (100 mg total) by mouth daily. 03/17/15  Yes [name], MD  traMADol (ULTRAM) 50 MG tablet Take 1 tablet (50 mg total) by mouth every 8 (eight) hours. 12/26/15  Yes Nita Sells, MD  ciprofloxacin (CIPRO) 250 MG tablet Take 1 tablet (250 mg total) by mouth 2 (two) times daily. Patient not taking: Reported on 03/25/2016 12/26/15   Nita Sells, MD  LORazepam (ATIVAN) 1 MG tablet Take 1 tablet (1 mg total) by mouth every 8 (eight) hours. Patient not taking: Reported on 03/25/2016 12/26/15   Nita Sells, MD  potassium chloride SA (K-DUR,KLOR-CON) 20 MEQ tablet Take 2 tablets (40 mEq total) by mouth 2 (two) times daily. Patient not taking: Reported on 03/25/2016 12/26/15   Nita Sells, MD   BP 140/63 mmHg  Pulse 71  Temp(Src) 97.6 F (36.4 C) (Oral)  Resp 18  SpO2 98%   Physical Exam  General: Well-developed, well-nourished female in no acute distress; appearance consistent with age of record HENT: normocephalic;  atraumatic Eyes: pupils equal, round and reactive to light; extraocular muscles intact Neck: supple; no C-spine tenderness Heart: regular rate and rhythm Lungs: clear to auscultation bilaterally Abdomen: soft; nondistended; nontender; no masses or hepatosplenomegaly; bowel sounds hyperactive Extremities: Arthritic changes; nontender; 2+ pitting edema of lower legs  Neurologic: Awake, alert and oriented x 2; motor function intact in all extremities and symmetric; no facial droop Skin: Warm and dry Psychiatric: Flat affect    ED Course  Procedures (including critical care time)   MDM  No evidence of significant injury on examination. Patient has a history of multiple visits for falls.    Shanon Rosser, MD 03/25/16 [phone]

## 2016-03-25 NOTE — ED Notes (Signed)
[facility] called  

## 2016-03-25 NOTE — ED Notes (Signed)
[facility] arrived.  

## 2016-03-29 NOTE — ED Notes (Signed)
Pt comes to ed via ems, c/o is verbalized had fall from her chair, denies pain, has dementia. Pt comes from [place], pt staff said that she threw her self out of her chair saying she wants to hurt herself.  So facility called ems twice. Pt refused to leave for hospital. Power of attorney. Brad chassion at [address]. Starling Manns [phone]  V/s on arrival ;  bp 150/80, pulse 72, rr18,  Hx of anxiety.

## 2016-03-30 ENCOUNTER — Encounter (HOSPITAL_COMMUNITY): Payer: Self-pay | Admitting: Emergency Medicine

## 2016-03-30 ENCOUNTER — Emergency Department (HOSPITAL_COMMUNITY)
Admission: EM | Admit: 2016-03-30 | Discharge: 2016-03-30 | Disposition: A | Discharge status: Home / Self Care | Payer: Medicare Other | Attending: Emergency Medicine | Admitting: Emergency Medicine

## 2016-03-30 DIAGNOSIS — I10 Essential (primary) hypertension: Secondary | ICD-10-CM | POA: Insufficient documentation

## 2016-03-30 DIAGNOSIS — Y9389 Activity, other specified: Secondary | ICD-10-CM | POA: Insufficient documentation

## 2016-03-30 DIAGNOSIS — R296 Repeated falls: Secondary | ICD-10-CM | POA: Insufficient documentation

## 2016-03-30 DIAGNOSIS — Z85820 Personal history of malignant melanoma of skin: Secondary | ICD-10-CM | POA: Insufficient documentation

## 2016-03-30 DIAGNOSIS — Z79899 Other long term (current) drug therapy: Secondary | ICD-10-CM | POA: Diagnosis not present

## 2016-03-30 DIAGNOSIS — Y999 Unspecified external cause status: Secondary | ICD-10-CM | POA: Insufficient documentation

## 2016-03-30 DIAGNOSIS — E785 Hyperlipidemia, unspecified: Secondary | ICD-10-CM | POA: Insufficient documentation

## 2016-03-30 DIAGNOSIS — W07XXXA Fall from chair, initial encounter: Secondary | ICD-10-CM | POA: Insufficient documentation

## 2016-03-30 DIAGNOSIS — Y9289 Other specified places as the place of occurrence of the external cause: Secondary | ICD-10-CM | POA: Insufficient documentation

## 2016-03-30 NOTE — ED Provider Notes (Signed)
CSN: [id number]     Arrival date & time 03/30/16  0005 History  By signing my name below, I, Eustaquio Maize, attest that this documentation has been prepared under the direction and in the presence of Orpah Greek, MD. Electronically Signed: Eustaquio Maize, ED Scribe. 03/30/2016. 1:09 AM.   Chief Complaint  Patient presents with  . Fall   The history is provided by the patient. No language interpreter was used.    HPI Comments: Tara Shaffer is a 70 y.o. female brought in by ambulance from [facility], who presents to the Emergency Department for evaluation s/p fall that occurred earlier tonight. Pt mentions that she slid off of the chair and landed onto her buttocks. No head injury or LOC. She has no pain or complaints at this time. Pt usually ambulates on her own but has been using a wheelchair recently. Denies buttock pain, back pain, leg pain, or any other associated symptoms.   Per triage report, pt was sent here from power of attorney for evaluation after the fall. Triage report states that staff at [facility] saw pt throw herself off of her chair stating that she wanted to hurt herself, prompting facility to call EMS. Pt denies falling on purpose. Denies SI.   Past Medical History  Diagnosis Date  . MELANOMA, HX OF 08/04/2007  . ALLERGIC RHINITIS 06/23/2007  . ANXIETY DEPRESSION 06/12/2009  . ANXIETY 06/23/2007  . 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 GERD 06/23/2007  . HYPERLIPIDEMIA 06/24/2007  . HYPERTENSION 06/23/2007  . COLONIC POLYPS, HX OF 03/08/2002  . DIVERTICULOSIS, COLON 08/04/2007  . Irritable bowel syndrome 06/23/2007  . Memory dysfunction 06/23/2011  . Cancer ([place])     skin cx/ hx melanoma  . LOW BACK PAIN 06/24/2007  . Alcohol abuse 02/27/2012  . Dementia    Past Surgical History  Procedure Laterality Date  . Cesarean section      x 2  . Colonoscopy    . Appendectomy     Family History  Problem Relation Age of Onset  .  Hypertension Other   . Diabetes Other   . Colon cancer Cousin    Social History  Substance Use Topics  . Smoking status: Never Smoker   . Smokeless tobacco: Never Used  . Alcohol Use: 25.2 oz/week    42 Glasses of wine per week   OB History    No data available     Review of Systems  Constitutional: Negative for fever.  Musculoskeletal: Negative for back pain and arthralgias.  Neurological: Negative for syncope.  Psychiatric/Behavioral: Negative for suicidal ideas.  All other systems reviewed and are negative.  Allergies  Codeine phosphate  Home Medications   Prior to Admission medications   Medication Sig Start Date End Date Taking? Authorizing Provider  amLODipine (NORVASC) 5 MG tablet Take 1 tablet (5 mg total) by mouth daily. 03/17/15  Yes [name], MD  apixaban (ELIQUIS) 5 MG TABS tablet Take 5 mg by mouth 2 (two) times daily.    Yes Historical Provider, MD  divalproex (DEPAKOTE SPRINKLE) 125 MG capsule Take 250 mg by mouth 3 (three) times daily.    Yes Historical Provider, MD  donepezil (ARICEPT) 5 MG tablet Take 5 mg by mouth at bedtime.   Yes Historical Provider, MD  escitalopram (LEXAPRO) 10 MG tablet Take 10 mg by mouth daily.   Yes Historical Provider, MD  ferrous sulfate 325 (65 FE) MG tablet Take 1 tablet (325 mg total)  by mouth 3 (three) times daily after meals. 03/17/15  Yes [name], MD  folic acid (FOLVITE) 1 MG tablet Take 1 tablet (1 mg total) by mouth daily. 03/17/15  Yes [name], MD  LORazepam (ATIVAN) 0.5 MG tablet Take 0.5 mg by mouth every 8 (eight) hours.   Yes Historical Provider, MD  memantine (NAMENDA XR) 14 MG CP24 24 hr capsule Take 14 mg by mouth daily.   Yes Historical Provider, MD  Multiple Vitamin (MULTIVITAMIN WITH MINERALS) TABS tablet Take 1 tablet by mouth daily. 03/17/15  Yes [name], MD  thiamine 100 MG tablet Take 1 tablet (100 mg total) by mouth daily. 03/17/15  Yes [name], MD  traMADol  (ULTRAM) 50 MG tablet Take 1 tablet (50 mg total) by mouth every 8 (eight) hours. 12/26/15  Yes Nita Sells, MD  ciprofloxacin (CIPRO) 250 MG tablet Take 1 tablet (250 mg total) by mouth 2 (two) times daily. Patient not taking: Reported on 03/25/2016 12/26/15   Nita Sells, MD  LORazepam (ATIVAN) 1 MG tablet Take 1 tablet (1 mg total) by mouth every 8 (eight) hours. Patient not taking: Reported on 03/25/2016 12/26/15   Nita Sells, MD  potassium chloride SA (K-DUR,KLOR-CON) 20 MEQ tablet Take 2 tablets (40 mEq total) by mouth 2 (two) times daily. Patient not taking: Reported on 03/25/2016 12/26/15   Nita Sells, MD   BP 142/92 mmHg  Pulse 74  Temp(Src) 98.6 F (37 C) (Oral)  Resp 20  SpO2 94%   Physical Exam  Constitutional: She is oriented to person, place, and time. She appears well-developed and well-nourished. No distress.  HENT:  Head: Normocephalic and atraumatic.  Right Ear: Hearing normal.  Left Ear: Hearing normal.  Nose: Nose normal.  Mouth/Throat: Oropharynx is clear and moist and mucous membranes are normal.  Eyes: Conjunctivae and EOM are normal. Pupils are equal, round, and reactive to light.  Neck: Normal range of motion. Neck supple.  Cardiovascular: Regular rhythm, S1 normal and S2 normal.  Exam reveals no gallop and no friction rub.   No murmur heard. Pulmonary/Chest: Effort normal and breath sounds normal. No respiratory distress. She exhibits no tenderness.  Abdominal: Soft. Normal appearance and bowel sounds are normal. There is no hepatosplenomegaly. There is no tenderness. There is no rebound, no guarding, no tenderness at McBurney's point and negative Murphy's sign. No hernia.  Musculoskeletal: Normal range of motion.  Neurological: She is alert and oriented to person, place, and time. She has normal strength. No cranial nerve deficit or sensory deficit. Coordination normal. GCS eye subscore is 4. GCS verbal subscore is 5. GCS motor  subscore is 6.  Skin: Skin is warm, dry and intact. No rash noted. No cyanosis.  Psychiatric: She has a normal mood and affect. Her speech is normal and behavior is normal. Thought content normal.  Confusion, at baseline.   Nursing note and vitals reviewed.   ED Course  Procedures (including critical care time)  DIAGNOSTIC STUDIES: Oxygen Saturation is 96% on RA, normal by my interpretation.    COORDINATION OF CARE: 1:08 AM-Discussed treatment plan with pt at bedside and pt agreed to plan.   Labs Review Labs Reviewed - No data to display  Imaging Review No results found. I have personally reviewed and evaluated these images and lab results as part of my medical decision-making.   EKG Interpretation None      MDM   Final diagnoses:  Frequent falls    Presents to  the ER for evaluation of a fall. Patient reports that she slid off the edge of a chair and landed on a seated position. She denies any pain at arrival. Thorough examination did not reveal any evidence of trauma. Patient did not require any imaging and was returned to [facility].  I personally performed the services described in this documentation, which was scribed in my presence. The recorded information has been reviewed and is accurate.      Orpah Greek, MD 03/30/16 [phone]

## 2016-03-30 NOTE — Discharge Instructions (Signed)
Fall Prevention in the Home  Falls can cause injuries and can affect people from all age groups. There are many simple things that you can do to make your home safe and to help prevent falls. WHAT CAN I DO ON THE OUTSIDE OF MY HOME?  Regularly repair the edges of walkways and driveways and fix any cracks.  Remove high doorway thresholds.  Trim any shrubbery on the main path into your home.  Use bright outdoor lighting.  Clear walkways of debris and clutter, including tools and rocks.  Regularly check that handrails are securely fastened and in good repair. Both sides of any steps should have handrails.  Install guardrails along the edges of any raised decks or porches.  Have leaves, snow, and ice cleared regularly.  Use sand or salt on walkways during winter months.  In the garage, clean up any spills right away, including grease or oil spills. WHAT CAN I DO IN THE BATHROOM?  Use night lights.  Install grab bars by the toilet and in the tub and shower. Do not use towel bars as grab bars.  Use non-skid mats or decals on the floor of the tub or shower.  If you need to sit down while you are in the shower, use a plastic, non-slip stool..  Keep the floor dry. Immediately clean up any water that spills on the floor.  Remove soap buildup in the tub or shower on a regular basis.  Attach bath mats securely with double-sided non-slip rug tape.  Remove throw rugs and other tripping hazards from the floor. WHAT CAN I DO IN THE BEDROOM?  Use night lights.  Make sure that a bedside light is easy to reach.  Do not use oversized bedding that drapes onto the floor.  Have a firm chair that has side arms to use for getting dressed.  Remove throw rugs and other tripping hazards from the floor. WHAT CAN I DO IN THE KITCHEN?   Clean up any spills right away.  Avoid walking on wet floors.  Place frequently used items in easy-to-reach places.  If you need to reach for something  above you, use a sturdy step stool that has a grab bar.  Keep electrical cables out of the way.  Do not use floor polish or wax that makes floors slippery. If you have to use wax, make sure that it is non-skid floor wax.  Remove throw rugs and other tripping hazards from the floor. WHAT CAN I DO IN THE STAIRWAYS?  Do not leave any items on the stairs.  Make sure that there are handrails on both sides of the stairs. Fix handrails that are broken or loose. Make sure that handrails are as long as the stairways.  Check any carpeting to make sure that it is firmly attached to the stairs. Fix any carpet that is loose or worn.  Avoid having throw rugs at the top or bottom of stairways, or secure the rugs with carpet tape to prevent them from moving.  Make sure that you have a light switch at the top of the stairs and the bottom of the stairs. If you do not have them, have them installed. WHAT ARE SOME OTHER FALL PREVENTION TIPS?  Wear closed-toe shoes that fit well and support your feet. Wear shoes that have rubber soles or low heels.  When you use a stepladder, make sure that it is completely opened and that the sides are firmly locked. Have someone hold the ladder while you   are using it. Do not climb a closed stepladder.  Add color or contrast paint or tape to grab bars and handrails in your home. Place contrasting color strips on the first and last steps.  Use mobility aids as needed, such as canes, walkers, scooters, and crutches.  Turn on lights if it is dark. Replace any light bulbs that burn out.  Set up furniture so that there are clear paths. Keep the furniture in the same spot.  Fix any uneven floor surfaces.  Choose a carpet design that does not hide the edge of steps of a stairway.  Be aware of any and all pets.  Review your medicines with your healthcare provider. Some medicines can cause dizziness or changes in blood pressure, which increase your risk of falling. Talk  with your health care provider about other ways that you can decrease your risk of falls. This may include working with a physical therapist or trainer to improve your strength, balance, and endurance.   This information is not intended to replace advice given to you by your health care provider. Make sure you discuss any questions you have with your health care provider.   Document Released: 09/03/2002 Document Revised: 01/28/2015 Document Reviewed: 10/18/2014 Elsevier Interactive Patient Education 2016 [employer]  

## 2016-04-11 ENCOUNTER — Emergency Department (HOSPITAL_COMMUNITY): Payer: Medicare Other

## 2016-04-11 ENCOUNTER — Emergency Department (HOSPITAL_COMMUNITY)
Admission: EM | Admit: 2016-04-11 | Discharge: 2016-04-11 | Disposition: A | Discharge status: Home / Self Care | Payer: Medicare Other | Attending: Emergency Medicine | Admitting: Emergency Medicine

## 2016-04-11 ENCOUNTER — Encounter (HOSPITAL_COMMUNITY): Payer: Self-pay | Admitting: *Deleted

## 2016-04-11 DIAGNOSIS — Y999 Unspecified external cause status: Secondary | ICD-10-CM | POA: Diagnosis not present

## 2016-04-11 DIAGNOSIS — Z7901 Long term (current) use of anticoagulants: Secondary | ICD-10-CM | POA: Insufficient documentation

## 2016-04-11 DIAGNOSIS — I10 Essential (primary) hypertension: Secondary | ICD-10-CM | POA: Diagnosis not present

## 2016-04-11 DIAGNOSIS — S99911A Unspecified injury of right ankle, initial encounter: Secondary | ICD-10-CM | POA: Diagnosis present

## 2016-04-11 DIAGNOSIS — R2689 Other abnormalities of gait and mobility: Secondary | ICD-10-CM | POA: Insufficient documentation

## 2016-04-11 DIAGNOSIS — S93401A Sprain of unspecified ligament of right ankle, initial encounter: Secondary | ICD-10-CM | POA: Diagnosis not present

## 2016-04-11 DIAGNOSIS — Z85820 Personal history of malignant melanoma of skin: Secondary | ICD-10-CM | POA: Diagnosis not present

## 2016-04-11 DIAGNOSIS — W1830XA Fall on same level, unspecified, initial encounter: Secondary | ICD-10-CM | POA: Insufficient documentation

## 2016-04-11 DIAGNOSIS — Y9301 Activity, walking, marching and hiking: Secondary | ICD-10-CM | POA: Insufficient documentation

## 2016-04-11 DIAGNOSIS — Y92002 Bathroom of unspecified non-institutional (private) residence single-family (private) house as the place of occurrence of the external cause: Secondary | ICD-10-CM | POA: Insufficient documentation

## 2016-04-11 DIAGNOSIS — W19XXXA Unspecified fall, initial encounter: Secondary | ICD-10-CM

## 2016-04-11 NOTE — ED Provider Notes (Signed)
CSN: [id number]     Arrival date & time 04/11/16  0320 History   First MD Initiated Contact with Patient 04/11/16 0326     Chief Complaint  Patient presents with  . Fall     (Consider location/radiation/quality/duration/timing/severity/associated sxs/prior Treatment) HPI Tara Shaffer is a 70 y.o. female with history of dementia, hypertension, anxiety and depression, presents to emergency department after a fall. Patient comes from [place]. Patient does not ambulate on her own, and requires assistance. According to the nursing home staff, patient got out of her bed on her own and fell, unwitnessed, and was able to crawl to her bed and salt and call for help. Patient reported pain to the right ankle and left arm. Patient denies hitting her head or loss of consciousness. She denies pain anywhere else. Patient has history of frequent falls. According to nursing home, patient is at baseline mental status wise. She is on eliquis  Past Medical History  Diagnosis Date  . MELANOMA, HX OF 08/04/2007  . ALLERGIC RHINITIS 06/23/2007  . ANXIETY DEPRESSION 06/12/2009  . ANXIETY 06/23/2007  . 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 GERD 06/23/2007  . HYPERLIPIDEMIA 06/24/2007  . HYPERTENSION 06/23/2007  . COLONIC POLYPS, HX OF 03/08/2002  . DIVERTICULOSIS, COLON 08/04/2007  . Irritable bowel syndrome 06/23/2007  . Memory dysfunction 06/23/2011  . Cancer ([place])     skin cx/ hx melanoma  . LOW BACK PAIN 06/24/2007  . Alcohol abuse 02/27/2012  . Dementia    Past Surgical History  Procedure Laterality Date  . Cesarean section      x 2  . Colonoscopy    . Appendectomy     Family History  Problem Relation Age of Onset  . Hypertension Other   . Diabetes Other   . Colon cancer Cousin    Social History  Substance Use Topics  . Smoking status: Never Smoker   . Smokeless tobacco: Never Used  . Alcohol Use: 25.2 oz/week    42 Glasses of wine per week   OB History    No data  available     Review of Systems  Constitutional: Negative for fever and chills.  Respiratory: Negative for cough, chest tightness and shortness of breath.   Cardiovascular: Negative for chest pain, palpitations and leg swelling.  Gastrointestinal: Negative for nausea, vomiting, abdominal pain and diarrhea.  Musculoskeletal: Positive for arthralgias. Negative for myalgias, neck pain and neck stiffness.  Skin: Negative for rash.  Neurological: Negative for dizziness, weakness and headaches.  All other systems reviewed and are negative.     Allergies  Codeine phosphate  Home Medications   Prior to Admission medications   Medication Sig Start Date End Date Taking? Authorizing Provider  amLODipine (NORVASC) 5 MG tablet Take 1 tablet (5 mg total) by mouth daily. 03/17/15   [name], MD  apixaban (ELIQUIS) 5 MG TABS tablet Take 5 mg by mouth 2 (two) times daily.     Historical Provider, MD  ciprofloxacin (CIPRO) 250 MG tablet Take 1 tablet (250 mg total) by mouth 2 (two) times daily. Patient not taking: Reported on 03/25/2016 12/26/15   Nita Sells, MD  divalproex (DEPAKOTE SPRINKLE) 125 MG capsule Take 250 mg by mouth 3 (three) times daily.     Historical Provider, MD  donepezil (ARICEPT) 5 MG tablet Take 5 mg by mouth at bedtime.    Historical Provider, MD  escitalopram (LEXAPRO) 10 MG tablet Take 10 mg by mouth daily.  Historical Provider, MD  ferrous sulfate 325 (65 FE) MG tablet Take 1 tablet (325 mg total) by mouth 3 (three) times daily after meals. 03/17/15   [name], MD  folic acid (FOLVITE) 1 MG tablet Take 1 tablet (1 mg total) by mouth daily. 03/17/15   [name], MD  LORazepam (ATIVAN) 0.5 MG tablet Take 0.5 mg by mouth every 8 (eight) hours.    Historical Provider, MD  LORazepam (ATIVAN) 1 MG tablet Take 1 tablet (1 mg total) by mouth every 8 (eight) hours. Patient not taking: Reported on 03/25/2016 12/26/15   Nita Sells, MD  memantine  (NAMENDA XR) 14 MG CP24 24 hr capsule Take 14 mg by mouth daily.    Historical Provider, MD  Multiple Vitamin (MULTIVITAMIN WITH MINERALS) TABS tablet Take 1 tablet by mouth daily. 03/17/15   [name], MD  potassium chloride SA (K-DUR,KLOR-CON) 20 MEQ tablet Take 2 tablets (40 mEq total) by mouth 2 (two) times daily. Patient not taking: Reported on 03/25/2016 12/26/15   Nita Sells, MD  thiamine 100 MG tablet Take 1 tablet (100 mg total) by mouth daily. 03/17/15   [name], MD  traMADol (ULTRAM) 50 MG tablet Take 1 tablet (50 mg total) by mouth every 8 (eight) hours. 12/26/15   Nita Sells, MD   BP 133/70 mmHg  Pulse 70  Temp(Src) 98.7 F (37.1 C) (Oral)  Resp 18  SpO2 100% Physical Exam  Constitutional: She appears well-developed and well-nourished. No distress.  HENT:  Head: Normocephalic.  Eyes: Conjunctivae and EOM are normal. Pupils are equal, round, and reactive to light.  Neck: Normal range of motion. Neck supple.  No midline cervical spine tenderness  Cardiovascular: Normal rate, regular rhythm and normal heart sounds.   Pulmonary/Chest: Effort normal and breath sounds normal. No respiratory distress. She has no wheezes. She has no rales.  Abdominal: Soft. Bowel sounds are normal. She exhibits no distension. There is no tenderness. There is no rebound.  Musculoskeletal: She exhibits edema.  2+ pitting edema in feet bilaterally. Apparently at baseline per nursing home. Tenderness to palpation of her right ankle joint. Normal range of motion of the ankle joint. Normal foot and knee. No tenderness with palpation over the hips.  Neurological: She is alert.  Oriented to self and place  Skin: Skin is warm and dry.  Psychiatric: She has a normal mood and affect. Her behavior is normal.  Nursing note and vitals reviewed.   ED Course  Procedures (including critical care time) Labs Review Labs Reviewed - No data to display  Imaging Review Dg Ankle  Complete Right  04/11/2016  CLINICAL DATA:  Right ankle pain and edema after a fall 2 days ago. EXAM: RIGHT ANKLE - COMPLETE 3+ VIEW COMPARISON:  None. FINDINGS: Diffuse bone demineralization. Anterior soft tissue swelling over the right ankle. No evidence of acute fracture or dislocation. No focal bone lesion or bone destruction. Plantar calcaneal spur. IMPRESSION: Soft tissue swelling anteriorly.  No acute bony abnormalities. Electronically Signed   By: Lucienne Capers M.D.   On: 04/11/2016 05:02   Ct Head Wo Contrast  04/11/2016  CLINICAL DATA:  Initial evaluation for acute trauma, fall. EXAM: CT HEAD WITHOUT CONTRAST TECHNIQUE: Contiguous axial images were obtained from the base of the skull through the vertex without intravenous contrast. COMPARISON:  Prior CT from 12/14/2015. FINDINGS: Scalp soft tissues demonstrate no acute abnormality. No acute abnormality about the globes and orbits. Paranasal sinuses are clear.  No  mastoid effusion. Calvarium intact. Age-related cerebral atrophy with mild to moderate chronic small vessel ischemic disease, similar to prior. Scattered vascular calcifications within the carotid siphons and distal vertebral arteries. No acute intracranial hemorrhage or infarct. No mass lesion, midline shift, or mass effect. No hydrocephalus. No extra-axial fluid collection. IMPRESSION: 1. No acute intracranial process. 2. Stable atrophy with moderate chronic small vessel ischemic disease. Electronically Signed   By: Jeannine Boga M.D.   On: 04/11/2016 05:30   I have personally reviewed and evaluated these images and lab results as part of my medical decision-making.   EKG Interpretation None      MDM   Final diagnoses:  Fall, initial encounter  Ankle sprain, right, initial encounter   Patient from [place] after unwitnessed fall. She was able to crawl and get help. She's complaining of right ankle pain. Patient is on elequis, will get CT of the head. Also will  order x-ray of the ankle. I did discuss patient with her nurses at her facility, who state the patient falls frequently and that she is at baseline mental status wise.  CT head and x-ray of her right ankle negative. Discussed results with patient. Will discharge her back to the facility. Follow-up with primary care doctor as needed.  Filed Vitals:   04/11/16 0327 04/11/16 0549  BP: 133/70 123/69  Pulse: 70 78  Temp: 98.7 F (37.1 C) 98.2 F (36.8 C)  TempSrc: Oral Oral  Resp: 18 18  SpO2: 100% 98%     Jeannett Senior, PA-C 04/11/16 [place], MD 04/11/16 [phone]

## 2016-04-11 NOTE — ED Notes (Signed)
[facility] at bedside for transport.  

## 2016-04-11 NOTE — ED Notes (Addendum)
Pt to ED by EMS from [facility] c/o fall. Per staff, pt got up to go to the bathroom when she lost her balance and fell. C/o R lower leg and ankle pain, no deformity or crepitus. Pt also had a fall last week. Bruising noted to L knee. Pt denied neck or back pain. Skin tear noted to L arm

## 2016-04-11 NOTE — Discharge Instructions (Signed)
Please follow up with family doctor as needed. Do not stand up or walk unassisted.   Fall Prevention in the Home  Falls can cause injuries and can affect people from all age groups. There are many simple things that you can do to make your home safe and to help prevent falls. WHAT CAN I DO ON THE OUTSIDE OF MY HOME?  Regularly repair the edges of walkways and driveways and fix any cracks.  Remove high doorway thresholds.  Trim any shrubbery on the main path into your home.  Use bright outdoor lighting.  Clear walkways of debris and clutter, including tools and rocks.  Regularly check that handrails are securely fastened and in good repair. Both sides of any steps should have handrails.  Install guardrails along the edges of any raised decks or porches.  Have leaves, snow, and ice cleared regularly.  Use sand or salt on walkways during winter months.  In the garage, clean up any spills right away, including grease or oil spills. WHAT CAN I DO IN THE BATHROOM?  Use night lights.  Install grab bars by the toilet and in the tub and shower. Do not use towel bars as grab bars.  Use non-skid mats or decals on the floor of the tub or shower.  If you need to sit down while you are in the shower, use a plastic, non-slip stool.[place] Kitchen  Keep the floor dry. Immediately clean up any water that spills on the floor.  Remove soap buildup in the tub or shower on a regular basis.  Attach bath mats securely with double-sided non-slip rug tape.  Remove throw rugs and other tripping hazards from the floor. WHAT CAN I DO IN THE BEDROOM?  Use night lights.  Make sure that a bedside light is easy to reach.  Do not use oversized bedding that drapes onto the floor.  Have a firm chair that has side arms to use for getting dressed.  Remove throw rugs and other tripping hazards from the floor. WHAT CAN I DO IN THE KITCHEN?   Clean up any spills right away.  Avoid walking on wet floors.  Place  frequently used items in easy-to-reach places.  If you need to reach for something above you, use a sturdy step stool that has a grab bar.  Keep electrical cables out of the way.  Do not use floor polish or wax that makes floors slippery. If you have to use wax, make sure that it is non-skid floor wax.  Remove throw rugs and other tripping hazards from the floor. WHAT CAN I DO IN THE STAIRWAYS?  Do not leave any items on the stairs.  Make sure that there are handrails on both sides of the stairs. Fix handrails that are broken or loose. Make sure that handrails are as long as the stairways.  Check any carpeting to make sure that it is firmly attached to the stairs. Fix any carpet that is loose or worn.  Avoid having throw rugs at the top or bottom of stairways, or secure the rugs with carpet tape to prevent them from moving.  Make sure that you have a light switch at the top of the stairs and the bottom of the stairs. If you do not have them, have them installed. WHAT ARE SOME OTHER FALL PREVENTION TIPS?  Wear closed-toe shoes that fit well and support your feet. Wear shoes that have rubber soles or low heels.  When you use a stepladder, make sure that it  is completely opened and that the sides are firmly locked. Have someone hold the ladder while you are using it. Do not climb a closed stepladder.  Add color or contrast paint or tape to grab bars and handrails in your home. Place contrasting color strips on the first and last steps.  Use mobility aids as needed, such as canes, walkers, scooters, and crutches.  Turn on lights if it is dark. Replace any light bulbs that burn out.  Set up furniture so that there are clear paths. Keep the furniture in the same spot.  Fix any uneven floor surfaces.  Choose a carpet design that does not hide the edge of steps of a stairway.  Be aware of any and all pets.  Review your medicines with your healthcare provider. Some medicines can cause  dizziness or changes in blood pressure, which increase your risk of falling. Talk with your health care provider about other ways that you can decrease your risk of falls. This may include working with a physical therapist or trainer to improve your strength, balance, and endurance.   This information is not intended to replace advice given to you by your health care provider. Make sure you discuss any questions you have with your health care provider.   Document Released: 09/03/2002 Document Revised: 01/28/2015 Document Reviewed: 10/18/2014 Elsevier Interactive Patient Education [employer].

## 2016-04-28 ENCOUNTER — Encounter (HOSPITAL_COMMUNITY): Payer: Self-pay | Admitting: Emergency Medicine

## 2016-04-28 ENCOUNTER — Emergency Department (HOSPITAL_COMMUNITY)
Admission: EM | Admit: 2016-04-28 | Discharge: 2016-04-29 | Disposition: A | Discharge status: Home / Self Care | Payer: Medicare Other | Attending: Emergency Medicine | Admitting: Emergency Medicine

## 2016-04-28 ENCOUNTER — Emergency Department (HOSPITAL_COMMUNITY): Payer: Medicare Other

## 2016-04-28 DIAGNOSIS — W010XXA Fall on same level from slipping, tripping and stumbling without subsequent striking against object, initial encounter: Secondary | ICD-10-CM | POA: Insufficient documentation

## 2016-04-28 DIAGNOSIS — W19XXXA Unspecified fall, initial encounter: Secondary | ICD-10-CM

## 2016-04-28 DIAGNOSIS — I129 Hypertensive chronic kidney disease with stage 1 through stage 4 chronic kidney disease, or unspecified chronic kidney disease: Secondary | ICD-10-CM | POA: Diagnosis not present

## 2016-04-28 DIAGNOSIS — N182 Chronic kidney disease, stage 2 (mild): Secondary | ICD-10-CM | POA: Insufficient documentation

## 2016-04-28 DIAGNOSIS — Y939 Activity, unspecified: Secondary | ICD-10-CM | POA: Insufficient documentation

## 2016-04-28 DIAGNOSIS — Z85828 Personal history of other malignant neoplasm of skin: Secondary | ICD-10-CM | POA: Insufficient documentation

## 2016-04-28 DIAGNOSIS — Y929 Unspecified place or not applicable: Secondary | ICD-10-CM | POA: Insufficient documentation

## 2016-04-28 DIAGNOSIS — Z792 Long term (current) use of antibiotics: Secondary | ICD-10-CM | POA: Insufficient documentation

## 2016-04-28 DIAGNOSIS — Y999 Unspecified external cause status: Secondary | ICD-10-CM | POA: Diagnosis not present

## 2016-04-28 DIAGNOSIS — Z79899 Other long term (current) drug therapy: Secondary | ICD-10-CM | POA: Insufficient documentation

## 2016-04-28 DIAGNOSIS — Z048 Encounter for examination and observation for other specified reasons: Secondary | ICD-10-CM | POA: Diagnosis not present

## 2016-04-28 DIAGNOSIS — E785 Hyperlipidemia, unspecified: Secondary | ICD-10-CM | POA: Insufficient documentation

## 2016-04-28 DIAGNOSIS — M2540 Effusion, unspecified joint: Secondary | ICD-10-CM | POA: Diagnosis present

## 2016-04-28 DIAGNOSIS — T148XXA Other injury of unspecified body region, initial encounter: Secondary | ICD-10-CM

## 2016-04-28 NOTE — Discharge Instructions (Signed)
Xray of the knee is normal.

## 2016-04-28 NOTE — ED Triage Notes (Addendum)
Per EMS, pt from [facility], had an unwitnessed fall, per family request pt transported for evaluation. Pt denies any complaints. Pt found on back, propped up on arm. Pt reports falling to her knees. Hx dementia.  Pt reports "I got out of my chair and slipped and fell on my knee."

## 2016-04-28 NOTE — ED Notes (Signed)
Patient waiting on PTAR. 

## 2016-04-28 NOTE — ED Provider Notes (Signed)
[facility] DEPT Provider Note   CSN: [id number] Arrival date & time: 04/28/16  [id number]  First Provider Contact:  First MD Initiated Contact with Patient 04/28/16 2024     By signing my name below, I, Ephriam Jenkins, attest that this documentation has been prepared under the direction and in the presence of Junius Creamer NP  Electronically Signed: Ephriam Jenkins, ED Scribe. 04/28/16. 8:45 PM.   History   Chief Complaint Chief Complaint  Patient presents with  . Fall    HPI HPI Comments: Tara Shaffer is a 70 y.o. Female with a PMHx of falls, UTI, CA, HTN, Dementia, brought in by ambulance, who presents to the Emergency Department s/p a fall that occurred today. Pt states she stood up from a sitting position when she slipped and fell; landing on her right knee. Pt denies any pain to her right knee currently. Pt is here per families request for her to be evalutaed.Patient has a history falls; states they are due to "clumsiness" as she has gotten older. Pt states she does not use a cane or walker but does report occasional use of a wheelchair per doctors recommendation.  Pt deneis hitting her head or LOC. Pt denies any numbness or tingling, bowel or bladder incontinence.    The history is provided by the patient. No language interpreter was used.    Past Medical History:  Diagnosis Date  . Alcohol abuse 02/27/2012  . ALLERGIC RHINITIS 06/23/2007  . ANXIETY 06/23/2007  . ANXIETY DEPRESSION 06/12/2009  . Cancer ([place])    skin cx/ hx melanoma  . COLONIC POLYPS, HX OF 03/08/2002  . 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 Dementia   . DIVERTICULOSIS, COLON 08/04/2007  . GERD 06/23/2007  . HYPERLIPIDEMIA 06/24/2007  . HYPERTENSION 06/23/2007  . Irritable bowel syndrome 06/23/2007  . LOW BACK PAIN 06/24/2007  . MELANOMA, HX OF 08/04/2007  . Memory dysfunction 06/23/2011    Patient Active Problem List   Diagnosis Date Noted  . UTI (urinary tract infection) 12/24/2015  . SIRS  (systemic inflammatory response syndrome) ([place]) 12/24/2015  . AKI (acute kidney injury) ([place]) 12/23/2015  . Fracture of femoral neck, right ([place]) 12/23/2015  . Anemia of chronic disease 08/29/2015  . CKD (chronic kidney disease) stage 2, GFR 60-89 ml/min 08/29/2015  . Closed wound of head 08/25/2015  . Mood disorder ([place]) 06/11/2015  . Right leg swelling 03/21/2015  . DVT, femoral, chronic ([place]) 03/20/2015  . Pressure ulcer 03/14/2015  . Malnutrition of moderate degree ([place]) 03/14/2015  . Closed right hip fracture ([place]) 03/13/2015  . Recurrent falls 10/31/2014  . Impaired glucose tolerance 10/31/2014  . Scalp laceration 08/04/2014  . Posterior pain of right hip 12/19/2012  . 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 Alcohol abuse 02/27/2012  . Insomnia 09/17/2011  . Dementia with behavioral disturbance 06/23/2011  . Preventative health care 02/01/2011  . ANXIETY DEPRESSION 06/12/2009  . ELECTROCARDIOGRAM, ABNORMAL 08/20/2008  . 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 DIVERTICULOSIS, COLON 08/04/2007  . MELANOMA, HX OF 08/04/2007  . HLD (hyperlipidemia) 06/24/2007  . Morbid obesity ([place]) 06/23/2007  . Anxiety state 06/23/2007  . Essential hypertension 06/23/2007  . ALLERGIC RHINITIS 06/23/2007  . GERD 06/23/2007  . Irritable bowel syndrome 06/23/2007  . POSTMENOPAUSAL STATUS 06/23/2007  . COLONIC POLYPS, HX OF 03/08/2002    Past Surgical History:  Procedure Laterality Date  . APPENDECTOMY    . CESAREAN SECTION     x 2  .  COLONOSCOPY      OB History    No data available       Home Medications    Prior to Admission medications   Medication Sig Start Date End Date Taking? Authorizing Provider  acetaminophen (TYLENOL) 500 MG tablet Take 500 mg by mouth every 4 (four) hours as needed for mild pain.    Historical Provider, MD  alum & mag hydroxide-simeth ([place]) 200-200-20 MG/5ML suspension Take 30 mLs by mouth every 6 (six)  hours as needed for indigestion or heartburn.    Historical Provider, MD  amLODipine (NORVASC) 5 MG tablet Take 1 tablet (5 mg total) by mouth daily. 03/17/15   [name], MD  apixaban (ELIQUIS) 5 MG TABS tablet Take 5 mg by mouth 2 (two) times daily.     Historical Provider, MD  ciprofloxacin (CIPRO) 250 MG tablet Take 1 tablet (250 mg total) by mouth 2 (two) times daily. Patient not taking: Reported on 03/25/2016 12/26/15   Nita Sells, MD  divalproex (DEPAKOTE SPRINKLE) 125 MG capsule Take 250 mg by mouth 3 (three) times daily.     Historical Provider, MD  donepezil (ARICEPT) 5 MG tablet Take 5 mg by mouth at bedtime.    Historical Provider, MD  escitalopram (LEXAPRO) 10 MG tablet Take 10 mg by mouth daily.    Historical Provider, MD  ferrous sulfate 325 (65 FE) MG tablet Take 1 tablet (325 mg total) by mouth 3 (three) times daily after meals. 03/17/15   [name], MD  folic acid (FOLVITE) 1 MG tablet Take 1 tablet (1 mg total) by mouth daily. 03/17/15   [name], MD  guaifenesin (ROBITUSSIN) 100 MG/5ML syrup Take 200 mg by mouth 3 (three) times daily as needed for cough.    Historical Provider, MD  loperamide (IMODIUM) 2 MG capsule Take 2 mg by mouth as needed for diarrhea or loose stools.    Historical Provider, MD  LORazepam (ATIVAN) 0.5 MG tablet Take 0.5 mg by mouth every 8 (eight) hours.    Historical Provider, MD  LORazepam (ATIVAN) 1 MG tablet Take 1 tablet (1 mg total) by mouth every 8 (eight) hours. Patient not taking: Reported on 03/25/2016 12/26/15   Nita Sells, MD  magnesium hydroxide (MILK OF MAGNESIA) 400 MG/5ML suspension Take 30 mLs by mouth daily as needed for mild constipation.    Historical Provider, MD  memantine (NAMENDA XR) 14 MG CP24 24 hr capsule Take 14 mg by mouth daily.    Historical Provider, MD  Multiple Vitamin (MULTIVITAMIN WITH MINERALS) TABS tablet Take 1 tablet by mouth daily. 03/17/15   [name], MD    neomycin-bacitracin-polymyxin (NEOSPORIN) ointment Apply 1 application topically as needed for wound care. apply to eye    Historical Provider, MD  potassium chloride SA (K-DUR,KLOR-CON) 20 MEQ tablet Take 2 tablets (40 mEq total) by mouth 2 (two) times daily. Patient not taking: Reported on 03/25/2016 12/26/15   Nita Sells, MD  Skin Protectants, Misc. (DIMETHICONE-ZINC OXIDE) cream Apply topically daily.    Historical Provider, MD  thiamine 100 MG tablet Take 1 tablet (100 mg total) by mouth daily. 03/17/15   [name], MD  traMADol (ULTRAM) 50 MG tablet Take 1 tablet (50 mg total) by mouth every 8 (eight) hours. 12/26/15   Nita Sells, MD    Family History Family History  Problem Relation Age of Onset  . Colon cancer Cousin   . Hypertension Other   . Diabetes Other  Social History Social History  Substance Use Topics  . Smoking status: Never Smoker  . Smokeless tobacco: Never Used  . Alcohol use 25.2 oz/week    42 Glasses of wine per week     Allergies   Codeine phosphate   Review of Systems Review of Systems  Unable to perform ROS: Dementia  Musculoskeletal: Positive for joint swelling. Negative for arthralgias and back pain.  Skin: Negative for wound.  All other systems reviewed and are negative.    Physical Exam Updated Vital Signs BP 145/74 (BP Location: Left Arm)   Pulse 87   Temp 97.9 F (36.6 C) (Oral)   Resp 18   SpO2 98%   Physical Exam  Constitutional: She appears well-developed and well-nourished. No distress.  HENT:  Head: Normocephalic and atraumatic.  Eyes: Conjunctivae and EOM are normal. Pupils are equal, round, and reactive to light.  Neck: Normal range of motion.  Cardiovascular: Normal rate and regular rhythm.  Exam reveals no gallop and no friction rub.   No murmur heard. Pulmonary/Chest: Effort normal and breath sounds normal. She has no wheezes. She has no rales.  Musculoskeletal:       Right knee: She  exhibits ecchymosis. She exhibits normal range of motion, no swelling, no effusion, no deformity and no laceration. No tenderness found.       Legs: Neurological: She is alert.  Skin: Skin is warm and dry. She is not diaphoretic.  Psychiatric: She has a normal mood and affect. Judgment normal.  Nursing note and vitals reviewed.    ED Treatments / Results  DIAGNOSTIC STUDIES: Oxygen Saturation is 98% on RA, normal by my interpretation.  COORDINATION OF CARE: 8:40 PM-Discussed treatment plan with pt at bedside and pt agreed to plan.   Labs (all labs ordered are listed, but only abnormal results are displayed) Labs Reviewed - No data to display  EKG  EKG Interpretation None       Radiology No results found.  Procedures Procedures (including critical care time)  Medications Ordered in ED Medications - No data to display   Initial Impression / Assessment and Plan / ED Course  I have reviewed the triage vital signs and the nursing notes.  Pertinent labs & imaging results that were available during my care of the patient were reviewed by me and considered in my medical decision making (see chart for details).  Clinical Course   Went back to check on patient who was sitting in chair at foot of bed.  She stated she was trying [place] home  Sitter to bedside for patient safety   Sam Pendergraft PA-S: Disoriented to time and date. Oriented to place Strength 5/5 RUE, 4/5 LUE. Strength 5/5 Bilateral LE  Final Clinical Impressions(s) / ED Diagnoses   Final diagnoses:  None    New Prescriptions New Prescriptions   No medications on file   I personally performed the services described in this documentation, which was scribed in my presence. The recorded information has been reviewed and is accurate.    Junius Creamer, NP 04/28/16 2124    Junius Creamer, NP 04/28/16 2128    Charlesetta Shanks, MD 04/29/16 1534

## 2016-04-29 NOTE — ED Notes (Signed)
[facility] picked patient up at 22:25

## 2016-05-17 ENCOUNTER — Encounter (HOSPITAL_COMMUNITY): Payer: Self-pay | Admitting: Emergency Medicine

## 2016-05-17 ENCOUNTER — Emergency Department (HOSPITAL_COMMUNITY)
Admission: EM | Admit: 2016-05-17 | Discharge: 2016-05-18 | Disposition: A | Discharge status: Home / Self Care | Payer: Medicare Other | Attending: Emergency Medicine | Admitting: Emergency Medicine

## 2016-05-17 DIAGNOSIS — Z79899 Other long term (current) drug therapy: Secondary | ICD-10-CM | POA: Insufficient documentation

## 2016-05-17 DIAGNOSIS — R21 Rash and other nonspecific skin eruption: Secondary | ICD-10-CM | POA: Diagnosis present

## 2016-05-17 DIAGNOSIS — L89152 Pressure ulcer of sacral region, stage 2: Secondary | ICD-10-CM

## 2016-05-17 DIAGNOSIS — I129 Hypertensive chronic kidney disease with stage 1 through stage 4 chronic kidney disease, or unspecified chronic kidney disease: Secondary | ICD-10-CM | POA: Diagnosis not present

## 2016-05-17 DIAGNOSIS — N182 Chronic kidney disease, stage 2 (mild): Secondary | ICD-10-CM | POA: Diagnosis not present

## 2016-05-17 DIAGNOSIS — Z85820 Personal history of malignant melanoma of skin: Secondary | ICD-10-CM | POA: Diagnosis not present

## 2016-05-17 NOTE — ED Notes (Signed)
Bed: [id number] Expected date:  Expected time:  Means of arrival:  Comments: Skin tear on buttocks

## 2016-05-17 NOTE — ED Triage Notes (Signed)
Pt comes from [facility] via ems, pt c/o is a skin tear on right butt inside area, rash present. Hx dementia , htn,, anxiety,. Pt verbalizes pain to left knee, pain rear side.  No N/V/D  No loc. Full code.   Alert pain to pain and self.  V/s on arrival 140/86, pulse 90, rr 20, spo2 96,  Allergies to codeine.  Pt wound is open skin tear. Pt cant walk on her own.

## 2016-05-17 NOTE — ED Provider Notes (Signed)
[facility] DEPT Provider Note   CSN: [id number] Arrival date & time: 05/17/16  2052     History   Chief Complaint Chief Complaint  Patient presents with  . Wound Check    right butt inside crack   . Rash    HPI Level 5 Caveat: dementia. Tara Shaffer is a 70 y.o. female nursing home resident with a history of frequent falls and dementia. She is being sent in for evaluation of a "skin tear" to her right buttock. It is unclear how long this has been present. She has a skin tear of her left knee that has already been addressed and bandaged but the patient states this is her chief complaint. She does not seem aware of the skin changes to her buttocks. The patient does not walk independently.  HPI  Past Medical History:  Diagnosis Date  . Alcohol abuse 02/27/2012  . ALLERGIC RHINITIS 06/23/2007  . ANXIETY 06/23/2007  . ANXIETY DEPRESSION 06/12/2009  . Cancer ([place])    skin cx/ hx melanoma  . COLONIC POLYPS, HX OF 03/08/2002  . 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 Dementia   . DIVERTICULOSIS, COLON 08/04/2007  . GERD 06/23/2007  . HYPERLIPIDEMIA 06/24/2007  . HYPERTENSION 06/23/2007  . Irritable bowel syndrome 06/23/2007  . LOW BACK PAIN 06/24/2007  . MELANOMA, HX OF 08/04/2007  . Memory dysfunction 06/23/2011    Patient Active Problem List   Diagnosis Date Noted  . UTI (urinary tract infection) 12/24/2015  . SIRS (systemic inflammatory response syndrome) ([place]) 12/24/2015  . AKI (acute kidney injury) ([place]) 12/23/2015  . Fracture of femoral neck, right ([place]) 12/23/2015  . Anemia of chronic disease 08/29/2015  . CKD (chronic kidney disease) stage 2, GFR 60-89 ml/min 08/29/2015  . Closed wound of head 08/25/2015  . Mood disorder ([place]) 06/11/2015  . Right leg swelling 03/21/2015  . DVT, femoral, chronic ([place]) 03/20/2015  . Pressure ulcer 03/14/2015  . Malnutrition of moderate degree ([place]) 03/14/2015  . Closed right hip fracture ([place]) 03/13/2015  .  Recurrent falls 10/31/2014  . Impaired glucose tolerance 10/31/2014  . Scalp laceration 08/04/2014  . Posterior pain of right hip 12/19/2012  . 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 Alcohol abuse 02/27/2012  . Insomnia 09/17/2011  . Dementia with behavioral disturbance 06/23/2011  . Preventative health care 02/01/2011  . ANXIETY DEPRESSION 06/12/2009  . ELECTROCARDIOGRAM, ABNORMAL 08/20/2008  . 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 DIVERTICULOSIS, COLON 08/04/2007  . MELANOMA, HX OF 08/04/2007  . HLD (hyperlipidemia) 06/24/2007  . Morbid obesity ([place]) 06/23/2007  . Anxiety state 06/23/2007  . Essential hypertension 06/23/2007  . ALLERGIC RHINITIS 06/23/2007  . GERD 06/23/2007  . Irritable bowel syndrome 06/23/2007  . POSTMENOPAUSAL STATUS 06/23/2007  . COLONIC POLYPS, HX OF 03/08/2002    Past Surgical History:  Procedure Laterality Date  . APPENDECTOMY    . CESAREAN SECTION     x 2  . COLONOSCOPY      OB History    No data available       Home Medications    Prior to Admission medications   Medication Sig Start Date End Date Taking? Authorizing Provider  acetaminophen (TYLENOL) 500 MG tablet Take 500 mg by mouth every 4 (four) hours as needed for mild pain.   Yes Historical Provider, MD  alum & mag hydroxide-simeth (MINTOX) 200-200-20 MG/5ML suspension Take 30 mLs by mouth every 6 (six) hours as needed for indigestion or heartburn.  Yes Historical Provider, MD  amLODipine (NORVASC) 5 MG tablet Take 1 tablet (5 mg total) by mouth daily. 03/17/15  Yes [name], MD  apixaban (ELIQUIS) 5 MG TABS tablet Take 5 mg by mouth 2 (two) times daily.    Yes Historical Provider, MD  divalproex (DEPAKOTE SPRINKLE) 125 MG capsule Take 250 mg by mouth 3 (three) times daily.    Yes Historical Provider, MD  donepezil (ARICEPT) 5 MG tablet Take 5 mg by mouth at bedtime.   Yes Historical Provider, MD  escitalopram (LEXAPRO) 10 MG  tablet Take 10 mg by mouth daily.   Yes Historical Provider, MD  ferrous sulfate 325 (65 FE) MG tablet Take 1 tablet (325 mg total) by mouth 3 (three) times daily after meals. 03/17/15  Yes [name], MD  folic acid (FOLVITE) 1 MG tablet Take 1 tablet (1 mg total) by mouth daily. 03/17/15  Yes [name], MD  guaifenesin (ROBITUSSIN) 100 MG/5ML syrup Take 200 mg by mouth 3 (three) times daily as needed for cough.   Yes Historical Provider, MD  loperamide (IMODIUM) 2 MG capsule Take 2 mg by mouth as needed for diarrhea or loose stools.   Yes Historical Provider, MD  LORazepam (ATIVAN) 0.5 MG tablet Take 0.5 mg by mouth every 8 (eight) hours.   Yes Historical Provider, MD  magnesium hydroxide (MILK OF MAGNESIA) 400 MG/5ML suspension Take 30 mLs by mouth daily as needed for mild constipation.   Yes Historical Provider, MD  memantine (NAMENDA XR) 14 MG CP24 24 hr capsule Take 14 mg by mouth daily.   Yes Historical Provider, MD  Multiple Vitamin (MULTIVITAMIN WITH MINERALS) TABS tablet Take 1 tablet by mouth daily. 03/17/15  Yes [name], MD  neomycin-bacitracin-polymyxin (NEOSPORIN) ointment Apply 1 application topically as needed for wound care. apply to eye   Yes Historical Provider, MD  Skin Protectants, Misc. (DIMETHICONE-ZINC OXIDE) cream Apply topically daily.   Yes Historical Provider, MD  thiamine 100 MG tablet Take 1 tablet (100 mg total) by mouth daily. 03/17/15  Yes [name], MD  traMADol (ULTRAM) 50 MG tablet Take 1 tablet (50 mg total) by mouth every 8 (eight) hours. 12/26/15  Yes Nita Sells, MD  ciprofloxacin (CIPRO) 250 MG tablet Take 1 tablet (250 mg total) by mouth 2 (two) times daily. Patient not taking: Reported on 03/25/2016 12/26/15   Nita Sells, MD  LORazepam (ATIVAN) 1 MG tablet Take 1 tablet (1 mg total) by mouth every 8 (eight) hours. Patient not taking: Reported on 03/25/2016 12/26/15   Nita Sells, MD  potassium chloride SA  (K-DUR,KLOR-CON) 20 MEQ tablet Take 2 tablets (40 mEq total) by mouth 2 (two) times daily. Patient not taking: Reported on 03/25/2016 12/26/15   Nita Sells, MD    Family History Family History  Problem Relation Age of Onset  . Colon cancer Cousin   . Hypertension Other   . Diabetes Other     Social History Social History  Substance Use Topics  . Smoking status: Never Smoker  . Smokeless tobacco: Never Used  . Alcohol use 25.2 oz/week    42 Glasses of wine per week     Allergies   Codeine phosphate   Review of Systems Review of Systems  Unable to perform ROS: Dementia     Physical Exam Updated Vital Signs BP 155/79 (BP Location: Right Arm)   Pulse 72   Temp 98.3 F (36.8 C) (Oral)   Resp 18  SpO2 96%   Physical Exam General: Well-developed, well-nourished female in no acute distress; appearance consistent with age of record HENT: normocephalic; atraumatic Eyes: pupils equal, round and reactive to light; extraocular muscles intact Neck: supple; nontender Heart: regular rate and rhythm Lungs: clear to auscultation bilaterally Abdomen: soft; nondistended; nontender; bowel sounds present Extremities: Arthritic changes; 2+ edema of the lower legs; small skin tear of left knee with associated ecchymosis Neurologic: Awake, alert and oriented; motor function intact in all extremities and symmetric; no facial droop Skin: Warm and dry; grade 1/2 sacral decubitus ulcer:  Psychiatric: Normal mood and affect    ED Treatments / Results  Decubitus ulcer dressing applied by nursing staff. Patient will need skin care orders from her PCP as this ulcer is likely to worsen.  Procedures (including critical care time)   Final Clinical Impressions(s) / ED Diagnoses   Final diagnoses:  Sacral decubitus ulcer, stage II      Shanon Rosser, MD 05/17/16 2142

## 2016-07-04 ENCOUNTER — Emergency Department (HOSPITAL_COMMUNITY)
Admission: EM | Admit: 2016-07-04 | Discharge: 2016-07-04 | Disposition: A | Discharge status: Home / Self Care | Payer: Medicare Other | Attending: Emergency Medicine | Admitting: Emergency Medicine

## 2016-07-04 ENCOUNTER — Encounter (HOSPITAL_COMMUNITY): Payer: Self-pay | Admitting: Emergency Medicine

## 2016-07-04 ENCOUNTER — Emergency Department (HOSPITAL_COMMUNITY): Payer: Medicare Other

## 2016-07-04 DIAGNOSIS — Z041 Encounter for examination and observation following transport accident: Secondary | ICD-10-CM | POA: Insufficient documentation

## 2016-07-04 DIAGNOSIS — R930 Abnormal findings on diagnostic imaging of skull and head, not elsewhere classified: Secondary | ICD-10-CM | POA: Insufficient documentation

## 2016-07-04 DIAGNOSIS — Y999 Unspecified external cause status: Secondary | ICD-10-CM | POA: Insufficient documentation

## 2016-07-04 DIAGNOSIS — Y92410 Unspecified street and highway as the place of occurrence of the external cause: Secondary | ICD-10-CM | POA: Insufficient documentation

## 2016-07-04 DIAGNOSIS — Z79899 Other long term (current) drug therapy: Secondary | ICD-10-CM | POA: Insufficient documentation

## 2016-07-04 DIAGNOSIS — Z85828 Personal history of other malignant neoplasm of skin: Secondary | ICD-10-CM | POA: Insufficient documentation

## 2016-07-04 DIAGNOSIS — N182 Chronic kidney disease, stage 2 (mild): Secondary | ICD-10-CM | POA: Diagnosis not present

## 2016-07-04 DIAGNOSIS — W19XXXA Unspecified fall, initial encounter: Secondary | ICD-10-CM

## 2016-07-04 DIAGNOSIS — Y939 Activity, unspecified: Secondary | ICD-10-CM | POA: Diagnosis not present

## 2016-07-04 DIAGNOSIS — I129 Hypertensive chronic kidney disease with stage 1 through stage 4 chronic kidney disease, or unspecified chronic kidney disease: Secondary | ICD-10-CM | POA: Diagnosis not present

## 2016-07-04 NOTE — ED Provider Notes (Signed)
[facility] DEPT Provider Note   CSN: [id number] Arrival date & time: 07/04/16  0428     History   Chief Complaint Chief Complaint  Patient presents with  . Fall    HPI Tara Shaffer is a 70 y.o. female.  The history is provided by the EMS personnel. The history is limited by the condition of the patient.  Fall  This is a new problem. The current episode started 1 to 2 hours ago. The problem occurs constantly. The problem has not changed since onset.Pertinent negatives include no chest pain, no abdominal pain and no shortness of breath. Nothing aggravates the symptoms. Nothing relieves the symptoms. She has tried nothing for the symptoms. The treatment provided no relief.    Past Medical History:  Diagnosis Date  . Alcohol abuse 02/27/2012  . ALLERGIC RHINITIS 06/23/2007  . ANXIETY 06/23/2007  . ANXIETY DEPRESSION 06/12/2009  . Cancer ([place])    skin cx/ hx melanoma  . COLONIC POLYPS, HX OF 03/08/2002  . 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 Dementia   . DIVERTICULOSIS, COLON 08/04/2007  . GERD 06/23/2007  . HYPERLIPIDEMIA 06/24/2007  . HYPERTENSION 06/23/2007  . Irritable bowel syndrome 06/23/2007  . LOW BACK PAIN 06/24/2007  . MELANOMA, HX OF 08/04/2007  . Memory dysfunction 06/23/2011    Patient Active Problem List   Diagnosis Date Noted  . UTI (urinary tract infection) 12/24/2015  . SIRS (systemic inflammatory response syndrome) ([place]) 12/24/2015  . AKI (acute kidney injury) ([place]) 12/23/2015  . Fracture of femoral neck, right ([place]) 12/23/2015  . Anemia of chronic disease 08/29/2015  . CKD (chronic kidney disease) stage 2, GFR 60-89 ml/min 08/29/2015  . Closed wound of head 08/25/2015  . Mood disorder ([place]) 06/11/2015  . Right leg swelling 03/21/2015  . DVT, femoral, chronic ([place]) 03/20/2015  . Pressure ulcer 03/14/2015  . Malnutrition of moderate degree ([place]) 03/14/2015  . Closed right hip fracture ([place]) 03/13/2015  . Recurrent falls 10/31/2014    . Impaired glucose tolerance 10/31/2014  . Scalp laceration 08/04/2014  . Posterior pain of right hip 12/19/2012  . 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 Alcohol abuse 02/27/2012  . Insomnia 09/17/2011  . Dementia with behavioral disturbance 06/23/2011  . Preventative health care 02/01/2011  . ANXIETY DEPRESSION 06/12/2009  . ELECTROCARDIOGRAM, ABNORMAL 08/20/2008  . 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 DIVERTICULOSIS, COLON 08/04/2007  . MELANOMA, HX OF 08/04/2007  . HLD (hyperlipidemia) 06/24/2007  . Morbid obesity ([place]) 06/23/2007  . Anxiety state 06/23/2007  . Essential hypertension 06/23/2007  . ALLERGIC RHINITIS 06/23/2007  . GERD 06/23/2007  . Irritable bowel syndrome 06/23/2007  . POSTMENOPAUSAL STATUS 06/23/2007  . COLONIC POLYPS, HX OF 03/08/2002    Past Surgical History:  Procedure Laterality Date  . APPENDECTOMY    . CESAREAN SECTION     x 2  . COLONOSCOPY      OB History    No data available       Home Medications    Prior to Admission medications   Medication Sig Start Date End Date Taking? Authorizing Provider  acetaminophen (TYLENOL) 500 MG tablet Take 500 mg by mouth every 4 (four) hours as needed for mild pain.    Historical Provider, MD  alum & mag hydroxide-simeth ([place]) 200-200-20 MG/5ML suspension Take 30 mLs by mouth every 6 (six) hours as needed for indigestion or heartburn.    Historical Provider, MD  amLODipine (NORVASC) 5 MG tablet Take 1  tablet (5 mg total) by mouth daily. 03/17/15   [name], MD  apixaban (ELIQUIS) 5 MG TABS tablet Take 5 mg by mouth 2 (two) times daily.     Historical Provider, MD  divalproex (DEPAKOTE SPRINKLE) 125 MG capsule Take 250 mg by mouth 3 (three) times daily.     Historical Provider, MD  donepezil (ARICEPT) 5 MG tablet Take 5 mg by mouth at bedtime.    Historical Provider, MD  escitalopram (LEXAPRO) 10 MG tablet Take 10 mg by mouth daily.     Historical Provider, MD  ferrous sulfate 325 (65 FE) MG tablet Take 1 tablet (325 mg total) by mouth 3 (three) times daily after meals. 03/17/15   [name], MD  folic acid (FOLVITE) 1 MG tablet Take 1 tablet (1 mg total) by mouth daily. 03/17/15   [name], MD  guaifenesin (ROBITUSSIN) 100 MG/5ML syrup Take 200 mg by mouth 3 (three) times daily as needed for cough.    Historical Provider, MD  loperamide (IMODIUM) 2 MG capsule Take 2 mg by mouth as needed for diarrhea or loose stools.    Historical Provider, MD  LORazepam (ATIVAN) 0.5 MG tablet Take 0.5 mg by mouth every 8 (eight) hours.    Historical Provider, MD  magnesium hydroxide (MILK OF MAGNESIA) 400 MG/5ML suspension Take 30 mLs by mouth daily as needed for mild constipation.    Historical Provider, MD  memantine (NAMENDA XR) 14 MG CP24 24 hr capsule Take 14 mg by mouth daily.    Historical Provider, MD  Multiple Vitamin (MULTIVITAMIN WITH MINERALS) TABS tablet Take 1 tablet by mouth daily. 03/17/15   [name], MD  neomycin-bacitracin-polymyxin (NEOSPORIN) ointment Apply 1 application topically as needed for wound care. apply to eye    Historical Provider, MD  Skin Protectants, Misc. (DIMETHICONE-ZINC OXIDE) cream Apply topically daily.    Historical Provider, MD  thiamine 100 MG tablet Take 1 tablet (100 mg total) by mouth daily. 03/17/15   [name], MD  traMADol (ULTRAM) 50 MG tablet Take 1 tablet (50 mg total) by mouth every 8 (eight) hours. 12/26/15   Nita Sells, MD    Family History Family History  Problem Relation Age of Onset  . Colon cancer Cousin   . Hypertension Other   . Diabetes Other     Social History Social History  Substance Use Topics  . Smoking status: Never Smoker  . Smokeless tobacco: Never Used  . Alcohol use 25.2 oz/week    42 Glasses of wine per week     Allergies   Codeine phosphate   Review of Systems Review of Systems  Unable to perform ROS: Dementia   Respiratory: Negative for shortness of breath.   Cardiovascular: Negative for chest pain.  Gastrointestinal: Negative for abdominal pain.     Physical Exam Updated Vital Signs BP 135/65 (BP Location: Left Arm)   Pulse 64   Temp 97.9 F (36.6 C) (Oral)   Resp 18   SpO2 96%   Physical Exam  Constitutional: She appears well-developed and well-nourished. No distress.  HENT:  Head: Normocephalic. Head is without raccoon's eyes and without Battle's sign.  Right Ear: No hemotympanum.  Left Ear: No hemotympanum.  Mouth/Throat: No oropharyngeal exudate.  Eyes: Pupils are equal, round, and reactive to light.  Neck: Normal range of motion. Neck supple. No tracheal deviation present. No thyromegaly present.  Cardiovascular: Normal rate, regular rhythm and intact distal pulses.   Pulmonary/Chest: Effort normal  and breath sounds normal. No respiratory distress. She has no wheezes. She has no rales.  Abdominal: Soft. Bowel sounds are normal. She exhibits no mass. There is no tenderness. There is no rebound and no guarding.  Musculoskeletal: Normal range of motion.       Right wrist: Normal.       Left wrist: Normal.       Right hip: Normal.       Left hip: Normal.       Right knee: Normal.       Left knee: Normal.       Cervical back: Normal.       Thoracic back: Normal.       Lumbar back: Normal.  Neurological: She is alert. She has normal reflexes. No cranial nerve deficit.  Skin: Skin is warm and dry. Capillary refill takes less than 2 seconds.  Psychiatric: She has a normal mood and affect.     ED Treatments / Results   Vitals:   07/04/16 0417  BP: 135/65  Pulse: 64  Resp: 18  Temp: 97.9 F (36.6 C)    No results found.  Procedures Procedures (including critical care time)  Medications Ordered in ED Medications - No data to display   Initial Impression / Assessment and Plan / ED Course  I have reviewed the triage vital signs and the nursing notes.  Pertinent  labs & imaging results that were available during my care of the patient were reviewed by me and considered in my medical decision making (see chart for details).  Vitals:   07/04/16 0417  BP: 135/65  Pulse: 64  Resp: 18  Temp: 97.9 F (36.6 C)   Results for orders placed or performed during the hospital encounter of 12/23/15  Culture, blood (routine x 2)  Result Value Ref Range   Specimen Description BLOOD RIGHT ARM    Special Requests BOTTLES DRAWN AEROBIC AND ANAEROBIC 5ML    Culture NO GROWTH 5 DAYS    Report Status 12/28/2015 FINAL   Culture, blood (routine x 2)  Result Value Ref Range   Specimen Description BLOOD LEFT ARM    Special Requests BOTTLES DRAWN AEROBIC AND ANAEROBIC 5ML    Culture NO GROWTH 5 DAYS    Report Status 12/28/2015 FINAL   Urine culture  Result Value Ref Range   Specimen Description URINE, CATHETERIZED    Special Requests NONE    Culture >=100,000 COLONIES/mL PSEUDOMONAS AERUGINOSA    Report Status 12/25/2015 FINAL    Organism ID, Bacteria PSEUDOMONAS AERUGINOSA       Susceptibility   Pseudomonas aeruginosa - MIC*    CEFTAZIDIME 2 SENSITIVE Sensitive     CIPROFLOXACIN <=0.25 SENSITIVE Sensitive     GENTAMICIN 4 SENSITIVE Sensitive     IMIPENEM 1 SENSITIVE Sensitive     PIP/TAZO <=4 SENSITIVE Sensitive     CEFEPIME 4 SENSITIVE Sensitive     * >=100,000 COLONIES/mL PSEUDOMONAS AERUGINOSA  C difficile quick scan w PCR reflex  Result Value Ref Range   C Diff antigen NEGATIVE NEGATIVE   C Diff toxin NEGATIVE NEGATIVE   C Diff interpretation Negative for toxigenic C. difficile   MRSA PCR Screening  Result Value Ref Range   MRSA by PCR NEGATIVE NEGATIVE  Comprehensive metabolic panel  Result Value Ref Range   Sodium 141 135 - 145 mmol/L   Potassium 3.9 3.5 - 5.1 mmol/L   Chloride 107 101 - 111 mmol/L   CO2 21 (L) 22 - 32  mmol/L   Glucose, Bld 90 65 - 99 mg/dL   BUN 27 (H) 6 - 20 mg/dL   Creatinine, Ser 1.72 (H) 0.44 - 1.00 mg/dL   Calcium  8.6 (L) 8.9 - 10.3 mg/dL   Total Protein 7.0 6.5 - 8.1 g/dL   Albumin 2.9 (L) 3.5 - 5.0 g/dL   AST 19 15 - 41 U/L   ALT 10 (L) 14 - 54 U/L   Alkaline Phosphatase 79 38 - 126 U/L   Total Bilirubin 0.8 0.3 - 1.2 mg/dL   GFR calc non Af Amer 29 (L) >60 mL/min   GFR calc Af Amer 34 (L) >60 mL/min   Anion gap 13 5 - 15  Urinalysis, Routine w reflex microscopic (not at [facility])  Result Value Ref Range   Color, Urine YELLOW YELLOW   APPearance TURBID (A) CLEAR   Specific Gravity, Urine 1.013 1.005 - 1.030   pH 6.0 5.0 - 8.0   Glucose, UA NEGATIVE NEGATIVE mg/dL   Hgb urine dipstick LARGE (A) NEGATIVE   Bilirubin Urine NEGATIVE NEGATIVE   Ketones, ur 15 (A) NEGATIVE mg/dL   Protein, ur 100 (A) NEGATIVE mg/dL   Nitrite NEGATIVE NEGATIVE   Leukocytes, UA LARGE (A) NEGATIVE  CBC with Differential  Result Value Ref Range   WBC 9.7 4.0 - 10.5 K/uL   RBC 3.70 (L) 3.87 - 5.11 MIL/uL   Hemoglobin 10.2 (L) 12.0 - 15.0 g/dL   HCT 32.8 (L) 36.0 - 46.0 %   MCV 88.6 78.0 - 100.0 fL   MCH 27.6 26.0 - 34.0 pg   MCHC 31.1 30.0 - 36.0 g/dL   RDW 14.8 11.5 - 15.5 %   Platelets 263 150 - 400 K/uL   Neutrophils Relative % 76 %   Neutro Abs 7.3 1.7 - 7.7 K/uL   Lymphocytes Relative 9 %   Lymphs Abs 0.9 0.7 - 4.0 K/uL   Monocytes Relative 15 %   Monocytes Absolute 1.5 (H) 0.1 - 1.0 K/uL   Eosinophils Relative 0 %   Eosinophils Absolute 0.0 0.0 - 0.7 K/uL   Basophils Relative 0 %   Basophils Absolute 0.0 0.0 - 0.1 K/uL  Influenza panel by PCR (type A & B, H1N1)  Result Value Ref Range   Influenza A By PCR NEGATIVE NEGATIVE   Influenza B By PCR NEGATIVE NEGATIVE   H1N1 flu by pcr NOT DETECTED NOT DETECTED  Urine microscopic-add on  Result Value Ref Range   Squamous Epithelial / LPF 0-5 (A) NONE SEEN   WBC, UA TOO NUMEROUS TO COUNT 0 - 5 WBC/hpf   RBC / HPF 0-5 0 - 5 RBC/hpf   Bacteria, UA MANY (A) NONE SEEN   Urine-Other URINALYSIS PERFORMED ON [facility]   Basic metabolic panel  Result Value  Ref Range   Sodium 146 (H) 135 - 145 mmol/L   Potassium 2.9 (L) 3.5 - 5.1 mmol/L   Chloride 116 (H) 101 - 111 mmol/L   CO2 21 (L) 22 - 32 mmol/L   Glucose, Bld 92 65 - 99 mg/dL   BUN 19 6 - 20 mg/dL   Creatinine, Ser 1.24 (H) 0.44 - 1.00 mg/dL   Calcium 7.8 (L) 8.9 - 10.3 mg/dL   GFR calc non Af Amer 44 (L) >60 mL/min   GFR calc Af Amer 51 (L) >60 mL/min   Anion gap 9 5 - 15  CBC  Result Value Ref Range   WBC 6.6 4.0 - 10.5 K/uL   RBC  2.93 (L) 3.87 - 5.11 MIL/uL   Hemoglobin 8.2 (L) 12.0 - 15.0 g/dL   HCT 26.1 (L) 36.0 - 46.0 %   MCV 89.1 78.0 - 100.0 fL   MCH 28.0 26.0 - 34.0 pg   MCHC 31.4 30.0 - 36.0 g/dL   RDW 14.6 11.5 - 15.5 %   Platelets 180 150 - 400 K/uL  I-Stat CG4 Lactic Acid, ED  (not at [facility])  Result Value Ref Range   Lactic Acid, Venous 1.55 0.5 - 2.0 mmol/L  Occult blood, poc device  Result Value Ref Range   Fecal Occult Bld POSITIVE (A) NEGATIVE   Ct Head Wo Contrast  Result Date: 07/04/2016 CLINICAL DATA:  Status post fall, with concern for head or cervical spine injury. Initial encounter. EXAM: CT HEAD WITHOUT CONTRAST CT CERVICAL SPINE WITHOUT CONTRAST TECHNIQUE: Multidetector CT imaging of the head and cervical spine was performed following the standard protocol without intravenous contrast. Multiplanar CT image reconstructions of the cervical spine were also generated. COMPARISON:  CT of the head performed 04/11/2016, and CT of the cervical spine performed 12/14/2015 FINDINGS: CT HEAD FINDINGS Brain: No evidence of acute infarction, hemorrhage, hydrocephalus, extra-axial collection or mass lesion/mass effect. Prominence of the ventricles and sulci reflects mild to moderate cortical volume loss. Scattered periventricular and subcortical white matter change likely reflects small vessel ischemic microangiopathy. The brainstem and fourth ventricle are within normal limits. The basal ganglia are unremarkable in appearance. The cerebral hemispheres demonstrate grossly  normal gray-white differentiation. No mass effect or midline shift is seen. Vascular: No hyperdense vessel or unexpected calcification. Skull: There is no evidence of fracture; visualized osseous structures are unremarkable in appearance. Sinuses/Orbits: The orbits are within normal limits. The paranasal sinuses and mastoid air cells are well-aerated. Other: Soft tissue swelling is noted overlying the right posterior parietal calvarium. CT CERVICAL SPINE FINDINGS Alignment: Normal. Skull base and vertebrae: No acute fracture. No primary bone lesion or focal pathologic process. Soft tissues and spinal canal: No prevertebral fluid or swelling. No visible canal hematoma. Disc levels: Mild multilevel disc space narrowing is noted along the cervical spine, with scattered small anterior and posterior disc osteophyte complexes. Upper chest: A 0.9 cm hypodensity is noted at the right thyroid lobe, likely benign given its size. The visualized lung apices are grossly clear. Other: No additional soft tissue abnormalities are seen. IMPRESSION: 1. No evidence of traumatic intracranial injury or fracture. 2. No evidence of fracture or subluxation along the cervical spine. 3. Soft tissue swelling overlying the right posterior parietal calvarium. 4. Mild to moderate cortical volume loss and scattered small vessel ischemic microangiopathy. 5. Mild degenerative change along the cervical spine. Electronically Signed   By: Garald Balding M.D.   On: 07/04/2016 06:11   Ct Cervical Spine Wo Contrast  Result Date: 07/04/2016 CLINICAL DATA:  Status post fall, with concern for head or cervical spine injury. Initial encounter. EXAM: CT HEAD WITHOUT CONTRAST CT CERVICAL SPINE WITHOUT CONTRAST TECHNIQUE: Multidetector CT imaging of the head and cervical spine was performed following the standard protocol without intravenous contrast. Multiplanar CT image reconstructions of the cervical spine were also generated. COMPARISON:  CT of the head  performed 04/11/2016, and CT of the cervical spine performed 12/14/2015 FINDINGS: CT HEAD FINDINGS Brain: No evidence of acute infarction, hemorrhage, hydrocephalus, extra-axial collection or mass lesion/mass effect. Prominence of the ventricles and sulci reflects mild to moderate cortical volume loss. Scattered periventricular and subcortical white matter change likely reflects small vessel ischemic microangiopathy. The  brainstem and fourth ventricle are within normal limits. The basal ganglia are unremarkable in appearance. The cerebral hemispheres demonstrate grossly normal gray-white differentiation. No mass effect or midline shift is seen. Vascular: No hyperdense vessel or unexpected calcification. Skull: There is no evidence of fracture; visualized osseous structures are unremarkable in appearance. Sinuses/Orbits: The orbits are within normal limits. The paranasal sinuses and mastoid air cells are well-aerated. Other: Soft tissue swelling is noted overlying the right posterior parietal calvarium. CT CERVICAL SPINE FINDINGS Alignment: Normal. Skull base and vertebrae: No acute fracture. No primary bone lesion or focal pathologic process. Soft tissues and spinal canal: No prevertebral fluid or swelling. No visible canal hematoma. Disc levels: Mild multilevel disc space narrowing is noted along the cervical spine, with scattered small anterior and posterior disc osteophyte complexes. Upper chest: A 0.9 cm hypodensity is noted at the right thyroid lobe, likely benign given its size. The visualized lung apices are grossly clear. Other: No additional soft tissue abnormalities are seen. IMPRESSION: 1. No evidence of traumatic intracranial injury or fracture. 2. No evidence of fracture or subluxation along the cervical spine. 3. Soft tissue swelling overlying the right posterior parietal calvarium. 4. Mild to moderate cortical volume loss and scattered small vessel ischemic microangiopathy. 5. Mild degenerative change  along the cervical spine. Electronically Signed   By: Garald Balding M.D.   On: 07/04/2016 06:11     Final Clinical Impressions(s) / ED Diagnoses   Final diagnoses:  None    New Prescriptions New Prescriptions   No medications on file  All questions answered to patient's satisfaction. Based on history and exam patient has been appropriately medically screened and emergency conditions excluded. Patient is stable for discharge at this time. Follow up with your PMD for recheck in 2 days and strict return precautions given.    Veatrice Kells, MD 07/04/16 [phone]

## 2016-07-04 NOTE — ED Triage Notes (Signed)
Pt had unwitnessed fall at [employer]. Pt is at baseline per facility.

## 2016-07-04 NOTE — ED Notes (Signed)
Report called to facility [facility]). Christina from [facility] took report. NAD noted.

## 2016-07-13 ENCOUNTER — Emergency Department (HOSPITAL_COMMUNITY)
Admission: EM | Admit: 2016-07-13 | Discharge: 2016-07-13 | Disposition: A | Discharge status: Home / Self Care | Payer: Medicare Other | Attending: Emergency Medicine | Admitting: Emergency Medicine

## 2016-07-13 ENCOUNTER — Encounter (HOSPITAL_COMMUNITY): Payer: Self-pay | Admitting: Emergency Medicine

## 2016-07-13 DIAGNOSIS — Z85828 Personal history of other malignant neoplasm of skin: Secondary | ICD-10-CM | POA: Diagnosis not present

## 2016-07-13 DIAGNOSIS — N182 Chronic kidney disease, stage 2 (mild): Secondary | ICD-10-CM | POA: Insufficient documentation

## 2016-07-13 DIAGNOSIS — Z79899 Other long term (current) drug therapy: Secondary | ICD-10-CM | POA: Diagnosis not present

## 2016-07-13 DIAGNOSIS — S81812A Laceration without foreign body, left lower leg, initial encounter: Secondary | ICD-10-CM | POA: Diagnosis not present

## 2016-07-13 DIAGNOSIS — W231XXA Caught, crushed, jammed, or pinched between stationary objects, initial encounter: Secondary | ICD-10-CM | POA: Diagnosis not present

## 2016-07-13 DIAGNOSIS — Y999 Unspecified external cause status: Secondary | ICD-10-CM | POA: Diagnosis not present

## 2016-07-13 DIAGNOSIS — I129 Hypertensive chronic kidney disease with stage 1 through stage 4 chronic kidney disease, or unspecified chronic kidney disease: Secondary | ICD-10-CM | POA: Diagnosis not present

## 2016-07-13 DIAGNOSIS — Y939 Activity, unspecified: Secondary | ICD-10-CM | POA: Diagnosis not present

## 2016-07-13 DIAGNOSIS — Y929 Unspecified place or not applicable: Secondary | ICD-10-CM | POA: Insufficient documentation

## 2016-07-13 DIAGNOSIS — Z7901 Long term (current) use of anticoagulants: Secondary | ICD-10-CM | POA: Diagnosis not present

## 2016-07-13 NOTE — ED Notes (Signed)
Bed: WA17 Expected date:  Expected time:  Means of arrival:  Comments: 70 yo F/ Skin tear

## 2016-07-13 NOTE — ED Provider Notes (Signed)
[facility] DEPT Provider Note   CSN: [id number] Arrival date & time: 07/13/16  [id number]     History   Chief Complaint Chief Complaint  Patient presents with  . Extremity Laceration    HPI Tara Shaffer is a 70 y.o. female.  HPI Patient presents to the emergency department with a skin tear to her left lower leg.  The patient was being transferred from her wheelchair to a chair when her leg suddenly caught against the wheelchair staff reported that they controlled the bleeding and called EMS.  Patient is unable to give me any history due to dementia Past Medical History:  Diagnosis Date  . Alcohol abuse 02/27/2012  . ALLERGIC RHINITIS 06/23/2007  . ANXIETY 06/23/2007  . ANXIETY DEPRESSION 06/12/2009  . Cancer ([place])    skin cx/ hx melanoma  . COLONIC POLYPS, HX OF 03/08/2002  . 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 Dementia   . DIVERTICULOSIS, COLON 08/04/2007  . GERD 06/23/2007  . HYPERLIPIDEMIA 06/24/2007  . HYPERTENSION 06/23/2007  . Irritable bowel syndrome 06/23/2007  . LOW BACK PAIN 06/24/2007  . MELANOMA, HX OF 08/04/2007  . Memory dysfunction 06/23/2011    Patient Active Problem List   Diagnosis Date Noted  . UTI (urinary tract infection) 12/24/2015  . SIRS (systemic inflammatory response syndrome) ([place]) 12/24/2015  . AKI (acute kidney injury) ([place]) 12/23/2015  . Fracture of femoral neck, right ([place]) 12/23/2015  . Anemia of chronic disease 08/29/2015  . CKD (chronic kidney disease) stage 2, GFR 60-89 ml/min 08/29/2015  . Closed wound of head 08/25/2015  . Mood disorder ([place]) 06/11/2015  . Right leg swelling 03/21/2015  . DVT, femoral, chronic ([place]) 03/20/2015  . Pressure ulcer 03/14/2015  . Malnutrition of moderate degree ([place]) 03/14/2015  . Closed right hip fracture ([place]) 03/13/2015  . Recurrent falls 10/31/2014  . Impaired glucose tolerance 10/31/2014  . Scalp laceration 08/04/2014  . Posterior pain of right hip 12/19/2012  . 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 Alcohol abuse 02/27/2012  . Insomnia 09/17/2011  . Dementia with behavioral disturbance 06/23/2011  . Preventative health care 02/01/2011  . ANXIETY DEPRESSION 06/12/2009  . ELECTROCARDIOGRAM, ABNORMAL 08/20/2008  . 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 DIVERTICULOSIS, COLON 08/04/2007  . MELANOMA, HX OF 08/04/2007  . HLD (hyperlipidemia) 06/24/2007  . Morbid obesity ([place]) 06/23/2007  . Anxiety state 06/23/2007  . Essential hypertension 06/23/2007  . ALLERGIC RHINITIS 06/23/2007  . GERD 06/23/2007  . Irritable bowel syndrome 06/23/2007  . POSTMENOPAUSAL STATUS 06/23/2007  . COLONIC POLYPS, HX OF 03/08/2002    Past Surgical History:  Procedure Laterality Date  . APPENDECTOMY    . CESAREAN SECTION     x 2  . COLONOSCOPY      OB History    No data available       Home Medications    Prior to Admission medications   Medication Sig Start Date End Date Taking? Authorizing Provider  acetaminophen (TYLENOL) 500 MG tablet Take 500 mg by mouth every 4 (four) hours as needed for mild pain.   Yes Historical Provider, MD  alum & mag hydroxide-simeth (MINTOX) 200-200-20 MG/5ML suspension Take 30 mLs by mouth every 6 (six) hours as needed for indigestion or heartburn.   Yes Historical Provider, MD  amLODipine (NORVASC) 5 MG tablet Take 1 tablet (5 mg total) by mouth daily. 03/17/15  Yes [name], MD  Chloroxylenol-Zinc Oxide (BAZA EX) Apply 1 application topically daily.  Yes Historical Provider, MD  ciprofloxacin (CIPRO) 500 MG tablet Take 500 mg by mouth 2 (two) times daily. 07/10/16  Yes Historical Provider, MD  divalproex (DEPAKOTE SPRINKLE) 125 MG capsule Take 250 mg by mouth 3 (three) times daily.    Yes Historical Provider, MD  donepezil (ARICEPT) 5 MG tablet Take 5 mg by mouth at bedtime.   Yes Historical Provider, MD  escitalopram (LEXAPRO) 10 MG tablet Take 10 mg by mouth daily.   Yes  Historical Provider, MD  ferrous sulfate 325 (65 FE) MG tablet Take 1 tablet (325 mg total) by mouth 3 (three) times daily after meals. 03/17/15  Yes [name], MD  folic acid (FOLVITE) 1 MG tablet Take 1 tablet (1 mg total) by mouth daily. 03/17/15  Yes [name], MD  furosemide (LASIX) 20 MG tablet Take 20 mg by mouth daily.   Yes Historical Provider, MD  guaifenesin (ROBITUSSIN) 100 MG/5ML syrup Take 200 mg by mouth 3 (three) times daily as needed for cough.   Yes Historical Provider, MD  loperamide (IMODIUM) 2 MG capsule Take 2 mg by mouth as needed for diarrhea or loose stools.   Yes Historical Provider, MD  LORazepam (ATIVAN) 0.5 MG tablet Take 0.5 mg by mouth every 8 (eight) hours.   Yes Historical Provider, MD  magnesium hydroxide (MILK OF MAGNESIA) 400 MG/5ML suspension Take 30 mLs by mouth daily as needed for mild constipation.   Yes Historical Provider, MD  memantine (NAMENDA XR) 14 MG CP24 24 hr capsule Take 14 mg by mouth daily.   Yes Historical Provider, MD  Multiple Vitamin (MULTIVITAMIN WITH MINERALS) TABS tablet Take 1 tablet by mouth daily. 03/17/15  Yes [name], MD  neomycin-bacitracin-polymyxin (NEOSPORIN) ointment Apply 1 application topically as needed for wound care. apply to eye   Yes Historical Provider, MD  rivaroxaban (XARELTO) 20 MG TABS tablet Take 20 mg by mouth daily with supper.   Yes Historical Provider, MD  thiamine 100 MG tablet Take 1 tablet (100 mg total) by mouth daily. 03/17/15  Yes [name], MD  traMADol (ULTRAM) 50 MG tablet Take 1 tablet (50 mg total) by mouth every 8 (eight) hours. 12/26/15  Yes Nita Sells, MD    Family History Family History  Problem Relation Age of Onset  . Colon cancer Cousin   . Hypertension Other   . Diabetes Other     Social History Social History  Substance Use Topics  . Smoking status: Never Smoker  . Smokeless tobacco: Never Used  . Alcohol use 25.2 oz/week    42 Glasses of wine  per week     Allergies   Codeine phosphate   Review of Systems Review of Systems Level V caveat applies due to dementia  Physical Exam Updated Vital Signs BP 131/77 (BP Location: Left Arm)   Pulse 81   Temp 97.5 F (36.4 C) (Oral)   Resp 18   SpO2 95%   Physical Exam  Constitutional: She appears well-developed and well-nourished. No distress.  HENT:  Head: Normocephalic and atraumatic.  Cardiovascular: Normal rate and regular rhythm.   Pulmonary/Chest: Effort normal and breath sounds normal. No respiratory distress.  Musculoskeletal:       Legs:    ED Treatments / Results  Labs (all labs ordered are listed, but only abnormal results are displayed) Labs Reviewed - No data to display  EKG  EKG Interpretation None       Radiology No results found.  Procedures  Procedures (including critical care time)  Medications Ordered in ED Medications - No data to display   Initial Impression / Assessment and Plan / ED Course  I have reviewed the triage vital signs and the nursing notes.  Pertinent labs & imaging results that were available during my care of the patient were reviewed by me and considered in my medical decision making (see chart for details).  Clinical Course   Patient's leg was cleaned up.  This area did not require any advanced closure.  The wound was dressed and advised to keep the area clean and dry   Final Clinical Impressions(s) / ED Diagnoses   Final diagnoses:  None    New Prescriptions New Prescriptions   No medications on file     Dalia Heading, PA-C 07/13/16 [id number]    Daleen Bo, MD 07/13/16 1650

## 2016-07-13 NOTE — ED Notes (Signed)
[facility] called  

## 2016-07-13 NOTE — Discharge Instructions (Signed)
Return here as needed.  Follow-up with your primary doctor. °

## 2016-07-13 NOTE — ED Triage Notes (Signed)
Patient is complaining of a skin tear on left shin. Patient was transfer from wheelchair to chair and scraped her shin. Patient is not complaining of pain. Patient came from [facility] [phone]

## 2016-07-31 ENCOUNTER — Inpatient Hospital Stay (HOSPITAL_COMMUNITY)
Admission: EM | Admit: 2016-07-31 | Discharge: 2016-08-05 | DRG: 689 | Disposition: A | Discharge status: Skilled Nursing Facility | Payer: Medicare Other | Attending: Internal Medicine | Admitting: Internal Medicine

## 2016-07-31 ENCOUNTER — Encounter (HOSPITAL_COMMUNITY): Payer: Self-pay

## 2016-07-31 ENCOUNTER — Emergency Department (HOSPITAL_COMMUNITY): Payer: Medicare Other

## 2016-07-31 DIAGNOSIS — E860 Dehydration: Secondary | ICD-10-CM | POA: Diagnosis present

## 2016-07-31 DIAGNOSIS — G9340 Encephalopathy, unspecified: Secondary | ICD-10-CM | POA: Diagnosis not present

## 2016-07-31 DIAGNOSIS — N1330 Unspecified hydronephrosis: Secondary | ICD-10-CM | POA: Diagnosis present

## 2016-07-31 DIAGNOSIS — Z86718 Personal history of other venous thrombosis and embolism: Secondary | ICD-10-CM | POA: Diagnosis not present

## 2016-07-31 DIAGNOSIS — Z800 Family history of malignant neoplasm of digestive organs: Secondary | ICD-10-CM

## 2016-07-31 DIAGNOSIS — F1097 Alcohol use, unspecified with alcohol-induced persisting dementia: Secondary | ICD-10-CM | POA: Diagnosis not present

## 2016-07-31 DIAGNOSIS — R251 Tremor, unspecified: Secondary | ICD-10-CM | POA: Diagnosis present

## 2016-07-31 DIAGNOSIS — E785 Hyperlipidemia, unspecified: Secondary | ICD-10-CM | POA: Diagnosis present

## 2016-07-31 DIAGNOSIS — N390 Urinary tract infection, site not specified: Secondary | ICD-10-CM | POA: Diagnosis present

## 2016-07-31 DIAGNOSIS — Z6821 Body mass index (BMI) 21.0-21.9, adult: Secondary | ICD-10-CM

## 2016-07-31 DIAGNOSIS — N3000 Acute cystitis without hematuria: Secondary | ICD-10-CM | POA: Diagnosis not present

## 2016-07-31 DIAGNOSIS — Z85820 Personal history of malignant melanoma of skin: Secondary | ICD-10-CM | POA: Diagnosis not present

## 2016-07-31 DIAGNOSIS — D649 Anemia, unspecified: Secondary | ICD-10-CM | POA: Diagnosis present

## 2016-07-31 DIAGNOSIS — E876 Hypokalemia: Secondary | ICD-10-CM | POA: Diagnosis present

## 2016-07-31 DIAGNOSIS — Z8249 Family history of ischemic heart disease and other diseases of the circulatory system: Secondary | ICD-10-CM | POA: Diagnosis not present

## 2016-07-31 DIAGNOSIS — L89302 Pressure ulcer of unspecified buttock, stage 2: Secondary | ICD-10-CM | POA: Diagnosis present

## 2016-07-31 DIAGNOSIS — F0390 Unspecified dementia without behavioral disturbance: Secondary | ICD-10-CM | POA: Diagnosis present

## 2016-07-31 DIAGNOSIS — N179 Acute kidney failure, unspecified: Secondary | ICD-10-CM | POA: Diagnosis present

## 2016-07-31 DIAGNOSIS — Z7901 Long term (current) use of anticoagulants: Secondary | ICD-10-CM | POA: Diagnosis not present

## 2016-07-31 DIAGNOSIS — N182 Chronic kidney disease, stage 2 (mild): Secondary | ICD-10-CM | POA: Diagnosis present

## 2016-07-31 DIAGNOSIS — K219 Gastro-esophageal reflux disease without esophagitis: Secondary | ICD-10-CM | POA: Diagnosis present

## 2016-07-31 DIAGNOSIS — M6281 Muscle weakness (generalized): Secondary | ICD-10-CM

## 2016-07-31 DIAGNOSIS — I82519 Chronic embolism and thrombosis of unspecified femoral vein: Secondary | ICD-10-CM | POA: Diagnosis present

## 2016-07-31 DIAGNOSIS — I129 Hypertensive chronic kidney disease with stage 1 through stage 4 chronic kidney disease, or unspecified chronic kidney disease: Secondary | ICD-10-CM | POA: Diagnosis present

## 2016-07-31 DIAGNOSIS — B9562 Methicillin resistant Staphylococcus aureus infection as the cause of diseases classified elsewhere: Secondary | ICD-10-CM | POA: Diagnosis present

## 2016-07-31 DIAGNOSIS — F329 Major depressive disorder, single episode, unspecified: Secondary | ICD-10-CM | POA: Diagnosis present

## 2016-07-31 DIAGNOSIS — I10 Essential (primary) hypertension: Secondary | ICD-10-CM | POA: Diagnosis not present

## 2016-07-31 DIAGNOSIS — F1010 Alcohol abuse, uncomplicated: Secondary | ICD-10-CM | POA: Diagnosis present

## 2016-07-31 DIAGNOSIS — Z885 Allergy status to narcotic agent status: Secondary | ICD-10-CM

## 2016-07-31 DIAGNOSIS — R109 Unspecified abdominal pain: Secondary | ICD-10-CM

## 2016-07-31 DIAGNOSIS — R296 Repeated falls: Secondary | ICD-10-CM

## 2016-07-31 DIAGNOSIS — E440 Moderate protein-calorie malnutrition: Secondary | ICD-10-CM | POA: Diagnosis present

## 2016-07-31 DIAGNOSIS — R6889 Other general symptoms and signs: Secondary | ICD-10-CM

## 2016-07-31 DIAGNOSIS — F419 Anxiety disorder, unspecified: Secondary | ICD-10-CM | POA: Diagnosis present

## 2016-07-31 DIAGNOSIS — N189 Chronic kidney disease, unspecified: Secondary | ICD-10-CM | POA: Diagnosis not present

## 2016-07-31 LAB — I-STAT CHEM 8, ED
BUN: 23 mg/dL — ABNORMAL HIGH (ref 6–20)
CALCIUM ION: 1.1 mmol/L — AB (ref 1.15–1.40)
Chloride: 99 mmol/L — ABNORMAL LOW (ref 101–111)
Creatinine, Ser: 1.4 mg/dL — ABNORMAL HIGH (ref 0.44–1.00)
GLUCOSE: 91 mg/dL (ref 65–99)
HCT: 32 % — ABNORMAL LOW (ref 36.0–46.0)
HEMOGLOBIN: 10.9 g/dL — AB (ref 12.0–15.0)
Potassium: 2.9 mmol/L — ABNORMAL LOW (ref 3.5–5.1)
Sodium: 143 mmol/L (ref 135–145)
TCO2: 33 mmol/L (ref 0–100)

## 2016-07-31 LAB — URINALYSIS, ROUTINE W REFLEX MICROSCOPIC
Bilirubin Urine: NEGATIVE
GLUCOSE, UA: NEGATIVE mg/dL
Ketones, ur: NEGATIVE mg/dL
Nitrite: NEGATIVE
PH: 7 (ref 5.0–8.0)
Protein, ur: 30 mg/dL — AB
Specific Gravity, Urine: 1.012 (ref 1.005–1.030)

## 2016-07-31 LAB — CBC WITH DIFFERENTIAL/PLATELET
BASOS ABS: 0 10*3/uL (ref 0.0–0.1)
Basophils Relative: 1 %
Eosinophils Absolute: 0.2 10*3/uL (ref 0.0–0.7)
Eosinophils Relative: 3 %
HEMATOCRIT: 35.4 % — AB (ref 36.0–46.0)
Hemoglobin: 11.3 g/dL — ABNORMAL LOW (ref 12.0–15.0)
LYMPHS PCT: 33 %
Lymphs Abs: 2 10*3/uL (ref 0.7–4.0)
MCH: 30.4 pg (ref 26.0–34.0)
MCHC: 31.9 g/dL (ref 30.0–36.0)
MCV: 95.2 fL (ref 78.0–100.0)
MONO ABS: 0.7 10*3/uL (ref 0.1–1.0)
MONOS PCT: 11 %
NEUTROS ABS: 3.2 10*3/uL (ref 1.7–7.7)
Neutrophils Relative %: 52 %
Platelets: 202 10*3/uL (ref 150–400)
RBC: 3.72 MIL/uL — ABNORMAL LOW (ref 3.87–5.11)
RDW: 12.7 % (ref 11.5–15.5)
WBC: 6.1 10*3/uL (ref 4.0–10.5)

## 2016-07-31 LAB — COMPREHENSIVE METABOLIC PANEL
ALBUMIN: 3 g/dL — AB (ref 3.5–5.0)
ALK PHOS: 78 U/L (ref 38–126)
ALT: 12 U/L — ABNORMAL LOW (ref 14–54)
ANION GAP: 11 (ref 5–15)
AST: 20 U/L (ref 15–41)
BUN: 23 mg/dL — AB (ref 6–20)
CALCIUM: 8.9 mg/dL (ref 8.9–10.3)
CO2: 31 mmol/L (ref 22–32)
Chloride: 99 mmol/L — ABNORMAL LOW (ref 101–111)
Creatinine, Ser: 1.47 mg/dL — ABNORMAL HIGH (ref 0.44–1.00)
GFR calc Af Amer: 41 mL/min — ABNORMAL LOW (ref >60)
GFR calc non Af Amer: 35 mL/min — ABNORMAL LOW (ref >60)
GLUCOSE: 86 mg/dL (ref 65–99)
POTASSIUM: 3.2 mmol/L — AB (ref 3.5–5.1)
SODIUM: 141 mmol/L (ref 135–145)
Total Bilirubin: 0.4 mg/dL (ref 0.3–1.2)
Total Protein: 7.4 g/dL (ref 6.5–8.1)

## 2016-07-31 LAB — I-STAT TROPONIN, ED
TROPONIN I, POC: 0 ng/mL (ref 0.00–0.08)
Troponin i, poc: 0.01 ng/mL (ref 0.00–0.08)

## 2016-07-31 LAB — URINE MICROSCOPIC-ADD ON

## 2016-07-31 LAB — I-STAT CG4 LACTIC ACID, ED: LACTIC ACID, VENOUS: 0.64 mmol/L (ref 0.5–1.9)

## 2016-07-31 MED ORDER — ACETAMINOPHEN 650 MG RE SUPP: 650.0000 mg | Freq: Four times a day (QID) | RECTAL | Status: DC | PRN

## 2016-07-31 MED ORDER — DIVALPROEX SODIUM 125 MG PO CSDR
250.0000 mg | DELAYED_RELEASE_CAPSULE | Freq: Three times a day (TID) | ORAL | Status: DC
  Administered 2016-08-01 – 2016-08-05 (×15): 250 mg via ORAL
  Filled 2016-07-31 (×16): qty 2

## 2016-07-31 MED ORDER — MEMANTINE HCL ER 28 MG PO CP24
28.0000 mg | ORAL_CAPSULE | Freq: Every day | ORAL | Status: DC
  Administered 2016-08-01 – 2016-08-05 (×5): 28 mg via ORAL
  Filled 2016-07-31 (×5): qty 1

## 2016-07-31 MED ORDER — ACETAMINOPHEN 325 MG PO TABS: 650.0000 mg | ORAL_TABLET | Freq: Four times a day (QID) | ORAL | Status: DC | PRN

## 2016-07-31 MED ORDER — ESCITALOPRAM OXALATE 10 MG PO TABS
10.0000 mg | ORAL_TABLET | Freq: Every day | ORAL | Status: DC
  Administered 2016-08-01 – 2016-08-05 (×5): 10 mg via ORAL
  Filled 2016-07-31 (×5): qty 1

## 2016-07-31 MED ORDER — LORAZEPAM 0.5 MG PO TABS
0.5000 mg | ORAL_TABLET | Freq: Three times a day (TID) | ORAL | Status: DC
  Administered 2016-08-01: 0.5 mg via ORAL
  Filled 2016-07-31: qty 1

## 2016-07-31 MED ORDER — QUETIAPINE FUMARATE 25 MG PO TABS
12.5000 mg | ORAL_TABLET | Freq: Two times a day (BID) | ORAL | Status: DC
  Administered 2016-08-01 – 2016-08-05 (×10): 12.5 mg via ORAL
  Filled 2016-07-31 (×10): qty 1

## 2016-07-31 MED ORDER — TRAMADOL HCL 50 MG PO TABS
50.0000 mg | ORAL_TABLET | Freq: Three times a day (TID) | ORAL | Status: DC
  Administered 2016-08-01: 50 mg via ORAL
  Filled 2016-07-31: qty 1

## 2016-07-31 MED ORDER — ONDANSETRON HCL 4 MG PO TABS: 4.0000 mg | ORAL_TABLET | Freq: Four times a day (QID) | ORAL | Status: DC | PRN

## 2016-07-31 MED ORDER — AMLODIPINE BESYLATE 5 MG PO TABS
5.0000 mg | ORAL_TABLET | Freq: Every day | ORAL | Status: DC
  Administered 2016-08-01 – 2016-08-05 (×5): 5 mg via ORAL
  Filled 2016-07-31 (×5): qty 1

## 2016-07-31 MED ORDER — ONDANSETRON HCL 4 MG/2ML IJ SOLN: 4.0000 mg | Freq: Four times a day (QID) | INTRAMUSCULAR | Status: DC | PRN

## 2016-07-31 MED ORDER — DONEPEZIL HCL 10 MG PO TABS
10.0000 mg | ORAL_TABLET | Freq: Every day | ORAL | Status: DC
  Administered 2016-08-01 – 2016-08-05 (×5): 10 mg via ORAL
  Filled 2016-07-31 (×4): qty 1

## 2016-07-31 MED ORDER — ADULT MULTIVITAMIN W/MINERALS CH
1.0000 | ORAL_TABLET | Freq: Every day | ORAL | Status: DC
  Administered 2016-08-01 – 2016-08-05 (×5): 1 via ORAL
  Filled 2016-07-31 (×5): qty 1

## 2016-07-31 MED ORDER — FOLIC ACID 1 MG PO TABS
1.0000 mg | ORAL_TABLET | Freq: Every day | ORAL | Status: DC
  Administered 2016-08-01 – 2016-08-05 (×5): 1 mg via ORAL
  Filled 2016-07-31 (×5): qty 1

## 2016-07-31 MED ORDER — VITAMIN B-1 100 MG PO TABS
100.0000 mg | ORAL_TABLET | Freq: Every day | ORAL | Status: DC
  Administered 2016-08-01 – 2016-08-05 (×5): 100 mg via ORAL
  Filled 2016-07-31 (×5): qty 1

## 2016-07-31 MED ORDER — POTASSIUM CHLORIDE CRYS ER 20 MEQ PO TBCR
40.0000 meq | EXTENDED_RELEASE_TABLET | ORAL | Status: AC
  Administered 2016-07-31 – 2016-08-01 (×2): 40 meq via ORAL
  Filled 2016-07-31 (×2): qty 2

## 2016-07-31 MED ORDER — DEXTROSE 5 % IV SOLN
1.0000 g | INTRAVENOUS | Status: DC
  Administered 2016-07-31 – 2016-08-02 (×3): 1 g via INTRAVENOUS
  Filled 2016-07-31 (×5): qty 1

## 2016-07-31 MED ORDER — RIVAROXABAN 20 MG PO TABS
20.0000 mg | ORAL_TABLET | Freq: Every day | ORAL | Status: DC
  Administered 2016-08-01 – 2016-08-05 (×5): 20 mg via ORAL
  Filled 2016-07-31 (×5): qty 1

## 2016-07-31 MED ORDER — SODIUM CHLORIDE 0.9 % IV SOLN
INTRAVENOUS | Status: DC
  Administered 2016-08-01: via INTRAVENOUS

## 2016-07-31 MED ORDER — MEMANTINE HCL-DONEPEZIL HCL ER 28-10 MG PO CP24: 1.0000 | ORAL_CAPSULE | ORAL | Status: DC

## 2016-07-31 NOTE — ED Triage Notes (Signed)
Per EMS, pt from [facility], staff called EMS for seizure activity and stated that pt was more altered than normal, pt has dementia, pt passed stroke exam with ems and following all commands. Tremors all stopped when patient was covered with warm blankets. Pt had fall on 10/17 and was transported and evaluated and sent back to NH. 140/78, HR 60, 99% on RA, CBG 77, RR 18. Pt oriented to person only and that is her baseline.

## 2016-07-31 NOTE — ED Provider Notes (Signed)
[facility] DEPT Provider Note   CSN: [id number] Arrival date & time: 07/31/16  1710     History   Chief Complaint Chief Complaint  Patient presents with  . Tremors    HPI Tara Shaffer is a 70 y.o. female.  HPI 70 yo F with PMHx as below, including h/o dementia, who p/w AMS and "shaking." Per report from EMS, they were called to scene due to decreased LOC x 24 hours with "shaking" despite having a blanket on today. No known fevers. Pt confused, disoriented en route and on arrival. She complains of mild abdominal pain only, but this is distractible.   Level 5 caveat invoked as remainder of history, ROS, and physical exam limited due to patient's dementia.   Past Medical History:  Diagnosis Date  . Alcohol abuse 02/27/2012  . ALLERGIC RHINITIS 06/23/2007  . ANXIETY 06/23/2007  . ANXIETY DEPRESSION 06/12/2009  . Cancer ([place])    skin cx/ hx melanoma  . COLONIC POLYPS, HX OF 03/08/2002  . 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 Dementia   . DIVERTICULOSIS, COLON 08/04/2007  . GERD 06/23/2007  . HYPERLIPIDEMIA 06/24/2007  . HYPERTENSION 06/23/2007  . Irritable bowel syndrome 06/23/2007  . LOW BACK PAIN 06/24/2007  . MELANOMA, HX OF 08/04/2007  . Memory dysfunction 06/23/2011    Patient Active Problem List   Diagnosis Date Noted  . UTI (urinary tract infection) 12/24/2015  . SIRS (systemic inflammatory response syndrome) ([place]) 12/24/2015  . AKI (acute kidney injury) ([place]) 12/23/2015  . Fracture of femoral neck, right ([place]) 12/23/2015  . Anemia of chronic disease 08/29/2015  . CKD (chronic kidney disease) stage 2, GFR 60-89 ml/min 08/29/2015  . Closed wound of head 08/25/2015  . Mood disorder ([place]) 06/11/2015  . Right leg swelling 03/21/2015  . DVT, femoral, chronic ([place]) 03/20/2015  . Pressure ulcer 03/14/2015  . Malnutrition of moderate degree ([place]) 03/14/2015  . Closed right hip fracture ([place]) 03/13/2015  . Recurrent falls 10/31/2014  . Impaired  glucose tolerance 10/31/2014  . Scalp laceration 08/04/2014  . Posterior pain of right hip 12/19/2012  . SIADH (syndrome of inappropriate ADH production) ([place]) 04/13/2012  . Hypokalemia 03/21/2012  . Alcohol abuse 02/27/2012  . Insomnia 09/17/2011  . Dementia with behavioral disturbance 06/23/2011  . Preventative health care 02/01/2011  . ANXIETY DEPRESSION 06/12/2009  . ELECTROCARDIOGRAM, ABNORMAL 08/20/2008  . 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 DIVERTICULOSIS, COLON 08/04/2007  . MELANOMA, HX OF 08/04/2007  . HLD (hyperlipidemia) 06/24/2007  . Morbid obesity ([place]) 06/23/2007  . Anxiety state 06/23/2007  . Essential hypertension 06/23/2007  . ALLERGIC RHINITIS 06/23/2007  . GERD 06/23/2007  . Irritable bowel syndrome 06/23/2007  . POSTMENOPAUSAL STATUS 06/23/2007  . COLONIC POLYPS, HX OF 03/08/2002    Past Surgical History:  Procedure Laterality Date  . APPENDECTOMY    . CESAREAN SECTION     x 2  . COLONOSCOPY      OB History    No data available       Home Medications    Prior to Admission medications   Medication Sig Start Date End Date Taking? Authorizing Provider  amLODipine (NORVASC) 5 MG tablet Take 1 tablet (5 mg total) by mouth daily. 03/17/15  Yes [name], MD  divalproex (DEPAKOTE SPRINKLE) 125 MG capsule Take 250 mg by mouth 3 (three) times daily.    Yes Historical Provider, MD  escitalopram (LEXAPRO) 10 MG tablet Take 10 mg by mouth daily.   Yes  Historical Provider, MD  ferrous sulfate 325 (65 FE) MG tablet Take 1 tablet (325 mg total) by mouth 3 (three) times daily after meals. 03/17/15  Yes [name], MD  folic acid (FOLVITE) 1 MG tablet Take 1 tablet (1 mg total) by mouth daily. 03/17/15  Yes [name], MD  furosemide (LASIX) 20 MG tablet Take 20 mg by mouth daily.   Yes Historical Provider, MD  lanolin ointment Apply 1 application topically as needed for dry skin.   Yes Historical Provider, MD  LORazepam  (ATIVAN) 0.5 MG tablet Take 0.5 mg by mouth every 8 (eight) hours.   Yes Historical Provider, MD  magnesium hydroxide (MILK OF MAGNESIA) 400 MG/5ML suspension Take 30 mLs by mouth daily as needed for mild constipation.   Yes Historical Provider, MD  memantine (NAMENDA XR) 14 MG CP24 24 hr capsule Take 14 mg by mouth daily.   Yes Historical Provider, MD  Memantine HCl-Donepezil HCl (NAMZARIC) 28-10 MG CP24 Take 1 capsule by mouth every morning.   Yes Historical Provider, MD  Multiple Vitamin (MULTIVITAMIN WITH MINERALS) TABS tablet Take 1 tablet by mouth daily. 03/17/15  Yes [name], MD  neomycin-bacitracin-polymyxin (NEOSPORIN) ointment Apply 1 application topically as needed for wound care. apply to eye   Yes Historical Provider, MD  QUEtiapine (SEROQUEL) 25 MG tablet Take 12.5 mg by mouth 2 (two) times daily.   Yes Historical Provider, MD  rivaroxaban (XARELTO) 20 MG TABS tablet Take 20 mg by mouth daily with supper.   Yes Historical Provider, MD  thiamine 100 MG tablet Take 1 tablet (100 mg total) by mouth daily. 03/17/15  Yes [name], MD  traMADol (ULTRAM) 50 MG tablet Take 1 tablet (50 mg total) by mouth every 8 (eight) hours. 12/26/15  Yes Nita Sells, MD  acetaminophen (TYLENOL) 500 MG tablet Take 500 mg by mouth every 4 (four) hours as needed for mild pain.    Historical Provider, MD  alum & mag hydroxide-simeth ([place]) 200-200-20 MG/5ML suspension Take 30 mLs by mouth every 6 (six) hours as needed for indigestion or heartburn.    Historical Provider, MD  Chloroxylenol-Zinc Oxide (BAZA EX) Apply 1 application topically daily.    Historical Provider, MD  ciprofloxacin (CIPRO) 500 MG tablet Take 500 mg by mouth 2 (two) times daily. 07/10/16   Historical Provider, MD  guaifenesin (ROBITUSSIN) 100 MG/5ML syrup Take 200 mg by mouth 3 (three) times daily as needed for cough.    Historical Provider, MD  loperamide (IMODIUM) 2 MG capsule Take 2 mg by mouth as needed for  diarrhea or loose stools.    Historical Provider, MD    Family History Family History  Problem Relation Age of Onset  . Colon cancer Cousin   . Hypertension Other   . Diabetes Other     Social History Social History  Substance Use Topics  . Smoking status: Never Smoker  . Smokeless tobacco: Never Used  . Alcohol use 25.2 oz/week    42 Glasses of wine per week     Allergies   Codeine phosphate   Review of Systems Review of Systems  Unable to perform ROS: Dementia     Physical Exam Updated Vital Signs BP (!) 99/53 (BP Location: Left Arm)   Pulse (!) 56   Temp 98 F (36.7 C) (Oral)   Resp 18   Ht 5\' 1"  (1.549 m)   Wt 112 lb 8 oz (51 kg)   SpO2 97%  BMI 21.26 kg/m   Physical Exam  Constitutional: She appears well-developed and well-nourished. No distress.  HENT:  Head: Normocephalic and atraumatic.  Eyes: Conjunctivae are normal.  Neck: Neck supple.  Cardiovascular: Normal rate, regular rhythm and normal heart sounds.  Exam reveals no friction rub.   No murmur heard. Pulmonary/Chest: Effort normal and breath sounds normal. No respiratory distress. She has no wheezes. She has no rales.  Abdominal: Soft. Bowel sounds are normal. She exhibits no distension. There is tenderness (mild, suprapubic). There is no rebound and no guarding.  Musculoskeletal: She exhibits no edema.  Neurological: She is alert. She exhibits normal muscle tone.  Oriented to person only but pleasant, interactive. Poor concentration.  Skin: Skin is warm. Capillary refill takes less than 2 seconds.  Psychiatric: She has a normal mood and affect.  Nursing note and vitals reviewed.    ED Treatments / Results  Labs (all labs ordered are listed, but only abnormal results are displayed) Labs Reviewed  CBC WITH DIFFERENTIAL/PLATELET - Abnormal; Notable for the following:       Result Value   RBC 3.72 (*)    Hemoglobin 11.3 (*)    HCT 35.4 (*)    All other components within normal limits    COMPREHENSIVE METABOLIC PANEL - Abnormal; Notable for the following:    Potassium 3.2 (*)    Chloride 99 (*)    BUN 23 (*)    Creatinine, Ser 1.47 (*)    Albumin 3.0 (*)    ALT 12 (*)    GFR calc non Af Amer 35 (*)    GFR calc Af Amer 41 (*)    All other components within normal limits  URINALYSIS, ROUTINE W REFLEX MICROSCOPIC (NOT AT [facility]) - Abnormal; Notable for the following:    APPearance TURBID (*)    Hgb urine dipstick MODERATE (*)    Protein, ur 30 (*)    Leukocytes, UA LARGE (*)    All other components within normal limits  URINE MICROSCOPIC-ADD ON - Abnormal; Notable for the following:    Squamous Epithelial / LPF 0-5 (*)    Bacteria, UA RARE (*)    All other components within normal limits  CBC - Abnormal; Notable for the following:    RBC 3.10 (*)    Hemoglobin 9.2 (*)    HCT 29.3 (*)    All other components within normal limits  BASIC METABOLIC PANEL - Abnormal; Notable for the following:    Creatinine, Ser 1.31 (*)    Calcium 8.1 (*)    GFR calc non Af Amer 41 (*)    GFR calc Af Amer 47 (*)    All other components within normal limits  I-STAT CHEM 8, ED - Abnormal; Notable for the following:    Potassium 2.9 (*)    Chloride 99 (*)    BUN 23 (*)    Creatinine, Ser 1.40 (*)    Calcium, Ion 1.10 (*)    Hemoglobin 10.9 (*)    HCT 32.0 (*)    All other components within normal limits  MRSA PCR SCREENING  URINE CULTURE  PROTIME-INR  I-STAT TROPOININ, ED  I-STAT CG4 LACTIC ACID, ED  Randolm Idol, ED  I-STAT CG4 LACTIC ACID, ED    EKG  EKG Interpretation  Date/Time:  Saturday July 31 2016 17:22:07 EDT Ventricular Rate:  63 PR Interval:    QRS Duration: 107 QT Interval:  461 QTC Calculation: 472 R Axis:   -60 Text Interpretation:  Sinus  rhythm Short PR interval LAD Baseline artifact, otherwise no significant change from previous Confirmed by [facility] MD, Clytie Shetley [phone]) on 08/01/2016 3:17:06 AM       Radiology Dg Chest 2 View  Result Date:  07/31/2016 CLINICAL DATA:  Altered mental status.  Seizure activity. EXAM: CHEST  2 VIEW COMPARISON:  12/23/2015 FINDINGS: Lateral view degraded by patient arm position. Artifact degraded lateral view posteriorly. Moderate right hemidiaphragm elevation. Chin overlies the apices. Patient rotated to the right on the frontal. Midline trachea. Mild cardiomegaly with a tortuous thoracic aorta. No pleural effusion or pneumothorax. Remote left rib trauma. Bibasilar scarring or atelectasis. No congestive failure. IMPRESSION: No acute cardiopulmonary disease. Cardiomegaly without congestive failure. Electronically Signed   By: Abigail Miyamoto M.D.   On: 07/31/2016 19:20   Ct Head Wo Contrast  Result Date: 07/31/2016 CLINICAL DATA:  Seizure activity. Altered mental status. History of dementia. EXAM: CT HEAD WITHOUT CONTRAST TECHNIQUE: Contiguous axial images were obtained from the base of the skull through the vertex without intravenous contrast. COMPARISON:  07/04/2016 FINDINGS: Brain: Cerebral atrophy for age. Moderate low density in the periventricular white matter likely related to small vessel disease. Cerebellar atrophy is also age advanced. No mass lesion, hemorrhage, hydrocephalus, acute infarct, intra-axial, or extra-axial fluid collection. Vascular: Bilateral intracranial carotid and left vertebral artery atherosclerosis. Skull: Normal Sinuses/Orbits: Normal orbits and globes. Clear paranasal sinuses and mastoid air cells. Other: None. IMPRESSION: 1.  No acute intracranial abnormality. 2.  Cerebral atrophy and small vessel ischemic change. Electronically Signed   By: Abigail Miyamoto M.D.   On: 07/31/2016 19:23    Procedures Procedures (including critical care time)  Medications Ordered in ED Medications  ceFEPIme (MAXIPIME) 1 g in dextrose 5 % 50 mL IVPB (0 g Intravenous Stopped 07/31/16 2115)  QUEtiapine (SEROQUEL) tablet 12.5 mg (12.5 mg Oral Given 08/01/16 0959)  rivaroxaban (XARELTO) tablet 20 mg (not  administered)  escitalopram (LEXAPRO) tablet 10 mg (10 mg Oral Given 08/01/16 1003)  LORazepam (ATIVAN) tablet 0.5 mg (0.5 mg Oral Not Given 08/01/16 0647)  traMADol (ULTRAM) tablet 50 mg (50 mg Oral Not Given 08/01/16 0647)  divalproex (DEPAKOTE SPRINKLE) capsule 250 mg (250 mg Oral Given 08/01/16 1000)  amLODipine (NORVASC) tablet 5 mg (5 mg Oral Given [id number])  folic acid (FOLVITE) tablet 1 mg (1 mg Oral Given 08/01/16 0958)  multivitamin with minerals tablet 1 tablet (1 tablet Oral Given 08/01/16 0959)  thiamine (VITAMIN B-1) tablet 100 mg (100 mg Oral Given 08/01/16 1000)  0.9 %  sodium chloride infusion ( Intravenous Stopped 08/01/16 0747)  acetaminophen (TYLENOL) tablet 650 mg (not administered)    Or  acetaminophen (TYLENOL) suppository 650 mg (not administered)  ondansetron (ZOFRAN) tablet 4 mg (not administered)    Or  ondansetron (ZOFRAN) injection 4 mg (not administered)  memantine (NAMENDA XR) 24 hr capsule 28 mg (28 mg Oral Given 08/01/16 0959)    And  donepezil (ARICEPT) tablet 10 mg (10 mg Oral Given 08/01/16 0959)  potassium chloride SA (K-DUR,KLOR-CON) CR tablet 40 mEq (40 mEq Oral Given 08/01/16 0025)     Initial Impression / Assessment and Plan / ED Course  I have reviewed the triage vital signs and the nursing notes.  Pertinent labs & imaging results that were available during my care of the patient were reviewed by me and considered in my medical decision making (see chart for details).  Clinical Course     70 yo F with PMHx of HTN, HLD, melanoma, DVT on  Xarelto, EtOH abuse, who p/w mental status changes and shaking. On arrival here, VSS and WNL. Exam is as above. Regarding her AMS, primary suspicion is delirium, likely 2/2 underlying infection (favor UTI given suprapubic TTP, must consider PNA as well). Also polypharmacy. Will check labs, give gentle fluids. CT head shows NAICA and she has no focal neuro deficits to suggest CVA. Otherwise, re: her shaking, this sounds  like rigors based on history. She was alert, with no GTC activity, no tongue biting or signs of seizure.  Labs, imaging as above. CBC shows normal WBC. LA normal. Chemistry with mild hypokalemia, 3.2, which is near baseline, as well as CKD. UA c/w UTI with TNTC WBCs, rare bacteria, many bacteria on cath sample. UCx from prior records reviewed, c/f PSA on recent UCx. WIll start on Cefepime, admit to hospitalist.  Final Clinical Impressions(s) / ED Diagnoses   Final diagnoses:  Complicated UTI (urinary tract infection)  Rigors  Hypokalemia      Duffy Bruce, MD 08/01/16 1029

## 2016-07-31 NOTE — ED Notes (Addendum)
Pt transported to x-ray and then to CT after x-ray

## 2016-07-31 NOTE — ED Notes (Signed)
cefepime bag did not flow properly, pharmacy notified. New medication will be sent.

## 2016-07-31 NOTE — H&P (Signed)
History and Physical    Tara Shaffer [id number] DOB: 1945-12-28 DOA: 07/31/2016  PCP: [name], MD   Patient coming from: SNF [facility])  Chief Complaint: Mental status changes, involuntary shaking  HPI: Tara Shaffer is a 70 y.o. woman with a history of HTN, HLD, melanoma, LLE DVT anticoagulated with XARELTO, dementia, EtOH abuse, and anxiety/depression who was referred to the ED for evaluation of mental status changes (decreased level of consciousness) and involuntary shaking (SNF personnel were worried about seizure; apparently ED attending suspects chills/rigors).  No documented fever or hemodynamic instability.  ED evaluation concerning for UTI (she has grown pseudomonas in the past) and hypokalemia.  She does not appear overtly septic; lactic acid level is pending.  Normal WBC count.  Hemoglobin and renal function appear stable.  Patient denies any pain, anywhere.  No nausea or vomiting.  She denies weakness or light-headedness.  She is oriented to person, place, month, but not year.  She cannot name the POTUS, but she can give her birthday.  She is nonambulatory at baseline.  ED Course: EKG does not show acute ST segment changes.  Troponin negative.  Head CT negative for acute process.  Chest xray shows cardiomegaly without over failure; no acute process.  Potassium 3.2.  U/A shows large leukocytes, TNTC WBC, rare bacteria.  She has received IV cefepime based on prior culture data.  Hospitalist asked to admit.  Review of Systems: As per HPI otherwise 10 point review of systems negative.    Past Medical History:  Diagnosis Date  . Alcohol abuse 02/27/2012  . ALLERGIC RHINITIS 06/23/2007  . ANXIETY 06/23/2007  . ANXIETY DEPRESSION 06/12/2009  . Cancer ([place])    skin cx/ hx melanoma  . COLONIC POLYPS, HX OF 03/08/2002  . 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 Dementia   . DIVERTICULOSIS, COLON 08/04/2007  . GERD 06/23/2007  . HYPERLIPIDEMIA  06/24/2007  . HYPERTENSION 06/23/2007  . Irritable bowel syndrome 06/23/2007  . LOW BACK PAIN 06/24/2007  . MELANOMA, HX OF 08/04/2007  . Memory dysfunction 06/23/2011    Past Surgical History:  Procedure Laterality Date  . APPENDECTOMY    . CESAREAN SECTION     x 2  . COLONOSCOPY       reports that she has never smoked. She has never used smokeless tobacco. She reports that she drinks about 25.2 oz of alcohol per week . She reports that she does not use drugs.  Long-term SNF resident.  Allergies  Allergen Reactions  . Codeine Phosphate Nausea Only    Family History  Problem Relation Age of Onset  . Colon cancer Cousin   . Hypertension Other   . Diabetes Other      Prior to Admission medications   Medication Sig Start Date End Date Taking? Authorizing Provider  amLODipine (NORVASC) 5 MG tablet Take 1 tablet (5 mg total) by mouth daily. 03/17/15  Yes [name], MD  divalproex (DEPAKOTE SPRINKLE) 125 MG capsule Take 250 mg by mouth 3 (three) times daily.    Yes Historical Provider, MD  escitalopram (LEXAPRO) 10 MG tablet Take 10 mg by mouth daily.   Yes Historical Provider, MD  ferrous sulfate 325 (65 FE) MG tablet Take 1 tablet (325 mg total) by mouth 3 (three) times daily after meals. 03/17/15  Yes [name], MD  folic acid (FOLVITE) 1 MG tablet Take 1 tablet (1 mg total) by mouth daily. 03/17/15  Yes [name], MD  furosemide (LASIX) 20 MG tablet Take 20 mg by mouth daily.   Yes Historical Provider, MD  lanolin ointment Apply 1 application topically as needed for dry skin.   Yes Historical Provider, MD  LORazepam (ATIVAN) 0.5 MG tablet Take 0.5 mg by mouth every 8 (eight) hours.   Yes Historical Provider, MD  magnesium hydroxide (MILK OF MAGNESIA) 400 MG/5ML suspension Take 30 mLs by mouth daily as needed for mild constipation.   Yes Historical Provider, MD  memantine (NAMENDA XR) 14 MG CP24 24 hr capsule Take 14 mg by mouth daily.   Yes Historical Provider,  MD  Memantine HCl-Donepezil HCl (NAMZARIC) 28-10 MG CP24 Take 1 capsule by mouth every morning.   Yes Historical Provider, MD  Multiple Vitamin (MULTIVITAMIN WITH MINERALS) TABS tablet Take 1 tablet by mouth daily. 03/17/15  Yes [name], MD  neomycin-bacitracin-polymyxin (NEOSPORIN) ointment Apply 1 application topically as needed for wound care. apply to eye   Yes Historical Provider, MD  QUEtiapine (SEROQUEL) 25 MG tablet Take 12.5 mg by mouth 2 (two) times daily.   Yes Historical Provider, MD  rivaroxaban (XARELTO) 20 MG TABS tablet Take 20 mg by mouth daily with supper.   Yes Historical Provider, MD  thiamine 100 MG tablet Take 1 tablet (100 mg total) by mouth daily. 03/17/15  Yes [name], MD  traMADol (ULTRAM) 50 MG tablet Take 1 tablet (50 mg total) by mouth every 8 (eight) hours. 12/26/15  Yes Nita Sells, MD  acetaminophen (TYLENOL) 500 MG tablet Take 500 mg by mouth every 4 (four) hours as needed for mild pain.    Historical Provider, MD  alum & mag hydroxide-simeth ([place]) 200-200-20 MG/5ML suspension Take 30 mLs by mouth every 6 (six) hours as needed for indigestion or heartburn.    Historical Provider, MD  Chloroxylenol-Zinc Oxide (BAZA EX) Apply 1 application topically daily.    Historical Provider, MD  ciprofloxacin (CIPRO) 500 MG tablet Take 500 mg by mouth 2 (two) times daily. 07/10/16   Historical Provider, MD  guaifenesin (ROBITUSSIN) 100 MG/5ML syrup Take 200 mg by mouth 3 (three) times daily as needed for cough.    Historical Provider, MD  loperamide (IMODIUM) 2 MG capsule Take 2 mg by mouth as needed for diarrhea or loose stools.    Historical Provider, MD    Physical Exam: Vitals:   07/31/16 1945 07/31/16 2000 07/31/16 2015 07/31/16 2049  BP: 116/63 115/66 118/68 117/58  Pulse: 67 64 65 63  Resp:      Temp:      TempSrc:      SpO2: 98% 96% 94% 96%  Weight:      Height:          Constitutional: NAD, calm, comfortable Vitals:   07/31/16  1945 07/31/16 2000 07/31/16 2015 07/31/16 2049  BP: 116/63 115/66 118/68 117/58  Pulse: 67 64 65 63  Resp:      Temp:      TempSrc:      SpO2: 98% 96% 94% 96%  Weight:      Height:       Eyes: PERRL, lids and conjunctivae normal ENMT: Mucous membranes are slightly dry. Posterior pharynx clear of any exudate or lesions. Normal dentition.  Neck: normal appearance, supple Respiratory: clear to auscultation bilaterally, no wheezing, no crackles. Normal respiratory effort. No accessory muscle use.  Cardiovascular: Normal rate, regular rhythm, no murmurs / rubs / gallops. No extremity edema. 2+ pedal pulses. GI: abdomen is soft and compressible.  No distention.  No tenderness.  Bowel sounds are present. Musculoskeletal:  No joint deformity in upper and lower extremities.  Reduced ROM in both legs with contractures.  Normal muscle tone.  Skin: bruising present, pale, dressing on left leg Neurologic: No focal deficits. Psychiatric: Flat affect.  Impaired insight/judgement due to history of dementia.  Oriented to person, place, month.    Labs on Admission: I have personally reviewed following labs and imaging studies  CBC:  Recent Labs Lab 07/31/16 1807 07/31/16 1946  WBC 6.1  --   NEUTROABS 3.2  --   HGB 11.3* 10.9*  HCT 35.4* 32.0*  MCV 95.2  --   PLT 202  --    Basic Metabolic Panel:  Recent Labs Lab 07/31/16 1807 07/31/16 1946  NA 141 143  K 3.2* 2.9*  CL 99* 99*  CO2 31  --   GLUCOSE 86 91  BUN 23* 23*  CREATININE 1.47* 1.40*  CALCIUM 8.9  --    GFR: Estimated Creatinine Clearance: 32 mL/min (by C-G formula based on SCr of 1.4 mg/dL (H)). Liver Function Tests:  Recent Labs Lab 07/31/16 1807  AST 20  ALT 12*  ALKPHOS 78  BILITOT 0.4  PROT 7.4  ALBUMIN 3.0*   Urine analysis:    Component Value Date/Time   COLORURINE YELLOW 07/31/2016 1818   APPEARANCEUR TURBID (A) 07/31/2016 1818   LABSPEC 1.012 07/31/2016 1818   PHURINE 7.0 07/31/2016 1818    GLUCOSEU NEGATIVE 07/31/2016 1818   GLUCOSEU NEGATIVE 09/06/2012 0858   HGBUR MODERATE (A) 07/31/2016 1818   BILIRUBINUR NEGATIVE 07/31/2016 1818   KETONESUR NEGATIVE 07/31/2016 1818   PROTEINUR 30 (A) 07/31/2016 1818   UROBILINOGEN 1.0 03/13/2015 1747   NITRITE NEGATIVE 07/31/2016 1818   LEUKOCYTESUR LARGE (A) 07/31/2016 1818   Sepsis Labs:  Lactic acid level pending  Radiological Exams on Admission: Dg Chest 2 View  Result Date: 07/31/2016 CLINICAL DATA:  Altered mental status.  Seizure activity. EXAM: CHEST  2 VIEW COMPARISON:  12/23/2015 FINDINGS: Lateral view degraded by patient arm position. Artifact degraded lateral view posteriorly. Moderate right hemidiaphragm elevation. Chin overlies the apices. Patient rotated to the right on the frontal. Midline trachea. Mild cardiomegaly with a tortuous thoracic aorta. No pleural effusion or pneumothorax. Remote left rib trauma. Bibasilar scarring or atelectasis. No congestive failure. IMPRESSION: No acute cardiopulmonary disease. Cardiomegaly without congestive failure. Electronically Signed   By: Abigail Miyamoto M.D.   On: 07/31/2016 19:20   Ct Head Wo Contrast  Result Date: 07/31/2016 CLINICAL DATA:  Seizure activity. Altered mental status. History of dementia. EXAM: CT HEAD WITHOUT CONTRAST TECHNIQUE: Contiguous axial images were obtained from the base of the skull through the vertex without intravenous contrast. COMPARISON:  07/04/2016 FINDINGS: Brain: Cerebral atrophy for age. Moderate low density in the periventricular white matter likely related to small vessel disease. Cerebellar atrophy is also age advanced. No mass lesion, hemorrhage, hydrocephalus, acute infarct, intra-axial, or extra-axial fluid collection. Vascular: Bilateral intracranial carotid and left vertebral artery atherosclerosis. Skull: Normal Sinuses/Orbits: Normal orbits and globes. Clear paranasal sinuses and mastoid air cells. Other: None. IMPRESSION: 1.  No acute intracranial  abnormality. 2.  Cerebral atrophy and small vessel ischemic change. Electronically Signed   By: Abigail Miyamoto M.D.   On: 07/31/2016 19:23    EKG: Independently reviewed. Noted above.  Assessment/Plan Active Problems:   Essential hypertension   Hypokalemia   UTI (urinary tract infection)      UTI --Empiric cefepime while awaiting culture data --  Blood cultures for fever greater than 100.4 --Gentle hydration, hold lasix for now --Lactic acid level pending  HTN --Continue amlodipine  Dementia --Continue Memantine/Donepezil  Anxiety/Depression --Continue Seroquel, lexapro, ativan, depakote  History of DVT --On Xarelto  History of EtOH abuse --Continue MVI, thiamine, folate   DVT prophylaxis: Anticoagulated with Xarelto Code Status: FULL Family Communication: Spoke to patient's son by phone (number listed with paperwork sent from [facility]). Disposition Plan: Expect she will go back to SNF when ready for discharge. Consults called: NONE Admission status: Inpatient, med surg.  I expect she will need inpatient treatment including IV antibiotics for at least two midnights while awaiting culture data and monitoring response to therapy.  She has a history of Pseudomonas UTI, a high risk organism, and she is at increased risk for sepsis with multi-organ dysfunction without appropriate anti-microbial coverage.   TIME SPENT: 60 minutes   Eber Jones MD [facility] Pager [phone]  If 7PM-7AM, please contact night-coverage [email or web address] Password [facility]  07/31/2016, 8:53 PM

## 2016-08-01 DIAGNOSIS — N179 Acute kidney failure, unspecified: Secondary | ICD-10-CM

## 2016-08-01 DIAGNOSIS — G9340 Encephalopathy, unspecified: Secondary | ICD-10-CM

## 2016-08-01 LAB — CBC
HEMATOCRIT: 29.3 % — AB (ref 36.0–46.0)
Hemoglobin: 9.2 g/dL — ABNORMAL LOW (ref 12.0–15.0)
MCH: 29.7 pg (ref 26.0–34.0)
MCHC: 31.4 g/dL (ref 30.0–36.0)
MCV: 94.5 fL (ref 78.0–100.0)
PLATELETS: 181 10*3/uL (ref 150–400)
RBC: 3.1 MIL/uL — AB (ref 3.87–5.11)
RDW: 12.7 % (ref 11.5–15.5)
WBC: 5.4 10*3/uL (ref 4.0–10.5)

## 2016-08-01 LAB — PROTIME-INR
INR: 1.07
Prothrombin Time: 13.9 seconds (ref 11.4–15.2)

## 2016-08-01 LAB — BASIC METABOLIC PANEL
ANION GAP: 5 (ref 5–15)
BUN: 17 mg/dL (ref 6–20)
CALCIUM: 8.1 mg/dL — AB (ref 8.9–10.3)
CO2: 29 mmol/L (ref 22–32)
Chloride: 108 mmol/L (ref 101–111)
Creatinine, Ser: 1.31 mg/dL — ABNORMAL HIGH (ref 0.44–1.00)
GFR, EST AFRICAN AMERICAN: 47 mL/min — AB (ref >60)
GFR, EST NON AFRICAN AMERICAN: 41 mL/min — AB (ref >60)
GLUCOSE: 75 mg/dL (ref 65–99)
POTASSIUM: 3.7 mmol/L (ref 3.5–5.1)
Sodium: 142 mmol/L (ref 135–145)

## 2016-08-01 LAB — MRSA PCR SCREENING: MRSA BY PCR: NEGATIVE

## 2016-08-01 MED ORDER — TRAMADOL HCL 50 MG PO TABS
50.0000 mg | ORAL_TABLET | Freq: Four times a day (QID) | ORAL | Status: DC | PRN
Start: 2016-08-01 — End: 2016-08-05

## 2016-08-01 MED ORDER — LORAZEPAM 0.5 MG PO TABS
0.5000 mg | ORAL_TABLET | Freq: Three times a day (TID) | ORAL | Status: DC | PRN
Start: 2016-08-01 — End: 2016-08-05
  Administered 2016-08-04: 0.5 mg via ORAL
  Filled 2016-08-01: qty 1

## 2016-08-01 NOTE — Progress Notes (Signed)
Patient Dr. Vernell Barrier, need wound care consult for stage 2 on sacrum.  Pt has been sleeping most of day, has ativan and ultram scheduled @ 1400, does he want to hold?

## 2016-08-01 NOTE — Consult Note (Signed)
[place] Nurse wound consult note Reason for Consult: Moisture associated skin damage to the perineal area, specifically incontinence associated dermatitis.  Patient has had three loose stools this shift. Consider bowel management system (indwelling) if liquid fecal incontinence continues. Wound type:Moisture Pressure Ulcer POA: No Measurement: Scattered pinpoint partial thickness open areas in the perianal and buttocks area; erythema (blanching). Wound bed: Pink, moist Drainage (amount, consistency, odor) Scant serous Periwound:erythema (described above) Dressing procedure/placement/frequency: I will provide a mattress replacement with low air loss feature to manage microclimate, staff has been cleaning in a timely manner and applying moisture barrier ointment.  I will ask them to minimize time spent in the supine position.  Pressure redistribution heel boots are provided for this patient at high risk for skin breakdown and guidance for use of bed linens is provided for Nursing via the Orders. [place] nursing team will not follow, but will remain available to this patient, the nursing and medical teams.  Please re-consult if needed. Thanks, Maudie Flakes, MSN, RN, [place], Arther Abbott  Pager# [phone]

## 2016-08-01 NOTE — Progress Notes (Signed)
[facility] PROGRESS NOTE  Tara Shaffer [id number] DOB: 21-Dec-1945 DOA: 07/31/2016  PCP: [name], MD  Brief History/Interval Summary: 70 year old Caucasian female with a past medical history of dementia, hypertension, hyperlipidemia, history of DVT on anticoagulation, anxiety and depression who was brought in due to mental status changes and involuntary shaking episodes. Patient was noted to have an abnormal UA. She was hospitalized for further management.  Reason for Visit: Urinary tract infection and acute encephalopathy  Consultants: None  Procedures: None  Antibiotics: Cefepime  Subjective/Interval History: Patient is awake, distracted. Not very communicative. Denies any pain.  ROS: Denies any nausea or vomiting  Objective:  Vital Signs  Vitals:   07/31/16 2100 07/31/16 2216 08/01/16 0501 08/01/16 0856  BP: 113/57 126/61 112/62 (!) 99/53  Pulse: 62 (!) 57 (!) 53 (!) 56  Resp:   19 18  Temp:  97.9 F (36.6 C) 97.9 F (36.6 C) 98 F (36.7 C)  TempSrc:  Oral Oral Oral  SpO2: 96% 98% 95% 97%  Weight:  51 kg (112 lb 8 oz)    Height:  5\' 1"  (1.549 m)      Intake/Output Summary (Last 24 hours) at 08/01/16 1236 Last data filed at 08/01/16 1050  Gross per 24 hour  Intake           771.67 ml  Output              180 ml  Net           591.67 ml   Filed Weights   07/31/16 1714 07/31/16 2216  Weight: 62.1 kg (137 lb) 51 kg (112 lb 8 oz)    General appearance: alert, cooperative, appears stated age, distracted and no distress Head: Normocephalic, without obvious abnormality, atraumatic Resp: clear to auscultation bilaterally Cardio: regular rate and rhythm, S1, S2 normal, no murmur, click, rub or gallop GI: soft, non-tender; bowel sounds normal; no masses,  no organomegaly Extremities: extremities normal, atraumatic, no cyanosis or edema Neurologic: Awake and alert. Distracted at times. No facial asymmetry. Moving all her extremities.  Lab  Results:  Data Reviewed: I have personally reviewed following labs and imaging studies  CBC:  Recent Labs Lab 07/31/16 1807 07/31/16 1946 08/01/16 0546  WBC 6.1  --  5.4  NEUTROABS 3.2  --   --   HGB 11.3* 10.9* 9.2*  HCT 35.4* 32.0* 29.3*  MCV 95.2  --  94.5  PLT 202  --  0000000    Basic Metabolic Panel:  Recent Labs Lab 07/31/16 1807 07/31/16 1946 08/01/16 0546  NA 141 143 142  K 3.2* 2.9* 3.7  CL 99* 99* 108  CO2 31  --  29  GLUCOSE 86 91 75  BUN 23* 23* 17  CREATININE 1.47* 1.40* 1.31*  CALCIUM 8.9  --  8.1*    GFR: Estimated Creatinine Clearance: 30.6 mL/min (by C-G formula based on SCr of 1.31 mg/dL (H)).  Liver Function Tests:  Recent Labs Lab 07/31/16 1807  AST 20  ALT 12*  ALKPHOS 78  BILITOT 0.4  PROT 7.4  ALBUMIN 3.0*    Coagulation Profile:  Recent Labs Lab 08/01/16 0546  INR 1.07      Recent Results (from the past 240 hour(s))  Urine culture     Status: None (Preliminary result)   Collection Time: 07/31/16  6:18 PM  Result Value Ref Range Status   Specimen Description URINE, CATHETERIZED  Final   Special Requests NONE  Final   Culture  PENDING  Incomplete   Report Status PENDING  Incomplete  MRSA PCR Screening     Status: None   Collection Time: 08/01/16 12:13 AM  Result Value Ref Range Status   MRSA by PCR NEGATIVE NEGATIVE Final    Comment:        The GeneXpert MRSA Assay (FDA approved for NASAL specimens only), is one component of a comprehensive MRSA colonization surveillance program. It is not intended to diagnose MRSA infection nor to guide or monitor treatment for MRSA infections.       Radiology Studies: Dg Chest 2 View  Result Date: 07/31/2016 CLINICAL DATA:  Altered mental status.  Seizure activity. EXAM: CHEST  2 VIEW COMPARISON:  12/23/2015 FINDINGS: Lateral view degraded by patient arm position. Artifact degraded lateral view posteriorly. Moderate right hemidiaphragm elevation. Chin overlies the apices.  Patient rotated to the right on the frontal. Midline trachea. Mild cardiomegaly with a tortuous thoracic aorta. No pleural effusion or pneumothorax. Remote left rib trauma. Bibasilar scarring or atelectasis. No congestive failure. IMPRESSION: No acute cardiopulmonary disease. Cardiomegaly without congestive failure. Electronically Signed   By: Abigail Miyamoto M.D.   On: 07/31/2016 19:20   Ct Head Wo Contrast  Result Date: 07/31/2016 CLINICAL DATA:  Seizure activity. Altered mental status. History of dementia. EXAM: CT HEAD WITHOUT CONTRAST TECHNIQUE: Contiguous axial images were obtained from the base of the skull through the vertex without intravenous contrast. COMPARISON:  07/04/2016 FINDINGS: Brain: Cerebral atrophy for age. Moderate low density in the periventricular white matter likely related to small vessel disease. Cerebellar atrophy is also age advanced. No mass lesion, hemorrhage, hydrocephalus, acute infarct, intra-axial, or extra-axial fluid collection. Vascular: Bilateral intracranial carotid and left vertebral artery atherosclerosis. Skull: Normal Sinuses/Orbits: Normal orbits and globes. Clear paranasal sinuses and mastoid air cells. Other: None. IMPRESSION: 1.  No acute intracranial abnormality. 2.  Cerebral atrophy and small vessel ischemic change. Electronically Signed   By: Abigail Miyamoto M.D.   On: 07/31/2016 19:23     Medications:  Scheduled: . amLODipine  5 mg Oral Daily  . ceFEPime (MAXIPIME) IV  1 g Intravenous Q24H  . divalproex  250 mg Oral TID  . memantine  28 mg Oral Daily   And  . donepezil  10 mg Oral Daily  . escitalopram  10 mg Oral Daily  . folic acid  1 mg Oral Daily  . LORazepam  0.5 mg Oral Q8H  . multivitamin with minerals  1 tablet Oral Daily  . QUEtiapine  12.5 mg Oral BID  . rivaroxaban  20 mg Oral Q supper  . thiamine  100 mg Oral Daily  . traMADol  50 mg Oral Q8H   Continuous:  [id number] **OR** acetaminophen, ondansetron **OR** ondansetron  (ZOFRAN) IV  Assessment/Plan:  Active Problems:   Essential hypertension   Hypokalemia   UTI (urinary tract infection)    UTI Patient has a previous history of Pseudomonas UTI. She is currently on cefepime. Await culture data. Lactic acid level was normal. WBC was also noted to be normal. No clear evidence for sepsis.  Acute encephalopathy This is in the setting of known dementia. Could've had worsening confusion due to the infection. Currently seems stable. CT scan of the head did not show any acute findings. Atrophy was noted. Continue to monitor.  Stage II decubitus on buttock Wound care consult.  Essential hypertension Continue amlodipine. Monitor blood pressures closely.  Acute on likely chronic kidney disease, unclear stage Creatinine was slightly elevated at the time  of admission. She was hydrated. Creatinine has improved. Continue to monitor urine output.  Normocytic anemia Hemoglobin is close to her baseline. No evidence for overt bleeding. Continue to monitor.  Dementia Continue Memantine/Donepezil  Anxiety/Depression Seems to be somewhat lethargic this morning. Change Ativan to as needed.   History of DVT On Xarelto  History of EtOH abuse Continue MVI, thiamine, folate.   DVT Prophylaxis: On oral anticoagulation    Code Status: Full code  Family Communication: No family at bedside  Disposition Plan: Management as outlined above.    LOS: 1 day   [facility] Pager [phone] 08/01/2016, 12:36 PM  If 7PM-7AM, please contact night-coverage at [email or web address], password [facility]

## 2016-08-01 NOTE — Progress Notes (Signed)
Paged Dr. Roxine Caddy, patient has had 4 small to large black stools nonmalodorous.

## 2016-08-02 DIAGNOSIS — D631 Anemia in chronic kidney disease: Secondary | ICD-10-CM

## 2016-08-02 DIAGNOSIS — N189 Chronic kidney disease, unspecified: Secondary | ICD-10-CM

## 2016-08-02 LAB — BASIC METABOLIC PANEL
ANION GAP: 6 (ref 5–15)
BUN: 15 mg/dL (ref 6–20)
CALCIUM: 8.7 mg/dL — AB (ref 8.9–10.3)
CO2: 30 mmol/L (ref 22–32)
CREATININE: 1.46 mg/dL — AB (ref 0.44–1.00)
Chloride: 107 mmol/L (ref 101–111)
GFR, EST AFRICAN AMERICAN: 41 mL/min — AB (ref >60)
GFR, EST NON AFRICAN AMERICAN: 36 mL/min — AB (ref >60)
Glucose, Bld: 71 mg/dL (ref 65–99)
Potassium: 3.9 mmol/L (ref 3.5–5.1)
Sodium: 143 mmol/L (ref 135–145)

## 2016-08-02 LAB — CBC
HCT: 32.2 % — ABNORMAL LOW (ref 36.0–46.0)
Hemoglobin: 9.9 g/dL — ABNORMAL LOW (ref 12.0–15.0)
MCH: 29.3 pg (ref 26.0–34.0)
MCHC: 30.7 g/dL (ref 30.0–36.0)
MCV: 95.3 fL (ref 78.0–100.0)
PLATELETS: 212 10*3/uL (ref 150–400)
RBC: 3.38 MIL/uL — ABNORMAL LOW (ref 3.87–5.11)
RDW: 12.8 % (ref 11.5–15.5)
WBC: 5.6 10*3/uL (ref 4.0–10.5)

## 2016-08-02 NOTE — Evaluation (Signed)
Physical Therapy Evaluation Patient Details Name: Tara Shaffer MRN: [id number] DOB: [birth year] Today's Date: 08/02/2016   History of Present Illness  70 y.o. female admitted to [facility] from SNF for mental status changes, involuntary shaking.  Pt dx with UTI with acute encepholopathy and multiple pressure wounds.  Pt with significant PMHx of low back pain, HTN, dicerticulosis, dementia, cervical spine degenreative disease, and anxiety.  Clinical Impression  Pt with significant generalized weakness and left knee flexion contracture.  It doesn't look like she is currently ambulatory based on her physical presentation.  She is appropriate for return to SNF at discharge.   PT to follow acutely for deficits listed below.       Follow Up Recommendations SNF    Equipment Recommendations  None recommended by PT    Recommendations for Other Services   NA    Precautions / Restrictions Precautions Precautions: Fall      Mobility  Bed Mobility Overal bed mobility: Needs Assistance Bed Mobility: Supine to Sit;Sit to Supine     Supine to sit: Min assist Sit to supine: Mod assist   General bed mobility comments: Min assist to pull to sitting EOB and lightly assist with   Transfers Overall transfer level: Needs assistance Equipment used: None Transfers: Sit to/from Stand Sit to Stand: Mod assist         General transfer comment: Pt needed mod assist to stand from bed to switch out wet bed linens and for pericare.  Pt stood ~1-2 mins over shaking knees holding onto therapist with her arms.    Ambulation/Gait             General Gait Details: unable in current condition.  Her physical state makes me wonder if she was even ambulatory with assit at all.  Her physical appearance seems like a person that would be assisted into a WC and use WC as primary means of transportation.          Balance Overall balance assessment: Needs assistance Sitting-balance support: Feet  supported;Bilateral upper extremity supported Sitting balance-Leahy Scale: Fair Sitting balance - Comments: close supervision EOB with both upper extremities supported.    Standing balance support: Bilateral upper extremity supported Standing balance-Leahy Scale: Poor Standing balance comment: mod physical assist to stand.                              Pertinent Vitals/Pain Pain Assessment: No/denies pain    Home Living Family/patient expects to be discharged to:: Skilled nursing facility (from SNF)                 Additional Comments: Pt is a poor historian    Prior Function Level of Independence: Needs assistance   Gait / Transfers Assistance Needed: per RN she got the impression that she has frequent falls at the SNF              Extremity/Trunk Assessment   Upper Extremity Assessment: Generalized weakness           Lower Extremity Assessment: Generalized weakness;LLE deficits/detail   LLE Deficits / Details: left leg wtih knee flexion contracture, weaker than right, interestingly enough, she does not have an ankle contracture on this side and doesn't report pain with PROM to knee, however, there is a hard end feel.   Cervical / Trunk Assessment: Kyphotic  Communication   Communication: No difficulties  Cognition Arousal/Alertness: Awake/alert Behavior During Therapy: Anxious;Flat affect Overall  Cognitive Status: History of cognitive impairments - at baseline (no family here to report if this is her baseline.)                             Assessment/Plan    PT Assessment Patient needs continued PT services  PT Problem List Decreased strength;Decreased activity tolerance;Decreased balance;Decreased mobility;Decreased cognition;Decreased knowledge of use of DME;Decreased safety awareness          PT Treatment Interventions DME instruction;Functional mobility training;Therapeutic activities;Therapeutic exercise;Balance  training;Patient/family education;Cognitive remediation    PT Goals (Current goals can be found in the Care Plan section)  Acute Rehab PT Goals Patient Stated Goal: none stated PT Goal Formulation: Patient unable to participate in goal setting Time For Goal Achievement: 08/16/16 Potential to Achieve Goals: Fair    Frequency Min 2X/week           End of Session Equipment Utilized During Treatment: Gait belt Activity Tolerance: Patient limited by fatigue Patient left: in bed;with call bell/phone within reach           Time: 1101-1116 PT Time Calculation (min) (ACUTE ONLY): 15 min   Charges:   PT Evaluation $PT Eval Moderate Complexity: 1 Procedure          Kammy Klett B. Jaquesha Boroff, PT, DPT [phone]   08/02/2016, 2:07 PM

## 2016-08-02 NOTE — Progress Notes (Signed)
OT Cancellation Note  Patient Details Name: TAMU MALSAM MRN: [id number] DOB: Feb 11, 1946   Cancelled Treatment:    Reason Eval/Treat Not Completed: OT screened, no needs identified, will sign off. Pt is a SNF resident and dependent at baseline. Plan is to return to SNF. Will defer OT to SNF's discretion.  Malka So 08/02/2016, 11:16 AM  [phone]

## 2016-08-02 NOTE — Progress Notes (Signed)
[facility] PROGRESS NOTE  Tara Shaffer [id number] DOB: 1946-01-01 DOA: 07/31/2016  PCP: [name], MD  Brief History/Interval Summary: 70 year old Caucasian female with a past medical history of dementia, hypertension, hyperlipidemia, history of DVT on anticoagulation, anxiety and depression who was brought in due to mental status changes and involuntary shaking episodes. Patient was noted to have an abnormal UA. She was hospitalized for further management.  Reason for Visit: Urinary tract infection and acute encephalopathy  Consultants: None  Procedures: None  Antibiotics: Cefepime  Subjective/Interval History: Patient had a few loose stools yesterday which were black in color. However, patient was noted to be taking iron tablets at home. Patient has not had any further episodes of loose stools overnight. Patient is a not very communicative. Continues to have vague abdominal pain. Denies any other symptoms.   ROS: Denies any nausea or vomiting  Objective:  Vital Signs  Vitals:   08/01/16 1803 08/01/16 2154 08/02/16 0615 08/02/16 0900  BP: 115/63 (!) 95/54 (!) 107/55 (!) 105/54  Pulse: 79 65 (!) 57 60  Resp: 17 18 18 18   Temp: 98.6 F (37 C) 99.2 F (37.3 C) 98.4 F (36.9 C) 98.7 F (37.1 C)  TempSrc: Oral Axillary Axillary Oral  SpO2: 95% 95% 94% 95%  Weight:  53.1 kg (117 lb 1.6 oz)    Height:        Intake/Output Summary (Last 24 hours) at 08/02/16 1114 Last data filed at 08/02/16 1000  Gross per 24 hour  Intake              650 ml  Output                0 ml  Net              650 ml   Filed Weights   07/31/16 1714 07/31/16 2216 08/01/16 2154  Weight: 62.1 kg (137 lb) 51 kg (112 lb 8 oz) 53.1 kg (117 lb 1.6 oz)    General appearance: alert, cooperative, appears stated age, distracted and no distress Resp: clear to auscultation bilaterally Cardio: regular rate and rhythm, S1, S2 normal, no murmur, click, rub or gallop GI: soft, Vague  diffuse tenderness without any rebound, rigidity or guarding. no masses,  no organomegaly Extremities: extremities normal, atraumatic, no cyanosis or edema Neurologic: Awake and alert. Distracted at times. No facial asymmetry. Moving all her extremities.  Lab Results:  Data Reviewed: I have personally reviewed following labs and imaging studies  CBC:  Recent Labs Lab 07/31/16 1807 07/31/16 1946 08/01/16 0546 08/02/16 0646  WBC 6.1  --  5.4 5.6  NEUTROABS 3.2  --   --   --   HGB 11.3* 10.9* 9.2* 9.9*  HCT 35.4* 32.0* 29.3* 32.2*  MCV 95.2  --  94.5 95.3  PLT 202  --  181 [id number]    Basic Metabolic Panel:  Recent Labs Lab 07/31/16 1807 07/31/16 1946 08/01/16 0546 08/02/16 0646  NA 141 143 142 143  K 3.2* 2.9* 3.7 3.9  CL 99* 99* 108 107  CO2 31  --  29 30  GLUCOSE 86 91 75 71  BUN 23* 23* 17 15  CREATININE 1.47* 1.40* 1.31* 1.46*  CALCIUM 8.9  --  8.1* 8.7*    GFR: Estimated Creatinine Clearance: 27.4 mL/min (by C-G formula based on SCr of 1.46 mg/dL (H)).  Liver Function Tests:  Recent Labs Lab 07/31/16 1807  AST 20  ALT 12*  ALKPHOS 78  BILITOT  0.4  PROT 7.4  ALBUMIN 3.0*    Coagulation Profile:  Recent Labs Lab 08/01/16 0546  INR 1.07      Recent Results (from the past 240 hour(s))  Urine culture     Status: Abnormal (Preliminary result)   Collection Time: 07/31/16  6:18 PM  Result Value Ref Range Status   Specimen Description URINE, CATHETERIZED  Final   Special Requests NONE  Final   Culture 60,000 COLONIES/mL STAPHYLOCOCCUS AUREUS (A)  Final   Report Status PENDING  Incomplete  MRSA PCR Screening     Status: None   Collection Time: 08/01/16 12:13 AM  Result Value Ref Range Status   MRSA by PCR NEGATIVE NEGATIVE Final    Comment:        The GeneXpert MRSA Assay (FDA approved for NASAL specimens only), is one component of a comprehensive MRSA colonization surveillance program. It is not intended to diagnose MRSA infection nor to  guide or monitor treatment for MRSA infections.       Radiology Studies: Dg Chest 2 View  Result Date: 07/31/2016 CLINICAL DATA:  Altered mental status.  Seizure activity. EXAM: CHEST  2 VIEW COMPARISON:  12/23/2015 FINDINGS: Lateral view degraded by patient arm position. Artifact degraded lateral view posteriorly. Moderate right hemidiaphragm elevation. Chin overlies the apices. Patient rotated to the right on the frontal. Midline trachea. Mild cardiomegaly with a tortuous thoracic aorta. No pleural effusion or pneumothorax. Remote left rib trauma. Bibasilar scarring or atelectasis. No congestive failure. IMPRESSION: No acute cardiopulmonary disease. Cardiomegaly without congestive failure. Electronically Signed   By: Abigail Miyamoto M.D.   On: 07/31/2016 19:20   Ct Head Wo Contrast  Result Date: 07/31/2016 CLINICAL DATA:  Seizure activity. Altered mental status. History of dementia. EXAM: CT HEAD WITHOUT CONTRAST TECHNIQUE: Contiguous axial images were obtained from the base of the skull through the vertex without intravenous contrast. COMPARISON:  07/04/2016 FINDINGS: Brain: Cerebral atrophy for age. Moderate low density in the periventricular white matter likely related to small vessel disease. Cerebellar atrophy is also age advanced. No mass lesion, hemorrhage, hydrocephalus, acute infarct, intra-axial, or extra-axial fluid collection. Vascular: Bilateral intracranial carotid and left vertebral artery atherosclerosis. Skull: Normal Sinuses/Orbits: Normal orbits and globes. Clear paranasal sinuses and mastoid air cells. Other: None. IMPRESSION: 1.  No acute intracranial abnormality. 2.  Cerebral atrophy and small vessel ischemic change. Electronically Signed   By: Abigail Miyamoto M.D.   On: 07/31/2016 19:23     Medications:  Scheduled: . amLODipine  5 mg Oral Daily  . ceFEPime (MAXIPIME) IV  1 g Intravenous Q24H  . divalproex  250 mg Oral TID  . memantine  28 mg Oral Daily   And  . donepezil   10 mg Oral Daily  . escitalopram  10 mg Oral Daily  . folic acid  1 mg Oral Daily  . multivitamin with minerals  1 tablet Oral Daily  . QUEtiapine  12.5 mg Oral BID  . rivaroxaban  20 mg Oral Q supper  . thiamine  100 mg Oral Daily   Continuous:  [id number] **OR** acetaminophen, LORazepam, ondansetron **OR** ondansetron (ZOFRAN) IV, traMADol  Assessment/Plan:  Active Problems:   Essential hypertension   Hypokalemia   UTI (urinary tract infection)    UTI Patient has a previous history of Pseudomonas UTI. She is currently on cefepime. Await culture data. Lactic acid level was normal. WBC was also noted to be normal. No clear evidence for sepsis.  Acute encephalopathy This is in the  setting of known dementia. Could've had worsening confusion due to the infection. Currently seems stable. CT scan of the head did not show any acute findings. Atrophy was noted. Continue to monitor.  Loose Stools Patient had 4 episodes of loose stools yesterday. These were noted to be black in color by the nurse. Patient is noted to be on iron tablets at home, which could be the reason for the same. Hemoglobin stable this morning. Unlikely to be GI bleed. Continue to monitor for now.  Stage II decubitus on buttock Wound care consult.  Essential hypertension Continue amlodipine. Monitor blood pressures closely.  Acute on likely chronic kidney disease, unclear stage Creatinine remains stable. Continue to monitor urine output. She might have been mildly dehydrated at the time of admission. Avoid nephrotoxic agents.  Normocytic anemia Hemoglobin is close to her baseline. No evidence for overt bleeding. Continue to monitor.  Dementia Continue Memantine/Donepezil  Anxiety/Depression Not very communicative. Flat affect. Continue Lexapro.  History of DVT On Xarelto  History of EtOH abuse Continue MVI, thiamine, folate.   DVT Prophylaxis: On oral anticoagulation    Code Status: Full  code  Family Communication: No family at bedside  Disposition Plan: Management as outlined above.    LOS: 2 days   [facility] Pager [phone] 08/02/2016, 11:14 AM  If 7PM-7AM, please contact night-coverage at [email or web address], password [facility]

## 2016-08-03 ENCOUNTER — Inpatient Hospital Stay (HOSPITAL_COMMUNITY): Payer: Medicare Other

## 2016-08-03 LAB — CBC
HCT: 31.3 % — ABNORMAL LOW (ref 36.0–46.0)
HEMOGLOBIN: 10.2 g/dL — AB (ref 12.0–15.0)
MCH: 30.3 pg (ref 26.0–34.0)
MCHC: 32.6 g/dL (ref 30.0–36.0)
MCV: 92.9 fL (ref 78.0–100.0)
PLATELETS: 214 10*3/uL (ref 150–400)
RBC: 3.37 MIL/uL — AB (ref 3.87–5.11)
RDW: 12.7 % (ref 11.5–15.5)
WBC: 6.5 10*3/uL (ref 4.0–10.5)

## 2016-08-03 LAB — BASIC METABOLIC PANEL
ANION GAP: 5 (ref 5–15)
BUN: 15 mg/dL (ref 6–20)
CHLORIDE: 109 mmol/L (ref 101–111)
CO2: 27 mmol/L (ref 22–32)
Calcium: 8.7 mg/dL — ABNORMAL LOW (ref 8.9–10.3)
Creatinine, Ser: 1.42 mg/dL — ABNORMAL HIGH (ref 0.44–1.00)
GFR, EST AFRICAN AMERICAN: 43 mL/min — AB (ref >60)
GFR, EST NON AFRICAN AMERICAN: 37 mL/min — AB (ref >60)
Glucose, Bld: 89 mg/dL (ref 65–99)
POTASSIUM: 3.5 mmol/L (ref 3.5–5.1)
SODIUM: 141 mmol/L (ref 135–145)

## 2016-08-03 LAB — URINE CULTURE

## 2016-08-03 MED ORDER — VANCOMYCIN HCL 500 MG IV SOLR
500.0000 mg | INTRAVENOUS | Status: DC
  Administered 2016-08-04 – 2016-08-05 (×2): 500 mg via INTRAVENOUS
  Filled 2016-08-03 (×3): qty 500

## 2016-08-03 MED ORDER — VANCOMYCIN HCL IN DEXTROSE 1-5 GM/200ML-% IV SOLN
1000.0000 mg | Freq: Once | INTRAVENOUS | Status: AC
  Administered 2016-08-03: 1000 mg via INTRAVENOUS
  Filled 2016-08-03: qty 200

## 2016-08-03 NOTE — NC FL2 (Signed)
[place] LEVEL OF CARE SCREENING TOOL     IDENTIFICATION  Patient Name: Tara Shaffer Birthdate: 03/04/1946 Sex: female Admission Date (Current Location): 07/31/2016  [facility] and [place] Number:  Herbalist and Address:  [facility], [place] [address], [place], [place] [address]      Provider Number: [id number]  Attending Physician Name and Address:  Bonnielee Haff, MD  Relative Name and Phone Number:  Danara Bigley (son) [phone]    Current Level of Care: SNF Recommended Level of Care: [place] Prior Approval Number:    Date Approved/Denied:   PASRR Number: [id number]  Discharge Plan: SNF    Current Diagnoses: Patient Active Problem List   Diagnosis Date Noted  . UTI (urinary tract infection) 12/24/2015  . SIRS (systemic inflammatory response syndrome) ([place]) 12/24/2015  . AKI (acute kidney injury) ([place]) 12/23/2015  . Fracture of femoral neck, right ([place]) 12/23/2015  . Anemia of chronic disease 08/29/2015  . CKD (chronic kidney disease) stage 2, GFR 60-89 ml/min 08/29/2015  . Closed wound of head 08/25/2015  . Mood disorder ([place]) 06/11/2015  . Right leg swelling 03/21/2015  . DVT, femoral, chronic ([place]) 03/20/2015  . Pressure ulcer 03/14/2015  . Malnutrition of moderate degree ([place]) 03/14/2015  . Closed right hip fracture ([place]) 03/13/2015  . Recurrent falls 10/31/2014  . Impaired glucose tolerance 10/31/2014  . Scalp laceration 08/04/2014  . Posterior pain of right hip 12/19/2012  . 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 Hypokalemia 03/21/2012  . Alcohol abuse 02/27/2012  . Insomnia 09/17/2011  . Dementia with behavioral disturbance 06/23/2011  . Preventative health care 02/01/2011  . ANXIETY DEPRESSION 06/12/2009  . ELECTROCARDIOGRAM, ABNORMAL 08/20/2008  . 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 DIVERTICULOSIS, COLON 08/04/2007  .  MELANOMA, HX OF 08/04/2007  . HLD (hyperlipidemia) 06/24/2007  . Morbid obesity ([place]) 06/23/2007  . Anxiety state 06/23/2007  . Essential hypertension 06/23/2007  . ALLERGIC RHINITIS 06/23/2007  . GERD 06/23/2007  . Irritable bowel syndrome 06/23/2007  . POSTMENOPAUSAL STATUS 06/23/2007  . COLONIC POLYPS, HX OF 03/08/2002    Orientation RESPIRATION BLADDER Height & Weight     Self  Normal Incontinent Weight: 117 lb 1.6 oz (53.1 kg) Height:  5\' 1"  (154.9 cm)  BEHAVIORAL SYMPTOMS/MOOD NEUROLOGICAL BOWEL NUTRITION STATUS  Other (Comment) (Impulsive)   Incontinent Diet (Heart Healthy)  AMBULATORY STATUS COMMUNICATION OF NEEDS Skin   Extensive Assist Verbally Other (Comment) (Skin tear right lateral leg; MSAD; fissure on sacrum)                       Personal Care Assistance Level of Assistance  Dressing, Feeding, Bathing Bathing Assistance: Maximum assistance Feeding assistance: Independent (needs set up) Dressing Assistance: Maximum assistance     Functional Limitations Info  Sight, Hearing, Speech Sight Info: Adequate Hearing Info: Adequate Speech Info: Adequate    SPECIAL CARE FACTORS FREQUENCY  PT (By licensed PT)     PT Frequency: Min 2X/week              Contractures Contractures Info: Present    Additional Factors Info  Code Status, Allergies Code Status Info: Full Code Allergies Info: Codeine Phosphate           Current Medications (08/03/2016):  This is the current hospital active medication list Current Facility-Administered Medications  Medication Dose Route Frequency Provider Last Rate Last Dose  . acetaminophen (TYLENOL) tablet 650  mg  650 mg Oral Q6H PRN Lily Kocher, MD       Or  . acetaminophen (TYLENOL) suppository 650 mg  650 mg Rectal Q6H PRN Lily Kocher, MD      . amLODipine (NORVASC) tablet 5 mg  5 mg Oral Daily Lily Kocher, MD   5 mg at 08/03/16 1110  . divalproex (DEPAKOTE SPRINKLE) capsule 250 mg  250 mg Oral TID Lily Kocher,  MD   250 mg at 08/03/16 1152  . memantine (NAMENDA XR) 24 hr capsule 28 mg  28 mg Oral Daily Lily Kocher, MD   28 mg at 08/03/16 1152   And  . donepezil (ARICEPT) tablet 10 mg  10 mg Oral Daily Lily Kocher, MD   10 mg at 08/03/16 1109  . escitalopram (LEXAPRO) tablet 10 mg  10 mg Oral Daily Lily Kocher, MD   10 mg at 08/03/16 1108  . folic acid (FOLVITE) tablet 1 mg  1 mg Oral Daily Lily Kocher, MD   1 mg at 08/03/16 1111  . LORazepam (ATIVAN) tablet 0.5 mg  0.5 mg Oral Q8H PRN Bonnielee Haff, MD      . multivitamin with minerals tablet 1 tablet  1 tablet Oral Daily Lily Kocher, MD   1 tablet at 08/03/16 1110  . ondansetron (ZOFRAN) tablet 4 mg  4 mg Oral Q6H PRN Lily Kocher, MD       Or  . ondansetron [facility]) injection 4 mg  4 mg Intravenous Q6H PRN Lily Kocher, MD      . QUEtiapine (SEROQUEL) tablet 12.5 mg  12.5 mg Oral BID Lily Kocher, MD   12.5 mg at 08/03/16 1109  . rivaroxaban (XARELTO) tablet 20 mg  20 mg Oral Q supper Lily Kocher, MD   20 mg at 08/02/16 1651  . thiamine (VITAMIN B-1) tablet 100 mg  100 mg Oral Daily Lily Kocher, MD   100 mg at 08/03/16 1110  . traMADol (ULTRAM) tablet 50 mg  50 mg Oral Q6H PRN Bonnielee Haff, MD         Discharge Medications: Please see discharge summary for a list of discharge medications.  Relevant Imaging Results:  Relevant Lab Results:   Additional Information SSN:  [id number]  Lajoyce Lauber Work [phone]

## 2016-08-03 NOTE — Progress Notes (Signed)
Pharmacy Antibiotic Note  Tara Shaffer is a 70 y.o. female admitted on 07/31/2016 with mental status changes concerning for a UTI. The patient is now noted to have a MRSA UTI and Pharmacy has been consulted for Vancomycin dosing while evaluating for a source. ECHO and BCx have been ordered.   Plan: 1. Vancomycin 1g IV x 1 dose now followed by 500 mg IV every 24 hours 2. Will continue to follow renal function, culture results, LOT, and antibiotic de-escalation plans   Height: 5\' 1"  (154.9 cm) Weight: 117 lb 1.6 oz (53.1 kg) IBW/kg (Calculated) : 47.8  Temp (24hrs), Avg:98.4 F (36.9 C), Min:97.9 F (36.6 C), Max:98.7 F (37.1 C)   Recent Labs Lab 07/31/16 1807 07/31/16 1946 07/31/16 2124 08/01/16 0546 08/02/16 0646 08/03/16 0519  WBC 6.1  --   --  5.4 5.6 6.5  CREATININE 1.47* 1.40*  --  1.31* 1.46* 1.42*  LATICACIDVEN  --   --  0.64  --   --   --     Estimated Creatinine Clearance: 28.2 mL/min (by C-G formula based on SCr of 1.42 mg/dL (H)).    Allergies  Allergen Reactions  . Codeine Phosphate Nausea Only    Antimicrobials this admission:  Cefepime 11/4 >> 11/7 Vancomycin 11/7 >>  Dose adjustments this admission:  [name]  Microbiology results:  11/4 UCx: 60k MRSA 11/5 MRSA PCR: neg 11/7 BCx >>  Thank you for allowing pharmacy to be a part of this patient's care.  Alycia Rossetti, PharmD, BCPS Clinical Pharmacist Pager: [phone] Clinical phone for 08/03/2016 from 7a-3:30p: [phone] If after 3:30p, please call main pharmacy at: [phone] 08/03/2016 1:28 PM

## 2016-08-03 NOTE — Progress Notes (Signed)
[facility] PROGRESS NOTE  Tara Shaffer [id number] DOB: April 21, 1946 DOA: 07/31/2016  PCP: [name], MD  Brief History/Interval Summary: 70 year old Caucasian female with a past medical history of dementia, hypertension, hyperlipidemia, history of DVT on anticoagulation, anxiety and depression who was brought in due to mental status changes and involuntary shaking episodes. Patient was noted to have an abnormal UA. She was hospitalized for further management.  Reason for Visit: Urinary tract infection and acute encephalopathy  Consultants: None  Procedures: None  Antibiotics: Cefepime stopped on 11/7 Vancomycin 11/7  Subjective/Interval History: Patient not very communicative. Denies any pain, nausea or vomiting. No further episodes of loose stools.  ROS: Denies any nausea or vomiting  Objective:  Vital Signs  Vitals:   08/02/16 0900 08/02/16 1659 08/02/16 2146 08/03/16 0435  BP: (!) 105/54 123/64 113/66 (!) 108/53  Pulse: 60 (!) 58 (!) 56 (!) 57  Resp: 18 18 16 16   Temp: 98.7 F (37.1 C) 97.9 F (36.6 C) 98.2 F (36.8 C) 98.6 F (37 C)  TempSrc: Oral Oral Oral Oral  SpO2: 95% 95% 100% 95%  Weight:      Height:        Intake/Output Summary (Last 24 hours) at 08/03/16 0809 Last data filed at 08/03/16 [id number]  Gross per 24 hour  Intake              530 ml  Output                0 ml  Net              530 ml   Filed Weights   07/31/16 1714 07/31/16 2216 08/01/16 2154  Weight: 62.1 kg (137 lb) 51 kg (112 lb 8 oz) 53.1 kg (117 lb 1.6 oz)    General appearance: alert, appears stated age, distracted and no distress Resp: clear to auscultation bilaterally Cardio: regular rate and rhythm, S1, S2 normal, no murmur, click, rub or gallop GI: soft, Vague diffuse tenderness without any rebound, rigidity or guarding. no masses,  no organomegaly Extremities: Dry wound noted in the lower extremity. No redness. No drainage noted. Neurologic: Awake and alert.  Distracted at times. No facial asymmetry. Moving all her extremities.  Lab Results:  Data Reviewed: I have personally reviewed following labs and imaging studies  CBC:  Recent Labs Lab 07/31/16 1807 07/31/16 1946 08/01/16 0546 08/02/16 0646 08/03/16 0519  WBC 6.1  --  5.4 5.6 6.5  NEUTROABS 3.2  --   --   --   --   HGB 11.3* 10.9* 9.2* 9.9* 10.2*  HCT 35.4* 32.0* 29.3* 32.2* 31.3*  MCV 95.2  --  94.5 95.3 92.9  PLT 202  --  181 212 [id number]    Basic Metabolic Panel:  Recent Labs Lab 07/31/16 1807 07/31/16 1946 08/01/16 0546 08/02/16 0646 08/03/16 0519  NA 141 143 142 143 141  K 3.2* 2.9* 3.7 3.9 3.5  CL 99* 99* 108 107 109  CO2 31  --  29 30 27   GLUCOSE 86 91 75 71 89  BUN 23* 23* 17 15 15   CREATININE 1.47* 1.40* 1.31* 1.46* 1.42*  CALCIUM 8.9  --  8.1* 8.7* 8.7*    GFR: Estimated Creatinine Clearance: 28.2 mL/min (by C-G formula based on SCr of 1.42 mg/dL (H)).  Liver Function Tests:  Recent Labs Lab 07/31/16 1807  AST 20  ALT 12*  ALKPHOS 78  BILITOT 0.4  PROT 7.4  ALBUMIN 3.0*  Coagulation Profile:  Recent Labs Lab 08/01/16 0546  INR 1.07      Recent Results (from the past 240 hour(s))  Urine culture     Status: Abnormal   Collection Time: 07/31/16  6:18 PM  Result Value Ref Range Status   Specimen Description URINE, CATHETERIZED  Final   Special Requests NONE  Final   Culture (A)  Final    60,000 COLONIES/mL METHICILLIN RESISTANT STAPHYLOCOCCUS AUREUS   Report Status 08/03/2016 FINAL  Final   Organism ID, Bacteria METHICILLIN RESISTANT STAPHYLOCOCCUS AUREUS (A)  Final      Susceptibility   Methicillin resistant staphylococcus aureus - MIC*    CIPROFLOXACIN >=8 RESISTANT Resistant     GENTAMICIN <=0.5 SENSITIVE Sensitive     NITROFURANTOIN <=16 SENSITIVE Sensitive     OXACILLIN >=4 RESISTANT Resistant     TETRACYCLINE <=1 SENSITIVE Sensitive     VANCOMYCIN 1 SENSITIVE Sensitive     TRIMETH/SULFA >=320 RESISTANT Resistant      CLINDAMYCIN <=0.25 SENSITIVE Sensitive     RIFAMPIN <=0.5 SENSITIVE Sensitive     Inducible Clindamycin NEGATIVE Sensitive     * 60,000 COLONIES/mL METHICILLIN RESISTANT STAPHYLOCOCCUS AUREUS  MRSA PCR Screening     Status: None   Collection Time: 08/01/16 12:13 AM  Result Value Ref Range Status   MRSA by PCR NEGATIVE NEGATIVE Final    Comment:        The GeneXpert MRSA Assay (FDA approved for NASAL specimens only), is one component of a comprehensive MRSA colonization surveillance program. It is not intended to diagnose MRSA infection nor to guide or monitor treatment for MRSA infections.       Radiology Studies: No results found.   Medications:  Scheduled: . amLODipine  5 mg Oral Daily  . ceFEPime (MAXIPIME) IV  1 g Intravenous Q24H  . divalproex  250 mg Oral TID  . memantine  28 mg Oral Daily   And  . donepezil  10 mg Oral Daily  . escitalopram  10 mg Oral Daily  . folic acid  1 mg Oral Daily  . multivitamin with minerals  1 tablet Oral Daily  . QUEtiapine  12.5 mg Oral BID  . rivaroxaban  20 mg Oral Q supper  . thiamine  100 mg Oral Daily   Continuous:  [id number] **OR** acetaminophen, LORazepam, ondansetron **OR** ondansetron (ZOFRAN) IV, traMADol  Assessment/Plan:  Active Problems:   Essential hypertension   Hypokalemia   UTI (urinary tract infection)    UTI With MRSA Unusual finding. We'll obtain blood cultures and echocardiogram. No obvious source of infection noted on her skin. She does have moisture associated skin damage in the perineal area. However, no evidence for infection in that area. She does have mild tenderness in the abdomen. Proceed with CT scan of the abdomen and pelvis as well. Chest x-ray done on November 4 did not show any acute findings.   Acute encephalopathy This is in the setting of known dementia. Could've had worsening confusion due to the infection. Currently seems stable. CT scan of the head did not show any acute  findings. Atrophy was noted. Continue to monitor.  Loose Stools Patient had 4 episodes of loose stools 11/5. These were noted to be black in color by the nurse. Patient is noted to be on iron tablets at home, which could be the reason for the same. Hemoglobin stable. Unlikely to be GI bleed. Continue to monitor for now. Has not had any further episodes of loose stools.  Moisture associated skin injury on buttock Wound care consult.  Essential hypertension Continue amlodipine. Monitor blood pressures closely.  Acute on likely chronic kidney disease, unclear stage Creatinine remains stable. Continue to monitor urine output. She might have been mildly dehydrated at the time of admission. Avoid nephrotoxic agents.  Normocytic anemia Hemoglobin is close to her baseline. No evidence for overt bleeding. Continue to monitor.  Dementia Continue Memantine/Donepezil  Anxiety/Depression Not very communicative. Flat affect. Continue Lexapro.  History of DVT On Xarelto  History of EtOH abuse Continue MVI, thiamine, folate.   DVT Prophylaxis: On oral anticoagulation    Code Status: Full code  Family Communication: No family at bedside  Disposition Plan: Management as outlined above. Await blood cultures, echocardiogram and CT scan of the abdomen and pelvis.    LOS: 3 days   [facility] Pager [phone] 08/03/2016, 8:09 AM  If 7PM-7AM, please contact night-coverage at [email or web address], password [facility]

## 2016-08-03 NOTE — Consult Note (Signed)
[facility] CM Primary Care Navigator  08/03/2016  Tara Shaffer 08/07/1946 [id number]   Met with patient to identify possible discharge needs.  Patient has flat affect and not very communicative. Has history of dementia. Patient is a long term resident at [facility]. Facility staff were worried about involuntary shaking and mental status change that had led to this admission. Patient confirms Dr. [name] with [facility]) as her primary care provider. Facility staff manages medications, and provides transportation to appointments per patient.   Patient confirms that [employer] serve as primary caregivers and provide care for her needs.    According to patient, discharge plan is to go back to [facility] facility.  Patient expressed understanding to see primary care provider once discharged, for a post discharge follow-up appointment within a week or sooner if needed.   For additional questions please contact:  Edwena Felty A. Ingra Rother, BSN, RN-BC [facility] PRIMARY CARE Navigator  Cell: [phone]

## 2016-08-04 ENCOUNTER — Other Ambulatory Visit (HOSPITAL_COMMUNITY): Payer: 59

## 2016-08-04 DIAGNOSIS — E876 Hypokalemia: Secondary | ICD-10-CM

## 2016-08-04 DIAGNOSIS — N3000 Acute cystitis without hematuria: Secondary | ICD-10-CM

## 2016-08-04 DIAGNOSIS — I10 Essential (primary) hypertension: Secondary | ICD-10-CM

## 2016-08-04 LAB — BASIC METABOLIC PANEL
Anion gap: 7 (ref 5–15)
BUN: 10 mg/dL (ref 6–20)
CALCIUM: 8.7 mg/dL — AB (ref 8.9–10.3)
CO2: 27 mmol/L (ref 22–32)
CREATININE: 1.15 mg/dL — AB (ref 0.44–1.00)
Chloride: 107 mmol/L (ref 101–111)
GFR calc non Af Amer: 47 mL/min — ABNORMAL LOW (ref >60)
GFR, EST AFRICAN AMERICAN: 55 mL/min — AB (ref >60)
Glucose, Bld: 87 mg/dL (ref 65–99)
Potassium: 3.4 mmol/L — ABNORMAL LOW (ref 3.5–5.1)
SODIUM: 141 mmol/L (ref 135–145)

## 2016-08-04 LAB — CBC
HCT: 31.1 % — ABNORMAL LOW (ref 36.0–46.0)
Hemoglobin: 10.2 g/dL — ABNORMAL LOW (ref 12.0–15.0)
MCH: 30.4 pg (ref 26.0–34.0)
MCHC: 32.8 g/dL (ref 30.0–36.0)
MCV: 92.6 fL (ref 78.0–100.0)
Platelets: 221 10*3/uL (ref 150–400)
RBC: 3.36 MIL/uL — ABNORMAL LOW (ref 3.87–5.11)
RDW: 12.9 % (ref 11.5–15.5)
WBC: 5.8 10*3/uL (ref 4.0–10.5)

## 2016-08-04 MED ORDER — POTASSIUM CHLORIDE CRYS ER 20 MEQ PO TBCR
40.0000 meq | EXTENDED_RELEASE_TABLET | Freq: Four times a day (QID) | ORAL | Status: AC
  Administered 2016-08-04 (×2): 40 meq via ORAL
  Filled 2016-08-04 (×2): qty 2

## 2016-08-04 NOTE — Progress Notes (Addendum)
PROGRESS NOTE    Tara Shaffer  [id number] DOB: 25-Feb-1946 DOA: 07/31/2016 PCP: [name], MD   Subjective: Patient is awake and alert. Oriented to person but not place and time. Denies abdominal pain and dysuria. Does not recall having fever, chills, hematuria, pain or a BM. Patient has decreased appetite, not eating breakfast.   Brief Narrative:  70 year old Caucasian female with a past medical history of dementia, hypertension, hyperlipidemia, history of DVT on anticoagulation, anxiety and depression who was brought in due to mental status changes and involuntary shaking episodes. Patient was noted to have an abnormal UA and potassium of 3.2. EKG, troponin, head CT, and CXR were not concerning for acute processes. She was initially given cefepime. Urine culture grew MRSA and was switched to vancomycin.    Assessment & Plan:   Active Problems:   Essential hypertension   Hypokalemia   UTI (urinary tract infection)   MRSA UTI - Patient has a previous history of Pseudomonas UTI.  - Lactic acid 11/4 level was 0.64, WBC 11/8 is 5.8. No clear evidence for sepsis. - Urine culture 11/4 positive for MRSA, MRSA PCR negative - Cefepime discontinued 11/7, now on vancomycin - Unusual cause of UTI, blood cultures and echo ordered. We'll target 5-7 days of antibiotics  Bilateral hydroureteronephrosis -CT scan showed severe right and moderate left hydroureteronephrosis. -No evidence of nephrolithiasis, there is bladder wall thickening which can cause the above. -Likely can benefit from urology follow-up as outpatient.  Acute encephalopathy - This is in the setting of known dementia. Could've had worsening confusion due to the infection.  - Currently seems stable.  - CT scan of the head 11/7 did not show any acute findings. Atrophy was noted.  - Echo pending. - Continue to monitor. Has history of dementia, unsure what is her baseline, this could be it.  Hypokalemia - Potassium  3.2 on admission, given K dur 11/4 and 11/5.  - Potassium is 3.4 today, will replete prn   Loose Stools - Patient had 4 episodes of loose stools 11/5. These were noted to be black in color by the nurse.  - Patient is noted to be on iron tablets at home, which could be the reason for the same.  - Patient cannot recall last BM - Hemoglobin stable at 10.2 this morning. Unlikely to be GI bleed.  - Continue to monitor for now.  Stage II decubitus on buttock - Wound care consult.  Essential hypertension - Continue amlodipine. BP 123/63 today. Monitor blood pressures closely.  Acute on likely chronic kidney disease, unclear stage - Creatinine is decreasing, 1.15 today. Continue to monitor urine output. She is around baseline.  Normocytic anemia - Hemoglobin is 10.2. No evidence for overt bleeding.  - Continue to monitor.  Dementia - Continue Memantine/Donepezil  Anxiety/Depression - Not very communicative. Flat affect. Continue Lexapro.  History of DVT - On Xarelto  History of EtOH abuse - Continue MVI, thiamine, folate.   DVT prophylaxis: Xarelto Code Status: Full code Family Communication: None at bedside Disposition Plan: She is from ALF, PT recommended SNF, CSW to help with placement   Consultants:   None  Procedures:  Echo pending  Antimicrobials:   Cefepime 07/31/16-08/03/16  Vancomycin 08/04/16 >>>     Objective: Vitals:   08/03/16 0900 08/03/16 1700 08/03/16 2049 08/04/16 0500  BP: 114/65 (!) 109/55 126/67 120/63  Pulse: 64 65 64 (!) 53  Resp: 16 18 16 16   Temp: 98.6 F (37 C) 98.2 F (36.8 C)  98.4 F (36.9 C) 97.6 F (36.4 C)  TempSrc: Oral Oral Oral Oral  SpO2: 96% 96% 95% 99%  Weight:   52.2 kg (115 lb)   Height:   5\' 5"  (1.651 m)     Intake/Output Summary (Last 24 hours) at 08/04/16 1008 Last data filed at 08/04/16 0930  Gross per 24 hour  Intake              340 ml  Output                0 ml  Net              340 ml    Filed Weights   07/31/16 2216 08/01/16 2154 08/03/16 2049  Weight: 51 kg (112 lb 8 oz) 53.1 kg (117 lb 1.6 oz) 52.2 kg (115 lb)    Examination:  General exam: Appears calm and comfortable. Awake and alert. No acute distress or gross abnormalities Respiratory system: Clear to auscultation. Respiratory effort normal. Cardiovascular system: S1 & S2 heard, RRR. No JVD, murmurs, rubs, gallops or clicks. No pedal edema. Gastrointestinal system: Abdomen is nondistended, soft and nontender. No organomegaly or masses felt. Normal bowel sounds heard. Central nervous system: Alert and oriented x1. No focal neurological deficits. Psychiatry: Judgement and insight appear abnormal due to dementia.      Data Reviewed: I have personally reviewed following labs and imaging studies  CBC:  Recent Labs Lab 07/31/16 1807 07/31/16 1946 08/01/16 0546 08/02/16 0646 08/03/16 0519 08/04/16 0601  WBC 6.1  --  5.4 5.6 6.5 5.8  NEUTROABS 3.2  --   --   --   --   --   HGB 11.3* 10.9* 9.2* 9.9* 10.2* 10.2*  HCT 35.4* 32.0* 29.3* 32.2* 31.3* 31.1*  MCV 95.2  --  94.5 95.3 92.9 92.6  PLT 202  --  181 212 214 [id number]   Basic Metabolic Panel:  Recent Labs Lab 07/31/16 1807 07/31/16 1946 08/01/16 0546 08/02/16 0646 08/03/16 0519 08/04/16 0601  NA 141 143 142 143 141 141  K 3.2* 2.9* 3.7 3.9 3.5 3.4*  CL 99* 99* 108 107 109 107  CO2 31  --  29 30 27 27   GLUCOSE 86 91 75 71 89 87  BUN 23* 23* 17 15 15 10   CREATININE 1.47* 1.40* 1.31* 1.46* 1.42* 1.15*  CALCIUM 8.9  --  8.1* 8.7* 8.7* 8.7*   GFR: Estimated Creatinine Clearance: 38 mL/min (by C-G formula based on SCr of 1.15 mg/dL (H)). Liver Function Tests:  Recent Labs Lab 07/31/16 1807  AST 20  ALT 12*  ALKPHOS 78  BILITOT 0.4  PROT 7.4  ALBUMIN 3.0*   No results for input(s): LIPASE, AMYLASE in the last 168 hours. No results for input(s): AMMONIA in the last 168 hours. Coagulation Profile:  Recent Labs Lab 08/01/16 0546  INR  1.07   Cardiac Enzymes: No results for input(s): CKTOTAL, CKMB, CKMBINDEX, TROPONINI in the last 168 hours. BNP (last 3 results) No results for input(s): PROBNP in the last 8760 hours. HbA1C: No results for input(s): HGBA1C in the last 72 hours. CBG: No results for input(s): GLUCAP in the last 168 hours. Lipid Profile: No results for input(s): CHOL, HDL, LDLCALC, TRIG, CHOLHDL, LDLDIRECT in the last 72 hours. Thyroid Function Tests: No results for input(s): TSH, T4TOTAL, FREET4, T3FREE, THYROIDAB in the last 72 hours. Anemia Panel: No results for input(s): VITAMINB12, FOLATE, FERRITIN, TIBC, IRON, RETICCTPCT in the last 72 hours. Sepsis Labs:  Recent Labs Lab 07/31/16 2124  LATICACIDVEN 0.64    Recent Results (from the past 240 hour(s))  Urine culture     Status: Abnormal   Collection Time: 07/31/16  6:18 PM  Result Value Ref Range Status   Specimen Description URINE, CATHETERIZED  Final   Special Requests NONE  Final   Culture (A)  Final    60,000 COLONIES/mL METHICILLIN RESISTANT STAPHYLOCOCCUS AUREUS   Report Status 08/03/2016 FINAL  Final   Organism ID, Bacteria METHICILLIN RESISTANT STAPHYLOCOCCUS AUREUS (A)  Final      Susceptibility   Methicillin resistant staphylococcus aureus - MIC*    CIPROFLOXACIN >=8 RESISTANT Resistant     GENTAMICIN <=0.5 SENSITIVE Sensitive     NITROFURANTOIN <=16 SENSITIVE Sensitive     OXACILLIN >=4 RESISTANT Resistant     TETRACYCLINE <=1 SENSITIVE Sensitive     VANCOMYCIN 1 SENSITIVE Sensitive     TRIMETH/SULFA >=320 RESISTANT Resistant     CLINDAMYCIN <=0.25 SENSITIVE Sensitive     RIFAMPIN <=0.5 SENSITIVE Sensitive     Inducible Clindamycin NEGATIVE Sensitive     * 60,000 COLONIES/mL METHICILLIN RESISTANT STAPHYLOCOCCUS AUREUS  MRSA PCR Screening     Status: None   Collection Time: 08/01/16 12:13 AM  Result Value Ref Range Status   MRSA by PCR NEGATIVE NEGATIVE Final    Comment:        The GeneXpert MRSA Assay (FDA approved  for NASAL specimens only), is one component of a comprehensive MRSA colonization surveillance program. It is not intended to diagnose MRSA infection nor to guide or monitor treatment for MRSA infections.          Radiology Studies: Ct Abdomen Pelvis Wo Contrast  Result Date: 08/03/2016 CLINICAL DATA:  Abdominal pain.  Altered mental status. EXAM: CT ABDOMEN AND PELVIS WITHOUT CONTRAST TECHNIQUE: Multidetector CT imaging of the abdomen and pelvis was performed following the standard protocol without IV contrast. COMPARISON:  Lumbar spine radiographs 12/19/2012. FINDINGS: Lower chest: Minimal atelectasis in the lung bases. Small calcified right hilar lymph nodes. Normal heart size. Hepatobiliary: The gallbladder is absent. No biliary dilatation is seen. No focal liver abnormality is identified within limitations of noncontrast technique and beam hardening from the patient's arms. Pancreas: Unremarkable. Spleen: Punctate calcified granuloma. Adrenals/Urinary Tract: Unremarkable adrenal glands. Moderate right greater than left hydronephrosis and moderate to severe right and moderate left diffuse hydroureter. No urinary tract calculi identified. Moderate right renal cortical thinning. Mild-to-moderate irregular wall thickening diffusely involving the bladder, which is mildly distended. Stomach/Bowel: The stomach is within normal limits. A small duodenal diverticulum is noted. There is no evidence of bowel obstruction or wall thickening. The appendix is absent. A moderate amount of stool is present in the rectal vault. Vascular/Lymphatic: Abdominal aortic atherosclerosis without aneurysm. No enlarged lymph nodes are identified. Reproductive: Unremarkable uterus and right ovary. A homogeneous lead mildly hyperattenuating left adnexal mass measures 4.6 x 3.9 cm. Other: No intraperitoneal free fluid. No abdominal wall mass or hernia. Musculoskeletal: Old left rib fractures. Chronic right femoral neck  fracture with superior displacement. Chronic sacral fracture at the S2-3 level with anterior angulation of the distal sacrum and coccyx. Mild superior endplate compression fractures at T12 and L2 are new from 2014 but may be chronic. IMPRESSION: 1. Diffuse bladder wall thickening which may reflect cystitis. 2. Moderate to severe right and moderate left hydroureteronephrosis. No obstructing calculi. This may be secondary to #1. 3. Mild T12 and L2 compression fractures, new from 2014 though of indeterminate acuity. 4. **  An incidental finding of potential clinical significance has been found. 4.6 cm left adnexal mass. Nonurgent evaluation with pelvic ultrasound is recommended.** 5. Aortic atherosclerosis. Electronically Signed   By: Logan Bores M.D.   On: 08/03/2016 18:52        Scheduled Meds: . amLODipine  5 mg Oral Daily  . divalproex  250 mg Oral TID  . memantine  28 mg Oral Daily   And  . donepezil  10 mg Oral Daily  . escitalopram  10 mg Oral Daily  . folic acid  1 mg Oral Daily  . multivitamin with minerals  1 tablet Oral Daily  . QUEtiapine  12.5 mg Oral BID  . rivaroxaban  20 mg Oral Q supper  . thiamine  100 mg Oral Daily  . vancomycin  500 mg Intravenous Q24H   Continuous Infusions:   LOS: 4 days    Time spent: [place], PA-S  Wrote some of this note. [facility] Pager [phone]  If 7PM-7AM, please contact night-coverage [email or web address] Password [email or web address] 08/04/2016, 10:08 AM   Birdie Hopes Pager: [phone] 08/04/2016, 12:07 PM

## 2016-08-04 NOTE — Care Management Important Message (Signed)
Important Message  Patient Details  Name: Tara Shaffer MRN: [id number] Date of Birth: Dec 05, 1945   Medicare Important Message Given:  Yes    Makila Colombe, Rory Percy, RN 08/04/2016, 1:43 PM

## 2016-08-05 ENCOUNTER — Other Ambulatory Visit (HOSPITAL_COMMUNITY): Payer: 59

## 2016-08-05 DIAGNOSIS — R296 Repeated falls: Secondary | ICD-10-CM

## 2016-08-05 DIAGNOSIS — I82519 Chronic embolism and thrombosis of unspecified femoral vein: Secondary | ICD-10-CM

## 2016-08-05 DIAGNOSIS — E440 Moderate protein-calorie malnutrition: Secondary | ICD-10-CM

## 2016-08-05 LAB — BASIC METABOLIC PANEL
Anion gap: 4 — ABNORMAL LOW (ref 5–15)
BUN: 8 mg/dL (ref 6–20)
CALCIUM: 8.5 mg/dL — AB (ref 8.9–10.3)
CO2: 24 mmol/L (ref 22–32)
CREATININE: 1.07 mg/dL — AB (ref 0.44–1.00)
Chloride: 112 mmol/L — ABNORMAL HIGH (ref 101–111)
GFR calc Af Amer: 60 mL/min — ABNORMAL LOW (ref >60)
GFR, EST NON AFRICAN AMERICAN: 52 mL/min — AB (ref >60)
GLUCOSE: 86 mg/dL (ref 65–99)
Potassium: 4.5 mmol/L (ref 3.5–5.1)
SODIUM: 140 mmol/L (ref 135–145)

## 2016-08-05 LAB — MAGNESIUM: MAGNESIUM: 2.4 mg/dL (ref 1.7–2.4)

## 2016-08-05 MED ORDER — DOXYCYCLINE HYCLATE 100 MG PO TABS: 100.0000 mg | ORAL_TABLET | Freq: Two times a day (BID) | ORAL | 0 refills | Status: DC

## 2016-08-05 MED ORDER — LORAZEPAM 0.5 MG PO TABS: 0.5000 mg | ORAL_TABLET | Freq: Three times a day (TID) | ORAL | 0 refills | Status: DC | PRN

## 2016-08-05 MED ORDER — TRAMADOL HCL 50 MG PO TABS: 50.0000 mg | ORAL_TABLET | Freq: Three times a day (TID) | ORAL | 0 refills | Status: DC

## 2016-08-05 NOTE — Progress Notes (Signed)
Patient to be discharged to [facility]. Left forearm IV taken out by this RN. Patient vitals stable.   Ermalinda Memos, RN

## 2016-08-05 NOTE — Progress Notes (Signed)
[facility] to have [facility] transport patient via ambulance to [facility].  Jillyn Ledger, MBA, BSN, RN

## 2016-08-05 NOTE — Clinical Social Work Placement (Addendum)
   CLINICAL SOCIAL WORK PLACEMENT  NOTE 08/05/16 - DISCHARGED TO [facility]  Date:  08/05/2016  Patient Details  Name: Tara Shaffer MRN: [id number] Date of Birth: 1946/01/18  Clinical Social Work is seeking post-discharge placement for this patient at the [facility] level of care (*CSW will initial, date and re-position this form in  chart as items are completed):  No (Son had facility preference)   Patient/family provided with [facility] Work Department's list of facilities offering this level of care within the geographic area requested by the patient (or if unable, by the patient's family).  Yes   Patient/family informed of their freedom to choose among providers that offer the needed level of care, that participate in Medicare, Medicaid or managed care program needed by the patient, have an available bed and are willing to accept the patient.  No   Patient/family informed of [facility]'s ownership interest in [facility] and [facility], as well as of the fact that they are under no obligation to receive care at these facilities.  PASRR submitted to EDS on 08/05/16     PASRR number received on 08/05/16 ([id number] A eff. 08/05/16)     Existing PASRR number confirmed on       FL2 transmitted to all facilities in geographic area requested by pt/family on 08/04/16     FL2 transmitted to all facilities within larger geographic area on       Patient informed that his/her managed care company has contracts with or will negotiate with certain facilities, including the following:         08/05/16 - Patient/family informed of bed offers received.  Patient's son chooses bed at [facility]   Physician recommends and patient chooses bed at      Patient to be transferred to [facility] on 08/05/16.  Patient to be transferred to facility by [facility] by ambulance     Patient family  notified on 08/05/16 of transfer.  Name of family member notified:  Delfino Lovett "[name]" Simkin - son by phone [phone]).     PHYSICIAN       Additional Comment:    _______________________________________________ Sable Feil, LCSW 08/05/2016, 12:19 PM

## 2016-08-05 NOTE — Clinical Social Work Note (Signed)
Clinical Social Work Assessment  Patient Details  Name: Tara Shaffer MRN: [id number] Date of Birth: 1946-06-15  Date of referral:  08/02/16               Reason for consult:  Facility Placement                Permission sought to share information with:  Family Supports Permission granted to share information::  No  Name::     Jobie Petrosyan or Delfino Lovett [name]) [place]::     Relationship::  Sons  Contact Information:  [name] [phone] (mobile) and [name] [phone] (mobile)  Housing/Transportation Living arrangements for the past 2 months:  [place] ([facility]) Source of Information:  Adult Children Maari Valencia) Patient Interpreter Needed:  None Criminal Activity/Legal Involvement Pertinent to Current Situation/Hospitalization:  No - Comment as needed Significant Relationships:  Adult Children Lives with:  Facility Resident Do you feel safe going back to the place where you live?  No (Patient needs ST rehab prior to returning to Assisted Living) Need for family participation in patient care:  Yes (Comment)  Care giving concerns: CSW explained to son regarding PT recommendation of ST rehab and he is agreeable to rehab before returning to ALF.   Social Worker assessment / plan:  CSW talked with Patrica Duel by phone regarding discharge disposition and recommendation of ST rehab. He is agreeable and requested [facility], and reported that patient has been there before.  Employment status:  Retired Forensic scientist:  [employer] PT Recommendations:  [place] / Referral to community resources:  [place] (Talked with son [name] regarding skilled facility placement and provided facility responses)  Patient/Family's Response to care:  No concerns reported regarding care during hospitalization.  Patient/Family's Understanding of and Emotional Response to Diagnosis, Current Treatment, and  Prognosis:  Not discussed.  Emotional Assessment Appearance:  Appears stated age Attitude/Demeanor/Rapport:  Unable to Assess (Did not talk with patient) Affect (typically observed):  Unable to Assess (Did not talk with patient) Orientation:  Oriented to Self, Oriented to Place Alcohol / Substance use:  Tobacco Use, Alcohol Use, Illicit Drugs (Patient does not smoke, consumes approx 25/2 oz of alcohol per week (history) and does not use any illicit drugs) Psych involvement (Current and /or in the community):  No (Comment)  Discharge Needs  Concerns to be addressed:  Discharge Planning Concerns Readmission within the last 30 days:  No Current discharge risk:  None Barriers to Discharge:  No Barriers Identified   Sable Feil, LCSW 08/05/2016, 12:11 PM

## 2016-08-05 NOTE — Progress Notes (Signed)
Report called to Nurse at [facility]. Pt is ready for transport.

## 2016-08-05 NOTE — Discharge Summary (Signed)
Physician Discharge Summary  Tara Shaffer [id number] DOB: Nov 13, 1945 DOA: 07/31/2016  PCP: [name], MD  Admit date: 07/31/2016 Discharge date: 08/05/2016  Admitted From: [facility] Disposition: SNF  Recommendations for Outpatient Follow-up:  1. Follow up with PCP in 1-2 weeks 2. Please obtain BMP/CBC in one week 3. Recommend protein shakes supplement patient nutrition. 4. Referral to urology as outpatient for bilateral hydroureteronephrosis, after 7 days to complete to UTI antibiotics.  Home Health: NA Equipment/Devices:NA  Discharge Condition: Stable CODE STATUS: Full Code Diet recommendation: Diet Heart Room service appropriate? Yes; Fluid consistency: Thin Diet - low sodium heart healthy  Brief/Interim Summary: 70 year old Caucasian female with a past medical history of ? EtOH related dementia, hypertension, hyperlipidemia, history of DVT on anticoagulation, anxiety and depression who was brought in due to mental status changes and involuntary shaking episodes. Patient was noted to have an abnormal UA and potassium of 3.2. EKG, troponin, head CT, and CXR were not concerning for acute processes. She was initially given cefepime. Urine culture grew MRSA and was switched to vancomycin.   Discharge Diagnoses:  Principal Problem:   UTI (urinary tract infection) Active Problems:   Essential hypertension   Hypokalemia   Recurrent falls   Malnutrition of moderate degree (HCC)   DVT, femoral, chronic (HCC)   MRSA UTI - Patient has a previous history of Pseudomonas UTI.  - Lactic acid 11/4 level was 0.64, WBC 11/8 is 5.8. No clear evidence for sepsis. - Urine culture 11/4 positive for MRSA, nasal swab MRSA PCR negative - Cefepime discontinued 11/7, started on vancomycin. - Unusual cause of UTI, blood cultures NGT, echo ordered but not done prior to discharge no suspicion for SBE. - Discharged on doxycycline for 7 more days.  Bilateral  hydroureteronephrosis -CT scan showed severe right and moderate left hydroureteronephrosis. -No evidence of nephrolithiasis, there is bladder wall thickening which can cause the above. -Refer to see urology as outpatient. She was incontinent of urine but without retention  Acute encephalopathy - This is in the setting of known dementia. Could've had worsening confusion due to the infection.  - Currently seems stable.  - CT scan of the head 11/7 did not show any acute findings. Atrophy was noted.  - She is fully awake and alert prior to discharge.  Hypokalemia - Potassium 3.2 on admission, given K dur 11/4 and 11/5.  - Repleted with oral supplements.  Loose Stools - Patient had 4 episodes of loose stools 11/5. These were noted to be black in color by the nurse.  - Patient is noted to be on iron tablets at home, which could be the reason for the same.  - On as needed laxatives and Imodium that could be the cause of loose stools.  Stage II decubitus on buttock - Wound care consult, known ulcer prior to admit.  Essential hypertension - Continue amlodipine. BP 123/63 today. Monitor blood pressures closely.  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 Continue to monitor urine output. She is around baseline.  Normocytic anemia - Hemoglobin is 10.2. No evidence for overt bleeding.  - Continue to monitor.  Dementia - Continue Memantine/Donepezil  Anxiety/Depression - Not very communicative. Flat affect. Continue Lexapro.  History of DVT - On Xarelto, this is discontinued. Patient has frequent falls white count personally 10 presentation to the ED for falls since January 2017. -She is very high risk to develop intracranial bleeding if she is discharged on anticoagulation. -  I also look and there is no recent Doppler to show recent DVT. Last Doppler was in 12/23/15 showed no evidence of DVT.  History of EtOH abuse -  Continue MVI, thiamine, folate.  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 Chronic right femoral neck fracture with superior displacement. Chronic sacral fracture at the S2-3 level with anterior angulation of the distal sacrum and coccyx. Mild superior endplate compression fractures at T12 and L2 are new from 2014 but may be chronic.  Discharge Instructions  Discharge Instructions    Diet - low sodium heart healthy    Complete by:  As directed    Increase activity slowly    Complete by:  As directed        Medication List    STOP taking these medications   ciprofloxacin 500 MG tablet Commonly known as:  CIPRO   loperamide 2 MG capsule Commonly known as:  IMODIUM   rivaroxaban 20 MG Tabs tablet Commonly known as:  XARELTO     TAKE these medications   acetaminophen 500 MG tablet Commonly known as:  TYLENOL Take 500 mg by mouth every 4 (four) hours as needed for mild pain.   amLODipine 5 MG tablet Commonly known as:  NORVASC Take 1 tablet (5 mg total) by mouth daily.   BAZA EX Apply 1 application topically daily.   divalproex 125 MG capsule Commonly known as:  DEPAKOTE SPRINKLE Take 250 mg by mouth 3 (three) times daily.   doxycycline 100 MG tablet Commonly known as:  VIBRA-TABS Take 1 tablet (100 mg total) by mouth 2 (two) times daily.   escitalopram 10 MG tablet Commonly known as:  LEXAPRO Take 10 mg by mouth daily.   ferrous sulfate 325 (65 FE) MG tablet Take 1 tablet (325 mg total) by mouth 3 (three) times daily after meals.   folic acid 1 MG tablet Commonly known as:  FOLVITE Take 1 tablet (1 mg total) by mouth daily.   furosemide 20 MG tablet Commonly known as:  LASIX Take 20 mg by mouth daily.   guaifenesin 100 MG/5ML syrup Commonly known as:  ROBITUSSIN Take 200 mg by mouth 3 (three) times daily as needed for cough.   lanolin ointment Apply 1 application  topically as needed for dry skin.   LORazepam 0.5 MG tablet Commonly known as:  ATIVAN Take 1 tablet (0.5 mg total) by mouth every 8 (eight) hours as needed for anxiety. What changed:  when to take this  reasons to take this   magnesium hydroxide 400 MG/5ML suspension Commonly known as:  MILK OF MAGNESIA Take 30 mLs by mouth daily as needed for mild constipation.   [place] 200-200-20 MG/5ML suspension Generic drug:  alum & mag hydroxide-simeth Take 30 mLs by mouth every 6 (six) hours as needed for indigestion or heartburn.   multivitamin with minerals Tabs tablet Take 1 tablet by mouth daily.   NAMENDA XR 14 MG Cp24 24 hr capsule Generic drug:  memantine Take 14 mg by mouth daily.   NAMZARIC 28-10 MG Cp24 Generic drug:  Memantine HCl-Donepezil HCl Take 1 capsule by mouth every morning.   neomycin-bacitracin-polymyxin ointment Commonly known as:  NEOSPORIN Apply 1 application topically as needed for wound care. apply to eye   QUEtiapine 25 MG tablet Commonly known as:  SEROQUEL Take 12.5 mg by mouth 2 (two) times daily.   thiamine 100 MG tablet Take 1 tablet (100 mg total) by  mouth daily.   traMADol 50 MG tablet Commonly known as:  ULTRAM Take 1 tablet (50 mg total) by mouth every 8 (eight) hours.       Allergies  Allergen Reactions  . Codeine Phosphate Nausea Only    Consultations:  None   Procedures (Echo, Carotid, EGD, Colonoscopy, ERCP)   Radiological studies: Ct Abdomen Pelvis Wo Contrast  Result Date: 08/03/2016 CLINICAL DATA:  Abdominal pain.  Altered mental status. EXAM: CT ABDOMEN AND PELVIS WITHOUT CONTRAST TECHNIQUE: Multidetector CT imaging of the abdomen and pelvis was performed following the standard protocol without IV contrast. COMPARISON:  Lumbar spine radiographs 12/19/2012. FINDINGS: Lower chest: Minimal atelectasis in the lung bases. Small calcified right hilar lymph nodes. Normal heart size. Hepatobiliary: The gallbladder is absent.  No biliary dilatation is seen. No focal liver abnormality is identified within limitations of noncontrast technique and beam hardening from the patient's arms. Pancreas: Unremarkable. Spleen: Punctate calcified granuloma. Adrenals/Urinary Tract: Unremarkable adrenal glands. Moderate right greater than left hydronephrosis and moderate to severe right and moderate left diffuse hydroureter. No urinary tract calculi identified. Moderate right renal cortical thinning. Mild-to-moderate irregular wall thickening diffusely involving the bladder, which is mildly distended. Stomach/Bowel: The stomach is within normal limits. A small duodenal diverticulum is noted. There is no evidence of bowel obstruction or wall thickening. The appendix is absent. A moderate amount of stool is present in the rectal vault. Vascular/Lymphatic: Abdominal aortic atherosclerosis without aneurysm. No enlarged lymph nodes are identified. Reproductive: Unremarkable uterus and right ovary. A homogeneous lead mildly hyperattenuating left adnexal mass measures 4.6 x 3.9 cm. Other: No intraperitoneal free fluid. No abdominal wall mass or hernia. Musculoskeletal: Old left rib fractures. Chronic right femoral neck fracture with superior displacement. Chronic sacral fracture at the S2-3 level with anterior angulation of the distal sacrum and coccyx. Mild superior endplate compression fractures at T12 and L2 are new from 2014 but may be chronic. IMPRESSION: 1. Diffuse bladder wall thickening which may reflect cystitis. 2. Moderate to severe right and moderate left hydroureteronephrosis. No obstructing calculi. This may be secondary to #1. 3. Mild T12 and L2 compression fractures, new from 2014 though of indeterminate acuity. 4. **An incidental finding of potential clinical significance has been found. 4.6 cm left adnexal mass. Nonurgent evaluation with pelvic ultrasound is recommended.** 5. Aortic atherosclerosis. Electronically Signed   By: Logan Bores  M.D.   On: 08/03/2016 18:52   Dg Chest 2 View  Result Date: 07/31/2016 CLINICAL DATA:  Altered mental status.  Seizure activity. EXAM: CHEST  2 VIEW COMPARISON:  12/23/2015 FINDINGS: Lateral view degraded by patient arm position. Artifact degraded lateral view posteriorly. Moderate right hemidiaphragm elevation. Chin overlies the apices. Patient rotated to the right on the frontal. Midline trachea. Mild cardiomegaly with a tortuous thoracic aorta. No pleural effusion or pneumothorax. Remote left rib trauma. Bibasilar scarring or atelectasis. No congestive failure. IMPRESSION: No acute cardiopulmonary disease. Cardiomegaly without congestive failure. Electronically Signed   By: Abigail Miyamoto M.D.   On: 07/31/2016 19:20   Ct Head Wo Contrast  Result Date: 07/31/2016 CLINICAL DATA:  Seizure activity. Altered mental status. History of dementia. EXAM: CT HEAD WITHOUT CONTRAST TECHNIQUE: Contiguous axial images were obtained from the base of the skull through the vertex without intravenous contrast. COMPARISON:  07/04/2016 FINDINGS: Brain: Cerebral atrophy for age. Moderate low density in the periventricular white matter likely related to small vessel disease. Cerebellar atrophy is also age advanced. No mass lesion, hemorrhage, hydrocephalus, acute infarct, intra-axial, or extra-axial  fluid collection. Vascular: Bilateral intracranial carotid and left vertebral artery atherosclerosis. Skull: Normal Sinuses/Orbits: Normal orbits and globes. Clear paranasal sinuses and mastoid air cells. Other: None. IMPRESSION: 1.  No acute intracranial abnormality. 2.  Cerebral atrophy and small vessel ischemic change. Electronically Signed   By: Abigail Miyamoto M.D.   On: 07/31/2016 19:23     Subjective:  Discharge Exam: Vitals:   08/04/16 1715 08/04/16 2241 08/05/16 0629 08/05/16 0941  BP: 128/75 117/72 115/64 122/64  Pulse: 63 (!) 57 (!) 53 (!) 53  Resp: 17 16 16 17   Temp: 98.3 F (36.8 C) 97.6 F (36.4 C) 98.2 F  (36.8 C) 98.2 F (36.8 C)  TempSrc: Oral Oral Oral Oral  SpO2: 98% 96% 98% 98%  Weight:      Height:       General: Pt is alert, awake, not in acute distress Cardiovascular: RRR, S1/S2 +, no rubs, no gallops Respiratory: CTA bilaterally, no wheezing, no rhonchi Abdominal: Soft, NT, ND, bowel sounds + Extremities: no edema, no cyanosis   The results of significant diagnostics from this hospitalization (including imaging, microbiology, ancillary and laboratory) are listed below for reference.    Microbiology: Recent Results (from the past 240 hour(s))  Urine culture     Status: Abnormal   Collection Time: 07/31/16  6:18 PM  Result Value Ref Range Status   Specimen Description URINE, CATHETERIZED  Final   Special Requests NONE  Final   Culture (A)  Final    60,000 COLONIES/mL METHICILLIN RESISTANT STAPHYLOCOCCUS AUREUS   Report Status 08/03/2016 FINAL  Final   Organism ID, Bacteria METHICILLIN RESISTANT STAPHYLOCOCCUS AUREUS (A)  Final      Susceptibility   Methicillin resistant staphylococcus aureus - MIC*    CIPROFLOXACIN >=8 RESISTANT Resistant     GENTAMICIN <=0.5 SENSITIVE Sensitive     NITROFURANTOIN <=16 SENSITIVE Sensitive     OXACILLIN >=4 RESISTANT Resistant     TETRACYCLINE <=1 SENSITIVE Sensitive     VANCOMYCIN 1 SENSITIVE Sensitive     TRIMETH/SULFA >=320 RESISTANT Resistant     CLINDAMYCIN <=0.25 SENSITIVE Sensitive     RIFAMPIN <=0.5 SENSITIVE Sensitive     Inducible Clindamycin NEGATIVE Sensitive     * 60,000 COLONIES/mL METHICILLIN RESISTANT STAPHYLOCOCCUS AUREUS  MRSA PCR Screening     Status: None   Collection Time: 08/01/16 12:13 AM  Result Value Ref Range Status   MRSA by PCR NEGATIVE NEGATIVE Final    Comment:        The GeneXpert MRSA Assay (FDA approved for NASAL specimens only), is one component of a comprehensive MRSA colonization surveillance program. It is not intended to diagnose MRSA infection nor to guide or monitor treatment for MRSA  infections.   Culture, blood (Routine X 2) w Reflex to ID Panel     Status: None (Preliminary result)   Collection Time: 08/03/16  8:54 AM  Result Value Ref Range Status   Specimen Description BLOOD LEFT ANTECUBITAL  Final   Special Requests IN PEDIATRIC BOTTLE 5CC  Final   Culture NO GROWTH < 24 HOURS  Final   Report Status PENDING  Incomplete  Culture, blood (Routine X 2) w Reflex to ID Panel     Status: None (Preliminary result)   Collection Time: 08/03/16  9:01 AM  Result Value Ref Range Status   Specimen Description BLOOD LEFT HAND  Final   Special Requests IN PEDIATRIC BOTTLE  5CC  Final   Culture NO GROWTH < 24 HOURS  Final  Report Status PENDING  Incomplete     Labs: BNP (last 3 results) No results for input(s): BNP in the last 8760 hours. Basic Metabolic Panel:  Recent Labs Lab 08/01/16 0546 08/02/16 0646 08/03/16 0519 08/04/16 0601 08/05/16 0501  NA 142 143 141 141 140  K 3.7 3.9 3.5 3.4* 4.5  CL 108 107 109 107 112*  CO2 29 30 27 27 24   GLUCOSE 75 71 89 87 86  BUN 17 15 15 10 8   CREATININE 1.31* 1.46* 1.42* 1.15* 1.07*  CALCIUM 8.1* 8.7* 8.7* 8.7* 8.5*  MG  --   --   --   --  2.4   Liver Function Tests:  Recent Labs Lab 07/31/16 1807  AST 20  ALT 12*  ALKPHOS 78  BILITOT 0.4  PROT 7.4  ALBUMIN 3.0*   No results for input(s): LIPASE, AMYLASE in the last 168 hours. No results for input(s): AMMONIA in the last 168 hours. CBC:  Recent Labs Lab 07/31/16 1807 07/31/16 1946 08/01/16 0546 08/02/16 0646 08/03/16 0519 08/04/16 0601  WBC 6.1  --  5.4 5.6 6.5 5.8  NEUTROABS 3.2  --   --   --   --   --   HGB 11.3* 10.9* 9.2* 9.9* 10.2* 10.2*  HCT 35.4* 32.0* 29.3* 32.2* 31.3* 31.1*  MCV 95.2  --  94.5 95.3 92.9 92.6  PLT 202  --  181 212 214 221   Cardiac Enzymes: No results for input(s): CKTOTAL, CKMB, CKMBINDEX, TROPONINI in the last 168 hours. BNP: Invalid input(s): POCBNP CBG: No results for input(s): GLUCAP in the last 168  hours. D-Dimer No results for input(s): DDIMER in the last 72 hours. Hgb A1c No results for input(s): HGBA1C in the last 72 hours. Lipid Profile No results for input(s): CHOL, HDL, LDLCALC, TRIG, CHOLHDL, LDLDIRECT in the last 72 hours. Thyroid function studies No results for input(s): TSH, T4TOTAL, T3FREE, THYROIDAB in the last 72 hours.  Invalid input(s): FREET3 Anemia work up No results for input(s): VITAMINB12, FOLATE, FERRITIN, TIBC, IRON, RETICCTPCT in the last 72 hours. Urinalysis    Component Value Date/Time   COLORURINE YELLOW 07/31/2016 1818   APPEARANCEUR TURBID (A) 07/31/2016 1818   LABSPEC 1.012 07/31/2016 1818   PHURINE 7.0 07/31/2016 1818   GLUCOSEU NEGATIVE 07/31/2016 1818   GLUCOSEU NEGATIVE 09/06/2012 0858   HGBUR MODERATE (A) 07/31/2016 1818   BILIRUBINUR NEGATIVE 07/31/2016 1818   KETONESUR NEGATIVE 07/31/2016 1818   PROTEINUR 30 (A) 07/31/2016 1818   UROBILINOGEN 1.0 03/13/2015 1747   NITRITE NEGATIVE 07/31/2016 1818   LEUKOCYTESUR LARGE (A) 07/31/2016 1818   Sepsis Labs Invalid input(s): PROCALCITONIN,  WBC,  LACTICIDVEN Microbiology Recent Results (from the past 240 hour(s))  Urine culture     Status: Abnormal   Collection Time: 07/31/16  6:18 PM  Result Value Ref Range Status   Specimen Description URINE, CATHETERIZED  Final   Special Requests NONE  Final   Culture (A)  Final    60,000 COLONIES/mL METHICILLIN RESISTANT STAPHYLOCOCCUS AUREUS   Report Status 08/03/2016 FINAL  Final   Organism ID, Bacteria METHICILLIN RESISTANT STAPHYLOCOCCUS AUREUS (A)  Final      Susceptibility   Methicillin resistant staphylococcus aureus - MIC*    CIPROFLOXACIN >=8 RESISTANT Resistant     GENTAMICIN <=0.5 SENSITIVE Sensitive     NITROFURANTOIN <=16 SENSITIVE Sensitive     OXACILLIN >=4 RESISTANT Resistant     TETRACYCLINE <=1 SENSITIVE Sensitive     VANCOMYCIN 1 SENSITIVE Sensitive  TRIMETH/SULFA >=320 RESISTANT Resistant     CLINDAMYCIN <=0.25 SENSITIVE  Sensitive     RIFAMPIN <=0.5 SENSITIVE Sensitive     Inducible Clindamycin NEGATIVE Sensitive     * 60,000 COLONIES/mL METHICILLIN RESISTANT STAPHYLOCOCCUS AUREUS  MRSA PCR Screening     Status: None   Collection Time: 08/01/16 12:13 AM  Result Value Ref Range Status   MRSA by PCR NEGATIVE NEGATIVE Final    Comment:        The GeneXpert MRSA Assay (FDA approved for NASAL specimens only), is one component of a comprehensive MRSA colonization surveillance program. It is not intended to diagnose MRSA infection nor to guide or monitor treatment for MRSA infections.   Culture, blood (Routine X 2) w Reflex to ID Panel     Status: None (Preliminary result)   Collection Time: 08/03/16  8:54 AM  Result Value Ref Range Status   Specimen Description BLOOD LEFT ANTECUBITAL  Final   Special Requests IN PEDIATRIC BOTTLE 5CC  Final   Culture NO GROWTH < 24 HOURS  Final   Report Status PENDING  Incomplete  Culture, blood (Routine X 2) w Reflex to ID Panel     Status: None (Preliminary result)   Collection Time: 08/03/16  9:01 AM  Result Value Ref Range Status   Specimen Description BLOOD LEFT HAND  Final   Special Requests IN PEDIATRIC BOTTLE  5CC  Final   Culture NO GROWTH < 24 HOURS  Final   Report Status PENDING  Incomplete     Time coordinating discharge: Over 30 minutes  SIGNED:   Birdie Hopes, MD  [facility] 08/05/2016, 10:57 AM Pager   If 7PM-7AM, please contact night-coverage [email or web address] Password [email or web address]

## 2016-08-05 NOTE — Progress Notes (Signed)
Physical Therapy Treatment Patient Details Name: Tara Shaffer MRN: [id number] DOB: January 18, 1946 Today's Date: 08/05/2016    History of Present Illness 70 y.o. female admitted to [facility] from SNF for mental status changes, involuntary shaking.  Pt dx with UTI with acute encepholopathy and multiple pressure wounds.  Pt with significant PMHx of low back pain, HTN, dicerticulosis, dementia, cervical spine degenreative disease, and anxiety.    PT Comments    pt's mobility will be limited unless pt gets a L hamstring release.  Follow Up Recommendations  SNF     Equipment Recommendations  None recommended by PT    Recommendations for Other Services       Precautions / Restrictions Precautions Precautions: Fall Restrictions Weight Bearing Restrictions: No    Mobility  Bed Mobility Overal bed mobility: Needs Assistance Bed Mobility: Supine to Sit     Supine to sit: Min assist     General bed mobility comments: cues for direction and minimal supportive assist.  pt assisted scooting to EOB  Transfers Overall transfer level: Needs assistance Equipment used: Rolling walker (2 wheeled) Transfers: Sit to/from [facility] Sit to Stand: Mod assist;+2 safety/equipment Stand pivot transfers: Mod assist;+2 safety/equipment       General transfer comment: pt stood well on R LE, but due to significant fL knee flexion contractures is unable to stand on the left LE.  Generally easy face to face transfer to recliner.  Ambulation/Gait             General Gait Details: not able due to flexion contracture   Stairs            Wheelchair Mobility    Modified Rankin (Stroke Patients Only)       Balance   Sitting-balance support: No upper extremity supported Sitting balance-Leahy Scale: Fair       Standing balance-Leahy Scale: Poor Standing balance comment: needs external assist and or AD                    Cognition Arousal/Alertness:  Awake/alert Behavior During Therapy: Flat affect Overall Cognitive Status: History of cognitive impairments - at baseline                      Exercises      General Comments General comments (skin integrity, edema, etc.): pt unable to relate anything about her facility environment, PLOF or history      Pertinent Vitals/Pain Pain Assessment: No/denies pain    Home Living                      Prior Function            PT Goals (current goals can now be found in the care plan section) Acute Rehab PT Goals Patient Stated Goal: none stated PT Goal Formulation: Patient unable to participate in goal setting Time For Goal Achievement: 08/16/16 Potential to Achieve Goals: Fair Progress towards PT goals: Progressing toward goals    Frequency    Min 2X/week      PT Plan Current plan remains appropriate    Co-evaluation             End of Session   Activity Tolerance: Patient limited by fatigue Patient left: in bed;with call bell/phone within reach     Time: 1139-1156 PT Time Calculation (min) (ACUTE ONLY): 17 min  Charges:  $Therapeutic Activity: 8-22 mins  G Codes:      Tara Shaffer, Tara Shaffer 08/05/2016, 1:12 PM 08/05/2016  Tara Shaffer, [place] [phone]  (pager)

## 2016-08-06 ENCOUNTER — Telehealth: Payer: Self-pay | Admitting: *Deleted

## 2016-08-06 NOTE — Telephone Encounter (Signed)
Pt was on TCM list admitted for UTI. Pt d/c 11/9, and sent to SNF "[facility]" per notes will need to see PCP 1-2 weeks after discharge from SNF...Tara Shaffer

## 2016-08-08 LAB — CULTURE, BLOOD (ROUTINE X 2)
CULTURE: NO GROWTH
Culture: NO GROWTH

## 2016-08-09 ENCOUNTER — Encounter: Payer: Self-pay | Admitting: Internal Medicine

## 2016-08-09 ENCOUNTER — Non-Acute Institutional Stay (SKILLED_NURSING_FACILITY): Payer: Medicare Other | Admitting: Internal Medicine

## 2016-08-09 DIAGNOSIS — F29 Unspecified psychosis not due to a substance or known physiological condition: Secondary | ICD-10-CM

## 2016-08-09 DIAGNOSIS — N183 Chronic kidney disease, stage 3 unspecified: Secondary | ICD-10-CM

## 2016-08-09 DIAGNOSIS — F1010 Alcohol abuse, uncomplicated: Secondary | ICD-10-CM

## 2016-08-09 DIAGNOSIS — I10 Essential (primary) hypertension: Secondary | ICD-10-CM | POA: Diagnosis not present

## 2016-08-09 DIAGNOSIS — R296 Repeated falls: Secondary | ICD-10-CM

## 2016-08-09 DIAGNOSIS — F0391 Unspecified dementia with behavioral disturbance: Secondary | ICD-10-CM

## 2016-08-09 DIAGNOSIS — F1027 Alcohol dependence with alcohol-induced persisting dementia: Secondary | ICD-10-CM

## 2016-08-09 DIAGNOSIS — I82519 Chronic embolism and thrombosis of unspecified femoral vein: Secondary | ICD-10-CM | POA: Diagnosis not present

## 2016-08-09 DIAGNOSIS — F1097 Alcohol use, unspecified with alcohol-induced persisting dementia: Secondary | ICD-10-CM | POA: Diagnosis not present

## 2016-08-09 DIAGNOSIS — N390 Urinary tract infection, site not specified: Secondary | ICD-10-CM | POA: Diagnosis not present

## 2016-08-09 DIAGNOSIS — D638 Anemia in other chronic diseases classified elsewhere: Secondary | ICD-10-CM | POA: Diagnosis not present

## 2016-08-09 DIAGNOSIS — F39 Unspecified mood [affective] disorder: Secondary | ICD-10-CM

## 2016-08-09 NOTE — Progress Notes (Signed)
Patient ID: KENZLEY KE, female   DOB: Feb 10, 1946, 70 y.o.   MRN: [id number]    HISTORY AND PHYSICAL  DATE: 08/09/2016  Location:    [facility] Room Number: [place] of Service: SNF (31)   Extended Emergency Contact Information Primary Emergency Contact: [facility] Address: [address]           [place], [place] [address] Johnnette Litter of [place] Phone: [phone] Work Phone: [phone] Mobile Phone: [phone] Relation: Son Secondary Emergency Contact: Sessler,Richard Address: [place]          [place] Johnnette Litter of [place] Phone: [phone] Relation: Son  Advanced Directive information Does patient have an advance directive?: No, Would patient like information on creating an advanced directive?: No - patient declined information  Chief Complaint  Patient presents with  . New Admit To SNF    HPI:  70 yo female seen today as a new admission into SNF following hospital stay for MRSA UTI,with b/l hydronephrosis, hypokalemia, recurrent falls, A/CKD, moderate malnutrition, chronic femoral DVT, dementia, loose stools, stage 2 buttock ulcer, HTN, anxiety/depression, hx Etoh abuse, chronic right femoral neck fx. Urine cx (+) MRSA with neg nasal swab. cefepime d/c'd on 11/7th --> vanco. Blood cx neg. She was d/c'd on doxy x 7 days. CT revealed severe right and moderate left hydroureteronephrosis, no stones but bladder wall thickening noted. CT head showed no acute process. xeralto stopped 2/2 frequent falls. K 3.2-->4.5; Cr 1.47-->1.07; Hgb 10.2; albumin 3.0. She presents to SNF for short term rehab with potential for long term care.  Today she has no concerns. No falls. Sleeps well. appetite ok but she requires assistance to feed. She is a poor historian due to dementia. Hx obtained from chart. She lived at [facility] prior to this SNF admission   DDD - pain controlled  GERD - stable on MOM  Hx melanoma - no  signs of recurrence  Hyperlipidemia - diet controlled  CKD - stage II. Cr 1.07  Hx Anemia - stable. 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 Current wt 110 lbs.  Albumin 3.0   Past Medical History:  Diagnosis Date  . Alcohol abuse 02/27/2012  . ALLERGIC RHINITIS 06/23/2007  . ANXIETY 06/23/2007  . ANXIETY DEPRESSION 06/12/2009  . Cancer ([place])    skin cx/ hx melanoma  . COLONIC POLYPS, HX OF 03/08/2002  . 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 Dementia   . DIVERTICULOSIS, COLON 08/04/2007  . GERD 06/23/2007  . HYPERLIPIDEMIA 06/24/2007  . HYPERTENSION 06/23/2007  . Irritable bowel syndrome 06/23/2007  . LOW BACK PAIN 06/24/2007  . MELANOMA, HX OF 08/04/2007  . Memory dysfunction 06/23/2011    Past Surgical History:  Procedure Laterality Date  . APPENDECTOMY    . CESAREAN SECTION     x 2  . COLONOSCOPY      Patient Care Team: Biagio Borg, MD as PCP - General  Social History   Social History  . Marital status: Married  Spouse name: [name]  . Number of children: [name]  . Years of education: [name]   Occupational History  . Not on file.   Social History Main Topics  . Smoking status: Never Smoker  . Smokeless tobacco: Never Used  . Alcohol use 25.2 oz/week    42 Glasses of wine per week  . Drug use: No  . Sexual activity: Yes    Birth control/ protection: Post-menopausal   Other Topics Concern  . Not on file   Social History Narrative  . No narrative on file     reports that she has never smoked. She has never used  smokeless tobacco. She reports that she drinks about 25.2 oz of alcohol per week . She reports that she does not use drugs.  Family History  Problem Relation Age of Onset  . Colon cancer Cousin   . Hypertension Other   . Diabetes Other    Family Status  Relation Status  . Mother Alive   COPD  . Father Deceased   died in accident  . Cousin Deceased at age 67  . Other     Immunization History  Administered Date(s) Administered  . Pneumococcal Conjugate-13 07/31/2014  . Td 09/28/1995, 08/20/2008  . Tdap 07/18/2014  . Zoster 01/16/2008    Allergies  Allergen Reactions  . Codeine Phosphate Nausea Only    Medications: Patient's Medications  New Prescriptions   No medications on file  Previous Medications   ACETAMINOPHEN (TYLENOL) 500 MG TABLET    Take 500 mg by mouth every 4 (four) hours as needed for mild pain.   ALUM & MAG HYDROXIDE-SIMETH (MINTOX) 202-542-70 MG/5ML SUSPENSION    Take 30 mLs by mouth every 6 (six) hours as needed for indigestion or heartburn.   AMLODIPINE (NORVASC) 5 MG TABLET    Take 1 tablet (5 mg total) by mouth daily.   DIVALPROEX (DEPAKOTE SPRINKLE) 125 MG CAPSULE    Take 250 mg by mouth 3 (three) times daily.    DOXYCYCLINE (VIBRA-TABS) 100 MG TABLET    Take 1 tablet (100 mg total) by mouth 2 (two) times daily.   ESCITALOPRAM (LEXAPRO) 10 MG TABLET    Take 10 mg by mouth daily.   FERROUS SULFATE 325 (65 FE) MG TABLET    Take 1 tablet (325 mg total) by mouth 3 (three) times daily after meals.   FOLIC ACID (FOLVITE) 1 MG TABLET    Take 1 tablet (1 mg total) by mouth daily.   GUAIFENESIN (ROBITUSSIN) 100 MG/5ML SYRUP    Take 200 mg by mouth 3 (three) times daily as needed for cough.   LANOLIN OINTMENT    Apply 1 application topically as needed for dry skin.   LORAZEPAM (ATIVAN) 0.5 MG TABLET    Take 1 tablet (0.5 mg total) by mouth every 8 (eight) hours as needed for anxiety.   MAGNESIUM HYDROXIDE (MILK OF MAGNESIA) 400 MG/5ML SUSPENSION    Take 30 mLs  by mouth daily as needed for mild constipation.   MEMANTINE (NAMENDA XR) 14 MG CP24 24 HR CAPSULE    Take 14 mg by mouth daily.   MEMANTINE HCL-DONEPEZIL HCL (NAMZARIC) 28-10 MG CP24    Take 1 capsule by mouth every morning.   MULTIPLE VITAMIN (MULTIVITAMIN WITH MINERALS) TABS TABLET    Take 1 tablet by mouth daily.   QUETIAPINE (SEROQUEL) 25 MG TABLET    Take 12.5 mg by mouth 2 (two) times daily.   THIAMINE 100 MG TABLET  Take 1 tablet (100 mg total) by mouth daily.   TORSEMIDE (DEMADEX) 10 MG TABLET    Take 10 mg by mouth daily.   TRAMADOL (ULTRAM) 50 MG TABLET    Take 1 tablet (50 mg total) by mouth every 8 (eight) hours.  Modified Medications   No medications on file  Discontinued Medications   CHLOROXYLENOL-ZINC OXIDE (BAZA EX)    Apply 1 application topically daily.   FUROSEMIDE (LASIX) 20 MG TABLET    Take 20 mg by mouth daily.   NEOMYCIN-BACITRACIN-POLYMYXIN (NEOSPORIN) OINTMENT    Apply 1 application topically as needed for wound care. apply to eye    Review of Systems  Unable to perform ROS: Dementia    Vitals:   08/09/16 0847  BP: 100/66  Pulse: 66  Resp: 18  Temp: 97.9 F (36.6 C)  TempSrc: Oral  SpO2: 98%  Weight: 110 lb 12.8 oz (50.3 kg)  Height: _0  (1.651 m)   Body mass index is 18.44 kg/m.  Physical Exam  Constitutional: She appears well-developed.  Frail appearing in NAD, sitting up in bed  HENT:  Mouth/Throat: Oropharynx is clear and moist. No oropharyngeal exudate.  No oral thrush  Eyes: Pupils are equal, round, and reactive to light. No scleral icterus.  Neck: Neck supple. Carotid bruit is not present. No tracheal deviation present. No thyromegaly present.  Cardiovascular: Normal rate, regular rhythm and intact distal pulses.  Exam reveals no gallop and no friction rub.   Murmur (1/6 SEM) heard. No LE edema b/l. no calf TTP.   Pulmonary/Chest: Effort normal and breath sounds normal. No stridor. No respiratory distress. She has no wheezes. She has  no rales.  Abdominal: Soft. Bowel sounds are normal. She exhibits no distension and no mass. There is no hepatomegaly. There is no tenderness. There is no rebound and no guarding.  Musculoskeletal: She exhibits edema.  Lymphadenopathy:    She has no cervical adenopathy.  Neurological: She is alert.  Skin: Skin is warm and dry. No rash noted.  Psychiatric: She has a normal mood and affect. Her behavior is normal.     Labs reviewed: Admission on 07/31/2016, Discharged on 08/05/2016  Component Date Value Ref Range Status  . WBC 07/31/2016 6.1  4.0 - 10.5 K/uL Final  . RBC 07/31/2016 3.72* 3.87 - 5.11 MIL/uL Final  . Hemoglobin 07/31/2016 11.3* 12.0 - 15.0 g/dL Final  . HCT 07/31/2016 35.4* 36.0 - 46.0 % Final  . MCV 07/31/2016 95.2  78.0 - 100.0 fL Final  . MCH 07/31/2016 30.4  26.0 - 34.0 pg Final  . MCHC 07/31/2016 31.9  30.0 - 36.0 g/dL Final  . RDW 07/31/2016 12.7  11.5 - 15.5 % Final  . Platelets 07/31/2016 202  150 - 400 K/uL Final  . Neutrophils Relative % 07/31/2016 52  % Final  . Neutro Abs 07/31/2016 3.2  1.7 - 7.7 K/uL Final  . Lymphocytes Relative 07/31/2016 33  % Final  . Lymphs Abs 07/31/2016 2.0  0.7 - 4.0 K/uL Final  . Monocytes Relative 07/31/2016 11  % Final  . Monocytes Absolute 07/31/2016 0.7  0.1 - 1.0 K/uL Final  . Eosinophils Relative 07/31/2016 3  % Final  . Eosinophils Absolute 07/31/2016 0.2  0.0 - 0.7 K/uL Final  . Basophils Relative 07/31/2016 1  % Final  . Basophils Absolute 07/31/2016 0.0  0.0 - 0.1 K/uL Final  . Sodium 07/31/2016 141  135 - 145 mmol/L Final  . Potassium 07/31/2016 3.2* 3.5 -  5.1 mmol/L Final  . Chloride 07/31/2016 99* 101 - 111 mmol/L Final  . CO2 07/31/2016 31  22 - 32 mmol/L Final  . Glucose, Bld 07/31/2016 86  65 - 99 mg/dL Final  . BUN 07/31/2016 23* 6 - 20 mg/dL Final  . Creatinine, Ser 07/31/2016 1.47* 0.44 - 1.00 mg/dL Final  . Calcium 07/31/2016 8.9  8.9 - 10.3 mg/dL Final  . Total Protein 07/31/2016 7.4  6.5 - 8.1 g/dL  Final  . Albumin 07/31/2016 3.0* 3.5 - 5.0 g/dL Final  . AST 07/31/2016 20  15 - 41 U/L Final  . ALT 07/31/2016 12* 14 - 54 U/L Final  . Alkaline Phosphatase 07/31/2016 78  38 - 126 U/L Final  . Total Bilirubin 07/31/2016 0.4  0.3 - 1.2 mg/dL Final  . GFR calc non Af Amer 07/31/2016 35* >60 mL/min Final  . GFR calc Af Amer 07/31/2016 41* >60 mL/min Final   Comment: (NOTE) The eGFR has been calculated using the CKD EPI equation. This calculation has not been validated in all clinical situations. eGFR's persistently <60 mL/min signify possible Chronic Kidney Disease.   . Anion gap 07/31/2016 11  5 - 15 Final  . Color, Urine 07/31/2016 YELLOW  YELLOW Final  . APPearance 07/31/2016 TURBID* CLEAR Final  . Specific Gravity, Urine 07/31/2016 1.012  1.005 - 1.030 Final  . pH 07/31/2016 7.0  5.0 - 8.0 Final  . Glucose, UA 07/31/2016 NEGATIVE  NEGATIVE mg/dL Final  . Hgb urine dipstick 07/31/2016 MODERATE* NEGATIVE Final  . Bilirubin Urine 07/31/2016 NEGATIVE  NEGATIVE Final  . Ketones, ur 07/31/2016 NEGATIVE  NEGATIVE mg/dL Final  . Protein, ur 07/31/2016 30* NEGATIVE mg/dL Final  . Nitrite 07/31/2016 NEGATIVE  NEGATIVE Final  . Leukocytes, UA 07/31/2016 LARGE* NEGATIVE Final  . Troponin i, poc 07/31/2016 0.01  0.00 - 0.08 ng/mL Final  . Comment 3 07/31/2016          Final   Comment: Due to the release kinetics of cTnI, a negative result within the first hours of the onset of symptoms does not rule out myocardial infarction with certainty. If myocardial infarction is still suspected, repeat the test at appropriate intervals.   . Lactic Acid, Venous 07/31/2016 0.64  0.5 - 1.9 mmol/L Final  . Squamous Epithelial / LPF 07/31/2016 0-5* NONE SEEN Final  . WBC, UA 07/31/2016 TOO NUMEROUS TO COUNT  0 - 5 WBC/hpf Final  . RBC / HPF 07/31/2016 0-5  0 - 5 RBC/hpf Final  . Bacteria, UA 07/31/2016 RARE* NONE SEEN Final  . Sodium 07/31/2016 143  135 - 145 mmol/L Final  . Potassium 07/31/2016 2.9*  3.5 - 5.1 mmol/L Final  . Chloride 07/31/2016 99* 101 - 111 mmol/L Final  . BUN 07/31/2016 23* 6 - 20 mg/dL Final  . Creatinine, Ser 07/31/2016 1.40* 0.44 - 1.00 mg/dL Final  . Glucose, Bld 07/31/2016 91  65 - 99 mg/dL Final  . Calcium, Ion 07/31/2016 1.10* 1.15 - 1.40 mmol/L Final  . TCO2 07/31/2016 33  0 - 100 mmol/L Final  . Hemoglobin 07/31/2016 10.9* 12.0 - 15.0 g/dL Final  . HCT 07/31/2016 32.0* 36.0 - 46.0 % Final  . Troponin i, poc 07/31/2016 0.00  0.00 - 0.08 ng/mL Final  . Comment 3 07/31/2016          Final   Comment: Due to the release kinetics of cTnI, a negative result within the first hours of the onset of symptoms does not rule out myocardial infarction  with certainty. If myocardial infarction is still suspected, repeat the test at appropriate intervals.   . Prothrombin Time 08/01/2016 13.9  11.4 - 15.2 seconds Final  . INR 08/01/2016 1.07   Final  . WBC 08/01/2016 5.4  4.0 - 10.5 K/uL Final  . RBC 08/01/2016 3.10* 3.87 - 5.11 MIL/uL Final  . Hemoglobin 08/01/2016 9.2* 12.0 - 15.0 g/dL Final  . HCT 08/01/2016 29.3* 36.0 - 46.0 % Final  . MCV 08/01/2016 94.5  78.0 - 100.0 fL Final  . MCH 08/01/2016 29.7  26.0 - 34.0 pg Final  . MCHC 08/01/2016 31.4  30.0 - 36.0 g/dL Final  . RDW 08/01/2016 12.7  11.5 - 15.5 % Final  . Platelets 08/01/2016 181  150 - 400 K/uL Final  . Sodium 08/01/2016 142  135 - 145 mmol/L Final  . Potassium 08/01/2016 3.7  3.5 - 5.1 mmol/L Final  . Chloride 08/01/2016 108  101 - 111 mmol/L Final  . CO2 08/01/2016 29  22 - 32 mmol/L Final  . Glucose, Bld 08/01/2016 75  65 - 99 mg/dL Final  . BUN 08/01/2016 17  6 - 20 mg/dL Final  . Creatinine, Ser 08/01/2016 1.31* 0.44 - 1.00 mg/dL Final  . Calcium 08/01/2016 8.1* 8.9 - 10.3 mg/dL Final  . GFR calc non Af Amer 08/01/2016 41* >60 mL/min Final  . GFR calc Af Amer 08/01/2016 47* >60 mL/min Final   Comment: (NOTE) The eGFR has been calculated using the CKD EPI equation. This calculation has not  been validated in all clinical situations. eGFR's persistently <60 mL/min signify possible Chronic Kidney Disease.   . Anion gap 08/01/2016 5  5 - 15 Final  . MRSA by PCR 08/01/2016 NEGATIVE  NEGATIVE Final   Comment:        The GeneXpert MRSA Assay (FDA approved for NASAL specimens only), is one component of a comprehensive MRSA colonization surveillance program. It is not intended to diagnose MRSA infection nor to guide or monitor treatment for MRSA infections.   Marland Kitchen Specimen Description 08/03/2016 URINE, CATHETERIZED   Final  . Special Requests 08/03/2016 NONE   Final  . Culture 08/03/2016 60,000 COLONIES/mL METHICILLIN RESISTANT STAPHYLOCOCCUS AUREUS*  Final  . Report Status 08/03/2016 08/03/2016 FINAL   Final  . Organism ID, Bacteria 08/03/2016 METHICILLIN RESISTANT STAPHYLOCOCCUS AUREUS*  Final  . WBC 08/02/2016 5.6  4.0 - 10.5 K/uL Final  . RBC 08/02/2016 3.38* 3.87 - 5.11 MIL/uL Final  . Hemoglobin 08/02/2016 9.9* 12.0 - 15.0 g/dL Final  . HCT 08/02/2016 32.2* 36.0 - 46.0 % Final  . MCV 08/02/2016 95.3  78.0 - 100.0 fL Final  . MCH 08/02/2016 29.3  26.0 - 34.0 pg Final  . MCHC 08/02/2016 30.7  30.0 - 36.0 g/dL Final  . RDW 08/02/2016 12.8  11.5 - 15.5 % Final  . Platelets 08/02/2016 212  150 - 400 K/uL Final  . Sodium 08/02/2016 143  135 - 145 mmol/L Final  . Potassium 08/02/2016 3.9  3.5 - 5.1 mmol/L Final  . Chloride 08/02/2016 107  101 - 111 mmol/L Final  . CO2 08/02/2016 30  22 - 32 mmol/L Final  . Glucose, Bld 08/02/2016 71  65 - 99 mg/dL Final  . BUN 08/02/2016 15  6 - 20 mg/dL Final  . Creatinine, Ser 08/02/2016 1.46* 0.44 - 1.00 mg/dL Final  . Calcium 08/02/2016 8.7* 8.9 - 10.3 mg/dL Final  . GFR calc non Af Amer 08/02/2016 36* >60 mL/min Final  . GFR calc Af  Amer 08/02/2016 41* >60 mL/min Final   Comment: (NOTE) The eGFR has been calculated using the CKD EPI equation. This calculation has not been validated in all clinical situations. eGFR's persistently <60  mL/min signify possible Chronic Kidney Disease.   . Anion gap 08/02/2016 6  5 - 15 Final  . WBC 08/03/2016 6.5  4.0 - 10.5 K/uL Final  . RBC 08/03/2016 3.37* 3.87 - 5.11 MIL/uL Final  . Hemoglobin 08/03/2016 10.2* 12.0 - 15.0 g/dL Final  . HCT 08/03/2016 31.3* 36.0 - 46.0 % Final  . MCV 08/03/2016 92.9  78.0 - 100.0 fL Final  . MCH 08/03/2016 30.3  26.0 - 34.0 pg Final  . MCHC 08/03/2016 32.6  30.0 - 36.0 g/dL Final  . RDW 08/03/2016 12.7  11.5 - 15.5 % Final  . Platelets 08/03/2016 214  150 - 400 K/uL Final  . Sodium 08/03/2016 141  135 - 145 mmol/L Final  . Potassium 08/03/2016 3.5  3.5 - 5.1 mmol/L Final  . Chloride 08/03/2016 109  101 - 111 mmol/L Final  . CO2 08/03/2016 27  22 - 32 mmol/L Final  . Glucose, Bld 08/03/2016 89  65 - 99 mg/dL Final  . BUN 08/03/2016 15  6 - 20 mg/dL Final  . Creatinine, Ser 08/03/2016 1.42* 0.44 - 1.00 mg/dL Final  . Calcium 08/03/2016 8.7* 8.9 - 10.3 mg/dL Final  . GFR calc non Af Amer 08/03/2016 37* >60 mL/min Final  . GFR calc Af Amer 08/03/2016 43* >60 mL/min Final   Comment: (NOTE) The eGFR has been calculated using the CKD EPI equation. This calculation has not been validated in all clinical situations. eGFR's persistently <60 mL/min signify possible Chronic Kidney Disease.   . Anion gap 08/03/2016 5  5 - 15 Final  . Specimen Description 08/08/2016 BLOOD LEFT HAND   Final  . Special Requests 08/08/2016 IN PEDIATRIC BOTTLE  5CC   Final  . Culture 08/08/2016 NO GROWTH 5 DAYS   Final  . Report Status 08/08/2016 08/08/2016 FINAL   Final  . Specimen Description 08/08/2016 BLOOD LEFT ANTECUBITAL   Final  . Special Requests 08/08/2016 IN PEDIATRIC BOTTLE 5CC   Final  . Culture 08/08/2016 NO GROWTH 5 DAYS   Final  . Report Status 08/08/2016 08/08/2016 FINAL   Final  . WBC 08/04/2016 5.8  4.0 - 10.5 K/uL Final  . RBC 08/04/2016 3.36* 3.87 - 5.11 MIL/uL Final  . Hemoglobin 08/04/2016 10.2* 12.0 - 15.0 g/dL Final  . HCT 08/04/2016 31.1* 36.0 -  46.0 % Final  . MCV 08/04/2016 92.6  78.0 - 100.0 fL Final  . MCH 08/04/2016 30.4  26.0 - 34.0 pg Final  . MCHC 08/04/2016 32.8  30.0 - 36.0 g/dL Final  . RDW 08/04/2016 12.9  11.5 - 15.5 % Final  . Platelets 08/04/2016 221  150 - 400 K/uL Final  . Sodium 08/04/2016 141  135 - 145 mmol/L Final  . Potassium 08/04/2016 3.4* 3.5 - 5.1 mmol/L Final  . Chloride 08/04/2016 107  101 - 111 mmol/L Final  . CO2 08/04/2016 27  22 - 32 mmol/L Final  . Glucose, Bld 08/04/2016 87  65 - 99 mg/dL Final  . BUN 08/04/2016 10  6 - 20 mg/dL Final  . Creatinine, Ser 08/04/2016 1.15* 0.44 - 1.00 mg/dL Final  . Calcium 08/04/2016 8.7* 8.9 - 10.3 mg/dL Final  . GFR calc non Af Amer 08/04/2016 47* >60 mL/min Final  . GFR calc Af Amer 08/04/2016 55* >60 mL/min Final  Comment: (NOTE) The eGFR has been calculated using the CKD EPI equation. This calculation has not been validated in all clinical situations. eGFR's persistently <60 mL/min signify possible Chronic Kidney Disease.   . Anion gap 08/04/2016 7  5 - 15 Final  . Magnesium 08/05/2016 2.4  1.7 - 2.4 mg/dL Final  . Sodium 08/05/2016 140  135 - 145 mmol/L Final  . Potassium 08/05/2016 4.5  3.5 - 5.1 mmol/L Final  . Chloride 08/05/2016 112* 101 - 111 mmol/L Final  . CO2 08/05/2016 24  22 - 32 mmol/L Final  . Glucose, Bld 08/05/2016 86  65 - 99 mg/dL Final  . BUN 08/05/2016 8  6 - 20 mg/dL Final  . Creatinine, Ser 08/05/2016 1.07* 0.44 - 1.00 mg/dL Final  . Calcium 08/05/2016 8.5* 8.9 - 10.3 mg/dL Final  . GFR calc non Af Amer 08/05/2016 52* >60 mL/min Final  . GFR calc Af Amer 08/05/2016 60* >60 mL/min Final   Comment: (NOTE) The eGFR has been calculated using the CKD EPI equation. This calculation has not been validated in all clinical situations. eGFR's persistently <60 mL/min signify possible Chronic Kidney Disease.   . Anion gap 08/05/2016 4* 5 - 15 Final    Ct Abdomen Pelvis Wo Contrast  Result Date: 08/03/2016 CLINICAL DATA:  Abdominal  pain.  Altered mental status. EXAM: CT ABDOMEN AND PELVIS WITHOUT CONTRAST TECHNIQUE: Multidetector CT imaging of the abdomen and pelvis was performed following the standard protocol without IV contrast. COMPARISON:  Lumbar spine radiographs 12/19/2012. FINDINGS: Lower chest: Minimal atelectasis in the lung bases. Small calcified right hilar lymph nodes. Normal heart size. Hepatobiliary: The gallbladder is absent. No biliary dilatation is seen. No focal liver abnormality is identified within limitations of noncontrast technique and beam hardening from the patient's arms. Pancreas: Unremarkable. Spleen: Punctate calcified granuloma. Adrenals/Urinary Tract: Unremarkable adrenal glands. Moderate right greater than left hydronephrosis and moderate to severe right and moderate left diffuse hydroureter. No urinary tract calculi identified. Moderate right renal cortical thinning. Mild-to-moderate irregular wall thickening diffusely involving the bladder, which is mildly distended. Stomach/Bowel: The stomach is within normal limits. A small duodenal diverticulum is noted. There is no evidence of bowel obstruction or wall thickening. The appendix is absent. A moderate amount of stool is present in the rectal vault. Vascular/Lymphatic: Abdominal aortic atherosclerosis without aneurysm. No enlarged lymph nodes are identified. Reproductive: Unremarkable uterus and right ovary. A homogeneous lead mildly hyperattenuating left adnexal mass measures 4.6 x 3.9 cm. Other: No intraperitoneal free fluid. No abdominal wall mass or hernia. Musculoskeletal: Old left rib fractures. Chronic right femoral neck fracture with superior displacement. Chronic sacral fracture at the S2-3 level with anterior angulation of the distal sacrum and coccyx. Mild superior endplate compression fractures at T12 and L2 are new from 2014 but may be chronic. IMPRESSION: 1. Diffuse bladder wall thickening which may reflect cystitis. 2. Moderate to severe right  and moderate left hydroureteronephrosis. No obstructing calculi. This may be secondary to #1. 3. Mild T12 and L2 compression fractures, new from 2014 though of indeterminate acuity. 4. **An incidental finding of potential clinical significance has been found. 4.6 cm left adnexal mass. Nonurgent evaluation with pelvic ultrasound is recommended.** 5. Aortic atherosclerosis. Electronically Signed   By: Logan Bores M.D.   On: 08/03/2016 18:52   Dg Chest 2 View  Result Date: 07/31/2016 CLINICAL DATA:  Altered mental status.  Seizure activity. EXAM: CHEST  2 VIEW COMPARISON:  12/23/2015 FINDINGS: Lateral view degraded by patient arm position.  Artifact degraded lateral view posteriorly. Moderate right hemidiaphragm elevation. Chin overlies the apices. Patient rotated to the right on the frontal. Midline trachea. Mild cardiomegaly with a tortuous thoracic aorta. No pleural effusion or pneumothorax. Remote left rib trauma. Bibasilar scarring or atelectasis. No congestive failure. IMPRESSION: No acute cardiopulmonary disease. Cardiomegaly without congestive failure. Electronically Signed   By: Abigail Miyamoto M.D.   On: 07/31/2016 19:20   Ct Head Wo Contrast  Result Date: 07/31/2016 CLINICAL DATA:  Seizure activity. Altered mental status. History of dementia. EXAM: CT HEAD WITHOUT CONTRAST TECHNIQUE: Contiguous axial images were obtained from the base of the skull through the vertex without intravenous contrast. COMPARISON:  07/04/2016 FINDINGS: Brain: Cerebral atrophy for age. Moderate low density in the periventricular white matter likely related to small vessel disease. Cerebellar atrophy is also age advanced. No mass lesion, hemorrhage, hydrocephalus, acute infarct, intra-axial, or extra-axial fluid collection. Vascular: Bilateral intracranial carotid and left vertebral artery atherosclerosis. Skull: Normal Sinuses/Orbits: Normal orbits and globes. Clear paranasal sinuses and mastoid air cells. Other: None.  IMPRESSION: 1.  No acute intracranial abnormality. 2.  Cerebral atrophy and small vessel ischemic change. Electronically Signed   By: Abigail Miyamoto M.D.   On: 07/31/2016 19:23     Assessment/Plan   ICD-9-CM ICD-10-CM   1. Urinary tract infection without hematuria, site unspecified 599.0 N39.0    MRSA  2. 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 Essential hypertension 401.9 I10   4. Psychosis, unspecified psychosis type 298.9 F29   5. Recurrent falls V15.88 R29.6   6. Anemia of chronic disease 285.29 D63.8   7. 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 CKD (chronic kidney disease) stage 3, GFR 30-59 ml/min 585.3 N18.3   9. Mood disorder ([place]) 296.90 F39   10. Alcohol abuse 305.00 F10.10     Check BMP and CBC  Refer to urology for b/l hydroureteronephrosis  D/c nemaneda xr but cont namzaric  Cont other meds as ordered. Finish abx  Contact precautions until abx completed for MRSA infection  PT/OT/ST as ordered  Fall precautions  GOAL: short term rehab then probable long term care. Communicated with pt and nursing.  Will follow  Ronee Ranganathan S. Perlie Gold  [facility] [address] [place], [place] [address] [phone] Cell (Monday-Friday 8 AM - 5 PM) [phone] After 5 PM and follow prompts

## 2016-08-10 LAB — CBC AND DIFFERENTIAL
HCT: 34 % — AB (ref 36–46)
Hemoglobin: 11 g/dL — AB (ref 12.0–16.0)
Platelets: 163 10*3/uL (ref 150–399)
WBC: 6.1 10*3/mL

## 2016-08-10 LAB — BASIC METABOLIC PANEL
BUN: 22 mg/dL — AB (ref 4–21)
CREATININE: 1.2 mg/dL — AB (ref 0.5–1.1)
GLUCOSE: 78 mg/dL
POTASSIUM: 3.5 mmol/L (ref 3.4–5.3)
SODIUM: 142 mmol/L (ref 137–147)

## 2016-08-12 LAB — CBC AND DIFFERENTIAL
HEMATOCRIT: 35 % — AB (ref 36–46)
Hemoglobin: 11.3 g/dL — AB (ref 12.0–16.0)
PLATELETS: 166 10*3/uL (ref 150–399)
WBC: 8.6 10^3/mL

## 2016-08-12 LAB — BASIC METABOLIC PANEL
BUN: 31 mg/dL — AB (ref 4–21)
CREATININE: 1.4 mg/dL — AB (ref 0.5–1.1)
GLUCOSE: 73 mg/dL
Potassium: 3.4 mmol/L (ref 3.4–5.3)
Sodium: 143 mmol/L (ref 137–147)

## 2016-08-21 ENCOUNTER — Encounter (HOSPITAL_COMMUNITY): Payer: Self-pay | Admitting: *Deleted

## 2016-08-21 ENCOUNTER — Inpatient Hospital Stay (HOSPITAL_COMMUNITY)
Admission: EM | Admit: 2016-08-21 | Discharge: 2016-08-26 | DRG: 682 | Disposition: A | Discharge status: Skilled Nursing Facility | Payer: Medicare Other | Attending: Internal Medicine | Admitting: Internal Medicine

## 2016-08-21 ENCOUNTER — Emergency Department (HOSPITAL_COMMUNITY): Payer: Medicare Other

## 2016-08-21 DIAGNOSIS — N3000 Acute cystitis without hematuria: Secondary | ICD-10-CM

## 2016-08-21 DIAGNOSIS — S72142A Displaced intertrochanteric fracture of left femur, initial encounter for closed fracture: Secondary | ICD-10-CM | POA: Diagnosis present

## 2016-08-21 DIAGNOSIS — D649 Anemia, unspecified: Secondary | ICD-10-CM | POA: Diagnosis present

## 2016-08-21 DIAGNOSIS — I10 Essential (primary) hypertension: Secondary | ICD-10-CM | POA: Diagnosis not present

## 2016-08-21 DIAGNOSIS — Z8249 Family history of ischemic heart disease and other diseases of the circulatory system: Secondary | ICD-10-CM

## 2016-08-21 DIAGNOSIS — L8943 Pressure ulcer of contiguous site of back, buttock and hip, stage 3: Secondary | ICD-10-CM

## 2016-08-21 DIAGNOSIS — F418 Other specified anxiety disorders: Secondary | ICD-10-CM | POA: Diagnosis present

## 2016-08-21 DIAGNOSIS — Z7401 Bed confinement status: Secondary | ICD-10-CM

## 2016-08-21 DIAGNOSIS — N183 Chronic kidney disease, stage 3 (moderate): Secondary | ICD-10-CM | POA: Diagnosis present

## 2016-08-21 DIAGNOSIS — S72009A Fracture of unspecified part of neck of unspecified femur, initial encounter for closed fracture: Secondary | ICD-10-CM

## 2016-08-21 DIAGNOSIS — Z85820 Personal history of malignant melanoma of skin: Secondary | ICD-10-CM

## 2016-08-21 DIAGNOSIS — E860 Dehydration: Secondary | ICD-10-CM | POA: Diagnosis present

## 2016-08-21 DIAGNOSIS — R7989 Other specified abnormal findings of blood chemistry: Secondary | ICD-10-CM

## 2016-08-21 DIAGNOSIS — T148XXA Other injury of unspecified body region, initial encounter: Secondary | ICD-10-CM

## 2016-08-21 DIAGNOSIS — L8990 Pressure ulcer of unspecified site, unspecified stage: Secondary | ICD-10-CM | POA: Diagnosis present

## 2016-08-21 DIAGNOSIS — E870 Hyperosmolality and hypernatremia: Secondary | ICD-10-CM | POA: Diagnosis present

## 2016-08-21 DIAGNOSIS — N390 Urinary tract infection, site not specified: Secondary | ICD-10-CM | POA: Diagnosis present

## 2016-08-21 DIAGNOSIS — E46 Unspecified protein-calorie malnutrition: Secondary | ICD-10-CM | POA: Diagnosis present

## 2016-08-21 DIAGNOSIS — W1830XA Fall on same level, unspecified, initial encounter: Secondary | ICD-10-CM | POA: Diagnosis present

## 2016-08-21 DIAGNOSIS — S72002A Fracture of unspecified part of neck of left femur, initial encounter for closed fracture: Secondary | ICD-10-CM

## 2016-08-21 DIAGNOSIS — F03918 Unspecified dementia, unspecified severity, with other behavioral disturbance: Secondary | ICD-10-CM | POA: Diagnosis present

## 2016-08-21 DIAGNOSIS — F329 Major depressive disorder, single episode, unspecified: Secondary | ICD-10-CM | POA: Diagnosis present

## 2016-08-21 DIAGNOSIS — L89153 Pressure ulcer of sacral region, stage 3: Secondary | ICD-10-CM | POA: Diagnosis present

## 2016-08-21 DIAGNOSIS — E876 Hypokalemia: Secondary | ICD-10-CM | POA: Diagnosis not present

## 2016-08-21 DIAGNOSIS — N179 Acute kidney failure, unspecified: Secondary | ICD-10-CM | POA: Diagnosis not present

## 2016-08-21 DIAGNOSIS — K5790 Diverticulosis of intestine, part unspecified, without perforation or abscess without bleeding: Secondary | ICD-10-CM | POA: Diagnosis present

## 2016-08-21 DIAGNOSIS — F1097 Alcohol use, unspecified with alcohol-induced persisting dementia: Secondary | ICD-10-CM

## 2016-08-21 DIAGNOSIS — E785 Hyperlipidemia, unspecified: Secondary | ICD-10-CM | POA: Diagnosis present

## 2016-08-21 DIAGNOSIS — I129 Hypertensive chronic kidney disease with stage 1 through stage 4 chronic kidney disease, or unspecified chronic kidney disease: Secondary | ICD-10-CM | POA: Diagnosis present

## 2016-08-21 DIAGNOSIS — N182 Chronic kidney disease, stage 2 (mild): Secondary | ICD-10-CM | POA: Diagnosis present

## 2016-08-21 DIAGNOSIS — F0391 Unspecified dementia with behavioral disturbance: Secondary | ICD-10-CM | POA: Diagnosis not present

## 2016-08-21 DIAGNOSIS — Z833 Family history of diabetes mellitus: Secondary | ICD-10-CM

## 2016-08-21 DIAGNOSIS — E43 Unspecified severe protein-calorie malnutrition: Secondary | ICD-10-CM | POA: Diagnosis present

## 2016-08-21 DIAGNOSIS — Z800 Family history of malignant neoplasm of digestive organs: Secondary | ICD-10-CM

## 2016-08-21 DIAGNOSIS — B3749 Other urogenital candidiasis: Secondary | ICD-10-CM | POA: Diagnosis present

## 2016-08-21 DIAGNOSIS — F419 Anxiety disorder, unspecified: Secondary | ICD-10-CM | POA: Diagnosis present

## 2016-08-21 DIAGNOSIS — R627 Adult failure to thrive: Secondary | ICD-10-CM | POA: Diagnosis present

## 2016-08-21 DIAGNOSIS — N1330 Unspecified hydronephrosis: Secondary | ICD-10-CM | POA: Diagnosis present

## 2016-08-21 DIAGNOSIS — M5030 Other cervical disc degeneration, unspecified cervical region: Secondary | ICD-10-CM | POA: Diagnosis present

## 2016-08-21 LAB — CBC WITH DIFFERENTIAL/PLATELET
BASOS ABS: 0 10*3/uL (ref 0.0–0.1)
Basophils Relative: 0 %
EOS PCT: 0 %
Eosinophils Absolute: 0 10*3/uL (ref 0.0–0.7)
HCT: 33.6 % — ABNORMAL LOW (ref 36.0–46.0)
Hemoglobin: 10.7 g/dL — ABNORMAL LOW (ref 12.0–15.0)
LYMPHS PCT: 10 %
Lymphs Abs: 1.7 10*3/uL (ref 0.7–4.0)
MCH: 30.9 pg (ref 26.0–34.0)
MCHC: 31.8 g/dL (ref 30.0–36.0)
MCV: 97.1 fL (ref 78.0–100.0)
MONO ABS: 2 10*3/uL — AB (ref 0.1–1.0)
Monocytes Relative: 12 %
Neutro Abs: 12.4 10*3/uL — ABNORMAL HIGH (ref 1.7–7.7)
Neutrophils Relative %: 78 %
PLATELETS: 144 10*3/uL — AB (ref 150–400)
RBC: 3.46 MIL/uL — ABNORMAL LOW (ref 3.87–5.11)
RDW: 14.7 % (ref 11.5–15.5)
WBC: 16 10*3/uL — ABNORMAL HIGH (ref 4.0–10.5)

## 2016-08-21 LAB — URINALYSIS, ROUTINE W REFLEX MICROSCOPIC
Bilirubin Urine: NEGATIVE
GLUCOSE, UA: NEGATIVE mg/dL
Ketones, ur: NEGATIVE mg/dL
Nitrite: NEGATIVE
PH: 5.5 (ref 5.0–8.0)
PROTEIN: NEGATIVE mg/dL
SPECIFIC GRAVITY, URINE: 1.018 (ref 1.005–1.030)

## 2016-08-21 LAB — URINE MICROSCOPIC-ADD ON

## 2016-08-21 LAB — BASIC METABOLIC PANEL
ANION GAP: 11 (ref 5–15)
BUN: 90 mg/dL — AB (ref 6–20)
CALCIUM: 8.8 mg/dL — AB (ref 8.9–10.3)
CO2: 30 mmol/L (ref 22–32)
Chloride: 106 mmol/L (ref 101–111)
Creatinine, Ser: 2.96 mg/dL — ABNORMAL HIGH (ref 0.44–1.00)
GFR calc Af Amer: 18 mL/min — ABNORMAL LOW (ref >60)
GFR, EST NON AFRICAN AMERICAN: 15 mL/min — AB (ref >60)
GLUCOSE: 125 mg/dL — AB (ref 65–99)
Potassium: 3.4 mmol/L — ABNORMAL LOW (ref 3.5–5.1)
Sodium: 147 mmol/L — ABNORMAL HIGH (ref 135–145)

## 2016-08-21 MED ORDER — SODIUM CHLORIDE 0.9 % IV BOLUS (SEPSIS)
1000.0000 mL | Freq: Once | INTRAVENOUS | Status: AC
  Administered 2016-08-21: 1000 mL via INTRAVENOUS

## 2016-08-21 MED ORDER — FLUCONAZOLE IN SODIUM CHLORIDE 200-0.9 MG/100ML-% IV SOLN
200.0000 mg | Freq: Once | INTRAVENOUS | Status: AC
  Administered 2016-08-22: 200 mg via INTRAVENOUS
  Filled 2016-08-21: qty 100

## 2016-08-21 MED ORDER — DEXTROSE 5 % IV SOLN
1.0000 g | Freq: Every day | INTRAVENOUS | Status: DC
  Administered 2016-08-21 – 2016-08-23 (×3): 1 g via INTRAVENOUS
  Filled 2016-08-21 (×3): qty 10

## 2016-08-21 MED ORDER — FLUCONAZOLE 150 MG PO TABS: 150.0000 mg | ORAL_TABLET | Freq: Once | ORAL | Status: DC

## 2016-08-21 MED ORDER — SODIUM CHLORIDE 0.9 % IV SOLN
INTRAVENOUS | Status: DC
  Administered 2016-08-22 – 2016-08-23 (×3): via INTRAVENOUS

## 2016-08-21 NOTE — Progress Notes (Signed)
Pharmacy Antibiotic Note  Tara Shaffer is a 70 y.o. female admitted on 08/21/2016 with UTI.  Pharmacy has been consulted for Ceftriaxone dosing.  Plan: Ceftriaxone 1gm iv q24hr       Temp (24hrs), Avg:97.8 F (36.6 C), Min:97.8 F (36.6 C), Max:97.8 F (36.6 C)   Recent Labs Lab 08/21/16 1959  WBC 16.0*  CREATININE 2.96*    Estimated Creatinine Clearance: 14.2 mL/min (by C-G formula based on SCr of 2.96 mg/dL (H)).    Allergies  Allergen Reactions  . Codeine Phosphate Nausea Only    Antimicrobials this admission: Ceftriaxone 11/25 >>  Dose adjustments this admission: -  Microbiology results: pending  Thank you for allowing pharmacy to be a part of this patient's care.  Nani Skillern Crowford 08/21/2016 11:08 PM

## 2016-08-21 NOTE — ED Provider Notes (Signed)
[facility] DEPT Provider Note   CSN: [id number] Arrival date & time: 08/21/16  1858     History   Chief Complaint Chief Complaint  Patient presents with  . Hip Pain    HPI Tara Shaffer is a 70 y.o. female.  Patient with dementia presents to the emergency department with reported left hip fracture. Patient reportedly fell one week ago, and had some reported pain in left hip. Plain films done at skilled nursing facility today show fracture.  Level V caveat applies secondary to dementia.   The history is provided by the patient. No language interpreter was used.    Past Medical History:  Diagnosis Date  . Alcohol abuse 02/27/2012  . ALLERGIC RHINITIS 06/23/2007  . ANXIETY 06/23/2007  . ANXIETY DEPRESSION 06/12/2009  . Cancer ([place])    skin cx/ hx melanoma  . COLONIC POLYPS, HX OF 03/08/2002  . 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 Dementia   . DIVERTICULOSIS, COLON 08/04/2007  . GERD 06/23/2007  . HYPERLIPIDEMIA 06/24/2007  . HYPERTENSION 06/23/2007  . Irritable bowel syndrome 06/23/2007  . LOW BACK PAIN 06/24/2007  . MELANOMA, HX OF 08/04/2007  . Memory dysfunction 06/23/2011    Patient Active Problem List   Diagnosis Date Noted  . UTI (urinary tract infection) 12/24/2015  . SIRS (systemic inflammatory response syndrome) ([place]) 12/24/2015  . AKI (acute kidney injury) ([place]) 12/23/2015  . Fracture of femoral neck, right ([place]) 12/23/2015  . Anemia of chronic disease 08/29/2015  . CKD (chronic kidney disease) stage 2, GFR 60-89 ml/min 08/29/2015  . Closed wound of head 08/25/2015  . Mood disorder ([place]) 06/11/2015  . Right leg swelling 03/21/2015  . DVT, femoral, chronic ([place]) 03/20/2015  . Pressure ulcer 03/14/2015  . Malnutrition of moderate degree ([place]) 03/14/2015  . Closed right hip fracture ([place]) 03/13/2015  . Recurrent falls 10/31/2014  . Impaired glucose tolerance 10/31/2014  . Scalp laceration 08/04/2014  . Posterior pain of right hip  12/19/2012  . 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 Hypokalemia 03/21/2012  . Alcohol abuse 02/27/2012  . Insomnia 09/17/2011  . Dementia with behavioral disturbance 06/23/2011  . Preventative health care 02/01/2011  . ANXIETY DEPRESSION 06/12/2009  . ELECTROCARDIOGRAM, ABNORMAL 08/20/2008  . 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 DIVERTICULOSIS, COLON 08/04/2007  . MELANOMA, HX OF 08/04/2007  . HLD (hyperlipidemia) 06/24/2007  . Morbid obesity ([place]) 06/23/2007  . Anxiety state 06/23/2007  . Essential hypertension 06/23/2007  . ALLERGIC RHINITIS 06/23/2007  . GERD 06/23/2007  . Irritable bowel syndrome 06/23/2007  . POSTMENOPAUSAL STATUS 06/23/2007  . COLONIC POLYPS, HX OF 03/08/2002    Past Surgical History:  Procedure Laterality Date  . APPENDECTOMY    . CESAREAN SECTION     x 2  . COLONOSCOPY      OB History    No data available       Home Medications    Prior to Admission medications   Medication Sig Start Date End Date Taking? Authorizing Provider  acetaminophen (TYLENOL) 500 MG tablet Take 500 mg by mouth every 4 (four) hours as needed for mild pain.    Historical Provider, MD  alum & mag hydroxide-simeth ([facility]) 200-200-20 MG/5ML suspension Take 30 mLs by mouth every 6 (six) hours as needed for indigestion or heartburn.    Historical Provider, MD  amLODipine (NORVASC) 5 MG tablet Take 1 tablet (5 mg total) by mouth daily. 03/17/15   [name], MD  divalproex (  DEPAKOTE SPRINKLE) 125 MG capsule Take 250 mg by mouth 3 (three) times daily.     Historical Provider, MD  doxycycline (VIBRA-TABS) 100 MG tablet Take 1 tablet (100 mg total) by mouth 2 (two) times daily. 08/05/16   [name], MD  escitalopram (LEXAPRO) 10 MG tablet Take 10 mg by mouth daily.    Historical Provider, MD  ferrous sulfate 325 (65 FE) MG tablet Take 1 tablet (325 mg total) by mouth 3 (three) times daily after meals. 03/17/15   [name], MD  folic acid (FOLVITE) 1 MG tablet Take 1 tablet (1 mg total) by mouth daily. 03/17/15   [name], MD  guaifenesin (ROBITUSSIN) 100 MG/5ML syrup Take 200 mg by mouth 3 (three) times daily as needed for cough.    Historical Provider, MD  lanolin ointment Apply 1 application topically as needed for dry skin.    Historical Provider, MD  LORazepam (ATIVAN) 0.5 MG tablet Take 1 tablet (0.5 mg total) by mouth every 8 (eight) hours as needed for anxiety. 08/05/16   [name], MD  magnesium hydroxide (MILK OF MAGNESIA) 400 MG/5ML suspension Take 30 mLs by mouth daily as needed for mild constipation.    Historical Provider, MD  memantine (NAMENDA XR) 14 MG CP24 24 hr capsule Take 14 mg by mouth daily.    Historical Provider, MD  Memantine HCl-Donepezil HCl (NAMZARIC) 28-10 MG CP24 Take 1 capsule by mouth every morning.    Historical Provider, MD  Multiple Vitamin (MULTIVITAMIN WITH MINERALS) TABS tablet Take 1 tablet by mouth daily. 03/17/15   [name], MD  QUEtiapine (SEROQUEL) 25 MG tablet Take 12.5 mg by mouth 2 (two) times daily.    Historical Provider, MD  thiamine 100 MG tablet Take 1 tablet (100 mg total) by mouth daily. 03/17/15   [name], MD  torsemide (DEMADEX) 10 MG tablet Take 10 mg by mouth daily.    Historical Provider, MD  traMADol (ULTRAM) 50 MG tablet Take 1 tablet (50 mg total) by mouth every 8 (eight) hours. 08/05/16   [name], MD    Family History Family History  Problem Relation Age of Onset  . Colon cancer Cousin   . Hypertension Other   . Diabetes Other     Social History Social History  Substance Use Topics  . Smoking status: Never Smoker  . Smokeless tobacco: Never Used  . Alcohol use 25.2 oz/week    42 Glasses of wine per week     Allergies   Codeine phosphate   Review of Systems Review of Systems  Unable to perform ROS: Dementia     Physical Exam Updated Vital Signs BP 118/66 (BP Location: Right Arm)   Pulse 102    Temp 97.8 F (36.6 C) (Oral)   Resp 18   SpO2 93%   Physical Exam  Constitutional: She appears well-developed and well-nourished.  frail  HENT:  Head: Normocephalic and atraumatic.  Eyes: Conjunctivae and EOM are normal. Pupils are equal, round, and reactive to light.  Neck: Normal range of motion. Neck supple.  Cardiovascular: Normal rate and regular rhythm.  Exam reveals no gallop and no friction rub.   No murmur heard. Pulmonary/Chest: Effort normal and breath sounds normal. No respiratory distress. She has no wheezes. She has no rales. She exhibits no tenderness.  Abdominal: Soft. Bowel sounds are normal. She exhibits no distension and no mass. There is no tenderness. There is no rebound and no guarding.  Musculoskeletal:  Normal range of motion. She exhibits no edema or tenderness.  Extremities held in contracture, but no obvious bony deformity or abnormality  Neurological: She is alert.  Skin: Skin is warm and dry.  Psychiatric:  demented  Nursing note and vitals reviewed.    ED Treatments / Results  Labs (all labs ordered are listed, but only abnormal results are displayed) Labs Reviewed - No data to display  EKG  EKG Interpretation None       Radiology No results found.  Procedures Procedures (including critical care time)  Medications Ordered in ED Medications - No data to display   Initial Impression / Assessment and Plan / ED Course  I have reviewed the triage vital signs and the nursing notes.  Pertinent labs & imaging results that were available during my care of the patient were reviewed by me and considered in my medical decision making (see chart for details).  Clinical Course     Patient brought to the emergency department over reported left hip fracture. This was found on plain film performed by the skilled nursing facility earlier today. Reportedly the patient fell while being transferred about a week ago.  She also has a chronic right  femoral neck fracture and chronic bedsores.    On plain films today a subacute left intertrochanteric femur fracture was found. I discussed this injury with Dr. Ninfa Linden of orthopedics, who recommended nonoperative therapy given that the patient is nonambulatory at baseline.  I discussed this plan with the patient's power of attorney [name] [phone]), who understands and agrees. He will come to see the patient in the morning.   Laboratory workup, the patient was found to have acute renal failure. We'll continue with fluids and admit to the hospital for observation overnight and hydration.  Patient discussed with Dr. Ralene Bathe, who agrees the plan.  Appreciate Dr. Eulas Post for admitting the patient. Final Clinical Impressions(s) / ED Diagnoses   Final diagnoses:  Acute renal failure, unspecified acute renal failure type [facility])    New Prescriptions New Prescriptions   No medications on file     Montine Circle, PA-C 08/21/16 [place]    Quintella Reichert, MD 08/22/16 1544

## 2016-08-21 NOTE — ED Notes (Signed)
Tara Shaffer called and stated in past orthopedists have been reluctant to take patient to surgery.  [phone].

## 2016-08-21 NOTE — ED Notes (Signed)
In and out cath performed at bedside with Tiana Loft, EMT and [facility], NT.

## 2016-08-21 NOTE — ED Notes (Signed)
Per SNF, patient does not have an advanced directive.

## 2016-08-21 NOTE — H&P (Signed)
History and Physical    Tara Shaffer [id number] DOB: 25-Jul-1946 DOA: 08/21/2016  PCP: Tara Cower, MD   Patient coming from: SNF  Chief Complaint: Left hip pain  HPI: Tara Shaffer is a 70 y.o. woman with a history of dementia, EtOH abuse, melanoma, anxiety/depression, HTN, and GERD who was referred from her SNF facility for evaluation of left hip pain after sustaining a fall during a transfer attempt one week ago.  She is nonambulatory at baseline.  She is alone in the ED and is not very communicative.  Her son was not available by phone.  ED Course: Xray shows new displaced intertrochanteric fracture of the left femur without dislocation. There is also evidence of old femoral neck fracture on the right as well as old bilateral pelvic fractures.  Orthopedic surgery consulted from the ED.  At this point, her injuries are considered nonoperable.  Conservative approach recommended, particularly since she already nonambulatory at baseline.  The patient has actually been referred for admission for AKI and probable UTI.  Creatinine almost 3, up from baseline around 1, and her BUN is elevated to 90.  Sodium 147.  Potassium 3.4.  WBC count 16.  Hgb 10.7 and appears stable/at baseline.  U/A shows large leukocytes, TNTC WBC, few bacteria, and yeast.    IV rocephin and one time dose of fluconazole ordered at time of admission.  She has received 1L of NS in the ED.  Review of Systems: Unable to obtain.   Past Medical History:  Diagnosis Date  . Alcohol abuse 02/27/2012  . ALLERGIC RHINITIS 06/23/2007  . ANXIETY 06/23/2007  . ANXIETY DEPRESSION 06/12/2009  . Cancer ([place])    skin cx/ hx melanoma  . COLONIC POLYPS, HX OF 03/08/2002  . 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 Dementia   . DIVERTICULOSIS, COLON 08/04/2007  . GERD 06/23/2007  . HYPERLIPIDEMIA 06/24/2007  . HYPERTENSION 06/23/2007  . Irritable bowel syndrome 06/23/2007  . LOW BACK PAIN 06/24/2007  . MELANOMA,  HX OF 08/04/2007  . Memory dysfunction 06/23/2011    Past Surgical History:  Procedure Laterality Date  . APPENDECTOMY    . CESAREAN SECTION     x 2  . COLONOSCOPY       reports that she has never smoked. She has never used smokeless tobacco. She reports that she drinks about 25.2 oz of alcohol per week . She reports that she does not use drugs.  Allergies  Allergen Reactions  . Codeine Phosphate Nausea Only    Family History  Problem Relation Age of Onset  . Colon cancer Cousin   . Hypertension Other   . Diabetes Other      Prior to Admission medications   Medication Sig Start Date End Date Taking? Authorizing Provider  acetaminophen (TYLENOL) 500 MG tablet Take 500 mg by mouth every 4 (four) hours as needed for mild pain.    Historical Provider, MD  alum & mag hydroxide-simeth ([place]) 200-200-20 MG/5ML suspension Take 30 mLs by mouth every 6 (six) hours as needed for indigestion or heartburn.    Historical Provider, MD  amLODipine (NORVASC) 5 MG tablet Take 1 tablet (5 mg total) by mouth daily. 03/17/15   [name], MD  divalproex (DEPAKOTE SPRINKLE) 125 MG capsule Take 250 mg by mouth 3 (three) times daily.     Historical Provider, MD  doxycycline (VIBRA-TABS) 100 MG tablet Take 1 tablet (100 mg total) by mouth 2 (two) times daily. 08/05/16   Tara  Elmahi, MD  escitalopram (LEXAPRO) 10 MG tablet Take 10 mg by mouth daily.    Historical Provider, MD  ferrous sulfate 325 (65 FE) MG tablet Take 1 tablet (325 mg total) by mouth 3 (three) times daily after meals. 03/17/15   [name], MD  folic acid (FOLVITE) 1 MG tablet Take 1 tablet (1 mg total) by mouth daily. 03/17/15   [name], MD  guaifenesin (ROBITUSSIN) 100 MG/5ML syrup Take 200 mg by mouth 3 (three) times daily as needed for cough.    Historical Provider, MD  lanolin ointment Apply 1 application topically as needed for dry skin.    Historical Provider, MD  LORazepam (ATIVAN) 0.5 MG tablet Take 1  tablet (0.5 mg total) by mouth every 8 (eight) hours as needed for anxiety. 08/05/16   Tara Monte, MD  magnesium hydroxide (MILK OF MAGNESIA) 400 MG/5ML suspension Take 30 mLs by mouth daily as needed for mild constipation.    Historical Provider, MD  memantine (NAMENDA XR) 14 MG CP24 24 hr capsule Take 14 mg by mouth daily.    Historical Provider, MD  Memantine HCl-Donepezil HCl (NAMZARIC) 28-10 MG CP24 Take 1 capsule by mouth every morning.    Historical Provider, MD  Multiple Vitamin (MULTIVITAMIN WITH MINERALS) TABS tablet Take 1 tablet by mouth daily. 03/17/15   [name], MD  QUEtiapine (SEROQUEL) 25 MG tablet Take 12.5 mg by mouth 2 (two) times daily.    Historical Provider, MD  thiamine 100 MG tablet Take 1 tablet (100 mg total) by mouth daily. 03/17/15   [name], MD  torsemide (DEMADEX) 10 MG tablet Take 10 mg by mouth daily.    Historical Provider, MD  traMADol (ULTRAM) 50 MG tablet Take 1 tablet (50 mg total) by mouth every 8 (eight) hours. 08/05/16   Tara Monte, MD    Physical Exam: Vitals:   08/21/16 1914 08/21/16 2049 08/21/16 2300  BP: 118/66 131/80 132/68  Pulse: 102 101 102  Resp: 18 18 16   Temp: 97.8 F (36.6 C)    TempSrc: Oral    SpO2: 93% 94% 95%      Constitutional: NAD, calm, comfortable, chronically ill appearing and appears older than her stated age but she is not decompensating.  Slightly unkempt.  Nails are dirty. Vitals:   08/21/16 1914 08/21/16 2049 08/21/16 2300  BP: 118/66 131/80 132/68  Pulse: 102 101 102  Resp: 18 18 16   Temp: 97.8 F (36.6 C)    TempSrc: Oral    SpO2: 93% 94% 95%   Eyes: PERRL, lids and conjunctivae normal ENMT: Mucous membranes are extremely dry, tongue and teeth appear dark green.  Posterior oropharynx not visualized. Neck: normal appearance, supple Respiratory: clear to auscultation bilaterally, no wheezing, no crackles. Normal respiratory effort. No accessory muscle use.  Cardiovascular: Normal rate,  regular rhythm, no murmurs / rubs / gallops. No extremity edema. 2+ pedal pulses. GI: abdomen is soft and compressible.  No distention.  No tenderness.  No masses palpated.  Bowel sounds are present. Musculoskeletal:  She has contractures in her legs with reduced range of motion.  Moves bilateral upper extremities spontaneously.    Skin: Pale, cool, dry.  Large area of skin breakdown over sacrum/buttock, present on admission.  Some areas of eschar apparent.  At least stage 3. Neurologic: No focal deficits. Psychiatric: Flat affect.  Slow thought process.  Disoriented.  Impaired judgment.    Labs on Admission: I have personally reviewed following labs and  imaging studies  CBC:  Recent Labs Lab 08/21/16 1959  WBC 16.0*  NEUTROABS 12.4*  HGB 10.7*  HCT 33.6*  MCV 97.1  PLT 123456*   Basic Metabolic Panel:  Recent Labs Lab 08/21/16 1959  NA 147*  K 3.4*  CL 106  CO2 30  GLUCOSE 125*  BUN 90*  CREATININE 2.96*  CALCIUM 8.8*   GFR: Estimated Creatinine Clearance: 14.2 mL/min (by C-G formula based on SCr of 2.96 mg/dL (H)).  Urine analysis:    Component Value Date/Time   COLORURINE YELLOW 08/21/2016 2146   APPEARANCEUR TURBID (A) 08/21/2016 2146   LABSPEC 1.018 08/21/2016 2146   PHURINE 5.5 08/21/2016 2146   GLUCOSEU NEGATIVE 08/21/2016 2146   GLUCOSEU NEGATIVE 09/06/2012 0858   HGBUR SMALL (A) 08/21/2016 2146   BILIRUBINUR NEGATIVE 08/21/2016 2146   KETONESUR NEGATIVE 08/21/2016 2146   PROTEINUR NEGATIVE 08/21/2016 2146   UROBILINOGEN 1.0 03/13/2015 1747   NITRITE NEGATIVE 08/21/2016 2146   LEUKOCYTESUR LARGE (A) 08/21/2016 2146   Radiological Exams on Admission: Dg Hip Unilat W Or W/o Pelvis Min 4 Views Left  Result Date: 08/21/2016 CLINICAL DATA:  LEFT hip pain, fall last weekend. EXAM: DG HIP (WITH OR WITHOUT PELVIS) 4+V LEFT COMPARISON:  CT abdomen and pelvis August 03, 2016 FINDINGS: Acute comminuted LEFT hip intertrochanteric fracture nonunited with  impaction, mild varus angulation of the distal bony fragments. Osteopenia. Old bilateral superior and inferior pubic rami fractures. Old RIGHT femoral head fracture with overriding bony fragments. IMPRESSION: Acute displaced LEFT femur intertrochanteric fracture without dislocation. Old nonunited RIGHT femoral neck fracture. Old bilateral pelvic fractures. Electronically Signed   By: Elon Alas M.D.   On: 08/21/2016 19:58    Assessment/Plan Principal Problem:   ARF (acute renal failure) (HCC) Active Problems:   Essential hypertension   Dementia with behavioral disturbance   Pressure ulcer   CKD (chronic kidney disease) stage 2, GFR 60-89 ml/min   AKI (acute kidney injury) ([place])   UTI (urinary tract infection)   Hip fracture (HCC)      AKI with probable UTI.  Clinically appears dehydrated and I question her nutritional status based on her appearance. --Hydrate with NS --HOLD torsemide --Treat with Rocephin --Urine culture pending --One time dose of fluconazole given --Repeat BMP in the AM --Dietician consult  Left hip fracture --No surgery anticipated.  Input from Dr. Rush Farmer appreciated. --Analgesics as needed; on tramadol at baseline --Nonambulatory at baseline  Stage 3 sacral decubitus ulcer, POA --Wound care consult --Nutrition consult  History of dementia --continue Nemenda, Aricept, seroquel, depakote  Anxiety/depression --Continue lexapro --Can use benzo prn if needed  HTN --Amlodipine   Ultimate goals of care as well as code status will need to be addressed before she is transferred back to SNF.    DVT prophylaxis: Lovenox Code Status: FULL (Presumed)  No advance directive on file at SNF. Family Communication: Attempted to reach her son/POA by phone; no answer.  Left voicemail. Disposition Plan: Back to SNF when ready. Consults called: Orthopedic surgery Dr. Rush Farmer Admission status: Place in observation, med surg   TIME SPENT: 78  minutes   Eber Jones MD [facility] Pager [phone]  If 7PM-7AM, please contact night-coverage [email or web address] Password [facility]  08/21/2016, 11:58 PM

## 2016-08-21 NOTE — ED Triage Notes (Signed)
Per EMS - patient comes from [place] with c/o left hip pain.  Patient had a fall last weekend and c/o left hip pain.  Plain films done today at SNF indicate left femur fx.  Patient is not ambulatory.  Patient has good pulses in both LE.  Patient was sent here for treatment.  Vitals HR 93, BP 101/74.

## 2016-08-22 ENCOUNTER — Observation Stay (HOSPITAL_COMMUNITY): Payer: Medicare Other

## 2016-08-22 DIAGNOSIS — E860 Dehydration: Secondary | ICD-10-CM | POA: Diagnosis present

## 2016-08-22 DIAGNOSIS — S72001D Fracture of unspecified part of neck of right femur, subsequent encounter for closed fracture with routine healing: Secondary | ICD-10-CM | POA: Diagnosis not present

## 2016-08-22 DIAGNOSIS — M5030 Other cervical disc degeneration, unspecified cervical region: Secondary | ICD-10-CM | POA: Diagnosis present

## 2016-08-22 DIAGNOSIS — K5790 Diverticulosis of intestine, part unspecified, without perforation or abscess without bleeding: Secondary | ICD-10-CM | POA: Diagnosis present

## 2016-08-22 DIAGNOSIS — B3749 Other urogenital candidiasis: Secondary | ICD-10-CM | POA: Diagnosis present

## 2016-08-22 DIAGNOSIS — W1830XA Fall on same level, unspecified, initial encounter: Secondary | ICD-10-CM | POA: Diagnosis present

## 2016-08-22 DIAGNOSIS — E43 Unspecified severe protein-calorie malnutrition: Secondary | ICD-10-CM | POA: Diagnosis present

## 2016-08-22 DIAGNOSIS — Z85820 Personal history of malignant melanoma of skin: Secondary | ICD-10-CM | POA: Diagnosis not present

## 2016-08-22 DIAGNOSIS — Z800 Family history of malignant neoplasm of digestive organs: Secondary | ICD-10-CM | POA: Diagnosis not present

## 2016-08-22 DIAGNOSIS — N1330 Unspecified hydronephrosis: Secondary | ICD-10-CM

## 2016-08-22 DIAGNOSIS — E870 Hyperosmolality and hypernatremia: Secondary | ICD-10-CM

## 2016-08-22 DIAGNOSIS — F419 Anxiety disorder, unspecified: Secondary | ICD-10-CM | POA: Diagnosis present

## 2016-08-22 DIAGNOSIS — I10 Essential (primary) hypertension: Secondary | ICD-10-CM | POA: Diagnosis present

## 2016-08-22 DIAGNOSIS — S72001A Fracture of unspecified part of neck of right femur, initial encounter for closed fracture: Secondary | ICD-10-CM | POA: Diagnosis not present

## 2016-08-22 DIAGNOSIS — S72142A Displaced intertrochanteric fracture of left femur, initial encounter for closed fracture: Secondary | ICD-10-CM | POA: Diagnosis present

## 2016-08-22 DIAGNOSIS — N182 Chronic kidney disease, stage 2 (mild): Secondary | ICD-10-CM | POA: Diagnosis not present

## 2016-08-22 DIAGNOSIS — N183 Chronic kidney disease, stage 3 (moderate): Secondary | ICD-10-CM | POA: Diagnosis present

## 2016-08-22 DIAGNOSIS — R627 Adult failure to thrive: Secondary | ICD-10-CM | POA: Diagnosis present

## 2016-08-22 DIAGNOSIS — F329 Major depressive disorder, single episode, unspecified: Secondary | ICD-10-CM | POA: Diagnosis present

## 2016-08-22 DIAGNOSIS — Z8249 Family history of ischemic heart disease and other diseases of the circulatory system: Secondary | ICD-10-CM | POA: Diagnosis not present

## 2016-08-22 DIAGNOSIS — N179 Acute kidney failure, unspecified: Secondary | ICD-10-CM | POA: Diagnosis not present

## 2016-08-22 DIAGNOSIS — I129 Hypertensive chronic kidney disease with stage 1 through stage 4 chronic kidney disease, or unspecified chronic kidney disease: Secondary | ICD-10-CM | POA: Diagnosis present

## 2016-08-22 DIAGNOSIS — E785 Hyperlipidemia, unspecified: Secondary | ICD-10-CM | POA: Diagnosis present

## 2016-08-22 DIAGNOSIS — F0391 Unspecified dementia with behavioral disturbance: Secondary | ICD-10-CM | POA: Diagnosis present

## 2016-08-22 DIAGNOSIS — E46 Unspecified protein-calorie malnutrition: Secondary | ICD-10-CM | POA: Diagnosis present

## 2016-08-22 DIAGNOSIS — L89153 Pressure ulcer of sacral region, stage 3: Secondary | ICD-10-CM | POA: Diagnosis present

## 2016-08-22 DIAGNOSIS — F418 Other specified anxiety disorders: Secondary | ICD-10-CM | POA: Diagnosis present

## 2016-08-22 DIAGNOSIS — D649 Anemia, unspecified: Secondary | ICD-10-CM | POA: Diagnosis present

## 2016-08-22 LAB — BASIC METABOLIC PANEL
Anion gap: 10 (ref 5–15)
BUN: 83 mg/dL — ABNORMAL HIGH (ref 6–20)
CHLORIDE: 110 mmol/L (ref 101–111)
CO2: 27 mmol/L (ref 22–32)
CREATININE: 2.43 mg/dL — AB (ref 0.44–1.00)
Calcium: 8.3 mg/dL — ABNORMAL LOW (ref 8.9–10.3)
GFR calc non Af Amer: 19 mL/min — ABNORMAL LOW (ref >60)
GFR, EST AFRICAN AMERICAN: 22 mL/min — AB (ref >60)
Glucose, Bld: 101 mg/dL — ABNORMAL HIGH (ref 65–99)
POTASSIUM: 3.6 mmol/L (ref 3.5–5.1)
SODIUM: 147 mmol/L — AB (ref 135–145)

## 2016-08-22 LAB — CBC
HCT: 32.7 % — ABNORMAL LOW (ref 36.0–46.0)
HEMOGLOBIN: 10.4 g/dL — AB (ref 12.0–15.0)
MCH: 30.8 pg (ref 26.0–34.0)
MCHC: 31.8 g/dL (ref 30.0–36.0)
MCV: 96.7 fL (ref 78.0–100.0)
Platelets: 151 10*3/uL (ref 150–400)
RBC: 3.38 MIL/uL — AB (ref 3.87–5.11)
RDW: 14.4 % (ref 11.5–15.5)
WBC: 14.5 10*3/uL — AB (ref 4.0–10.5)

## 2016-08-22 LAB — MRSA PCR SCREENING: MRSA BY PCR: NEGATIVE

## 2016-08-22 MED ORDER — AMLODIPINE BESYLATE 5 MG PO TABS
5.0000 mg | ORAL_TABLET | Freq: Every day | ORAL | Status: DC
  Administered 2016-08-22 – 2016-08-26 (×5): 5 mg via ORAL
  Filled 2016-08-22 (×5): qty 1

## 2016-08-22 MED ORDER — MEMANTINE HCL ER 28 MG PO CP24
28.0000 mg | ORAL_CAPSULE | Freq: Every day | ORAL | Status: DC
  Administered 2016-08-22 – 2016-08-26 (×5): 28 mg via ORAL
  Filled 2016-08-22 (×5): qty 1

## 2016-08-22 MED ORDER — ACETAMINOPHEN 325 MG PO TABS
650.0000 mg | ORAL_TABLET | Freq: Four times a day (QID) | ORAL | Status: DC | PRN
  Administered 2016-08-22: 650 mg via ORAL
  Filled 2016-08-22: qty 2

## 2016-08-22 MED ORDER — ENOXAPARIN SODIUM 30 MG/0.3ML ~~LOC~~ SOLN
30.0000 mg | SUBCUTANEOUS | Status: DC
  Administered 2016-08-22 – 2016-08-23 (×2): 30 mg via SUBCUTANEOUS
  Filled 2016-08-22 (×2): qty 0.3

## 2016-08-22 MED ORDER — QUETIAPINE FUMARATE 25 MG PO TABS
12.5000 mg | ORAL_TABLET | Freq: Two times a day (BID) | ORAL | Status: DC
  Administered 2016-08-22 – 2016-08-25 (×7): 12.5 mg via ORAL
  Administered 2016-08-25: 11:00:00 via ORAL
  Administered 2016-08-26: 12.5 mg via ORAL
  Filled 2016-08-22 (×9): qty 1

## 2016-08-22 MED ORDER — ONDANSETRON HCL 4 MG/2ML IJ SOLN: 4.0000 mg | Freq: Four times a day (QID) | INTRAMUSCULAR | Status: DC | PRN

## 2016-08-22 MED ORDER — ESCITALOPRAM OXALATE 10 MG PO TABS
10.0000 mg | ORAL_TABLET | Freq: Every day | ORAL | Status: DC
  Administered 2016-08-22 – 2016-08-25 (×4): 10 mg via ORAL
  Filled 2016-08-22 (×4): qty 1

## 2016-08-22 MED ORDER — FLUCONAZOLE 100 MG PO TABS
100.0000 mg | ORAL_TABLET | Freq: Every day | ORAL | Status: AC
  Administered 2016-08-23 – 2016-08-26 (×4): 100 mg via ORAL
  Filled 2016-08-22 (×4): qty 1

## 2016-08-22 MED ORDER — TRAMADOL HCL 50 MG PO TABS
50.0000 mg | ORAL_TABLET | Freq: Three times a day (TID) | ORAL | Status: DC
  Administered 2016-08-22 – 2016-08-26 (×13): 50 mg via ORAL
  Filled 2016-08-22 (×13): qty 1

## 2016-08-22 MED ORDER — MEMANTINE HCL-DONEPEZIL HCL ER 28-10 MG PO CP24: 1.0000 | ORAL_CAPSULE | ORAL | Status: DC

## 2016-08-22 MED ORDER — ACETAMINOPHEN 650 MG RE SUPP: 650.0000 mg | Freq: Four times a day (QID) | RECTAL | Status: DC | PRN

## 2016-08-22 MED ORDER — DIVALPROEX SODIUM 125 MG PO CSDR
250.0000 mg | DELAYED_RELEASE_CAPSULE | Freq: Three times a day (TID) | ORAL | Status: DC
  Administered 2016-08-22 – 2016-08-26 (×14): 250 mg via ORAL
  Filled 2016-08-22 (×14): qty 2

## 2016-08-22 MED ORDER — DONEPEZIL HCL 10 MG PO TABS
10.0000 mg | ORAL_TABLET | Freq: Every day | ORAL | Status: DC
  Administered 2016-08-22 – 2016-08-25 (×4): 10 mg via ORAL
  Filled 2016-08-22 (×4): qty 1

## 2016-08-22 MED ORDER — ONDANSETRON HCL 4 MG PO TABS: 4.0000 mg | ORAL_TABLET | Freq: Four times a day (QID) | ORAL | Status: DC | PRN

## 2016-08-22 NOTE — Progress Notes (Signed)
Pt admitted to floor @ 0100. No family present. Patient with dementia. Admission history deferred @ this time.

## 2016-08-22 NOTE — Progress Notes (Signed)
PHARMACY NOTE -  Rocephin  Pharmacy has been assisting with dosing of Rocephin for UTI. Dosage remains stable at 1gm IV q24h and need for further dosage adjustment appears unlikely at present.    Will sign off at this time.  Please reconsult if a change in clinical status warrants re-evaluation of dosage.   Netta Cedars, PharmD, BCPS Pager: [phone] 08/22/2016@2 :15 PM

## 2016-08-22 NOTE — Consult Note (Signed)
Urology Consult   Physician requesting consult: Dillard Cannon  Reason for consult:  Bilateral hydronephrosis  History of Present Illness: Tara Shaffer is a 70 y.o. female With a history of dementia, hypertension, GERD, admitted for left hip pain.  She was found to have a new left hip fracture as well as evidence of an old right femoral fracture and pelvic fractures.  The patient is not considered a surgical candidate due to the fact that she is nonambulatory/bedbound.  She was admitted earlier this month for similar issues.  She was found to have hydronephrosis, but with normal renal function, urologic consultation was not requested at that time. At that time, admission creatinine was 1.5, and on discharge several days Later was 1.1.  On admission yesterday, creatinine here was 2.96.  Ultrasound revealed bilateral hydroureteronephrosis and a thickened bladder wall.  CT scan from prior admission revealed similar findings. There is a left adnexal mass, just under 5 centimeters in size.  The patient just had a Foley catheter placed.  Prior to this admission, I do not know the method of bladder drainage.    Past Medical History:  Diagnosis Date  . Alcohol abuse 02/27/2012  . ALLERGIC RHINITIS 06/23/2007  . ANXIETY 06/23/2007  . ANXIETY DEPRESSION 06/12/2009  . Cancer ([place])    skin cx/ hx melanoma  . COLONIC POLYPS, HX OF 03/08/2002  . 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 Dementia   . DIVERTICULOSIS, COLON 08/04/2007  . GERD 06/23/2007  . HYPERLIPIDEMIA 06/24/2007  . HYPERTENSION 06/23/2007  . Irritable bowel syndrome 06/23/2007  . LOW BACK PAIN 06/24/2007  . MELANOMA, HX OF 08/04/2007  . Memory dysfunction 06/23/2011    Past Surgical History:  Procedure Laterality Date  . APPENDECTOMY    . CESAREAN SECTION     x 2  . COLONOSCOPY       Current Hospital Medications: Scheduled Meds: . amLODipine  5 mg Oral Daily  . cefTRIAXone (ROCEPHIN)  IV  1 g Intravenous QHS  .  divalproex  250 mg Oral TID  . donepezil  10 mg Oral QHS  . enoxaparin (LOVENOX) injection  30 mg Subcutaneous Q24H  . escitalopram  10 mg Oral Daily  . memantine  28 mg Oral Daily  . QUEtiapine  12.5 mg Oral BID  . traMADol  50 mg Oral Q8H   Continuous Infusions: . sodium chloride 100 mL/hr at 08/22/16 1508   PRN Meds:.acetaminophen **OR** acetaminophen, ondansetron **OR** ondansetron (ZOFRAN) IV  Allergies:  Allergies  Allergen Reactions  . Codeine Phosphate Nausea Only    Family History  Problem Relation Age of Onset  . Colon cancer Cousin   . Hypertension Other   . Diabetes Other     Social History:  reports that she has never smoked. She has never used smokeless tobacco. She reports that she drinks about 25.2 oz of alcohol per week . She reports that she does not use drugs.  ROS: Not obtainable due to level V, VI  Physical Exam:  Vital signs in last 24 hours: Temp:  [97.7 F (36.5 C)-98.3 F (36.8 C)] 98.3 F (36.8 C) (11/26 1522) Pulse Rate:  [82-102] 82 (11/26 1522) Resp:  [16-18] 18 (11/26 1522) BP: (105-132)/(51-80) 113/51 (11/26 1522) SpO2:  [93 %-97 %] 97 % (11/26 1522) General:  Patient is not alert to surroundings or time. No acute distress HEENT: Normocephalic, atraumatic.  Dentition poor Neck: No JVD or lymphadenopathy Lungs: Normal inspiratory and expiratory excursion Abdomen: Soft, nontender, nondistended,  no abdominal masses Extremities: No edema Neurologic: Patient  Awake but not alert.  Laboratory Data:   Recent Labs  08/21/16 1959 08/22/16 0618  WBC 16.0* 14.5*  HGB 10.7* 10.4*  HCT 33.6* 32.7*  PLT 144* 151     Recent Labs  08/21/16 1959 08/22/16 0618  NA 147* 147*  K 3.4* 3.6  CL 106 110  GLUCOSE 125* 101*  BUN 90* 83*  CALCIUM 8.8* 8.3*  CREATININE 2.96* 2.43*     Results for orders placed or performed during the hospital encounter of 08/21/16 (from the past 24 hour(s))  CBC with Differential/Platelet     Status:  Abnormal   Collection Time: 08/21/16  7:59 PM  Result Value Ref Range   WBC 16.0 (H) 4.0 - 10.5 K/uL   RBC 3.46 (L) 3.87 - 5.11 MIL/uL   Hemoglobin 10.7 (L) 12.0 - 15.0 g/dL   HCT 33.6 (L) 36.0 - 46.0 %   MCV 97.1 78.0 - 100.0 fL   MCH 30.9 26.0 - 34.0 pg   MCHC 31.8 30.0 - 36.0 g/dL   RDW 14.7 11.5 - 15.5 %   Platelets 144 (L) 150 - 400 K/uL   Neutrophils Relative % 78 %   Neutro Abs 12.4 (H) 1.7 - 7.7 K/uL   Lymphocytes Relative 10 %   Lymphs Abs 1.7 0.7 - 4.0 K/uL   Monocytes Relative 12 %   Monocytes Absolute 2.0 (H) 0.1 - 1.0 K/uL   Eosinophils Relative 0 %   Eosinophils Absolute 0.0 0.0 - 0.7 K/uL   Basophils Relative 0 %   Basophils Absolute 0.0 0.0 - 0.1 K/uL  Basic metabolic panel     Status: Abnormal   Collection Time: 08/21/16  7:59 PM  Result Value Ref Range   Sodium 147 (H) 135 - 145 mmol/L   Potassium 3.4 (L) 3.5 - 5.1 mmol/L   Chloride 106 101 - 111 mmol/L   CO2 30 22 - 32 mmol/L   Glucose, Bld 125 (H) 65 - 99 mg/dL   BUN 90 (H) 6 - 20 mg/dL   Creatinine, Ser 2.96 (H) 0.44 - 1.00 mg/dL   Calcium 8.8 (L) 8.9 - 10.3 mg/dL   GFR calc non Af Amer 15 (L) >60 mL/min   GFR calc Af Amer 18 (L) >60 mL/min   Anion gap 11 5 - 15  Urinalysis, Routine w reflex microscopic (not at [facility])     Status: Abnormal   Collection Time: 08/21/16  9:46 PM  Result Value Ref Range   Color, Urine YELLOW YELLOW   APPearance TURBID (A) CLEAR   Specific Gravity, Urine 1.018 1.005 - 1.030   pH 5.5 5.0 - 8.0   Glucose, UA NEGATIVE NEGATIVE mg/dL   Hgb urine dipstick SMALL (A) NEGATIVE   Bilirubin Urine NEGATIVE NEGATIVE   Ketones, ur NEGATIVE NEGATIVE mg/dL   Protein, ur NEGATIVE NEGATIVE mg/dL   Nitrite NEGATIVE NEGATIVE   Leukocytes, UA LARGE (A) NEGATIVE  Urine microscopic-add on     Status: Abnormal   Collection Time: 08/21/16  9:46 PM  Result Value Ref Range   Squamous Epithelial / LPF 0-5 (A) NONE SEEN   WBC, UA TOO NUMEROUS TO COUNT 0 - 5 WBC/hpf   RBC / HPF 0-5 0 - 5  RBC/hpf   Bacteria, UA FEW (A) NONE SEEN   Urine-Other YEAST PRESENT   CBC     Status: Abnormal   Collection Time: 08/22/16  6:18 AM  Result Value Ref Range  WBC 14.5 (H) 4.0 - 10.5 K/uL   RBC 3.38 (L) 3.87 - 5.11 MIL/uL   Hemoglobin 10.4 (L) 12.0 - 15.0 g/dL   HCT 32.7 (L) 36.0 - 46.0 %   MCV 96.7 78.0 - 100.0 fL   MCH 30.8 26.0 - 34.0 pg   MCHC 31.8 30.0 - 36.0 g/dL   RDW 14.4 11.5 - 15.5 %   Platelets 151 150 - 400 K/uL  Basic metabolic panel     Status: Abnormal   Collection Time: 08/22/16  6:18 AM  Result Value Ref Range   Sodium 147 (H) 135 - 145 mmol/L   Potassium 3.6 3.5 - 5.1 mmol/L   Chloride 110 101 - 111 mmol/L   CO2 27 22 - 32 mmol/L   Glucose, Bld 101 (H) 65 - 99 mg/dL   BUN 83 (H) 6 - 20 mg/dL   Creatinine, Ser 2.43 (H) 0.44 - 1.00 mg/dL   Calcium 8.3 (L) 8.9 - 10.3 mg/dL   GFR calc non Af Amer 19 (L) >60 mL/min   GFR calc Af Amer 22 (L) >60 mL/min   Anion gap 10 5 - 15  MRSA PCR Screening     Status: None   Collection Time: 08/22/16  9:40 AM  Result Value Ref Range   MRSA by PCR NEGATIVE NEGATIVE   Recent Results (from the past 240 hour(s))  MRSA PCR Screening     Status: None   Collection Time: 08/22/16  9:40 AM  Result Value Ref Range Status   MRSA by PCR NEGATIVE NEGATIVE Final    Comment:        The GeneXpert MRSA Assay (FDA approved for NASAL specimens only), is one component of a comprehensive MRSA colonization surveillance program. It is not intended to diagnose MRSA infection nor to guide or monitor treatment for MRSA infections.     Renal Function:  Recent Labs  08/21/16 1959 08/22/16 0618  CREATININE 2.96* 2.43*   Estimated Creatinine Clearance: 17.4 mL/min (by C-G formula based on SCr of 2.43 mg/dL (H)).  Radiologic Imaging: US Renal  Result Date: 08/22/2016 CLINICAL DATA:  Patient with elevated creatinine. EXAM: RENAL / URINARY TRACT ULTRASOUND COMPLETE COMPARISON:  CT abdomen pelvis 08/03/2016. FINDINGS: Right Kidney:  Length: 13.4 cm. Mild renal cortical thinning increased renal cortical echogenicity. Moderate right hydronephrosis. Left Kidney: Length: 11.4 cm. Mild renal cortical thinning. Increased renal cortical echogenicity. Moderate hydronephrosis. Bladder: Circumferential wall thickening of the urinary bladder. IMPRESSION: Limited exam. Moderate bilateral hydronephrosis. Nonspecific circumferential wall thickening of the urinary bladder. Electronically Signed   By: Lovey Newcomer M.D.   On: 08/22/2016 12:28   Dg Hip Unilat W Or W/o Pelvis Min 4 Views Left  Result Date: 08/21/2016 CLINICAL DATA:  LEFT hip pain, fall last weekend. EXAM: DG HIP (WITH OR WITHOUT PELVIS) 4+V LEFT COMPARISON:  CT abdomen and pelvis August 03, 2016 FINDINGS: Acute comminuted LEFT hip intertrochanteric fracture nonunited with impaction, mild varus angulation of the distal bony fragments. Osteopenia. Old bilateral superior and inferior pubic rami fractures. Old RIGHT femoral head fracture with overriding bony fragments. IMPRESSION: Acute displaced LEFT femur intertrochanteric fracture without dislocation. Old nonunited RIGHT femoral neck fracture. Old bilateral pelvic fractures. Electronically Signed   By: Elon Alas M.D.   On: 08/21/2016 19:58    I independently reviewed the above imaging studies.  Impression/Assessment:  Bilateral hydroureteronephrosis down to the bladder, with a thick-walled bladder.  The patient probably has this hydronephrosis due to a high pressure bladder.  She  currently has a Foley catheter in.  The patient is a poor candidate for any significant maneuvers, I.e. Cystoscopy, stent placement or nephrostomy tubes, as I don't think there is a significant amount to gain from any of these procedures.  Plan:    I would suggest a Foley catheter be left in, not just short-term, but perhaps long-term as is is quite possible the patient has a high pressure bladder and inadequate Upper tract drainage because of  this.  With Foley catheter in place, I would recommend repeating daily creatinines and a renal ultrasound in a couple of days.  If ultrasound shows resolving hydronephrosis, it will be obvious that long-term Foley catheter placement is necessary.  If the hydronephrosis and renal insufficiency does not resolve, discussion with the family should be held regarding more aggressive measures to relieve her  Hydronephrosis with aggressive operative or interventional radiology measures.  I would argue against this, however.

## 2016-08-22 NOTE — Progress Notes (Signed)
PROGRESS NOTE  Tara Shaffer [id number] DOB: [birth year] DOA: 08/21/2016 PCP: Tara Cower, MD  HPI/Recap of past 24 hours:  Demented female , laying in bed, not able to provide reliable history  Assessment/Plan: Principal Problem:   ARF (acute renal failure) ([place]) Active Problems:   Essential hypertension   Dementia with behavioral disturbance   Pressure ulcer   CKD (chronic kidney disease) stage 2, GFR 60-89 ml/min   AKI (acute kidney injury) ([place])   UTI (urinary tract infection)   Hip fracture ([place])   AKI on CKD III  Cr a few weeks ago was 1.07, cr 2.96 this hospitalization ua + yeast, wbc TNTC, few bacteria, , she has leukocytosis, no fever, urine culture pending Renal US + moderate bilateral hydronephrosis She also look dehydrated and malnurished Foley placed in, continue rocephin and diflucan, continue ivf,  urology consulted, recommended chronic indwelling foley and repeat US in the next few weeks, detail please refer to urology consult note  Hypernatremia: likely from dehydration on ivf  Left hip fracture --No surgery anticipated.  Input from Dr. Rush Shaffer appreciated. --Analgesics as needed; on tramadol at baseline --Nonambulatory at baseline   Stage 3 sacral decubitus ulcer, POA --Wound care consult --Nutrition consult   HTN --Amlodipine   Anxiety/depression --Continue lexapro --Can use benzo prn if needed  Dementia/FTT  -SNF resident,  --continue Nemenda, Aricept, seroquel, depakote    DVT prophylaxis: Lovenox Code Status: FULL (Presumed)  No advance directive on file at SNF. Family Communication: Attempted to reach her son/POA by phone; no answer.  Left voicemail. Disposition Plan: Back to SNF once cr improving and culture result finalized Consults called:  Orthopedic surgery Dr. Rush Shaffer Urology Dr Tara Shaffer   Procedures:  none  Antibiotics:  rocephin   Objective: BP 115/61 (BP Location: Right Arm)   Pulse 93    Temp 97.7 F (36.5 C) (Oral)   Resp 17   SpO2 97%  No intake or output data in the 24 hours ending 08/22/16 0924 There were no vitals filed for this visit.  Exam:   General:  NAD, frail, demented  Cardiovascular: RRR  Respiratory: CTABL  Abdomen: Soft/ND/NT, positive BS  Musculoskeletal: contracture in legs  Neuro: dementia, oriented to self only  Data Reviewed: Basic Metabolic Panel:  Recent Labs Lab 08/21/16 1959 08/22/16 0618  NA 147* 147*  K 3.4* 3.6  CL 106 110  CO2 30 27  GLUCOSE 125* 101*  BUN 90* 83*  CREATININE 2.96* 2.43*  CALCIUM 8.8* 8.3*   Liver Function Tests: No results for input(s): AST, ALT, ALKPHOS, BILITOT, PROT, ALBUMIN in the last 168 hours. No results for input(s): LIPASE, AMYLASE in the last 168 hours. No results for input(s): AMMONIA in the last 168 hours. CBC:  Recent Labs Lab 08/21/16 1959 08/22/16 0618  WBC 16.0* 14.5*  NEUTROABS 12.4*  --   HGB 10.7* 10.4*  HCT 33.6* 32.7*  MCV 97.1 96.7  PLT 144* 151   Cardiac Enzymes:   No results for input(s): CKTOTAL, CKMB, CKMBINDEX, TROPONINI in the last 168 hours. BNP (last 3 results) No results for input(s): BNP in the last 8760 hours.  ProBNP (last 3 results) No results for input(s): PROBNP in the last 8760 hours.  CBG: No results for input(s): GLUCAP in the last 168 hours.  No results found for this or any previous visit (from the past 240 hour(s)).   Studies: Dg Hip Unilat W Or W/o Pelvis Min 4 Views Left  Result Date: 08/21/2016 CLINICAL DATA:  LEFT hip pain, fall last weekend. EXAM: DG HIP (WITH OR WITHOUT PELVIS) 4+V LEFT COMPARISON:  CT abdomen and pelvis August 03, 2016 FINDINGS: Acute comminuted LEFT hip intertrochanteric fracture nonunited with impaction, mild varus angulation of the distal bony fragments. Osteopenia. Old bilateral superior and inferior pubic rami fractures. Old RIGHT femoral head fracture with overriding bony fragments. IMPRESSION: Acute displaced  LEFT femur intertrochanteric fracture without dislocation. Old nonunited RIGHT femoral neck fracture. Old bilateral pelvic fractures. Electronically Signed   By: Elon Alas M.D.   On: 08/21/2016 19:58    Scheduled Meds: . amLODipine  5 mg Oral Daily  . cefTRIAXone (ROCEPHIN)  IV  1 g Intravenous QHS  . divalproex  250 mg Oral TID  . donepezil  10 mg Oral QHS  . enoxaparin (LOVENOX) injection  30 mg Subcutaneous Q24H  . escitalopram  10 mg Oral Daily  . memantine  28 mg Oral Daily  . QUEtiapine  12.5 mg Oral BID  . traMADol  50 mg Oral Q8H    Continuous Infusions: . sodium chloride 100 mL/hr at 08/22/16 0022     Time spent: 55mins  Tara Vavrek MD, PhD  [facility] Pager [phone]. If 7PM-7AM, please contact night-coverage at [email or web address], password [facility] 08/22/2016, 9:24 AM  LOS: 0 days

## 2016-08-23 ENCOUNTER — Inpatient Hospital Stay (HOSPITAL_COMMUNITY): Payer: Medicare Other

## 2016-08-23 DIAGNOSIS — E43 Unspecified severe protein-calorie malnutrition: Secondary | ICD-10-CM | POA: Insufficient documentation

## 2016-08-23 LAB — COMPREHENSIVE METABOLIC PANEL
ALT: 23 U/L (ref 14–54)
AST: 33 U/L (ref 15–41)
Albumin: 1.9 g/dL — ABNORMAL LOW (ref 3.5–5.0)
Alkaline Phosphatase: 51 U/L (ref 38–126)
Anion gap: 6 (ref 5–15)
BILIRUBIN TOTAL: 0.8 mg/dL (ref 0.3–1.2)
BUN: 64 mg/dL — AB (ref 6–20)
CHLORIDE: 118 mmol/L — AB (ref 101–111)
CO2: 26 mmol/L (ref 22–32)
CREATININE: 1.77 mg/dL — AB (ref 0.44–1.00)
Calcium: 7.9 mg/dL — ABNORMAL LOW (ref 8.9–10.3)
GFR calc Af Amer: 33 mL/min — ABNORMAL LOW (ref >60)
GFR, EST NON AFRICAN AMERICAN: 28 mL/min — AB (ref >60)
Glucose, Bld: 82 mg/dL (ref 65–99)
Potassium: 3.2 mmol/L — ABNORMAL LOW (ref 3.5–5.1)
Sodium: 150 mmol/L — ABNORMAL HIGH (ref 135–145)
TOTAL PROTEIN: 5.2 g/dL — AB (ref 6.5–8.1)

## 2016-08-23 LAB — CBC
HEMATOCRIT: 25 % — AB (ref 36.0–46.0)
HEMOGLOBIN: 7.9 g/dL — AB (ref 12.0–15.0)
MCH: 30.7 pg (ref 26.0–34.0)
MCHC: 31.6 g/dL (ref 30.0–36.0)
MCV: 97.3 fL (ref 78.0–100.0)
Platelets: 115 10*3/uL — ABNORMAL LOW (ref 150–400)
RBC: 2.57 MIL/uL — AB (ref 3.87–5.11)
RDW: 14.5 % (ref 11.5–15.5)
WBC: 7.5 10*3/uL (ref 4.0–10.5)

## 2016-08-23 LAB — URINE CULTURE

## 2016-08-23 MED ORDER — ENSURE ENLIVE PO LIQD
237.0000 mL | Freq: Three times a day (TID) | ORAL | Status: DC
  Administered 2016-08-23 – 2016-08-26 (×6): 237 mL via ORAL

## 2016-08-23 MED ORDER — DEXTROSE 5 % IV SOLN
INTRAVENOUS | Status: DC
  Administered 2016-08-23 – 2016-08-26 (×3): via INTRAVENOUS

## 2016-08-23 MED ORDER — POTASSIUM CHLORIDE CRYS ER 20 MEQ PO TBCR
40.0000 meq | EXTENDED_RELEASE_TABLET | Freq: Two times a day (BID) | ORAL | Status: AC
  Administered 2016-08-23 (×2): 40 meq via ORAL
  Filled 2016-08-23 (×2): qty 2

## 2016-08-23 NOTE — Progress Notes (Signed)
PROGRESS NOTE  TARESHA BALUYOT [id number] DOB: Sep 02, 1946 DOA: 08/21/2016 PCP: [name], MD  HPI/Recap of past 24 hours:  Non communicative.   Assessment/Plan: Principal Problem:   ARF (acute renal failure) ([facility]) Active Problems:   Essential hypertension   Dementia with behavioral disturbance   Pressure ulcer   CKD (chronic kidney disease) stage 2, GFR 60-89 ml/min   AKI (acute kidney injury) ([place])   UTI (urinary tract infection)   Hip fracture ([facility])   Protein-calorie malnutrition, severe   AKI on CKD III  Cr a few weeks ago was 1.07, cr 2.96 this hospitalization ua + yeast, wbc TNTC, few bacteria, , she has leukocytosis, no fever, urine culture show yeast Renal US + moderate bilateral hydronephrosis She also look dehydrated and malnurished Foley placed in, d/c rocephin, continue with diflucan and IVF.  urology consulted, recommended chronic indwelling foley and repeat US in the next few weeks, detail please refer to urology consult note Repeat creatinine is much improved. Its value is 1.7.  Hypokalemia: replete as needed and repeat in am.   Anemia: drop of hemoglobin from 10 to 7.9. Anemia panel ordered. Repeat hemoglobin am. Stool for occult blood ordered.   Hypernatremia: likely from dehydration on ivf, change to dextrose fluids.   Left hip fracture --No surgery anticipated.  Input from Dr. Rush Farmer appreciated. --Analgesics as needed; on tramadol at baseline --Nonambulatory at baseline   Stage 3 sacral decubitus ulcer, POA --Wound care consult --Nutrition consult   HTN Well controlled.  --Amlodipine   Anxiety/depression --Continue lexapro --Can use benzo prn if needed  Dementia/FTT  -SNF resident,  --continue Nemenda, Aricept, seroquel, depakote    DVT prophylaxis: Lovenox Code Status: FULL (Presumed)  No advance directive on file at SNF. Family Communication: Attempted to reach her son/POA by phone; no answer.  Left  voicemail. Disposition Plan: Back to SNF once cr improving and culture result finalized Consults called:  Orthopedic surgery Dr. Rush Farmer Urology Dr Diona Fanti   Procedures:  none  Antibiotics:  rocephin   Objective: BP (!) 110/50 (BP Location: Left Arm)   Pulse 94   Temp 98.4 F (36.9 C) (Oral)   Resp 18   SpO2 95%   Intake/Output Summary (Last 24 hours) at 08/23/16 2026 Last data filed at 08/23/16 1725  Gross per 24 hour  Intake             1185 ml  Output             1000 ml  Net              185 ml   There were no vitals filed for this visit.  Exam:   General:  NAD, frail, demented  Cardiovascular: RRR  Respiratory: CTABL  Abdomen: Soft/ND/NT, positive BS  Musculoskeletal: contracture in legs  Neuro: dementia, oriented to self only  Data Reviewed: Basic Metabolic Panel:  Recent Labs Lab 08/21/16 1959 08/22/16 0618 08/23/16 0555  NA 147* 147* 150*  K 3.4* 3.6 3.2*  CL 106 110 118*  CO2 30 27 26   GLUCOSE 125* 101* 82  BUN 90* 83* 64*  CREATININE 2.96* 2.43* 1.77*  CALCIUM 8.8* 8.3* 7.9*   Liver Function Tests:  Recent Labs Lab 08/23/16 0555  AST 33  ALT 23  ALKPHOS 51  BILITOT 0.8  PROT 5.2*  ALBUMIN 1.9*   No results for input(s): LIPASE, AMYLASE in the last 168 hours. No results for input(s): AMMONIA in the last 168 hours. CBC:  Recent  Labs Lab 08/21/16 1959 08/22/16 0618 08/23/16 0555  WBC 16.0* 14.5* 7.5  NEUTROABS 12.4*  --   --   HGB 10.7* 10.4* 7.9*  HCT 33.6* 32.7* 25.0*  MCV 97.1 96.7 97.3  PLT 144* 151 115*   Cardiac Enzymes:   No results for input(s): CKTOTAL, CKMB, CKMBINDEX, TROPONINI in the last 168 hours. BNP (last 3 results) No results for input(s): BNP in the last 8760 hours.  ProBNP (last 3 results) No results for input(s): PROBNP in the last 8760 hours.  CBG: No results for input(s): GLUCAP in the last 168 hours.  Recent Results (from the past 240 hour(s))  Urine culture     Status: Abnormal    Collection Time: 08/21/16  9:52 PM  Result Value Ref Range Status   Specimen Description URINE, CLEAN CATCH  Final   Special Requests NONE  Final   Culture 40,000 COLONIES/mL YEAST (A)  Final   Report Status 08/23/2016 FINAL  Final  MRSA PCR Screening     Status: None   Collection Time: 08/22/16  9:40 AM  Result Value Ref Range Status   MRSA by PCR NEGATIVE NEGATIVE Final    Comment:        The GeneXpert MRSA Assay (FDA approved for NASAL specimens only), is one component of a comprehensive MRSA colonization surveillance program. It is not intended to diagnose MRSA infection nor to guide or monitor treatment for MRSA infections.      Studies: No results found.  Scheduled Meds: . amLODipine  5 mg Oral Daily  . cefTRIAXone (ROCEPHIN)  IV  1 g Intravenous QHS  . divalproex  250 mg Oral TID  . donepezil  10 mg Oral QHS  . enoxaparin (LOVENOX) injection  30 mg Subcutaneous Q24H  . escitalopram  10 mg Oral Daily  . feeding supplement (ENSURE ENLIVE)  237 mL Oral TID BM  . fluconazole  100 mg Oral Daily  . memantine  28 mg Oral Daily  . potassium chloride  40 mEq Oral BID  . QUEtiapine  12.5 mg Oral BID  . traMADol  50 mg Oral Q8H    Continuous Infusions: . sodium chloride 100 mL/hr at 08/23/16 1402     Time spent: 31mins  Allard Lightsey MD,  [facility] Pager [phone]. If 7PM-7AM, please contact night-coverage at [email or web address], password [facility] 08/23/2016, 8:26 PM  LOS: 1 day

## 2016-08-23 NOTE — Clinical Social Work Note (Addendum)
MSW attempted to contact patient's main contact/son, [name] to complete psychosocial assessment. MSW left a message for a returned phone call.   Patient was discharged on 11/9 from [facility] to SNF, [facility] where she was readmitted from.   FL-2 completed and faxed to [facility] for review.   MSW remains available as needed.  Glendon Axe, MSW [phone] 08/23/2016 1:03 PM

## 2016-08-23 NOTE — Consult Note (Signed)
[place] Nurse wound consult note Reason for Consult:Pressure Injury (3) in frail patient with dementia. Wound type: Deep Tissue Pressure Injury Pressure Ulcer POA: Yes Measurement: Left IT:  2.5cm x 1cm, Coccyx:  1cm x 4.5cm, Right IT: 3cm x 1.5cm  All areas are deep purple/maroon in color with mild epidermal sloughing.  This presentation is consistent with a deep tissue pressure injury which can evolve into a full thickness wound. Wound bed:As described above Drainage (amount, consistency, odor) None Periwound:Mild incontinence associated dermatitis; patient was incontinent of stool immediately prior to my assessment.  HOB is elevated for meal. Dressing procedure/placement/frequency:  I will provide a therapeutic mattress with low air loss feature and pressure redistribution heel boots.  Guidance is provided for Nursing via the Orders to keep HOB at or below a 30-degree angle except for meals and to turn and reposition from side to side-avoiding the supine position.  As she is stooling, I will not recommend a dressing, but will keep the areas covered with our house moisture barrier ointment. [place] nursing team will not follow, but will remain available to this patient, the nursing and medical teams.  Please re-consult if needed. Thanks, Maudie Flakes, MSN, RN, [place], Arther Abbott  Pager# [phone]

## 2016-08-23 NOTE — Progress Notes (Signed)
Initial Nutrition Assessment  DOCUMENTATION CODES:   Severe malnutrition in context of chronic illness  INTERVENTION:   Ensure Enlive po TID, each supplement provides 350 kcal and 20 grams of protein   NUTRITION DIAGNOSIS:   Malnutrition related to chronic illness as evidenced by severe depletion of body fat, severe depletion of muscle mass.   GOAL:   Patient will meet greater than or equal to 90% of their needs   MONITOR:   PO intake, Supplement acceptance  REASON FOR ASSESSMENT:   Consult Assessment of nutrition requirement/status  ASSESSMENT:   70 y.o. woman with a history of dementia, CKD II, EtOH abuse, melanoma, anxiety/depression, HTN, and GERD who was referred from her SNF facility for hip fx and AKI.    Met with patient in room today. Patient unable to really communicate but can answer simple yes/no questions. Patient reports that she does have an appetite. Patient eating 15-75% meals per chart. Wt loss of 7lbs(6%) in the past 8 months. This is not significant.   Medications reviewed and include: ceftriaxone, lovenox, tramadol, NaCl  Labs reviewed: Na 150(H), K 3.2(L), Cl 118(L), BUN 64(H), creat 1.77(H), Ca 7.9(L) adj 9.58 wnl, Alb 1.9(L) Hbg 7.9(L), Hct 25(L)  Nutrition-Focused physical exam completed. Findings are severe fat depletion, severe muscle depletion, and no edema.   Diet Order:  Diet Heart Room service appropriate? Yes; Fluid consistency: Thin  Skin:   unstageable pressure injury on sacrum, dry skin    Last BM:  11/26  Height:   Ht Readings from Last 1 Encounters:  08/09/16 5' 5" (1.651 m)    Weight:   Wt Readings from Last 1 Encounters:  08/09/16 110 lb 12.8 oz (50.3 kg)    Ideal Body Weight:  56.8 kg  BMI:  There is no height or weight on file to calculate BMI.  Estimated Nutritional Needs:   Kcal:  1350-1600kcal/day   Protein:  60-75g/day  Fluid:  1.5L/day   EDUCATION NEEDS:   No education needs identified at this  time   , RD, LDN  

## 2016-08-23 NOTE — NC FL2 (Signed)
[place] LEVEL OF CARE SCREENING TOOL     IDENTIFICATION  Patient Name: Tara Shaffer Birthdate: 13-Jan-1946 Sex: female Admission Date (Current Location): 08/21/2016  [facility] and [place] Number:  Herbalist and Address:  [facility],  [place] [address] [place]      Provider Number: [id number]  Attending Physician Name and Address:  Hosie Poisson, MD  Relative Name and Phone Number:       Current Level of Care: Hospital Recommended Level of Care: [place] Prior Approval Number:    Date Approved/Denied:   PASRR Number:  ([id number])  Discharge Plan: SNF    Current Diagnoses: Patient Active Problem List   Diagnosis Date Noted  . ARF (acute renal failure) ([place]) 08/21/2016  . Hip fracture ([place]) 08/21/2016  . UTI (urinary tract infection) 12/24/2015  . SIRS (systemic inflammatory response syndrome) ([place]) 12/24/2015  . AKI (acute kidney injury) ([place]) 12/23/2015  . Fracture of femoral neck, right ([place]) 12/23/2015  . Anemia of chronic disease 08/29/2015  . CKD (chronic kidney disease) stage 2, GFR 60-89 ml/min 08/29/2015  . Closed wound of head 08/25/2015  . Mood disorder ([place]) 06/11/2015  . Right leg swelling 03/21/2015  . DVT, femoral, chronic ([place]) 03/20/2015  . Pressure ulcer 03/14/2015  . Malnutrition of moderate degree ([place]) 03/14/2015  . Closed right hip fracture ([place]) 03/13/2015  . Recurrent falls 10/31/2014  . Impaired glucose tolerance 10/31/2014  . Scalp laceration 08/04/2014  . Posterior pain of right hip 12/19/2012  . SIADH (syndrome of inappropriate ADH production) ([place]) 04/13/2012  . Hypokalemia 03/21/2012  . Alcohol abuse 02/27/2012  . Insomnia 09/17/2011  . Dementia with behavioral disturbance 06/23/2011  . Preventative health care 02/01/2011  . ANXIETY DEPRESSION 06/12/2009  . ELECTROCARDIOGRAM, ABNORMAL 08/20/2008  . 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  DIVERTICULOSIS, COLON 08/04/2007  . MELANOMA, HX OF 08/04/2007  . HLD (hyperlipidemia) 06/24/2007  . Morbid obesity ([place]) 06/23/2007  . Anxiety state 06/23/2007  . Essential hypertension 06/23/2007  . ALLERGIC RHINITIS 06/23/2007  . GERD 06/23/2007  . Irritable bowel syndrome 06/23/2007  . POSTMENOPAUSAL STATUS 06/23/2007  . COLONIC POLYPS, HX OF 03/08/2002    Orientation RESPIRATION BLADDER Height & Weight     Self  Normal Incontinent, External catheter Weight:   Height:     BEHAVIORAL SYMPTOMS/MOOD NEUROLOGICAL BOWEL NUTRITION STATUS   (NONE )  (NONE ) Incontinent Diet (Heart Healthy )  AMBULATORY STATUS COMMUNICATION OF NEEDS Skin   Extensive Assist Verbally PU Stage and Appropriate Care, Other (Comment) (Unstageable, location: Sacrum, dressing changes: PRN)                       Personal Care Assistance Level of Assistance  Bathing, Feeding, Dressing Bathing Assistance: Maximum assistance Feeding assistance: Independent Dressing Assistance: Maximum assistance     Functional Limitations Info  Speech, Hearing, Sight   Hearing Info: Adequate Speech Info: Adequate    SPECIAL CARE FACTORS FREQUENCY  PT (By licensed PT)     PT Frequency: 3X              Contractures      Additional Factors Info  Code Status, Allergies Code Status Info: FULL CODE  Allergies Info: Codeine Phosphate            Current Medications (08/23/2016):  This is the current hospital active medication list Current Facility-Administered Medications  Medication Dose Route Frequency Provider Last Rate Last Dose  .  0.9 %  sodium chloride infusion   Intravenous Continuous Lily Kocher, MD 100 mL/hr at 08/23/16 0310    . acetaminophen (TYLENOL) tablet 650 mg  650 mg Oral Q6H PRN Lily Kocher, MD   650 mg at 08/22/16 [id number]   Or  . acetaminophen (TYLENOL) suppository 650 mg  650 mg Rectal Q6H PRN Lily Kocher, MD      . amLODipine (NORVASC) tablet 5 mg  5 mg Oral Daily Lily Kocher, MD    5 mg at 08/23/16 0940  . cefTRIAXone (ROCEPHIN) 1 g in dextrose 5 % 50 mL IVPB  1 g Intravenous QHS Quintella Reichert, MD 100 mL/hr at 08/22/16 2219 1 g at 08/22/16 2219  . divalproex (DEPAKOTE SPRINKLE) capsule 250 mg  250 mg Oral TID Lily Kocher, MD   250 mg at 08/23/16 0940  . donepezil (ARICEPT) tablet 10 mg  10 mg Oral QHS Florencia Reasons, MD   10 mg at 08/22/16 2220  . enoxaparin (LOVENOX) injection 30 mg  30 mg Subcutaneous Q24H Lily Kocher, MD   30 mg at 08/23/16 0939  . escitalopram (LEXAPRO) tablet 10 mg  10 mg Oral Daily Lily Kocher, MD   10 mg at 08/23/16 0940  . feeding supplement (ENSURE ENLIVE) (ENSURE ENLIVE) liquid 237 mL  237 mL Oral TID BM Hosie Poisson, MD      . fluconazole (DIFLUCAN) tablet 100 mg  100 mg Oral Daily Florencia Reasons, MD      . memantine (NAMENDA XR) 24 hr capsule 28 mg  28 mg Oral Daily Florencia Reasons, MD   28 mg at 08/22/16 0917  . ondansetron (ZOFRAN) tablet 4 mg  4 mg Oral Q6H PRN Lily Kocher, MD       Or  . ondansetron [facility]) injection 4 mg  4 mg Intravenous Q6H PRN Lily Kocher, MD      . potassium chloride SA (K-DUR,KLOR-CON) CR tablet 40 mEq  40 mEq Oral BID Hosie Poisson, MD      . QUEtiapine (SEROQUEL) tablet 12.5 mg  12.5 mg Oral BID Lily Kocher, MD   12.5 mg at 08/23/16 0942  . traMADol (ULTRAM) tablet 50 mg  50 mg Oral Q8H Lily Kocher, MD   50 mg at 08/22/16 2220     Discharge Medications: Please see discharge summary for a list of discharge medications.  Relevant Imaging Results:  Relevant Lab Results:   Additional Information SSN [id number]  [place]on Axe A

## 2016-08-24 ENCOUNTER — Inpatient Hospital Stay (HOSPITAL_COMMUNITY): Payer: Medicare Other

## 2016-08-24 LAB — BASIC METABOLIC PANEL
ANION GAP: 3 — AB (ref 5–15)
BUN: 46 mg/dL — ABNORMAL HIGH (ref 6–20)
CALCIUM: 7.8 mg/dL — AB (ref 8.9–10.3)
CO2: 25 mmol/L (ref 22–32)
Chloride: 116 mmol/L — ABNORMAL HIGH (ref 101–111)
Creatinine, Ser: 1.43 mg/dL — ABNORMAL HIGH (ref 0.44–1.00)
GFR, EST AFRICAN AMERICAN: 42 mL/min — AB (ref >60)
GFR, EST NON AFRICAN AMERICAN: 36 mL/min — AB (ref >60)
Glucose, Bld: 303 mg/dL — ABNORMAL HIGH (ref 65–99)
Potassium: 4 mmol/L (ref 3.5–5.1)
Sodium: 144 mmol/L (ref 135–145)

## 2016-08-24 LAB — CBC
HEMATOCRIT: 24.7 % — AB (ref 36.0–46.0)
Hemoglobin: 7.7 g/dL — ABNORMAL LOW (ref 12.0–15.0)
MCH: 31.8 pg (ref 26.0–34.0)
MCHC: 31.2 g/dL (ref 30.0–36.0)
MCV: 102.1 fL — ABNORMAL HIGH (ref 78.0–100.0)
PLATELETS: 130 10*3/uL — AB (ref 150–400)
RBC: 2.42 MIL/uL — ABNORMAL LOW (ref 3.87–5.11)
RDW: 15.2 % (ref 11.5–15.5)
WBC: 6.4 10*3/uL (ref 4.0–10.5)

## 2016-08-24 LAB — IRON AND TIBC
IRON: 49 ug/dL (ref 28–170)
Saturation Ratios: 35 % — ABNORMAL HIGH (ref 10.4–31.8)
TIBC: 140 ug/dL — ABNORMAL LOW (ref 250–450)
UIBC: 91 ug/dL

## 2016-08-24 LAB — OCCULT BLOOD X 1 CARD TO LAB, STOOL: FECAL OCCULT BLD: NEGATIVE

## 2016-08-24 LAB — RETICULOCYTES
RBC.: 2.42 MIL/uL — ABNORMAL LOW (ref 3.87–5.11)
RETIC COUNT ABSOLUTE: 72.6 10*3/uL (ref 19.0–186.0)
Retic Ct Pct: 3 % (ref 0.4–3.1)

## 2016-08-24 LAB — FERRITIN: Ferritin: 450 ng/mL — ABNORMAL HIGH (ref 11–307)

## 2016-08-24 LAB — FOLATE: FOLATE: 33.6 ng/mL (ref >5.9)

## 2016-08-24 LAB — VITAMIN B12: Vitamin B-12: 458 pg/mL (ref 180–914)

## 2016-08-24 NOTE — Clinical Social Work Note (Signed)
Clinical Social Work Assessment  Patient Details  Name: Tara Shaffer MRN: [id number] Date of Birth: Nov 15, 1945  Date of referral:  08/24/16               Reason for consult:  Facility Placement                Permission sought to share information with:  Family Supports Permission granted to share information::     Name::     [facility] [place] or Delfino Lovett [name]) [place]::  SNF  Relationship::  Sons  Contact Information:  [name] [phone] mobile and [name] [phone]  Housing/Transportation Living arrangements for the past 2 months:  [place] of Information:  Adult Children Patient Interpreter Needed:  None Criminal Activity/Legal Involvement Pertinent to Current Situation/Hospitalization:  No - Comment as needed Significant Relationships:  Adult Children Lives with:  Facility Resident Do you feel safe going back to the place where you live?  No Need for family participation in patient care:  Yes (Comment)  Care giving concerns: Patient is from [place] and rehab where she has been receiving rehab after falling and fracturing her help.    Social Worker assessment / plan:  LCSWA spoke with patient son [name] about patient disposition. Patient son reports patient plan is discharge back to SNF once medically stable. He reports prior to patient going to [place] she was at a ALF and at this time the facility reports they cannot handle the care the patient needs, therefore he has been arranging for pt. To stay [facility].  LCSWA will assist with patient disposition back to facility.   Employment status:  Retired Forensic scientist:  Medicare PT Recommendations:  [place] / Referral to community resources:  [place]  Patient/Family's Response to care: Agreeable  Patient/Family's Understanding of and Emotional Response to Diagnosis, Current Treatment, and Prognosis:  Expects  patient to return to facility once medically stable.   Emotional Assessment Appearance:  Appears stated age Attitude/Demeanor/Rapport:   (Calm) Affect (typically observed):  Calm, Accepting Orientation:  Oriented to Self, Oriented to Place Alcohol / Substance use:  Not Applicable Psych involvement (Current and /or in the community):  No (Comment)  Discharge Needs  Concerns to be addressed:  Discharge Planning Concerns Readmission within the last 30 days:  No Current discharge risk:  None Barriers to Discharge:  No Barriers Identified   Lia Hopping, LCSW 08/24/2016, 2:18 PM

## 2016-08-24 NOTE — Progress Notes (Addendum)
PROGRESS NOTE  Tara Shaffer [id number] DOB: [birth year] DOA: 08/21/2016 PCP: [name], MD  HPI/Recap of past 24 hours:  Non communicative.   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  urology consulted, recommended chronic indwelling foley and repeat US in the next few weeks, detail please refer to urology consult note Repeat creatinine is much improved. Its value is 1.43.  Repeat US of the renal shows improvement in the hydronephrosis.   Hypokalemia: replete as needed and repeat in am shows improvement.   Anemia: drop of hemoglobin from 10.4 to 7.9 to 7.7 . Anemia panel ordered  Shows normal parameters.. Repeat hemoglobin am dropped to 7.7. Stool for occult blood ordered and is negative.  CT abdomen and pelvis ordered to evaluate for pelvic hematoma.   Hypernatremia: likely from dehydration on ivf, change to dextrose fluids. Resolved.   Left hip fracture --No surgery anticipated.  Input from Dr. Rush Farmer appreciated. --Analgesics as needed; on tramadol at baseline --Nonambulatory at baseline   Stage 3 sacral decubitus ulcer, POA --Wound care consult --Nutrition consult   HTN Well controlled.  --Amlodipine   Anxiety/depression --Continue lexapro --Can use benzo prn if  needed  Dementia/FTT  -SNF resident,  --continue Nemenda, Aricept, seroquel, depakote    DVT prophylaxis: Lovenox Code Status: FULL (Presumed)  No advance directive on file at SNF. Family Communication: discussed with son over the phone and updated him.  Disposition Plan: Back to SNF once cr improving and culture result finalized Consults called:  Orthopedic surgery Dr. Rush Farmer Urology Dr Diona Fanti   Procedures:  none  Antibiotics:  Diflucan.    Objective: BP (!) 129/57   Pulse 84   Temp 97.8 F (36.6 C) (Axillary)   Resp (!) 183   SpO2 97%   Intake/Output Summary (Last 24 hours) at 08/24/16 1343 Last data filed at 08/24/16 1149  Gross per 24 hour  Intake          1101.25 ml  Output             1150 ml  Net           -48.75 ml   There were no vitals filed for this visit.  Exam:   General:  NAD, frail, demented  Cardiovascular: RRR  Respiratory: CTABL  Abdomen: Soft/ND/NT, positive BS  Musculoskeletal: contracture in legs  Neuro: dementia, oriented to self only  Data Reviewed: Basic Metabolic Panel:  Recent Labs Lab 08/21/16 1959 08/22/16 0618 08/23/16 0555 08/24/16 0540  NA 147* 147* 150* 144  K 3.4* 3.6 3.2* 4.0  CL 106 110 118* 116*  CO2 30 27 26 25   GLUCOSE 125* 101* 82 303*  BUN 90* 83* 64* 46*  CREATININE 2.96* 2.43* 1.77* 1.43*  CALCIUM 8.8* 8.3* 7.9* 7.8*   Liver Function Tests:  Recent Labs Lab  08/23/16 0555  AST 33  ALT 23  ALKPHOS 51  BILITOT 0.8  PROT 5.2*  ALBUMIN 1.9*   No results for input(s): LIPASE, AMYLASE in the last 168 hours. No results for input(s): AMMONIA in the last 168 hours. CBC:  Recent Labs Lab 08/21/16 1959 08/22/16 0618 08/23/16 0555 08/24/16 0540  WBC 16.0* 14.5* 7.5 6.4  NEUTROABS 12.4*  --   --   --   HGB 10.7* 10.4* 7.9* 7.7*  HCT 33.6* 32.7* 25.0* 24.7*  MCV 97.1 96.7 97.3 102.1*  PLT 144* 151 115* 130*   Cardiac Enzymes:   No results for input(s): CKTOTAL, CKMB,  CKMBINDEX, TROPONINI in the last 168 hours. BNP (last 3 results) No results for input(s): BNP in the last 8760 hours.  ProBNP (last 3 results) No results for input(s): PROBNP in the last 8760 hours.  CBG: No results for input(s): GLUCAP in the last 168 hours.  Recent Results (from the past 240 hour(s))  Urine culture     Status: Abnormal   Collection Time: 08/21/16  9:52 PM  Result Value Ref Range Status   Specimen Description URINE, CLEAN CATCH  Final   Special Requests NONE  Final   Culture 40,000 COLONIES/mL YEAST (A)  Final   Report Status 08/23/2016 FINAL  Final  MRSA PCR Screening     Status: None   Collection Time: 08/22/16  9:40 AM  Result Value Ref Range Status   MRSA by PCR NEGATIVE NEGATIVE Final    Comment:        The GeneXpert MRSA Assay (FDA approved for NASAL specimens only), is one component of a comprehensive MRSA colonization surveillance program. It is not intended to diagnose MRSA infection nor to guide or monitor treatment for MRSA infections.      Studies: US Renal Port  Result Date: 08/23/2016 CLINICAL DATA:  Follow-up on hydronephrosis after bladder drainage. EXAM: RENAL / URINARY TRACT ULTRASOUND COMPLETE COMPARISON:  08/22/2016 FINDINGS: Right Kidney: Length: 10 cm. Mild residual hydronephrosis with significant decompression since previous study. Normal renal parenchymal echotexture and thickness. Left Kidney: Length: 8.6 cm. Limited visualization of the left kidney due to poor penetration and overlying bowel and rib shadows. Interval resolution of hydronephrosis. No significant residual hydronephrosis. Parenchymal thickness appears intact. Bladder: Bladder is decompressed with a Foley catheter. IMPRESSION: Significant decrease in hydronephrosis since previous study with mild residual hydronephrosis on the right and no significant hydronephrosis on the left. Bladder is decompressed with a Foley catheter. Electronically Signed   By: Lucienne Capers  M.D.   On: 08/23/2016 23:45    Scheduled Meds: . amLODipine  5 mg Oral Daily  . divalproex  250 mg Oral TID  . donepezil  10 mg Oral QHS  . escitalopram  10 mg Oral Daily  . feeding supplement (ENSURE ENLIVE)  237 mL Oral TID BM  . fluconazole  100 mg Oral Daily  . memantine  28 mg Oral Daily  . QUEtiapine  12.5 mg Oral BID  . traMADol  50 mg Oral Q8H    Continuous Infusions: . dextrose 75 mL/hr at 08/24/16 1300     Time spent: 60mins  Cheria Sadiq MD,  [facility] Pager [phone]. If 7PM-7AM, please contact night-coverage at [email or web address], password [facility] 08/24/2016, 1:43 PM  LOS: 2 days

## 2016-08-25 DIAGNOSIS — N1330 Unspecified hydronephrosis: Secondary | ICD-10-CM

## 2016-08-25 DIAGNOSIS — S72001D Fracture of unspecified part of neck of right femur, subsequent encounter for closed fracture with routine healing: Secondary | ICD-10-CM

## 2016-08-25 DIAGNOSIS — E43 Unspecified severe protein-calorie malnutrition: Secondary | ICD-10-CM

## 2016-08-25 LAB — CBC WITH DIFFERENTIAL/PLATELET
BASOS ABS: 0 10*3/uL (ref 0.0–0.1)
BASOS PCT: 0 %
EOS ABS: 0.3 10*3/uL (ref 0.0–0.7)
EOS PCT: 4 %
HEMATOCRIT: 26.7 % — AB (ref 36.0–46.0)
Hemoglobin: 8.4 g/dL — ABNORMAL LOW (ref 12.0–15.0)
Lymphocytes Relative: 21 %
Lymphs Abs: 1.6 10*3/uL (ref 0.7–4.0)
MCH: 31.6 pg (ref 26.0–34.0)
MCHC: 31.5 g/dL (ref 30.0–36.0)
MCV: 100.4 fL — ABNORMAL HIGH (ref 78.0–100.0)
MONO ABS: 1 10*3/uL (ref 0.1–1.0)
Monocytes Relative: 13 %
NEUTROS ABS: 4.9 10*3/uL (ref 1.7–7.7)
Neutrophils Relative %: 62 %
PLATELETS: 159 10*3/uL (ref 150–400)
RBC: 2.66 MIL/uL — ABNORMAL LOW (ref 3.87–5.11)
RDW: 15 % (ref 11.5–15.5)
WBC: 7.8 10*3/uL (ref 4.0–10.5)

## 2016-08-25 LAB — BASIC METABOLIC PANEL
ANION GAP: 6 (ref 5–15)
BUN: 33 mg/dL — ABNORMAL HIGH (ref 6–20)
CALCIUM: 8.3 mg/dL — AB (ref 8.9–10.3)
CO2: 25 mmol/L (ref 22–32)
CREATININE: 1.19 mg/dL — AB (ref 0.44–1.00)
Chloride: 113 mmol/L — ABNORMAL HIGH (ref 101–111)
GFR calc Af Amer: 53 mL/min — ABNORMAL LOW (ref >60)
GFR, EST NON AFRICAN AMERICAN: 45 mL/min — AB (ref >60)
GLUCOSE: 93 mg/dL (ref 65–99)
Potassium: 3.8 mmol/L (ref 3.5–5.1)
Sodium: 144 mmol/L (ref 135–145)

## 2016-08-25 MED ORDER — ENOXAPARIN SODIUM 30 MG/0.3ML ~~LOC~~ SOLN
30.0000 mg | SUBCUTANEOUS | Status: DC
  Administered 2016-08-25: 30 mg via SUBCUTANEOUS
  Filled 2016-08-25: qty 0.3

## 2016-08-25 NOTE — Progress Notes (Signed)
PROGRESS NOTE    Tara Shaffer  [id number] DOB: 1946-02-20 DOA: 08/21/2016 PCP: [name], MD    Brief Narrative:  Patient is a 70 year old woman history of dementia, alcohol abuse, melanoma, anxiety/depression, hypertension, gastroesophageal reflux disease who was sent from the skilled nursing facility for evaluation of left hip pain after sustaining a fall during transfer one week prior to admission. Patient nonambulatory at baseline. Patient is a   Assessment & Plan:   Principal Problem:   ARF (acute renal failure) ([place]) Active Problems:   Essential hypertension   Dementia with behavioral disturbance   Pressure ulcer   CKD (chronic kidney disease) stage 2, GFR 60-89 ml/min   AKI (acute kidney injury) ([place])   UTI (urinary tract infection)   Hip fracture ([facility])   Protein-calorie malnutrition, severe   #1 acute on chronic kidney disease stage III Likely secondary to a post renal azotemia secondary to moderate bilateral hydronephrosis noted on renal ultrasound. Patient status post Foley catheter placement and urology consultation were recommending probable long-term Foley catheter placement. Repeat renal ultrasound with improvement with hydronephrosis. Renal function trending down and currently likely has baseline. Follow.  #2 bilateral hydronephrosis Status post Foley catheter placement. Patient with good urine output. Repeat renal ultrasound with improvement with hydronephrosis. Patient will likely require long-term Foley catheter. Outpatient follow-up with urology.  #3 acute left femoral fracture/chronic right femoral fracture Secondary to fall. Per ED physician's report on admission ED physician spoke with Dr. Ninfa Linden of orthopedics at which point in time it was relayed that patient was bedbound prior to admission and fell while trying to transfer one week prior to admission. The ED physician's note is states that orthopedics had recommended nonoperative therapy at  this time. PT/OT. Patient likely will need skilled nursing facility placement. Follow.  #4 severe protein calorie malnutrition Continue nutritional supplementation.  #5 ?? urinary tract infection/candiduria Urine cultures positive for yeast. Continue Diflucan.  #6 dementia/FTT Stable. Continue Aricept, Depakote, Namenda, Seroquel.  #7 hypernatremia Secondary to dehydration. Improved with hydration.  #8 stage III sacral decubitus ulcer  Patient has been seen by wound care who feel this is consistent with deep tissue pressure injury which can involve into a full-thickness wound. Wound with no drainage. Frequent turning. Continue moisture barrier ointment.  #9 anemia Patient with drop in the hemoglobin from 10.4 down to 7.7 on 08/24/2016. Follow H&H. Transfusion threshold hemoglobin less than 7. FOBT negative. No overt bleeding. A be dilutional. Follow.  #10 depression/anxiety Continue Lexapro.  #11 hypertension Continue Norvasc.      DVT prophylaxis: scd Code Status: Full Family Communication: Updated patient. No family at bedside. Disposition Plan: Back to skilled nursing facility when medically stable for the next 24-48 hours.   Consultants:   Urology: Dr.Dahlstedt 08/22/2016  Procedures:   CT abdomen and pelvis 08/24/2016  Plain films of the left hip and pelvis 08/21/2016  Renal US 08/22/2016, 08/23/2016    Antimicrobials:   IV Rocephin 08/21/2016>>>> 08/24/2016     Subjective: Patient sleeping have a easily arousable. No chest pain. No shortness of breath. Minimally verbal.  Objective: Vitals:   08/24/16 1153 08/24/16 1515 08/24/16 2059 08/25/16 0512  BP: (!) 129/57 (!) 105/55 123/64 122/60  Pulse:  69 69 69  Resp:  18 16 19   Temp:  97.9 F (36.6 C) 97.8 F (36.6 C) 97.3 F (36.3 C)  TempSrc:  Axillary Oral Oral  SpO2:  98% 96% 97%    Intake/Output Summary (Last 24 hours) at 08/25/16 1255 Last  data filed at 08/25/16 0600  Gross per 24 hour    Intake             2240 ml  Output              775 ml  Net             1465 ml   There were no vitals filed for this visit.  Examination:  General exam: Appears calm and comfortable  Respiratory system: Clear to auscultation. Respiratory effort normal. Cardiovascular system: S1 & S2 heard, RRR. No JVD, murmurs, rubs, gallops or clicks. No pedal edema. Gastrointestinal system: Abdomen is nondistended, soft and nontender. No organomegaly or masses felt. Normal bowel sounds heard. Central nervous system: Alert and oriented. No focal neurological deficits. Extremities: Symmetric 5 x 5 power. Skin: No rashes, lesions or ulcers Psychiatry: Judgement and insight appear normal. Mood & affect depressed.    Data Reviewed: I have personally reviewed following labs and imaging studies  CBC:  Recent Labs Lab 08/21/16 1959 08/22/16 0618 08/23/16 0555 08/24/16 0540 08/25/16 0834  WBC 16.0* 14.5* 7.5 6.4 7.8  NEUTROABS 12.4*  --   --   --  4.9  HGB 10.7* 10.4* 7.9* 7.7* 8.4*  HCT 33.6* 32.7* 25.0* 24.7* 26.7*  MCV 97.1 96.7 97.3 102.1* 100.4*  PLT 144* 151 115* 130* [id number]   Basic Metabolic Panel:  Recent Labs Lab 08/21/16 1959 08/22/16 0618 08/23/16 0555 08/24/16 0540 08/25/16 0834  NA 147* 147* 150* 144 144  K 3.4* 3.6 3.2* 4.0 3.8  CL 106 110 118* 116* 113*  CO2 30 27 26 25 25   GLUCOSE 125* 101* 82 303* 93  BUN 90* 83* 64* 46* 33*  CREATININE 2.96* 2.43* 1.77* 1.43* 1.19*  CALCIUM 8.8* 8.3* 7.9* 7.8* 8.3*   GFR: CrCl cannot be calculated (Unknown ideal weight.). Liver Function Tests:  Recent Labs Lab 08/23/16 0555  AST 33  ALT 23  ALKPHOS 51  BILITOT 0.8  PROT 5.2*  ALBUMIN 1.9*   No results for input(s): LIPASE, AMYLASE in the last 168 hours. No results for input(s): AMMONIA in the last 168 hours. Coagulation Profile: No results for input(s): INR, PROTIME in the last 168 hours. Cardiac Enzymes: No results for input(s): CKTOTAL, CKMB, CKMBINDEX, TROPONINI  in the last 168 hours. BNP (last 3 results) No results for input(s): PROBNP in the last 8760 hours. HbA1C: No results for input(s): HGBA1C in the last 72 hours. CBG: No results for input(s): GLUCAP in the last 168 hours. Lipid Profile: No results for input(s): CHOL, HDL, LDLCALC, TRIG, CHOLHDL, LDLDIRECT in the last 72 hours. Thyroid Function Tests: No results for input(s): TSH, T4TOTAL, FREET4, T3FREE, THYROIDAB in the last 72 hours. Anemia Panel:  Recent Labs  08/24/16 0540  VITAMINB12 458  FOLATE 33.6  FERRITIN 450*  TIBC 140*  IRON 49  RETICCTPCT 3.0   Sepsis Labs: No results for input(s): PROCALCITON, LATICACIDVEN in the last 168 hours.  Recent Results (from the past 240 hour(s))  Urine culture     Status: Abnormal   Collection Time: 08/21/16  9:52 PM  Result Value Ref Range Status   Specimen Description URINE, CLEAN CATCH  Final   Special Requests NONE  Final   Culture 40,000 COLONIES/mL YEAST (A)  Final   Report Status 08/23/2016 FINAL  Final  MRSA PCR Screening     Status: None   Collection Time: 08/22/16  9:40 AM  Result Value Ref Range Status   MRSA by  PCR NEGATIVE NEGATIVE Final    Comment:        The GeneXpert MRSA Assay (FDA approved for NASAL specimens only), is one component of a comprehensive MRSA colonization surveillance program. It is not intended to diagnose MRSA infection nor to guide or monitor treatment for MRSA infections.          Radiology Studies: Ct Abdomen Pelvis Wo Contrast  Result Date: 08/24/2016 CLINICAL DATA:  70 y/o F; decreasing hemoglobin with concern for pelvic hematoma. EXAM: CT ABDOMEN AND PELVIS WITHOUT CONTRAST TECHNIQUE: Multidetector CT imaging of the abdomen and pelvis was performed following the standard protocol without IV contrast. COMPARISON:  08/23/2016 renal ultrasound. 08/03/2016 CT abdomen and pelvis. FINDINGS: Lower chest: Subsegmental basilar atelectasis. Trace bilateral pleural effusions. Decreased  attenuation of cardiac blood pool indicates anemia. Mild coronary artery calcification. Hepatobiliary: No focal liver lesion. Gradient increased attenuation within the gallbladder may represent sludge or small stones. No intra or extrahepatic biliary ductal dilatation. Pancreas: Unremarkable. No pancreatic ductal dilatation or surrounding inflammatory changes. Spleen: Normal in size without focal abnormality. Adrenals/Urinary Tract: Mild bilateral hydronephrosis similar to prior renal sonogram. In the bladder is collapsed around a Foley catheter. No focal renal lesion. Stomach/Bowel: No obstructive or inflammatory changes of bowel. Large volume of stool in the rectal vault may represent constipation. Vascular/Lymphatic: Aortic atherosclerosis. No enlarged abdominal or pelvic lymph nodes. Reproductive: Stable left adnexal pelvic mass measuring up to 4.6 cm. The right adnexa and uterus are unremarkable. Other: No abdominal wall hernia or abnormality. No abdominopelvic ascites. Musculoskeletal: Chronic overriding right femoral neck fracture without femoral head dislocation and acute left-sided intratrochanteric overriding proximal femur fracture without femoral head dislocation again seen. Multiple chronic fracture deformities of the superior and inferior pubic rami. Stable compression deformities of the T12 and L2 superior endplates. There is stranding of subcutaneous fat and mild swelling of the left-sided upper thigh muscles probably representing reactive edema. No discrete hematoma is identified. IMPRESSION: 1. No hematoma is identified. 2. Trace bilateral pleural effusions. 3. Gallbladder sludge or small stones. No evidence for cholecystitis. 4. Mild persistent hydronephrosis stable from prior renal ultrasound given differences in technique. 5. Chronic right and acute left proximal femoral fractures. Fat stranding and mild swelling of left upper thigh muscles is probably reactive edema. No discrete hematoma  identified. 6. Stable T12 and L2 mild compression deformities. 7. **An incidental finding of potential clinical significance has been found. Left adnexal mass measuring up to 4.6 cm, further characterization with ultrasound is recommended. This recommendation follows ACR consensus guidelines: White Paper of the ACR Incidental Findings Committee II on Adnexal Findings. J Am Coll Radiol [phone].** Electronically Signed   By: Kristine Garbe M.D.   On: 08/24/2016 22:34   US Renal Port  Result Date: 08/23/2016 CLINICAL DATA:  Follow-up on hydronephrosis after bladder drainage. EXAM: RENAL / URINARY TRACT ULTRASOUND COMPLETE COMPARISON:  08/22/2016 FINDINGS: Right Kidney: Length: 10 cm. Mild residual hydronephrosis with significant decompression since previous study. Normal renal parenchymal echotexture and thickness. Left Kidney: Length: 8.6 cm. Limited visualization of the left kidney due to poor penetration and overlying bowel and rib shadows. Interval resolution of hydronephrosis. No significant residual hydronephrosis. Parenchymal thickness appears intact. Bladder: Bladder is decompressed with a Foley catheter. IMPRESSION: Significant decrease in hydronephrosis since previous study with mild residual hydronephrosis on the right and no significant hydronephrosis on the left. Bladder is decompressed with a Foley catheter. Electronically Signed   By: Lucienne Capers M.D.   On: 08/23/2016 23:45  Scheduled Meds: . amLODipine  5 mg Oral Daily  . divalproex  250 mg Oral TID  . donepezil  10 mg Oral QHS  . escitalopram  10 mg Oral Daily  . feeding supplement (ENSURE ENLIVE)  237 mL Oral TID BM  . fluconazole  100 mg Oral Daily  . memantine  28 mg Oral Daily  . QUEtiapine  12.5 mg Oral BID  . traMADol  50 mg Oral Q8H   Continuous Infusions: . dextrose 75 mL/hr at 08/24/16 1300     LOS: 3 days    Time spent: 43 minutes    Anacarolina Evelyn, MD [facility] Pager  [phone]  If 7PM-7AM, please contact night-coverage [email or web address] Password [facility] 08/25/2016, 12:55 PM

## 2016-08-26 LAB — CBC
HCT: 28 % — ABNORMAL LOW (ref 36.0–46.0)
Hemoglobin: 8.9 g/dL — ABNORMAL LOW (ref 12.0–15.0)
MCH: 31.4 pg (ref 26.0–34.0)
MCHC: 31.8 g/dL (ref 30.0–36.0)
MCV: 98.9 fL (ref 78.0–100.0)
PLATELETS: 177 10*3/uL (ref 150–400)
RBC: 2.83 MIL/uL — ABNORMAL LOW (ref 3.87–5.11)
RDW: 14.9 % (ref 11.5–15.5)
WBC: 10 10*3/uL (ref 4.0–10.5)

## 2016-08-26 LAB — BASIC METABOLIC PANEL
Anion gap: 6 (ref 5–15)
BUN: 27 mg/dL — ABNORMAL HIGH (ref 6–20)
CALCIUM: 8.3 mg/dL — AB (ref 8.9–10.3)
CO2: 23 mmol/L (ref 22–32)
CREATININE: 1.08 mg/dL — AB (ref 0.44–1.00)
Chloride: 111 mmol/L (ref 101–111)
GFR calc Af Amer: 59 mL/min — ABNORMAL LOW (ref >60)
GFR, EST NON AFRICAN AMERICAN: 51 mL/min — AB (ref >60)
GLUCOSE: 111 mg/dL — AB (ref 65–99)
POTASSIUM: 3.6 mmol/L (ref 3.5–5.1)
SODIUM: 140 mmol/L (ref 135–145)

## 2016-08-26 MED ORDER — QUETIAPINE FUMARATE 25 MG PO TABS: 12.5000 mg | ORAL_TABLET | Freq: Two times a day (BID) | ORAL | 3 refills | Status: DC

## 2016-08-26 MED ORDER — TRAMADOL HCL 50 MG PO TABS: 50.0000 mg | ORAL_TABLET | Freq: Three times a day (TID) | ORAL | 0 refills | Status: DC

## 2016-08-26 MED ORDER — LORAZEPAM 0.5 MG PO TABS: 0.5000 mg | ORAL_TABLET | Freq: Three times a day (TID) | ORAL | 0 refills | Status: DC | PRN

## 2016-08-26 MED ORDER — ENOXAPARIN SODIUM 30 MG/0.3ML ~~LOC~~ SOLN: 30.0000 mg | SUBCUTANEOUS | Status: DC

## 2016-08-26 NOTE — Care Management Important Message (Signed)
Important Message  Patient Details IM Letter given to Rhonda/Case Manager to present to Patient. Name: Tara Shaffer MRN: [id number] Date of Birth: Sep 26, 1946   Medicare Important Message Given:  Yes    Camillo Flaming 08/26/2016, 11:48 AMImportant Message  Patient Details  Name: Tara Shaffer MRN: [id number] Date of Birth: April 17, 1946   Medicare Important Message Given:  Yes    Camillo Flaming 08/26/2016, 11:48 AM

## 2016-08-26 NOTE — Progress Notes (Signed)
Date: August 26, 2016 Discharge orders checked for needs. No case management needs present at time of discharge.  Discharge to SNF. Velva Harman, RN, BSN, [place]   [phone]

## 2016-08-26 NOTE — Progress Notes (Signed)
Pt awaiting transportation to SNF.

## 2016-08-26 NOTE — Discharge Summary (Signed)
Physician Discharge Summary  Tara Shaffer [id number] DOB: [birth year] DOA: 08/21/2016  PCP: [name], MD  Admit date: 08/21/2016 Discharge date: 08/26/2016  Time spent: 65 minutes  Recommendations for Outpatient Follow-up:  1. Follow-up with Dr.Dahlstedt, urology 09/16/2016 at 11 AM for follow-up on bilateral hydronephrosis. 2. Follow-up with M.D. at skilled nursing facility. Patient will need a basic metabolic profile done in 1 week to follow-up on electrolytes and renal function.   Discharge Diagnoses:  Principal Problem:   ARF (acute renal failure) (HCC) Active Problems:   Essential hypertension   Dementia with behavioral disturbance   Pressure ulcer   CKD (chronic kidney disease) stage 2, GFR 60-89 ml/min   AKI (acute kidney injury) ([place])   UTI (urinary tract infection)   Hip fracture (HCC)   Protein-calorie malnutrition, severe   Hydronephrosis   Discharge Condition: Stable  Diet recommendation: Heart healthy  There were no vitals filed for this visit.  History of present illness:  Per Dr Tara Shaffer is a 70 y.o. woman with a history of dementia, EtOH abuse, melanoma, anxiety/depression, HTN, and GERD who was referred from her SNF facility for evaluation of left hip pain after sustaining a fall during a transfer attempt one week ago.  She is nonambulatory at baseline.  She was alone in the ED and was not very communicative.  Her son was not available by phone.  ED Course: Xray showed new displaced intertrochanteric fracture of the left femur without dislocation. There was also evidence of old femoral neck fracture on the right as well as old bilateral pelvic fractures.  Orthopedic surgery consulted from the ED.  At this point, her injuries were considered nonoperable.  Conservative approach recommended, particularly since she was already nonambulatory at baseline.  The patient was actually been referred for admission for AKI and probable UTI.   Creatinine almost 3, up from baseline around 1, and her BUN is elevated to 90.  Sodium 147.  Potassium 3.4.  WBC count 16.  Hgb 10.7 and appearred stable/at baseline.  U/A showed large leukocytes, TNTC WBC, few bacteria, and yeast.    IV rocephin and one time dose of fluconazole ordered at time of admission.  She has received 1L of NS in the ED.  Hospital Course:  #1 acute on chronic kidney disease stage III Likely secondary to a post renal azotemia secondary to moderate bilateral hydronephrosis noted on renal ultrasound. Patient status post Foley catheter placement and urology consultation who recommending probable long-term Foley catheter placement. Repeat renal ultrasound with improvement with hydronephrosis. Renal function trended down from 2.96 on admission down to 1.08 by day of discharge. Patient's Demadex was discontinued. Outpatient follow-up.  #2 bilateral hydronephrosis Status post Foley catheter placement. Urology was consulted and patient was seen in consultation by Dr.Dahlstedt  who recommended Foley catheter (perhaps long-term SNF short-term as it was quite possible that patient has high pressure bladder and in adequate upper tract drainage. Foley catheter was placed patient had good urine output. Repeat renal ultrasound with improvement with hydronephrosis. Patient will  be discharged to skilled nursing facility with Foley catheter in place and outpatient follow-up with urology in about 3 weeks.   #3 acute left femoral fracture/chronic right femoral fracture Secondary to fall. Per ED physician's report on admission ED physician spoke with Dr. Ninfa Linden of orthopedics at which point in time it was relayed that patient was bedbound prior to admission and fell while trying to transfer one week prior to admission. The ED  physician's note stated that orthopedics had recommended nonoperative therapy at this time. PT/OT. Patient likely will need skilled nursing facility placement.  #4  severe protein calorie malnutrition Patient was placed on nutritional supplementation.  #5 ?? urinary tract infection/candiduria Urine cultures positive for yeast. Patient received a full course of  Diflucan.  #6 dementia/FTT Stable. Patient was maintained on home regimen of  Aricept, Depakote, Namenda. Seroquel dose was changed to 12.5 mg twice daily.  #7 hypernatremia Secondary to dehydration. Improved with hydration.  #8 stage III sacral decubitus ulcer  Patient has been seen by wound care who feel this was consistent with deep tissue pressure injury which can involve into a full-thickness wound. Wound with no drainage. Frequent turning. Patient was placed on moisture barrier ointment.  #9 anemia Patient with drop in the hemoglobin from 10.4 down to 7.7 on 08/24/2016. Follow H&H. Transfusion threshold hemoglobin less than 7. FOBT negative. No overt bleeding. Likely dilutional. H/H stabilized.  #10 depression/anxiety Continued on home regimen of Lexapro.  #11 hypertension Continued on home regimen of Norvasc.    Procedures:  CT abdomen and pelvis 08/24/2016  Plain films of the left hip and pelvis 08/21/2016  Renal US 08/22/2016, 08/23/2016  Consultations:  Urology: Dr.Dahlstedt 08/22/2016  Discharge Exam: Vitals:   08/25/16 2226 08/26/16 0457  BP: 113/63 (!) 143/70  Pulse: 72 78  Resp: 16 16  Temp: 99.1 F (37.3 C) 97.8 F (36.6 C)    General: NAD Cardiovascular: RRR Respiratory: CTAB  Discharge Instructions   Discharge Instructions    Diet - low sodium heart healthy    Complete by:  As directed    Increase activity slowly    Complete by:  As directed      Current Discharge Medication List    START taking these medications   Details  enoxaparin (LOVENOX) 30 MG/0.3ML injection Inject 0.3 mLs (30 mg total) into the skin daily. Qty: 0 Syringe      CONTINUE these medications which have CHANGED   Details  LORazepam (ATIVAN) 0.5 MG tablet  Take 1 tablet (0.5 mg total) by mouth every 8 (eight) hours as needed for anxiety. Qty: 10 tablet, Refills: 0    QUEtiapine (SEROQUEL) 25 MG tablet Take 0.5 tablets (12.5 mg total) by mouth 2 (two) times daily. Qty: 30 tablet, Refills: 3    traMADol (ULTRAM) 50 MG tablet Take 1 tablet (50 mg total) by mouth every 8 (eight) hours. Qty: 10 tablet, Refills: 0      CONTINUE these medications which have NOT CHANGED   Details  acetaminophen (TYLENOL) 500 MG tablet Take 500 mg by mouth every 4 (four) hours as needed for mild pain.    alum & mag hydroxide-simeth (MINTOX) [id number] MG/5ML suspension Take 30 mLs by mouth every 6 (six) hours as needed for indigestion or heartburn.    Amino Acids-Protein Hydrolys (FEEDING SUPPLEMENT, PRO-STAT SUGAR FREE 64,) LIQD Take 30 mLs by mouth 2 (two) times daily.    amLODipine (NORVASC) 5 MG tablet Take 1 tablet (5 mg total) by mouth daily.    divalproex (DEPAKOTE SPRINKLE) 125 MG capsule Take 250 mg by mouth 3 (three) times daily.     ENSURE PLUS (ENSURE PLUS) LIQD Take 118.5 mLs by mouth 3 (three) times daily.    escitalopram (LEXAPRO) 10 MG tablet Take 10 mg by mouth daily.    ferrous sulfate 325 (65 FE) MG tablet Take 1 tablet (325 mg total) by mouth 3 (three) times daily after meals.  folic acid (FOLVITE) 1 MG tablet Take 1 tablet (1 mg total) by mouth daily.    guaifenesin (ROBITUSSIN) 100 MG/5ML syrup Take 200 mg by mouth 3 (three) times daily as needed for cough.    lanolin ointment Apply 1 application topically as needed for dry skin.    magnesium hydroxide (MILK OF MAGNESIA) 400 MG/5ML suspension Take 30 mLs by mouth daily as needed for mild constipation.    Memantine HCl-Donepezil HCl (NAMZARIC) 28-10 MG CP24 Take 1 capsule by mouth every morning.    Multiple Vitamin (MULTIVITAMIN WITH MINERALS) TABS tablet Take 1 tablet by mouth daily.    thiamine 100 MG tablet Take 1 tablet (100 mg total) by mouth daily.      STOP taking these  medications     torsemide (DEMADEX) 10 MG tablet      doxycycline (VIBRA-TABS) 100 MG tablet        Allergies  Allergen Reactions  . Codeine Phosphate Nausea Only   Follow-up Information    Jorja Loa, MD Follow up on 09/16/2016.   Specialty:  Urology Why:  Follow up app. @11am  Contact information: [place] [address] [phone]        MD AT SNF Follow up.            The results of significant diagnostics from this hospitalization (including imaging, microbiology, ancillary and laboratory) are listed below for reference.    Significant Diagnostic Studies: Ct Abdomen Pelvis Wo Contrast  Result Date: 08/24/2016 CLINICAL DATA:  70 y/o F; decreasing hemoglobin with concern for pelvic hematoma. EXAM: CT ABDOMEN AND PELVIS WITHOUT CONTRAST TECHNIQUE: Multidetector CT imaging of the abdomen and pelvis was performed following the standard protocol without IV contrast. COMPARISON:  08/23/2016 renal ultrasound. 08/03/2016 CT abdomen and pelvis. FINDINGS: Lower chest: Subsegmental basilar atelectasis. Trace bilateral pleural effusions. Decreased attenuation of cardiac blood pool indicates anemia. Mild coronary artery calcification. Hepatobiliary: No focal liver lesion. Gradient increased attenuation within the gallbladder may represent sludge or small stones. No intra or extrahepatic biliary ductal dilatation. Pancreas: Unremarkable. No pancreatic ductal dilatation or surrounding inflammatory changes. Spleen: Normal in size without focal abnormality. Adrenals/Urinary Tract: Mild bilateral hydronephrosis similar to prior renal sonogram. In the bladder is collapsed around a Foley catheter. No focal renal lesion. Stomach/Bowel: No obstructive or inflammatory changes of bowel. Large volume of stool in the rectal vault may represent constipation. Vascular/Lymphatic: Aortic atherosclerosis. No enlarged abdominal or pelvic lymph nodes. Reproductive: Stable left adnexal  pelvic mass measuring up to 4.6 cm. The right adnexa and uterus are unremarkable. Other: No abdominal wall hernia or abnormality. No abdominopelvic ascites. Musculoskeletal: Chronic overriding right femoral neck fracture without femoral head dislocation and acute left-sided intratrochanteric overriding proximal femur fracture without femoral head dislocation again seen. Multiple chronic fracture deformities of the superior and inferior pubic rami. Stable compression deformities of the T12 and L2 superior endplates. There is stranding of subcutaneous fat and mild swelling of the left-sided upper thigh muscles probably representing reactive edema. No discrete hematoma is identified. IMPRESSION: 1. No hematoma is identified. 2. Trace bilateral pleural effusions. 3. Gallbladder sludge or small stones. No evidence for cholecystitis. 4. Mild persistent hydronephrosis stable from prior renal ultrasound given differences in technique. 5. Chronic right and acute left proximal femoral fractures. Fat stranding and mild swelling of left upper thigh muscles is probably reactive edema. No discrete hematoma identified. 6. Stable T12 and L2 mild compression deformities. 7. **An incidental finding of potential clinical significance has  been found. Left adnexal mass measuring up to 4.6 cm, further characterization with ultrasound is recommended. This recommendation follows ACR consensus guidelines: White Paper of the ACR Incidental Findings Committee II on Adnexal Findings. J Am Coll Radiol [id number].** Electronically Signed   By: Kristine Garbe M.D.   On: 08/24/2016 22:34   Ct Abdomen Pelvis Wo Contrast  Result Date: 08/03/2016 CLINICAL DATA:  Abdominal pain.  Altered mental status. EXAM: CT ABDOMEN AND PELVIS WITHOUT CONTRAST TECHNIQUE: Multidetector CT imaging of the abdomen and pelvis was performed following the standard protocol without IV contrast. COMPARISON:  Lumbar spine radiographs 12/19/2012. FINDINGS:  Lower chest: Minimal atelectasis in the lung bases. Small calcified right hilar lymph nodes. Normal heart size. Hepatobiliary: The gallbladder is absent. No biliary dilatation is seen. No focal liver abnormality is identified within limitations of noncontrast technique and beam hardening from the patient's arms. Pancreas: Unremarkable. Spleen: Punctate calcified granuloma. Adrenals/Urinary Tract: Unremarkable adrenal glands. Moderate right greater than left hydronephrosis and moderate to severe right and moderate left diffuse hydroureter. No urinary tract calculi identified. Moderate right renal cortical thinning. Mild-to-moderate irregular wall thickening diffusely involving the bladder, which is mildly distended. Stomach/Bowel: The stomach is within normal limits. A small duodenal diverticulum is noted. There is no evidence of bowel obstruction or wall thickening. The appendix is absent. A moderate amount of stool is present in the rectal vault. Vascular/Lymphatic: Abdominal aortic atherosclerosis without aneurysm. No enlarged lymph nodes are identified. Reproductive: Unremarkable uterus and right ovary. A homogeneous lead mildly hyperattenuating left adnexal mass measures 4.6 x 3.9 cm. Other: No intraperitoneal free fluid. No abdominal wall mass or hernia. Musculoskeletal: Old left rib fractures. Chronic right femoral neck fracture with superior displacement. Chronic sacral fracture at the S2-3 level with anterior angulation of the distal sacrum and coccyx. Mild superior endplate compression fractures at T12 and L2 are new from 2014 but may be chronic. IMPRESSION: 1. Diffuse bladder wall thickening which may reflect cystitis. 2. Moderate to severe right and moderate left hydroureteronephrosis. No obstructing calculi. This may be secondary to #1. 3. Mild T12 and L2 compression fractures, new from 2014 though of indeterminate acuity. 4. **An incidental finding of potential clinical significance has been found. 4.6  cm left adnexal mass. Nonurgent evaluation with pelvic ultrasound is recommended.** 5. Aortic atherosclerosis. Electronically Signed   By: Logan Bores M.D.   On: 08/03/2016 18:52   Dg Chest 2 View  Result Date: 07/31/2016 CLINICAL DATA:  Altered mental status.  Seizure activity. EXAM: CHEST  2 VIEW COMPARISON:  12/23/2015 FINDINGS: Lateral view degraded by patient arm position. Artifact degraded lateral view posteriorly. Moderate right hemidiaphragm elevation. Chin overlies the apices. Patient rotated to the right on the frontal. Midline trachea. Mild cardiomegaly with a tortuous thoracic aorta. No pleural effusion or pneumothorax. Remote left rib trauma. Bibasilar scarring or atelectasis. No congestive failure. IMPRESSION: No acute cardiopulmonary disease. Cardiomegaly without congestive failure. Electronically Signed   By: Abigail Miyamoto M.D.   On: 07/31/2016 19:20   Ct Head Wo Contrast  Result Date: 07/31/2016 CLINICAL DATA:  Seizure activity. Altered mental status. History of dementia. EXAM: CT HEAD WITHOUT CONTRAST TECHNIQUE: Contiguous axial images were obtained from the base of the skull through the vertex without intravenous contrast. COMPARISON:  07/04/2016 FINDINGS: Brain: Cerebral atrophy for age. Moderate low density in the periventricular white matter likely related to small vessel disease. Cerebellar atrophy is also age advanced. No mass lesion, hemorrhage, hydrocephalus, acute infarct, intra-axial, or extra-axial fluid collection. Vascular: Bilateral  intracranial carotid and left vertebral artery atherosclerosis. Skull: Normal Sinuses/Orbits: Normal orbits and globes. Clear paranasal sinuses and mastoid air cells. Other: None. IMPRESSION: 1.  No acute intracranial abnormality. 2.  Cerebral atrophy and small vessel ischemic change. Electronically Signed   By: Abigail Miyamoto M.D.   On: 07/31/2016 19:23   US Renal  Result Date: 08/22/2016 CLINICAL DATA:  Patient with elevated creatinine. EXAM:  RENAL / URINARY TRACT ULTRASOUND COMPLETE COMPARISON:  CT abdomen pelvis 08/03/2016. FINDINGS: Right Kidney: Length: 13.4 cm. Mild renal cortical thinning increased renal cortical echogenicity. Moderate right hydronephrosis. Left Kidney: Length: 11.4 cm. Mild renal cortical thinning. Increased renal cortical echogenicity. Moderate hydronephrosis. Bladder: Circumferential wall thickening of the urinary bladder. IMPRESSION: Limited exam. Moderate bilateral hydronephrosis. Nonspecific circumferential wall thickening of the urinary bladder. Electronically Signed   By: Lovey Newcomer M.D.   On: 08/22/2016 12:28   US Renal Port  Result Date: 08/23/2016 CLINICAL DATA:  Follow-up on hydronephrosis after bladder drainage. EXAM: RENAL / URINARY TRACT ULTRASOUND COMPLETE COMPARISON:  08/22/2016 FINDINGS: Right Kidney: Length: 10 cm. Mild residual hydronephrosis with significant decompression since previous study. Normal renal parenchymal echotexture and thickness. Left Kidney: Length: 8.6 cm. Limited visualization of the left kidney due to poor penetration and overlying bowel and rib shadows. Interval resolution of hydronephrosis. No significant residual hydronephrosis. Parenchymal thickness appears intact. Bladder: Bladder is decompressed with a Foley catheter. IMPRESSION: Significant decrease in hydronephrosis since previous study with mild residual hydronephrosis on the right and no significant hydronephrosis on the left. Bladder is decompressed with a Foley catheter. Electronically Signed   By: Lucienne Capers M.D.   On: 08/23/2016 23:45   Dg Hip Unilat W Or W/o Pelvis Min 4 Views Left  Result Date: 08/21/2016 CLINICAL DATA:  LEFT hip pain, fall last weekend. EXAM: DG HIP (WITH OR WITHOUT PELVIS) 4+V LEFT COMPARISON:  CT abdomen and pelvis August 03, 2016 FINDINGS: Acute comminuted LEFT hip intertrochanteric fracture nonunited with impaction, mild varus angulation of the distal bony fragments. Osteopenia. Old  bilateral superior and inferior pubic rami fractures. Old RIGHT femoral head fracture with overriding bony fragments. IMPRESSION: Acute displaced LEFT femur intertrochanteric fracture without dislocation. Old nonunited RIGHT femoral neck fracture. Old bilateral pelvic fractures. Electronically Signed   By: Elon Alas M.D.   On: 08/21/2016 19:58    Microbiology: Recent Results (from the past 240 hour(s))  Urine culture     Status: Abnormal   Collection Time: 08/21/16  9:52 PM  Result Value Ref Range Status   Specimen Description URINE, CLEAN CATCH  Final   Special Requests NONE  Final   Culture 40,000 COLONIES/mL YEAST (A)  Final   Report Status 08/23/2016 FINAL  Final  MRSA PCR Screening     Status: None   Collection Time: 08/22/16  9:40 AM  Result Value Ref Range Status   MRSA by PCR NEGATIVE NEGATIVE Final    Comment:        The GeneXpert MRSA Assay (FDA approved for NASAL specimens only), is one component of a comprehensive MRSA colonization surveillance program. It is not intended to diagnose MRSA infection nor to guide or monitor treatment for MRSA infections.      Labs: Basic Metabolic Panel:  Recent Labs Lab 08/22/16 0618 08/23/16 0555 08/24/16 0540 08/25/16 0834 08/26/16 0610  NA 147* 150* 144 144 140  K 3.6 3.2* 4.0 3.8 3.6  CL 110 118* 116* 113* 111  CO2 27 26 25 25 23   GLUCOSE 101* 82 303*  93 111*  BUN 83* 64* 46* 33* 27*  CREATININE 2.43* 1.77* 1.43* 1.19* 1.08*  CALCIUM 8.3* 7.9* 7.8* 8.3* 8.3*   Liver Function Tests:  Recent Labs Lab 08/23/16 0555  AST 33  ALT 23  ALKPHOS 51  BILITOT 0.8  PROT 5.2*  ALBUMIN 1.9*   No results for input(s): LIPASE, AMYLASE in the last 168 hours. No results for input(s): AMMONIA in the last 168 hours. CBC:  Recent Labs Lab 08/21/16 1959 08/22/16 0618 08/23/16 0555 08/24/16 0540 08/25/16 0834 08/26/16 0610  WBC 16.0* 14.5* 7.5 6.4 7.8 10.0  NEUTROABS 12.4*  --   --   --  4.9  --   HGB 10.7*  10.4* 7.9* 7.7* 8.4* 8.9*  HCT 33.6* 32.7* 25.0* 24.7* 26.7* 28.0*  MCV 97.1 96.7 97.3 102.1* 100.4* 98.9  PLT 144* 151 115* 130* 159 177   Cardiac Enzymes: No results for input(s): CKTOTAL, CKMB, CKMBINDEX, TROPONINI in the last 168 hours. BNP: BNP (last 3 results) No results for input(s): BNP in the last 8760 hours.  ProBNP (last 3 results) No results for input(s): PROBNP in the last 8760 hours.  CBG: No results for input(s): GLUCAP in the last 168 hours.     SignedIrine Seal MD.  [facility] 08/26/2016, 2:52 PM

## 2016-08-26 NOTE — Progress Notes (Addendum)
[facility] called for transport.  

## 2016-08-26 NOTE — Progress Notes (Signed)
Discharge instructions accompanied Pt, left the unit in stable condition, transported via ambulance to SNF.

## 2016-08-26 NOTE — Progress Notes (Signed)
RN attempted to call report to [facility] without success. Will make another call.

## 2016-08-26 NOTE — Clinical Social Work Placement (Signed)
   CLINICAL SOCIAL WORK PLACEMENT  NOTE  Date:  08/26/2016  Patient Details  Name: Tara Shaffer MRN: [id number] Date of Birth: [birth year]  Clinical Social Work is seeking post-discharge placement for this patient at the [place] level of care (*CSW will initial, date and re-position this form in  chart as items are completed):  No   Patient/family provided with [facility] Work Department's list of facilities offering this level of care within the geographic area requested by the patient (or if unable, by the patient's family).  Yes   Patient/family informed of their freedom to choose among providers that offer the needed level of care, that participate in Medicare, Medicaid or managed care program needed by the patient, have an available bed and are willing to accept the patient.  No   Patient/family informed of [facility]'s ownership interest in [facility] and [facility], as well as of the fact that they are under no obligation to receive care at these facilities.  PASRR submitted to EDS on       PASRR number received on       Existing PASRR number confirmed on 08/26/16     FL2 transmitted to all facilities in geographic area requested by pt/family on       FL2 transmitted to all facilities within larger geographic area on       Patient informed that his/her managed care company has contracts with or will negotiate with certain facilities, including the following:        Yes   Patient/family informed of bed offers received.  Patient chooses bed at   [facility]    Physician recommends and patient chooses bed at    [facility]  Patient to be transferred to   on 08/26/16.  Patient to be transferred to facility by [facility]     Patient family notified on 08/26/16 of transfer.  Name of family member notified:  Miasia Headman   [phone]  PHYSICIAN Please prepare priority discharge summary,  including medications     Additional Comment: Patient is returning to SNF.    _______________________________________________ Lia Hopping, LCSW 08/26/2016, 2:06 PM

## 2016-08-27 ENCOUNTER — Encounter: Payer: Self-pay | Admitting: Adult Health

## 2016-08-27 ENCOUNTER — Other Ambulatory Visit: Payer: Self-pay | Admitting: *Deleted

## 2016-08-27 ENCOUNTER — Non-Acute Institutional Stay (SKILLED_NURSING_FACILITY): Payer: Medicare Other | Admitting: Adult Health

## 2016-08-27 DIAGNOSIS — F0391 Unspecified dementia with behavioral disturbance: Secondary | ICD-10-CM | POA: Diagnosis not present

## 2016-08-27 DIAGNOSIS — N182 Chronic kidney disease, stage 2 (mild): Secondary | ICD-10-CM | POA: Diagnosis not present

## 2016-08-27 DIAGNOSIS — I10 Essential (primary) hypertension: Secondary | ICD-10-CM | POA: Diagnosis not present

## 2016-08-27 DIAGNOSIS — E43 Unspecified severe protein-calorie malnutrition: Secondary | ICD-10-CM

## 2016-08-27 DIAGNOSIS — R339 Retention of urine, unspecified: Secondary | ICD-10-CM | POA: Diagnosis not present

## 2016-08-27 DIAGNOSIS — S72142G Displaced intertrochanteric fracture of left femur, subsequent encounter for closed fracture with delayed healing: Secondary | ICD-10-CM | POA: Diagnosis not present

## 2016-08-27 DIAGNOSIS — D638 Anemia in other chronic diseases classified elsewhere: Secondary | ICD-10-CM

## 2016-08-27 DIAGNOSIS — F39 Unspecified mood [affective] disorder: Secondary | ICD-10-CM | POA: Diagnosis not present

## 2016-08-27 DIAGNOSIS — N1330 Unspecified hydronephrosis: Secondary | ICD-10-CM | POA: Diagnosis not present

## 2016-08-27 DIAGNOSIS — F411 Generalized anxiety disorder: Secondary | ICD-10-CM

## 2016-08-27 DIAGNOSIS — S72142A Displaced intertrochanteric fracture of left femur, initial encounter for closed fracture: Secondary | ICD-10-CM | POA: Insufficient documentation

## 2016-08-27 MED ORDER — LORAZEPAM 0.5 MG PO TABS: 0.5000 mg | ORAL_TABLET | Freq: Three times a day (TID) | ORAL | 0 refills | Status: DC | PRN

## 2016-08-27 MED ORDER — TRAMADOL HCL 50 MG PO TABS: ORAL_TABLET | ORAL | 0 refills | Status: DC

## 2016-08-27 NOTE — Telephone Encounter (Signed)
[facility] #[phone] Fax:[phone]  

## 2016-08-27 NOTE — Progress Notes (Signed)
Patient ID: Tara Shaffer, female   DOB: Mar 24, 1946, 70 y.o.   MRN: [id number]    Location:    [facility] Room Number: 124-B Place of Service:  SNF (31)   CODE STATUS: Full Code until otherwise determined  Allergies  Allergen Reactions  . Codeine Phosphate Nausea Only    Chief Complaint  Patient presents with  . Hospitalization Follow-up    Hospital Follow up    HPI:  She has been hospitalized for a left hip fracture acute on chronic renal failure; bilateral hydronephrosis; uti. She is here for short term rehab. More than likely this does represent a long term placement for her. She is unable to participate in the hpi or ros.  There are no nursing concerns a this time.   Past Medical History:  Diagnosis Date  . Alcohol abuse 02/27/2012  . ALLERGIC RHINITIS 06/23/2007  . ANXIETY 06/23/2007  . ANXIETY DEPRESSION 06/12/2009  . Cancer ([facility])    skin cx/ hx melanoma  . COLONIC POLYPS, HX OF 03/08/2002  . 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 Dementia   . DIVERTICULOSIS, COLON 08/04/2007  . GERD 06/23/2007  . HYPERLIPIDEMIA 06/24/2007  . HYPERTENSION 06/23/2007  . Irritable bowel syndrome 06/23/2007  . LOW BACK PAIN 06/24/2007  . MELANOMA, HX OF 08/04/2007  . Memory dysfunction 06/23/2011    Past Surgical History:  Procedure Laterality Date  . APPENDECTOMY    . CESAREAN SECTION     x 2  . COLONOSCOPY      Social History   Social History  . Marital status: Married    Spouse name: [name]  . Number of children: [name]  . Years of education: [name]   Occupational History  . Not on file.   Social History Main Topics  . Smoking status: Never Smoker  . Smokeless tobacco: Never Used  . Alcohol use 25.2 oz/week    42 Glasses of wine per week  . Drug use: No  . Sexual activity: Yes    Birth control/ protection: Post-menopausal   Other Topics Concern  . Not on file   Social History Narrative  . No narrative on file   Family History  Problem  Relation Age of Onset  . Colon cancer Cousin   . Hypertension Other   . Diabetes Other       VITAL SIGNS BP 122/63   Pulse 70   Temp 98.2 F (36.8 C) (Oral)   Resp 18   Ht 5\' 5"  (1.651 m)   Wt 103 lb 8 oz (46.9 kg)   SpO2 98%   BMI 17.22 kg/m   Patient's Medications  New Prescriptions   No medications on file  Previous Medications   ACETAMINOPHEN (TYLENOL) 500 MG TABLET    Take 500 mg by mouth every 4 (four) hours as needed for mild pain.   ALUM & MAG HYDROXIDE-SIMETH (MINTOX) [id number] MG/5ML SUSPENSION    Take 30 mLs by mouth every 6 (six) hours as needed for indigestion or heartburn.   AMINO ACIDS-PROTEIN HYDROLYS (FEEDING SUPPLEMENT, PRO-STAT SUGAR FREE 64,) LIQD    Take 30 mLs by mouth 2 (two) times daily.   AMLODIPINE (NORVASC) 5 MG TABLET    Take 1 tablet (5 mg total) by mouth daily.   DIVALPROEX (DEPAKOTE SPRINKLE) 125 MG CAPSULE    Take 250 mg by mouth 3 (three) times daily.    ENOXAPARIN (LOVENOX) 30 MG/0.3ML INJECTION    Inject 0.3 mLs (30 mg total) into the  skin daily.   ENSURE PLUS (ENSURE PLUS) LIQD    Take 118.5 mLs by mouth 3 (three) times daily.   ESCITALOPRAM (LEXAPRO) 10 MG TABLET    Take 10 mg by mouth daily.   FERROUS SULFATE 325 (65 FE) MG TABLET    Take 1 tablet (325 mg total) by mouth 3 (three) times daily after meals.   FOLIC ACID (FOLVITE) 1 MG TABLET    Take 1 tablet (1 mg total) by mouth daily.   GUAIFENESIN (ROBITUSSIN) 100 MG/5ML SYRUP    Take 200 mg by mouth 3 (three) times daily as needed for cough.   LANOLIN OINTMENT    Apply 1 application topically as needed for dry skin.   LORAZEPAM (ATIVAN) 0.5 MG TABLET    Take 1 tablet (0.5 mg total) by mouth every 8 (eight) hours as needed for anxiety.   MAGNESIUM HYDROXIDE (MILK OF MAGNESIA) 400 MG/5ML SUSPENSION    Take 30 mLs by mouth daily as needed for mild constipation.   MEMANTINE HCL-DONEPEZIL HCL (NAMZARIC) 28-10 MG CP24    Take 1 capsule by mouth every morning.   MULTIPLE VITAMIN (MULTIVITAMIN  WITH MINERALS) TABS TABLET    Take 1 tablet by mouth daily.   QUETIAPINE (SEROQUEL) 25 MG TABLET    Take 0.5 tablets (12.5 mg total) by mouth 2 (two) times daily.   THIAMINE 100 MG TABLET    Take 1 tablet (100 mg total) by mouth daily.   TRAMADOL (ULTRAM) 50 MG TABLET    Take 1 tablet (50 mg total) by mouth every 8 (eight) hours.  Modified Medications   No medications on file  Discontinued Medications   No medications on file     SIGNIFICANT DIAGNOSTIC EXAMS  07-31-16: 1.  No acute intracranial abnormality. 2.  Cerebral atrophy and small vessel ischemic change  08-03-16: ct of abdomen and pelvis: 1. Diffuse bladder wall thickening which may reflect cystitis. 2. Moderate to severe right and moderate left hydroureteronephrosis. No obstructing calculi. This may be secondary to #1. 3. Mild T12 and L2 compression fractures, new from 2014 though of indeterminate acuity. 4. **An incidental finding of potential clinical significance has been found. 4.6 cm left adnexal mass. Nonurgent evaluation with pelvic ultrasound is recommended.** 5. Aortic atherosclerosis.  08-21-16: left hip x-ray: Acute displaced LEFT femur intertrochanteric fracture without dislocation. Old nonunited RIGHT femoral neck fracture. Old bilateral pelvic Fractures.  08-22-16: renal ultrasound: Limited exam. Moderate bilateral hydronephrosis. Nonspecific circumferential wall thickening of the urinary bladder.  08-23-16: renal ultrasound: Significant decrease in hydronephrosis since previous study with mild residual hydronephrosis on the right and no significant hydronephrosis on the left. Bladder is decompressed with a Foley catheter  08-24-16: ct of abdomen and pelvis: IMPRESSION: 1. No hematoma is identified. 2. Trace bilateral pleural effusions. 3. Gallbladder sludge or small stones. No evidence for cholecystitis. 4. Mild persistent hydronephrosis stable from prior renal ultrasound given differences in technique. 5.  Chronic right and acute left proximal femoral fractures. Fat stranding and mild swelling of left upper thigh muscles is probably reactive edema. No discrete hematoma identified. 6. Stable T12 and L2 mild compression deformities. 7. **An incidental finding of potential clinical significance has been found. Left adnexal mass measuring up to 4.6 cm, further characterization with ultrasound is recommended. T  LABS REVIEWED:   08-10-16: wbc 6.1; hgb 11.0; hct 34.0; mcv 97.1; plt 163; glucose 78; bun 21.9; creat 1.22; k+ 3.5; na++ 142  08-12-16: wbc 8.6; hgb 11.3; hct 34.9; mcv 97.7; plt  166; glucose 73; bun 30.5; creat 1.35; k+ 3.4 na++ 143;  08-21-16; wbc 16.0; hgb 10.7; hct 33.6; mcv 97.1; plt 144; glucose 125; bun 90; bun 2.96; k+ 3.4; na++ 147; urine culture: yeast: diflucan  08-24-16: wbc 6.4; hgb 7.7; hct 24.7;mcv 102.1; plt 130; glucose 303; bun 46; creat 1.43; k+ 4.0; na++ 144; vit B 12: 458; folate 33.6; iron 49; TIBC 140; ferritin 450  08-26-16: wbc 10.0; hgb 8.9; hct 28.0; mcv 98.9; plt 177; glucose 111; bun 27; creat 1.08; k+ 3.6; na++ 140    Review of Systems  Unable to perform ROS: Dementia    Physical Exam  Constitutional: No distress.  Frail   Eyes: Conjunctivae are normal.  Neck: Neck supple. No JVD present. No thyromegaly present.  Cardiovascular: Normal rate, regular rhythm and intact distal pulses.   Respiratory: Effort normal and breath sounds normal. No respiratory distress. She has no wheezes.  GI: Soft. Bowel sounds are normal. She exhibits no distension. There is no tenderness.  Musculoskeletal: She exhibits no edema.  Did not voluntarily more lower extremities   Lymphadenopathy:    She has no cervical adenopathy.  Neurological: She is alert.  Skin: Skin is warm and dry. She is not diaphoretic.  Psychiatric: She has a normal mood and affect.     ASSESSMENT/ PLAN:  1. Left  femur intertrochanteric fracture: she is being treated conservatively. She will follow  up with orthopedics as indicated and will be seen by therapy as directed. Will continue ultram 50 mg every 8 hours  lovenox 30 mg daily will monitor   2.  CKD stage II bun/creat 27/1.08 will monitor  3. Urine retention: has bilateral hydronephrosis: with possible high pressure bladder requires foley: will follow up with urology will monitor  4. Anemia: hgb 8.9; will continue iron three times daily   5. Hypertension: will continue norvasc 5 mg daily  6. Dementia: is related to her alcohol abuse  will continue namzaric 28-10 mg daily take thiamine [id number] mg daily and folic acid 1 mg daily  7. Anxiety with depression:  Has mood disorder: will continue lexapro 10 mg daily depakote 250 mg three times daily seroquel 12.5 mg nightly and has ativan 0.5 mg every 8 hours as needed  8. Severe protein calorie malnutrition: will continue supplements per facility protocol and will continue prostat 30 cc twice daily  Her weight is 103 pounds.  Albumin 1.9 (08-23-16)     MD is aware of resident's narcotic use and is in agreement with current plan of care. We will attempt to wean resident as apropriate   Ok Edwards NP [facility]  Contact [phone] Monday through Friday 8am- 5pm  After hours call [phone]

## 2016-08-28 ENCOUNTER — Telehealth: Payer: Self-pay

## 2016-08-28 NOTE — Telephone Encounter (Signed)
Pt has been dc'ed to SNF.

## 2016-08-30 ENCOUNTER — Non-Acute Institutional Stay (SKILLED_NURSING_FACILITY): Payer: Medicare Other | Admitting: Internal Medicine

## 2016-08-30 ENCOUNTER — Encounter: Payer: Self-pay | Admitting: Internal Medicine

## 2016-08-30 DIAGNOSIS — R339 Retention of urine, unspecified: Secondary | ICD-10-CM

## 2016-08-30 DIAGNOSIS — I10 Essential (primary) hypertension: Secondary | ICD-10-CM | POA: Diagnosis not present

## 2016-08-30 DIAGNOSIS — E43 Unspecified severe protein-calorie malnutrition: Secondary | ICD-10-CM

## 2016-08-30 DIAGNOSIS — F0391 Unspecified dementia with behavioral disturbance: Secondary | ICD-10-CM | POA: Diagnosis not present

## 2016-08-30 DIAGNOSIS — D638 Anemia in other chronic diseases classified elsewhere: Secondary | ICD-10-CM

## 2016-08-30 DIAGNOSIS — N1330 Unspecified hydronephrosis: Secondary | ICD-10-CM

## 2016-08-30 DIAGNOSIS — F411 Generalized anxiety disorder: Secondary | ICD-10-CM | POA: Diagnosis not present

## 2016-08-30 DIAGNOSIS — S72142G Displaced intertrochanteric fracture of left femur, subsequent encounter for closed fracture with delayed healing: Secondary | ICD-10-CM | POA: Diagnosis not present

## 2016-08-30 DIAGNOSIS — F1010 Alcohol abuse, uncomplicated: Secondary | ICD-10-CM

## 2016-08-30 DIAGNOSIS — R296 Repeated falls: Secondary | ICD-10-CM | POA: Diagnosis not present

## 2016-08-30 NOTE — Progress Notes (Signed)
Patient ID: FELICITE ZEIMET, female   DOB: Dec 24, 1945, 70 y.o.   MRN: [id number]    HISTORY AND PHYSICAL   DATE: 08/30/2016  Location:    [place] Room Number: [place] of Service: SNF (31)   Extended Emergency Contact Information Primary Emergency Contact: [facility] Address: [address]           [place], [place] [address] Johnnette Litter of [place] Phone: [phone] Work Phone: [phone] Mobile Phone: [phone] Relation: Son Secondary Emergency Contact: Benedict,Richard Address: [address]          [place] Johnnette Litter of [place] Phone: [phone] Relation: Son  Advanced Directive information Does Patient Have a Medical Advance Directive?: No  Chief Complaint  Patient presents with  . Readmit To SNF    HPI:  70 yo female long term resident seen today as a readmission into SNF following hospital stay for AKI/CKD, b/l hydronephrosis, HTN, left hip fx, UTI, severe protein calorie malnutrition, dementia,anemia.UA (+) and urine cx grew yeast.she was tx with full course of diflucan. Urology consulted and thought pt would require long term foley cath due to c/a high pressure bladder and inadequate upper tract drainage. Xray in ED revealed new displaced intertrochanteric left femur fx without dislocation. Ortho consulted and injuries determined to be nonoperable and conservative tx recommended. seroquel dose changed. FOBT neg. Hgb 10.7-->7.7-->8.4; WBC 16K-->10K; abs Neutrophils 12.4K; Na 147-->140; K as low as 3.2-->3.6; Cr 2.96-->1.08; albumin 1.9 at d/c. She presents to SNF for long term care.  Today she reports no concerns.nofalls. No nursing issues. appetite ok. She has difficulty voiding and now has foley cath long term. She is a poor historian due to memory loss. Hx obtained from chart.  Left  femur intertrochanteric fracture -  being treated conservatively. Followed by Ortho and facility therapy. takes ultram 50 mg  every 8 hours and  lovenox 30 mg daily for DVT prophylaxis   CKD stage II - stable.Cr 1.08   Urine retention with  bilateral hydronephrosis- Urology following. she has possible high pressure bladder/neurogenic bladder that will require long term foley  Hx Anemia -  hgb 8.4. Takes iron three times daily   Hypertension - BP stable on norvasc 5 mg daily  Dementia- due to past alcohol abuse. 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  Her weight is 103 pounds.  Albumin 1.9   Past Medical History:  Diagnosis Date  . Alcohol abuse 02/27/2012  . ALLERGIC RHINITIS 06/23/2007  . ANXIETY 06/23/2007  . ANXIETY DEPRESSION 06/12/2009  . Cancer ([place])    skin cx/ hx melanoma  . COLONIC POLYPS, HX OF 03/08/2002  . 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 Dementia   . DIVERTICULOSIS, COLON 08/04/2007  . GERD 06/23/2007  . HYPERLIPIDEMIA 06/24/2007  . HYPERTENSION 06/23/2007  . Irritable bowel syndrome 06/23/2007  . LOW BACK PAIN 06/24/2007  . MELANOMA, HX OF 08/04/2007  . Memory dysfunction 06/23/2011    Past Surgical History:  Procedure Laterality Date  . APPENDECTOMY    . CESAREAN SECTION     x 2  . COLONOSCOPY      Patient Care Team: Biagio Borg,  MD as PCP - General  Social History   Social History  . Marital status: Married    Spouse name: [name]  . Number of children: [name]  . Years of education: [name]   Occupational History  . Not on file.   Social History Main Topics  . Smoking status: Never Smoker  . Smokeless tobacco: Never Used  . Alcohol use 25.2 oz/week    42 Glasses of wine per week  . Drug use: No  . Sexual activity: Yes    Birth control/ protection:  Post-menopausal   Other Topics Concern  . Not on file   Social History Narrative  . No narrative on file     reports that she has never smoked. She has never used smokeless tobacco. She reports that she drinks about 25.2 oz of alcohol per week . She reports that she does not use drugs.  Family History  Problem Relation Age of Onset  . Colon cancer Cousin   . Hypertension Other   . Diabetes Other    Family Status  Relation Status  . Mother Alive   COPD  . Father Deceased   died in accident  . Cousin Deceased at age 57  . Other     Immunization History  Administered Date(s) Administered  . Pneumococcal Conjugate-13 07/31/2014  . Td 09/28/1995, 08/20/2008  . Tdap 07/18/2014  . Zoster 01/16/2008    Allergies  Allergen Reactions  . Codeine Phosphate Nausea Only    Medications: Patient's Medications  New Prescriptions   No medications on file  Previous Medications   ACETAMINOPHEN (TYLENOL) 500 MG TABLET    Take 500 mg by mouth every 4 (four) hours as needed for mild pain.   ALUM & MAG HYDROXIDE-SIMETH (MINTOX) 109-604-54 MG/5ML SUSPENSION    Take 30 mLs by mouth every 6 (six) hours as needed for indigestion or heartburn.   AMINO ACIDS-PROTEIN HYDROLYS (FEEDING SUPPLEMENT, PRO-STAT SUGAR FREE 64,) LIQD    Take 30 mLs by mouth 2 (two) times daily.   AMLODIPINE (NORVASC) 5 MG TABLET    Take 1 tablet (5 mg total) by mouth daily.   DIVALPROEX (DEPAKOTE SPRINKLE) 125 MG CAPSULE    Take 250 mg by mouth 3 (three) times daily.    ENOXAPARIN (LOVENOX) 30 MG/0.3ML INJECTION    Inject 0.3 mLs (30 mg total) into the skin daily.   ENSURE PLUS (ENSURE PLUS) LIQD    Take 118.5 mLs by mouth 3 (three) times daily.   FERROUS SULFATE 325 (65 FE) MG TABLET    Take 1 tablet (325 mg total) by mouth 3 (three) times daily after meals.   FOLIC ACID (FOLVITE) 1 MG TABLET    Take 1 tablet (1 mg total) by mouth daily.   GUAIFENESIN (ROBITUSSIN) 100 MG/5ML SYRUP    Take 200 mg by mouth 3 (three)  times daily as needed for cough.   LANOLIN OINTMENT    Apply 1 application topically as needed for dry skin.   LORAZEPAM (ATIVAN) 0.5 MG TABLET    Take 1 tablet (0.5 mg total) by mouth every 8 (eight) hours as needed for anxiety.   MAGNESIUM HYDROXIDE (MILK OF MAGNESIA) 400 MG/5ML SUSPENSION    Take 30 mLs by mouth daily as needed for mild constipation.   MEMANTINE HCL-DONEPEZIL HCL (NAMZARIC) 28-10 MG CP24    Take 1 capsule by mouth every morning.   MULTIPLE VITAMIN (MULTIVITAMIN WITH MINERALS) TABS TABLET    Take 1 tablet by mouth daily.  QUETIAPINE (SEROQUEL) 25 MG TABLET    Take 0.5 tablets (12.5 mg total) by mouth 2 (two) times daily.   THIAMINE 100 MG TABLET    Take 1 tablet (100 mg total) by mouth daily.   TRAMADOL (ULTRAM) 50 MG TABLET    Take one tablet by mouth every 8 hours for pain  Modified Medications   No medications on file  Discontinued Medications   ESCITALOPRAM (LEXAPRO) 10 MG TABLET    Take 10 mg by mouth daily.    Review of Systems  Unable to perform ROS: Other (memory loss)    Vitals:   08/30/16 0932  BP: 95/62  Pulse: 98  Resp: 18  Temp: 97.6 F (36.4 C)  TempSrc: Oral  SpO2: 98%  Weight: 103 lb 12.8 oz (47.1 kg)  Height: '5\' 5"'$  (1.651 m)   Body mass index is 17.27 kg/m.  Physical Exam  Constitutional: She appears well-developed.  Frail appearing in NAD, sitting up in bed  HENT:  Mouth/Throat: Oropharynx is clear and moist. No oropharyngeal exudate.  No oral thrush  Eyes: Pupils are equal, round, and reactive to light. No scleral icterus.  Neck: Neck supple. Carotid bruit is not present. No tracheal deviation present. No thyromegaly present.  Cardiovascular: Normal rate, regular rhythm and intact distal pulses.  Exam reveals no gallop and no friction rub.   Murmur (1/6 SEM) heard. No LE edema b/l. no calf TTP.   Pulmonary/Chest: Effort normal and breath sounds normal. No stridor. No respiratory distress. She has no wheezes. She has no rales.    Abdominal: Soft. Bowel sounds are normal. She exhibits no distension and no mass. There is no hepatomegaly. There is no tenderness. There is no rebound and no guarding.  Genitourinary:  Genitourinary Comments: Foley DTG clear yellow urine  Musculoskeletal: She exhibits edema.  Lymphadenopathy:    She has no cervical adenopathy.  Neurological: She is alert.  Skin: Skin is warm and dry. No rash noted.  prevalon boots intact b/l  Psychiatric: She has a normal mood and affect. Her behavior is normal.     Labs reviewed: Nursing Home on 08/30/2016  Component Date Value Ref Range Status  . Hemoglobin 08/12/2016 11.3* 12.0 - 16.0 g/dL Final  . HCT 08/12/2016 35* 36 - 46 % Final  . Platelets 08/12/2016 166  150 - 399 K/L Final  . WBC 08/12/2016 8.6  10^3/mL Final  . Glucose 08/12/2016 73  mg/dL Final  . BUN 08/12/2016 31* 4 - 21 mg/dL Final  . Creatinine 08/12/2016 1.4* 0.5 - 1.1 mg/dL Final  . Potassium 08/12/2016 3.4  3.4 - 5.3 mmol/L Final  . Sodium 08/12/2016 143  137 - 147 mmol/L Final  . Hemoglobin 08/10/2016 11.0* 12.0 - 16.0 g/dL Final  . HCT 08/10/2016 34* 36 - 46 % Final  . Platelets 08/10/2016 163  150 - 399 K/L Final  . WBC 08/10/2016 6.1  10^3/mL Final  . Glucose 08/10/2016 78  mg/dL Final  . BUN 08/10/2016 22* 4 - 21 mg/dL Final  . Creatinine 08/10/2016 1.2* 0.5 - 1.1 mg/dL Final  . Potassium 08/10/2016 3.5  3.4 - 5.3 mmol/L Final  . Sodium 08/10/2016 142  137 - 147 mmol/L Final  Admission on 08/21/2016, Discharged on 08/26/2016  Component Date Value Ref Range Status  . WBC 08/21/2016 16.0* 4.0 - 10.5 K/uL Final  . RBC 08/21/2016 3.46* 3.87 - 5.11 MIL/uL Final  . Hemoglobin 08/21/2016 10.7* 12.0 - 15.0 g/dL Final  . HCT 08/21/2016 33.6*  36.0 - 46.0 % Final  . MCV 08/21/2016 97.1  78.0 - 100.0 fL Final  . MCH 08/21/2016 30.9  26.0 - 34.0 pg Final  . MCHC 08/21/2016 31.8  30.0 - 36.0 g/dL Final  . RDW 08/21/2016 14.7  11.5 - 15.5 % Final  . Platelets 08/21/2016 144*  150 - 400 K/uL Final  . Neutrophils Relative % 08/21/2016 78  % Final  . Neutro Abs 08/21/2016 12.4* 1.7 - 7.7 K/uL Final  . Lymphocytes Relative 08/21/2016 10  % Final  . Lymphs Abs 08/21/2016 1.7  0.7 - 4.0 K/uL Final  . Monocytes Relative 08/21/2016 12  % Final  . Monocytes Absolute 08/21/2016 2.0* 0.1 - 1.0 K/uL Final  . Eosinophils Relative 08/21/2016 0  % Final  . Eosinophils Absolute 08/21/2016 0.0  0.0 - 0.7 K/uL Final  . Basophils Relative 08/21/2016 0  % Final  . Basophils Absolute 08/21/2016 0.0  0.0 - 0.1 K/uL Final  . Sodium 08/21/2016 147* 135 - 145 mmol/L Final  . Potassium 08/21/2016 3.4* 3.5 - 5.1 mmol/L Final  . Chloride 08/21/2016 106  101 - 111 mmol/L Final  . CO2 08/21/2016 30  22 - 32 mmol/L Final  . Glucose, Bld 08/21/2016 125* 65 - 99 mg/dL Final  . BUN 08/21/2016 90* 6 - 20 mg/dL Final  . Creatinine, Ser 08/21/2016 2.96* 0.44 - 1.00 mg/dL Final  . Calcium 08/21/2016 8.8* 8.9 - 10.3 mg/dL Final  . GFR calc non Af Amer 08/21/2016 15* >60 mL/min Final  . GFR calc Af Amer 08/21/2016 18* >60 mL/min Final   Comment: (NOTE) The eGFR has been calculated using the CKD EPI equation. This calculation has not been validated in all clinical situations. eGFR's persistently <60 mL/min signify possible Chronic Kidney Disease.   . Anion gap 08/21/2016 11  5 - 15 Final  . Color, Urine 08/21/2016 YELLOW  YELLOW Final  . APPearance 08/21/2016 TURBID* CLEAR Final  . Specific Gravity, Urine 08/21/2016 1.018  1.005 - 1.030 Final  . pH 08/21/2016 5.5  5.0 - 8.0 Final  . Glucose, UA 08/21/2016 NEGATIVE  NEGATIVE mg/dL Final  . Hgb urine dipstick 08/21/2016 SMALL* NEGATIVE Final  . Bilirubin Urine 08/21/2016 NEGATIVE  NEGATIVE Final  . Ketones, ur 08/21/2016 NEGATIVE  NEGATIVE mg/dL Final  . Protein, ur 08/21/2016 NEGATIVE  NEGATIVE mg/dL Final  . Nitrite 08/21/2016 NEGATIVE  NEGATIVE Final  . Leukocytes, UA 08/21/2016 LARGE* NEGATIVE Final  . Squamous Epithelial / LPF  08/21/2016 0-5* NONE SEEN Final  . WBC, UA 08/21/2016 TOO NUMEROUS TO COUNT  0 - 5 WBC/hpf Final  . RBC / HPF 08/21/2016 0-5  0 - 5 RBC/hpf Final  . Bacteria, UA 08/21/2016 FEW* NONE SEEN Final  . Urine-Other 08/21/2016 YEAST PRESENT   Final  . Specimen Description 08/23/2016 URINE, CLEAN CATCH   Final  . Special Requests 08/23/2016 NONE   Final  . Culture 08/23/2016 40,000 COLONIES/mL YEAST*  Final  . Report Status 08/23/2016 08/23/2016 FINAL   Final  . WBC 08/22/2016 14.5* 4.0 - 10.5 K/uL Final  . RBC 08/22/2016 3.38* 3.87 - 5.11 MIL/uL Final  . Hemoglobin 08/22/2016 10.4* 12.0 - 15.0 g/dL Final  . HCT 08/22/2016 32.7* 36.0 - 46.0 % Final  . MCV 08/22/2016 96.7  78.0 - 100.0 fL Final  . MCH 08/22/2016 30.8  26.0 - 34.0 pg Final  . MCHC 08/22/2016 31.8  30.0 - 36.0 g/dL Final  . RDW 08/22/2016 14.4  11.5 - 15.5 % Final  .  Platelets 08/22/2016 151  150 - 400 K/uL Final  . Sodium 08/22/2016 147* 135 - 145 mmol/L Final  . Potassium 08/22/2016 3.6  3.5 - 5.1 mmol/L Final  . Chloride 08/22/2016 110  101 - 111 mmol/L Final  . CO2 08/22/2016 27  22 - 32 mmol/L Final  . Glucose, Bld 08/22/2016 101* 65 - 99 mg/dL Final  . BUN 08/22/2016 83* 6 - 20 mg/dL Final  . Creatinine, Ser 08/22/2016 2.43* 0.44 - 1.00 mg/dL Final  . Calcium 08/22/2016 8.3* 8.9 - 10.3 mg/dL Final  . GFR calc non Af Amer 08/22/2016 19* >60 mL/min Final  . GFR calc Af Amer 08/22/2016 22* >60 mL/min Final   Comment: (NOTE) The eGFR has been calculated using the CKD EPI equation. This calculation has not been validated in all clinical situations. eGFR's persistently <60 mL/min signify possible Chronic Kidney Disease.   . Anion gap 08/22/2016 10  5 - 15 Final  . MRSA by PCR 08/22/2016 NEGATIVE  NEGATIVE Final   Comment:        The GeneXpert MRSA Assay (FDA approved for NASAL specimens only), is one component of a comprehensive MRSA colonization surveillance program. It is not intended to diagnose MRSA infection nor  to guide or monitor treatment for MRSA infections.   . WBC 08/23/2016 7.5  4.0 - 10.5 K/uL Final  . RBC 08/23/2016 2.57* 3.87 - 5.11 MIL/uL Final  . Hemoglobin 08/23/2016 7.9* 12.0 - 15.0 g/dL Final   Comment: REPEATED TO VERIFY DELTA CHECK NOTED   . HCT 08/23/2016 25.0* 36.0 - 46.0 % Final  . MCV 08/23/2016 97.3  78.0 - 100.0 fL Final  . MCH 08/23/2016 30.7  26.0 - 34.0 pg Final  . MCHC 08/23/2016 31.6  30.0 - 36.0 g/dL Final  . RDW 08/23/2016 14.5  11.5 - 15.5 % Final  . Platelets 08/23/2016 115* 150 - 400 K/uL Final   Comment: REPEATED TO VERIFY SPECIMEN CHECKED FOR CLOTS PLATELET COUNT CONFIRMED BY SMEAR   . Sodium 08/23/2016 150* 135 - 145 mmol/L Final  . Potassium 08/23/2016 3.2* 3.5 - 5.1 mmol/L Final  . Chloride 08/23/2016 118* 101 - 111 mmol/L Final  . CO2 08/23/2016 26  22 - 32 mmol/L Final  . Glucose, Bld 08/23/2016 82  65 - 99 mg/dL Final  . BUN 08/23/2016 64* 6 - 20 mg/dL Final  . Creatinine, Ser 08/23/2016 1.77* 0.44 - 1.00 mg/dL Final  . Calcium 08/23/2016 7.9* 8.9 - 10.3 mg/dL Final  . Total Protein 08/23/2016 5.2* 6.5 - 8.1 g/dL Final  . Albumin 08/23/2016 1.9* 3.5 - 5.0 g/dL Final  . AST 08/23/2016 33  15 - 41 U/L Final  . ALT 08/23/2016 23  14 - 54 U/L Final  . Alkaline Phosphatase 08/23/2016 51  38 - 126 U/L Final  . Total Bilirubin 08/23/2016 0.8  0.3 - 1.2 mg/dL Final  . GFR calc non Af Amer 08/23/2016 28* >60 mL/min Final  . GFR calc Af Amer 08/23/2016 33* >60 mL/min Final   Comment: (NOTE) The eGFR has been calculated using the CKD EPI equation. This calculation has not been validated in all clinical situations. eGFR's persistently <60 mL/min signify possible Chronic Kidney Disease.   . Anion gap 08/23/2016 6  5 - 15 Final  . WBC 08/24/2016 6.4  4.0 - 10.5 K/uL Final  . RBC 08/24/2016 2.42* 3.87 - 5.11 MIL/uL Final  . Hemoglobin 08/24/2016 7.7* 12.0 - 15.0 g/dL Final  . HCT 08/24/2016 24.7* 36.0 - 46.0 %  Final  . MCV 08/24/2016 102.1* 78.0 -  100.0 fL Final  . MCH 08/24/2016 31.8  26.0 - 34.0 pg Final  . MCHC 08/24/2016 31.2  30.0 - 36.0 g/dL Final  . RDW 08/24/2016 15.2  11.5 - 15.5 % Final  . Platelets 08/24/2016 130* 150 - 400 K/uL Final  . Sodium 08/24/2016 144  135 - 145 mmol/L Final  . Potassium 08/24/2016 4.0  3.5 - 5.1 mmol/L Final   Comment: DELTA CHECK NOTED REPEATED TO VERIFY NO VISIBLE HEMOLYSIS   . Chloride 08/24/2016 116* 101 - 111 mmol/L Final  . CO2 08/24/2016 25  22 - 32 mmol/L Final  . Glucose, Bld 08/24/2016 303* 65 - 99 mg/dL Final  . BUN 08/24/2016 46* 6 - 20 mg/dL Final  . Creatinine, Ser 08/24/2016 1.43* 0.44 - 1.00 mg/dL Final  . Calcium 08/24/2016 7.8* 8.9 - 10.3 mg/dL Final  . GFR calc non Af Amer 08/24/2016 36* >60 mL/min Final  . GFR calc Af Amer 08/24/2016 42* >60 mL/min Final   Comment: (NOTE) The eGFR has been calculated using the CKD EPI equation. This calculation has not been validated in all clinical situations. eGFR's persistently <60 mL/min signify possible Chronic Kidney Disease.   . Anion gap 08/24/2016 3* 5 - 15 Final  . Vitamin B-12 08/24/2016 458  180 - 914 pg/mL Final   Comment: (NOTE) This assay is not validated for testing neonatal or myeloproliferative syndrome specimens for Vitamin B12 levels. Performed at [facility]   . Folate 08/24/2016 33.6  >5.9 ng/mL Final  . Iron 08/24/2016 49  28 - 170 ug/dL Final  . TIBC 08/24/2016 140* 250 - 450 ug/dL Final  . Saturation Ratios 08/24/2016 35* 10.4 - 31.8 % Final  . UIBC 08/24/2016 91  ug/dL Final  . Ferritin 08/24/2016 450* 11 - 307 ng/mL Final  . Retic Ct Pct 08/24/2016 3.0  0.4 - 3.1 % Final  . RBC. 08/24/2016 2.42* 3.87 - 5.11 MIL/uL Final  . Retic Count, Manual 08/24/2016 72.6  19.0 - 186.0 K/uL Final  . Fecal Occult Bld 08/24/2016 NEGATIVE  NEGATIVE Final  . Sodium 08/25/2016 144  135 - 145 mmol/L Final  . Potassium 08/25/2016 3.8  3.5 - 5.1 mmol/L Final  . Chloride 08/25/2016 113* 101 - 111 mmol/L Final  .  CO2 08/25/2016 25  22 - 32 mmol/L Final  . Glucose, Bld 08/25/2016 93  65 - 99 mg/dL Final  . BUN 08/25/2016 33* 6 - 20 mg/dL Final  . Creatinine, Ser 08/25/2016 1.19* 0.44 - 1.00 mg/dL Final  . Calcium 08/25/2016 8.3* 8.9 - 10.3 mg/dL Final  . GFR calc non Af Amer 08/25/2016 45* >60 mL/min Final  . GFR calc Af Amer 08/25/2016 53* >60 mL/min Final   Comment: (NOTE) The eGFR has been calculated using the CKD EPI equation. This calculation has not been validated in all clinical situations. eGFR's persistently <60 mL/min signify possible Chronic Kidney Disease.   . Anion gap 08/25/2016 6  5 - 15 Final  . WBC 08/25/2016 7.8  4.0 - 10.5 K/uL Final  . RBC 08/25/2016 2.66* 3.87 - 5.11 MIL/uL Final  . Hemoglobin 08/25/2016 8.4* 12.0 - 15.0 g/dL Final  . HCT 08/25/2016 26.7* 36.0 - 46.0 % Final  . MCV 08/25/2016 100.4* 78.0 - 100.0 fL Final  . MCH 08/25/2016 31.6  26.0 - 34.0 pg Final  . MCHC 08/25/2016 31.5  30.0 - 36.0 g/dL Final  . RDW 08/25/2016 15.0  11.5 - 15.5 %  Final  . Platelets 08/25/2016 159  150 - 400 K/uL Final  . Neutrophils Relative % 08/25/2016 62  % Final  . Neutro Abs 08/25/2016 4.9  1.7 - 7.7 K/uL Final  . Lymphocytes Relative 08/25/2016 21  % Final  . Lymphs Abs 08/25/2016 1.6  0.7 - 4.0 K/uL Final  . Monocytes Relative 08/25/2016 13  % Final  . Monocytes Absolute 08/25/2016 1.0  0.1 - 1.0 K/uL Final  . Eosinophils Relative 08/25/2016 4  % Final  . Eosinophils Absolute 08/25/2016 0.3  0.0 - 0.7 K/uL Final  . Basophils Relative 08/25/2016 0  % Final  . Basophils Absolute 08/25/2016 0.0  0.0 - 0.1 K/uL Final  . WBC 08/26/2016 10.0  4.0 - 10.5 K/uL Final  . RBC 08/26/2016 2.83* 3.87 - 5.11 MIL/uL Final  . Hemoglobin 08/26/2016 8.9* 12.0 - 15.0 g/dL Final  . HCT 08/26/2016 28.0* 36.0 - 46.0 % Final  . MCV 08/26/2016 98.9  78.0 - 100.0 fL Final  . MCH 08/26/2016 31.4  26.0 - 34.0 pg Final  . MCHC 08/26/2016 31.8  30.0 - 36.0 g/dL Final  . RDW 08/26/2016 14.9  11.5 - 15.5  % Final  . Platelets 08/26/2016 177  150 - 400 K/uL Final  . Sodium 08/26/2016 140  135 - 145 mmol/L Final  . Potassium 08/26/2016 3.6  3.5 - 5.1 mmol/L Final  . Chloride 08/26/2016 111  101 - 111 mmol/L Final  . CO2 08/26/2016 23  22 - 32 mmol/L Final  . Glucose, Bld 08/26/2016 111* 65 - 99 mg/dL Final  . BUN 08/26/2016 27* 6 - 20 mg/dL Final  . Creatinine, Ser 08/26/2016 1.08* 0.44 - 1.00 mg/dL Final  . Calcium 08/26/2016 8.3* 8.9 - 10.3 mg/dL Final  . GFR calc non Af Amer 08/26/2016 51* >60 mL/min Final  . GFR calc Af Amer 08/26/2016 59* >60 mL/min Final   Comment: (NOTE) The eGFR has been calculated using the CKD EPI equation. This calculation has not been validated in all clinical situations. eGFR's persistently <60 mL/min signify possible Chronic Kidney Disease.   . Anion gap 08/26/2016 6  5 - 15 Final  Admission on 07/31/2016, Discharged on 08/05/2016  Component Date Value Ref Range Status  . WBC 07/31/2016 6.1  4.0 - 10.5 K/uL Final  . RBC 07/31/2016 3.72* 3.87 - 5.11 MIL/uL Final  . Hemoglobin 07/31/2016 11.3* 12.0 - 15.0 g/dL Final  . HCT 07/31/2016 35.4* 36.0 - 46.0 % Final  . MCV 07/31/2016 95.2  78.0 - 100.0 fL Final  . MCH 07/31/2016 30.4  26.0 - 34.0 pg Final  . MCHC 07/31/2016 31.9  30.0 - 36.0 g/dL Final  . RDW 07/31/2016 12.7  11.5 - 15.5 % Final  . Platelets 07/31/2016 202  150 - 400 K/uL Final  . Neutrophils Relative % 07/31/2016 52  % Final  . Neutro Abs 07/31/2016 3.2  1.7 - 7.7 K/uL Final  . Lymphocytes Relative 07/31/2016 33  % Final  . Lymphs Abs 07/31/2016 2.0  0.7 - 4.0 K/uL Final  . Monocytes Relative 07/31/2016 11  % Final  . Monocytes Absolute 07/31/2016 0.7  0.1 - 1.0 K/uL Final  . Eosinophils Relative 07/31/2016 3  % Final  . Eosinophils Absolute 07/31/2016 0.2  0.0 - 0.7 K/uL Final  . Basophils Relative 07/31/2016 1  % Final  . Basophils Absolute 07/31/2016 0.0  0.0 - 0.1 K/uL Final  . Sodium 07/31/2016 141  135 - 145 mmol/L Final  . Potassium  07/31/2016 3.2* 3.5 - 5.1 mmol/L Final  . Chloride 07/31/2016 99* 101 - 111 mmol/L Final  . CO2 07/31/2016 31  22 - 32 mmol/L Final  . Glucose, Bld 07/31/2016 86  65 - 99 mg/dL Final  . BUN 07/31/2016 23* 6 - 20 mg/dL Final  . Creatinine, Ser 07/31/2016 1.47* 0.44 - 1.00 mg/dL Final  . Calcium 07/31/2016 8.9  8.9 - 10.3 mg/dL Final  . Total Protein 07/31/2016 7.4  6.5 - 8.1 g/dL Final  . Albumin 07/31/2016 3.0* 3.5 - 5.0 g/dL Final  . AST 07/31/2016 20  15 - 41 U/L Final  . ALT 07/31/2016 12* 14 - 54 U/L Final  . Alkaline Phosphatase 07/31/2016 78  38 - 126 U/L Final  . Total Bilirubin 07/31/2016 0.4  0.3 - 1.2 mg/dL Final  . GFR calc non Af Amer 07/31/2016 35* >60 mL/min Final  . GFR calc Af Amer 07/31/2016 41* >60 mL/min Final   Comment: (NOTE) The eGFR has been calculated using the CKD EPI equation. This calculation has not been validated in all clinical situations. eGFR's persistently <60 mL/min signify possible Chronic Kidney Disease.   . Anion gap 07/31/2016 11  5 - 15 Final  . Color, Urine 07/31/2016 YELLOW  YELLOW Final  . APPearance 07/31/2016 TURBID* CLEAR Final  . Specific Gravity, Urine 07/31/2016 1.012  1.005 - 1.030 Final  . pH 07/31/2016 7.0  5.0 - 8.0 Final  . Glucose, UA 07/31/2016 NEGATIVE  NEGATIVE mg/dL Final  . Hgb urine dipstick 07/31/2016 MODERATE* NEGATIVE Final  . Bilirubin Urine 07/31/2016 NEGATIVE  NEGATIVE Final  . Ketones, ur 07/31/2016 NEGATIVE  NEGATIVE mg/dL Final  . Protein, ur 07/31/2016 30* NEGATIVE mg/dL Final  . Nitrite 07/31/2016 NEGATIVE  NEGATIVE Final  . Leukocytes, UA 07/31/2016 LARGE* NEGATIVE Final  . Troponin i, poc 07/31/2016 0.01  0.00 - 0.08 ng/mL Final  . Comment 3 07/31/2016          Final   Comment: Due to the release kinetics of cTnI, a negative result within the first hours of the onset of symptoms does not rule out myocardial infarction with certainty. If myocardial infarction is still suspected, repeat the test at  appropriate intervals.   . Lactic Acid, Venous 07/31/2016 0.64  0.5 - 1.9 mmol/L Final  . Squamous Epithelial / LPF 07/31/2016 0-5* NONE SEEN Final  . WBC, UA 07/31/2016 TOO NUMEROUS TO COUNT  0 - 5 WBC/hpf Final  . RBC / HPF 07/31/2016 0-5  0 - 5 RBC/hpf Final  . Bacteria, UA 07/31/2016 RARE* NONE SEEN Final  . Sodium 07/31/2016 143  135 - 145 mmol/L Final  . Potassium 07/31/2016 2.9* 3.5 - 5.1 mmol/L Final  . Chloride 07/31/2016 99* 101 - 111 mmol/L Final  . BUN 07/31/2016 23* 6 - 20 mg/dL Final  . Creatinine, Ser 07/31/2016 1.40* 0.44 - 1.00 mg/dL Final  . Glucose, Bld 07/31/2016 91  65 - 99 mg/dL Final  . Calcium, Ion 07/31/2016 1.10* 1.15 - 1.40 mmol/L Final  . TCO2 07/31/2016 33  0 - 100 mmol/L Final  . Hemoglobin 07/31/2016 10.9* 12.0 - 15.0 g/dL Final  . HCT 07/31/2016 32.0* 36.0 - 46.0 % Final  . Troponin i, poc 07/31/2016 0.00  0.00 - 0.08 ng/mL Final  . Comment 3 07/31/2016          Final   Comment: Due to the release kinetics of cTnI, a negative result within the first hours of the onset of symptoms does not rule  out myocardial infarction with certainty. If myocardial infarction is still suspected, repeat the test at appropriate intervals.   . Prothrombin Time 08/01/2016 13.9  11.4 - 15.2 seconds Final  . INR 08/01/2016 1.07   Final  . WBC 08/01/2016 5.4  4.0 - 10.5 K/uL Final  . RBC 08/01/2016 3.10* 3.87 - 5.11 MIL/uL Final  . Hemoglobin 08/01/2016 9.2* 12.0 - 15.0 g/dL Final  . HCT 08/01/2016 29.3* 36.0 - 46.0 % Final  . MCV 08/01/2016 94.5  78.0 - 100.0 fL Final  . MCH 08/01/2016 29.7  26.0 - 34.0 pg Final  . MCHC 08/01/2016 31.4  30.0 - 36.0 g/dL Final  . RDW 08/01/2016 12.7  11.5 - 15.5 % Final  . Platelets 08/01/2016 181  150 - 400 K/uL Final  . Sodium 08/01/2016 142  135 - 145 mmol/L Final  . Potassium 08/01/2016 3.7  3.5 - 5.1 mmol/L Final  . Chloride 08/01/2016 108  101 - 111 mmol/L Final  . CO2 08/01/2016 29  22 - 32 mmol/L Final  . Glucose, Bld  08/01/2016 75  65 - 99 mg/dL Final  . BUN 08/01/2016 17  6 - 20 mg/dL Final  . Creatinine, Ser 08/01/2016 1.31* 0.44 - 1.00 mg/dL Final  . Calcium 08/01/2016 8.1* 8.9 - 10.3 mg/dL Final  . GFR calc non Af Amer 08/01/2016 41* >60 mL/min Final  . GFR calc Af Amer 08/01/2016 47* >60 mL/min Final   Comment: (NOTE) The eGFR has been calculated using the CKD EPI equation. This calculation has not been validated in all clinical situations. eGFR's persistently <60 mL/min signify possible Chronic Kidney Disease.   . Anion gap 08/01/2016 5  5 - 15 Final  . MRSA by PCR 08/01/2016 NEGATIVE  NEGATIVE Final   Comment:        The GeneXpert MRSA Assay (FDA approved for NASAL specimens only), is one component of a comprehensive MRSA colonization surveillance program. It is not intended to diagnose MRSA infection nor to guide or monitor treatment for MRSA infections.   Marland Kitchen Specimen Description 08/03/2016 URINE, CATHETERIZED   Final  . Special Requests 08/03/2016 NONE   Final  . Culture 08/03/2016 60,000 COLONIES/mL METHICILLIN RESISTANT STAPHYLOCOCCUS AUREUS*  Final  . Report Status 08/03/2016 08/03/2016 FINAL   Final  . Organism ID, Bacteria 08/03/2016 METHICILLIN RESISTANT STAPHYLOCOCCUS AUREUS*  Final  . WBC 08/02/2016 5.6  4.0 - 10.5 K/uL Final  . RBC 08/02/2016 3.38* 3.87 - 5.11 MIL/uL Final  . Hemoglobin 08/02/2016 9.9* 12.0 - 15.0 g/dL Final  . HCT 08/02/2016 32.2* 36.0 - 46.0 % Final  . MCV 08/02/2016 95.3  78.0 - 100.0 fL Final  . MCH 08/02/2016 29.3  26.0 - 34.0 pg Final  . MCHC 08/02/2016 30.7  30.0 - 36.0 g/dL Final  . RDW 08/02/2016 12.8  11.5 - 15.5 % Final  . Platelets 08/02/2016 212  150 - 400 K/uL Final  . Sodium 08/02/2016 143  135 - 145 mmol/L Final  . Potassium 08/02/2016 3.9  3.5 - 5.1 mmol/L Final  . Chloride 08/02/2016 107  101 - 111 mmol/L Final  . CO2 08/02/2016 30  22 - 32 mmol/L Final  . Glucose, Bld 08/02/2016 71  65 - 99 mg/dL Final  . BUN 08/02/2016 15  6 - 20  mg/dL Final  . Creatinine, Ser 08/02/2016 1.46* 0.44 - 1.00 mg/dL Final  . Calcium 08/02/2016 8.7* 8.9 - 10.3 mg/dL Final  . GFR calc non Af Amer 08/02/2016 36* >60 mL/min Final  .  GFR calc Af Amer 08/02/2016 41* >60 mL/min Final   Comment: (NOTE) The eGFR has been calculated using the CKD EPI equation. This calculation has not been validated in all clinical situations. eGFR's persistently <60 mL/min signify possible Chronic Kidney Disease.   . Anion gap 08/02/2016 6  5 - 15 Final  . WBC 08/03/2016 6.5  4.0 - 10.5 K/uL Final  . RBC 08/03/2016 3.37* 3.87 - 5.11 MIL/uL Final  . Hemoglobin 08/03/2016 10.2* 12.0 - 15.0 g/dL Final  . HCT 00/37/9444 31.3* 36.0 - 46.0 % Final  . MCV 08/03/2016 92.9  78.0 - 100.0 fL Final  . MCH 08/03/2016 30.3  26.0 - 34.0 pg Final  . MCHC 08/03/2016 32.6  30.0 - 36.0 g/dL Final  . RDW 61/90/1222 12.7  11.5 - 15.5 % Final  . Platelets 08/03/2016 214  150 - 400 K/uL Final  . Sodium 08/03/2016 141  135 - 145 mmol/L Final  . Potassium 08/03/2016 3.5  3.5 - 5.1 mmol/L Final  . Chloride 08/03/2016 109  101 - 111 mmol/L Final  . CO2 08/03/2016 27  22 - 32 mmol/L Final  . Glucose, Bld 08/03/2016 89  65 - 99 mg/dL Final  . BUN 41/14/6431 15  6 - 20 mg/dL Final  . Creatinine, Ser 08/03/2016 1.42* 0.44 - 1.00 mg/dL Final  . Calcium 42/76/7011 8.7* 8.9 - 10.3 mg/dL Final  . GFR calc non Af Amer 08/03/2016 37* >60 mL/min Final  . GFR calc Af Amer 08/03/2016 43* >60 mL/min Final   Comment: (NOTE) The eGFR has been calculated using the CKD EPI equation. This calculation has not been validated in all clinical situations. eGFR's persistently <60 mL/min signify possible Chronic Kidney Disease.   . Anion gap 08/03/2016 5  5 - 15 Final  . Specimen Description 08/08/2016 BLOOD LEFT HAND   Final  . Special Requests 08/08/2016 IN PEDIATRIC BOTTLE  5CC   Final  . Culture 08/08/2016 NO GROWTH 5 DAYS   Final  . Report Status 08/08/2016 08/08/2016 FINAL   Final  . Specimen  Description 08/08/2016 BLOOD LEFT ANTECUBITAL   Final  . Special Requests 08/08/2016 IN PEDIATRIC BOTTLE 5CC   Final  . Culture 08/08/2016 NO GROWTH 5 DAYS   Final  . Report Status 08/08/2016 08/08/2016 FINAL   Final  . WBC 08/04/2016 5.8  4.0 - 10.5 K/uL Final  . RBC 08/04/2016 3.36* 3.87 - 5.11 MIL/uL Final  . Hemoglobin 08/04/2016 10.2* 12.0 - 15.0 g/dL Final  . HCT 00/34/9611 31.1* 36.0 - 46.0 % Final  . MCV 08/04/2016 92.6  78.0 - 100.0 fL Final  . MCH 08/04/2016 30.4  26.0 - 34.0 pg Final  . MCHC 08/04/2016 32.8  30.0 - 36.0 g/dL Final  . RDW 64/35/3912 12.9  11.5 - 15.5 % Final  . Platelets 08/04/2016 221  150 - 400 K/uL Final  . Sodium 08/04/2016 141  135 - 145 mmol/L Final  . Potassium 08/04/2016 3.4* 3.5 - 5.1 mmol/L Final  . Chloride 08/04/2016 107  101 - 111 mmol/L Final  . CO2 08/04/2016 27  22 - 32 mmol/L Final  . Glucose, Bld 08/04/2016 87  65 - 99 mg/dL Final  . BUN 25/83/4621 10  6 - 20 mg/dL Final  . Creatinine, Ser 08/04/2016 1.15* 0.44 - 1.00 mg/dL Final  . Calcium 94/71/2527 8.7* 8.9 - 10.3 mg/dL Final  . GFR calc non Af Amer 08/04/2016 47* >60 mL/min Final  . GFR calc Af Amer 08/04/2016 55* >60  mL/min Final   Comment: (NOTE) The eGFR has been calculated using the CKD EPI equation. This calculation has not been validated in all clinical situations. eGFR's persistently <60 mL/min signify possible Chronic Kidney Disease.   . Anion gap 08/04/2016 7  5 - 15 Final  . Magnesium 08/05/2016 2.4  1.7 - 2.4 mg/dL Final  . Sodium 08/05/2016 140  135 - 145 mmol/L Final  . Potassium 08/05/2016 4.5  3.5 - 5.1 mmol/L Final  . Chloride 08/05/2016 112* 101 - 111 mmol/L Final  . CO2 08/05/2016 24  22 - 32 mmol/L Final  . Glucose, Bld 08/05/2016 86  65 - 99 mg/dL Final  . BUN 08/05/2016 8  6 - 20 mg/dL Final  . Creatinine, Ser 08/05/2016 1.07* 0.44 - 1.00 mg/dL Final  . Calcium 08/05/2016 8.5* 8.9 - 10.3 mg/dL Final  . GFR calc non Af Amer 08/05/2016 52* >60 mL/min Final  .  GFR calc Af Amer 08/05/2016 60* >60 mL/min Final   Comment: (NOTE) The eGFR has been calculated using the CKD EPI equation. This calculation has not been validated in all clinical situations. eGFR's persistently <60 mL/min signify possible Chronic Kidney Disease.   . Anion gap 08/05/2016 4* 5 - 15 Final    Ct Abdomen Pelvis Wo Contrast  Result Date: 08/24/2016 CLINICAL DATA:  70 y/o F; decreasing hemoglobin with concern for pelvic hematoma. EXAM: CT ABDOMEN AND PELVIS WITHOUT CONTRAST TECHNIQUE: Multidetector CT imaging of the abdomen and pelvis was performed following the standard protocol without IV contrast. COMPARISON:  08/23/2016 renal ultrasound. 08/03/2016 CT abdomen and pelvis. FINDINGS: Lower chest: Subsegmental basilar atelectasis. Trace bilateral pleural effusions. Decreased attenuation of cardiac blood pool indicates anemia. Mild coronary artery calcification. Hepatobiliary: No focal liver lesion. Gradient increased attenuation within the gallbladder may represent sludge or small stones. No intra or extrahepatic biliary ductal dilatation. Pancreas: Unremarkable. No pancreatic ductal dilatation or surrounding inflammatory changes. Spleen: Normal in size without focal abnormality. Adrenals/Urinary Tract: Mild bilateral hydronephrosis similar to prior renal sonogram. In the bladder is collapsed around a Foley catheter. No focal renal lesion. Stomach/Bowel: No obstructive or inflammatory changes of bowel. Large volume of stool in the rectal vault may represent constipation. Vascular/Lymphatic: Aortic atherosclerosis. No enlarged abdominal or pelvic lymph nodes. Reproductive: Stable left adnexal pelvic mass measuring up to 4.6 cm. The right adnexa and uterus are unremarkable. Other: No abdominal wall hernia or abnormality. No abdominopelvic ascites. Musculoskeletal: Chronic overriding right femoral neck fracture without femoral head dislocation and acute left-sided intratrochanteric overriding  proximal femur fracture without femoral head dislocation again seen. Multiple chronic fracture deformities of the superior and inferior pubic rami. Stable compression deformities of the T12 and L2 superior endplates. There is stranding of subcutaneous fat and mild swelling of the left-sided upper thigh muscles probably representing reactive edema. No discrete hematoma is identified. IMPRESSION: 1. No hematoma is identified. 2. Trace bilateral pleural effusions. 3. Gallbladder sludge or small stones. No evidence for cholecystitis. 4. Mild persistent hydronephrosis stable from prior renal ultrasound given differences in technique. 5. Chronic right and acute left proximal femoral fractures. Fat stranding and mild swelling of left upper thigh muscles is probably reactive edema. No discrete hematoma identified. 6. Stable T12 and L2 mild compression deformities. 7. **An incidental finding of potential clinical significance has been found. Left adnexal mass measuring up to 4.6 cm, further characterization with ultrasound is recommended. This recommendation follows ACR consensus guidelines: White Paper of the ACR Incidental Findings Committee II on Adnexal Findings. J  Am Coll Radiol [phone].** Electronically Signed   By: Kristine Garbe M.D.   On: 08/24/2016 22:34   Ct Abdomen Pelvis Wo Contrast  Result Date: 08/03/2016 CLINICAL DATA:  Abdominal pain.  Altered mental status. EXAM: CT ABDOMEN AND PELVIS WITHOUT CONTRAST TECHNIQUE: Multidetector CT imaging of the abdomen and pelvis was performed following the standard protocol without IV contrast. COMPARISON:  Lumbar spine radiographs 12/19/2012. FINDINGS: Lower chest: Minimal atelectasis in the lung bases. Small calcified right hilar lymph nodes. Normal heart size. Hepatobiliary: The gallbladder is absent. No biliary dilatation is seen. No focal liver abnormality is identified within limitations of noncontrast technique and beam hardening from the  patient's arms. Pancreas: Unremarkable. Spleen: Punctate calcified granuloma. Adrenals/Urinary Tract: Unremarkable adrenal glands. Moderate right greater than left hydronephrosis and moderate to severe right and moderate left diffuse hydroureter. No urinary tract calculi identified. Moderate right renal cortical thinning. Mild-to-moderate irregular wall thickening diffusely involving the bladder, which is mildly distended. Stomach/Bowel: The stomach is within normal limits. A small duodenal diverticulum is noted. There is no evidence of bowel obstruction or wall thickening. The appendix is absent. A moderate amount of stool is present in the rectal vault. Vascular/Lymphatic: Abdominal aortic atherosclerosis without aneurysm. No enlarged lymph nodes are identified. Reproductive: Unremarkable uterus and right ovary. A homogeneous lead mildly hyperattenuating left adnexal mass measures 4.6 x 3.9 cm. Other: No intraperitoneal free fluid. No abdominal wall mass or hernia. Musculoskeletal: Old left rib fractures. Chronic right femoral neck fracture with superior displacement. Chronic sacral fracture at the S2-3 level with anterior angulation of the distal sacrum and coccyx. Mild superior endplate compression fractures at T12 and L2 are new from 2014 but may be chronic. IMPRESSION: 1. Diffuse bladder wall thickening which may reflect cystitis. 2. Moderate to severe right and moderate left hydroureteronephrosis. No obstructing calculi. This may be secondary to #1. 3. Mild T12 and L2 compression fractures, new from 2014 though of indeterminate acuity. 4. **An incidental finding of potential clinical significance has been found. 4.6 cm left adnexal mass. Nonurgent evaluation with pelvic ultrasound is recommended.** 5. Aortic atherosclerosis. Electronically Signed   By: Logan Bores M.D.   On: 08/03/2016 18:52   Dg Chest 2 View  Result Date: 07/31/2016 CLINICAL DATA:  Altered mental status.  Seizure activity. EXAM: CHEST   2 VIEW COMPARISON:  12/23/2015 FINDINGS: Lateral view degraded by patient arm position. Artifact degraded lateral view posteriorly. Moderate right hemidiaphragm elevation. Chin overlies the apices. Patient rotated to the right on the frontal. Midline trachea. Mild cardiomegaly with a tortuous thoracic aorta. No pleural effusion or pneumothorax. Remote left rib trauma. Bibasilar scarring or atelectasis. No congestive failure. IMPRESSION: No acute cardiopulmonary disease. Cardiomegaly without congestive failure. Electronically Signed   By: Abigail Miyamoto M.D.   On: 07/31/2016 19:20   Ct Head Wo Contrast  Result Date: 07/31/2016 CLINICAL DATA:  Seizure activity. Altered mental status. History of dementia. EXAM: CT HEAD WITHOUT CONTRAST TECHNIQUE: Contiguous axial images were obtained from the base of the skull through the vertex without intravenous contrast. COMPARISON:  07/04/2016 FINDINGS: Brain: Cerebral atrophy for age. Moderate low density in the periventricular white matter likely related to small vessel disease. Cerebellar atrophy is also age advanced. No mass lesion, hemorrhage, hydrocephalus, acute infarct, intra-axial, or extra-axial fluid collection. Vascular: Bilateral intracranial carotid and left vertebral artery atherosclerosis. Skull: Normal Sinuses/Orbits: Normal orbits and globes. Clear paranasal sinuses and mastoid air cells. Other: None. IMPRESSION: 1.  No acute intracranial abnormality. 2.  Cerebral atrophy and  small vessel ischemic change. Electronically Signed   By: Abigail Miyamoto M.D.   On: 07/31/2016 19:23   US Renal  Result Date: 08/22/2016 CLINICAL DATA:  Patient with elevated creatinine. EXAM: RENAL / URINARY TRACT ULTRASOUND COMPLETE COMPARISON:  CT abdomen pelvis 08/03/2016. FINDINGS: Right Kidney: Length: 13.4 cm. Mild renal cortical thinning increased renal cortical echogenicity. Moderate right hydronephrosis. Left Kidney: Length: 11.4 cm. Mild renal cortical thinning. Increased  renal cortical echogenicity. Moderate hydronephrosis. Bladder: Circumferential wall thickening of the urinary bladder. IMPRESSION: Limited exam. Moderate bilateral hydronephrosis. Nonspecific circumferential wall thickening of the urinary bladder. Electronically Signed   By: Lovey Newcomer M.D.   On: 08/22/2016 12:28   US Renal Port  Result Date: 08/23/2016 CLINICAL DATA:  Follow-up on hydronephrosis after bladder drainage. EXAM: RENAL / URINARY TRACT ULTRASOUND COMPLETE COMPARISON:  08/22/2016 FINDINGS: Right Kidney: Length: 10 cm. Mild residual hydronephrosis with significant decompression since previous study. Normal renal parenchymal echotexture and thickness. Left Kidney: Length: 8.6 cm. Limited visualization of the left kidney due to poor penetration and overlying bowel and rib shadows. Interval resolution of hydronephrosis. No significant residual hydronephrosis. Parenchymal thickness appears intact. Bladder: Bladder is decompressed with a Foley catheter. IMPRESSION: Significant decrease in hydronephrosis since previous study with mild residual hydronephrosis on the right and no significant hydronephrosis on the left. Bladder is decompressed with a Foley catheter. Electronically Signed   By: Lucienne Capers M.D.   On: 08/23/2016 23:45   Dg Hip Unilat W Or W/o Pelvis Min 4 Views Left  Result Date: 08/21/2016 CLINICAL DATA:  LEFT hip pain, fall last weekend. EXAM: DG HIP (WITH OR WITHOUT PELVIS) 4+V LEFT COMPARISON:  CT abdomen and pelvis August 03, 2016 FINDINGS: Acute comminuted LEFT hip intertrochanteric fracture nonunited with impaction, mild varus angulation of the distal bony fragments. Osteopenia. Old bilateral superior and inferior pubic rami fractures. Old RIGHT femoral head fracture with overriding bony fragments. IMPRESSION: Acute displaced LEFT femur intertrochanteric fracture without dislocation. Old nonunited RIGHT femoral neck fracture. Old bilateral pelvic fractures. Electronically  Signed   By: Elon Alas M.D.   On: 08/21/2016 19:58     Assessment/Plan   ICD-9-CM ICD-10-CM   1. Hydronephrosis, unspecified hydronephrosis type 591 N13.30   2. Urine retention due to neurogenic bladder 788.20 R33.9   3. Closed displaced intertrochanteric fracture of left femur with delayed healing, subsequent encounter V54.13 S72.142G   4. Protein-calorie malnutrition, severe 262 E43   5. Dementia with behavioral disturbance, unspecified dementia type 294.21 F03.91   6. Anxiety state 300.00 F41.1   7. Essential hypertension 401.9 I10   8. Anemia of chronic disease 285.29 D63.8   9. Recurrent falls V15.88 R29.6   10. Alcohol abuse 305.00 F10.10     F/u with urology Dr Diona Fanti as scheduled 12/21st  Check BMP and CBC  Foley cath care as indicated due to neurogenic bladder  Nutritional supplements as ordered  PT/OT/ST as ordered  F/u with specialists as scheduled  GOAL: short term rehab with potential for long term care. Communicated with pt and nursing.  Will follow  Devin Ganaway S. Perlie Gold  [facility] [address] [place], [place] [address] [phone] Cell (Monday-Friday 8 AM - 5 PM) [phone] After 5 PM and follow prompts

## 2016-08-31 LAB — CBC AND DIFFERENTIAL
HCT: 28 % — AB (ref 36–46)
HEMOGLOBIN: 9.4 g/dL — AB (ref 12.0–16.0)
NEUTROS ABS: 6 /uL
PLATELETS: 260 10*3/uL (ref 150–399)
WBC: 8.6 10^3/mL

## 2016-08-31 LAB — BASIC METABOLIC PANEL
BUN: 16 mg/dL (ref 4–21)
Creatinine: 0.9 mg/dL (ref 0.5–1.1)
Glucose: 141 mg/dL
Potassium: 3.9 mmol/L (ref 3.4–5.3)
Sodium: 140 mmol/L (ref 137–147)

## 2016-10-20 ENCOUNTER — Non-Acute Institutional Stay (SKILLED_NURSING_FACILITY): Payer: Medicare Other | Admitting: Adult Health

## 2016-10-20 DIAGNOSIS — D638 Anemia in other chronic diseases classified elsewhere: Secondary | ICD-10-CM | POA: Diagnosis not present

## 2016-10-20 DIAGNOSIS — F39 Unspecified mood [affective] disorder: Secondary | ICD-10-CM

## 2016-10-20 DIAGNOSIS — S72142G Displaced intertrochanteric fracture of left femur, subsequent encounter for closed fracture with delayed healing: Secondary | ICD-10-CM | POA: Diagnosis not present

## 2016-10-20 DIAGNOSIS — R339 Retention of urine, unspecified: Secondary | ICD-10-CM | POA: Diagnosis not present

## 2016-10-20 DIAGNOSIS — I10 Essential (primary) hypertension: Secondary | ICD-10-CM | POA: Diagnosis not present

## 2016-10-20 DIAGNOSIS — N182 Chronic kidney disease, stage 2 (mild): Secondary | ICD-10-CM | POA: Diagnosis not present

## 2016-10-20 DIAGNOSIS — F1010 Alcohol abuse, uncomplicated: Secondary | ICD-10-CM

## 2016-10-20 DIAGNOSIS — E43 Unspecified severe protein-calorie malnutrition: Secondary | ICD-10-CM

## 2016-10-21 ENCOUNTER — Telehealth: Payer: Self-pay | Admitting: Adult Health

## 2016-10-21 NOTE — Telephone Encounter (Signed)
I was called by [facility] to state that Ms. Chaisson was positive for a left lower ext DVT.  Staff denied that the resident had any chest pain, sob, or unstable VS.  She was started on Xarelto 15 mg BID for 21 days and then 20 mg qd thereafter. A CBC was ordered for 1 week from today to monitor any drop in H/H as the resident has a hx of anemia and femur fracture.  She was on Lovenox (prophylaxis) at discharge in Nov but this was discontinued per the nurse. These orders were given to [place] at [facility] and read back/verified.  I discussed this with Dr. Gildardo Cranker as well. Staff was instructed to monitor her closely.

## 2016-11-03 ENCOUNTER — Emergency Department (HOSPITAL_COMMUNITY): Payer: Medicare Other

## 2016-11-03 ENCOUNTER — Emergency Department (HOSPITAL_COMMUNITY)
Admission: EM | Admit: 2016-11-03 | Discharge: 2016-11-03 | Disposition: A | Discharge status: Home / Self Care | Payer: Medicare Other | Attending: Emergency Medicine | Admitting: Emergency Medicine

## 2016-11-03 DIAGNOSIS — R29810 Facial weakness: Secondary | ICD-10-CM | POA: Diagnosis not present

## 2016-11-03 DIAGNOSIS — R791 Abnormal coagulation profile: Secondary | ICD-10-CM | POA: Diagnosis not present

## 2016-11-03 DIAGNOSIS — I129 Hypertensive chronic kidney disease with stage 1 through stage 4 chronic kidney disease, or unspecified chronic kidney disease: Secondary | ICD-10-CM | POA: Diagnosis not present

## 2016-11-03 DIAGNOSIS — T83511A Infection and inflammatory reaction due to indwelling urethral catheter, initial encounter: Secondary | ICD-10-CM | POA: Diagnosis not present

## 2016-11-03 DIAGNOSIS — Z85828 Personal history of other malignant neoplasm of skin: Secondary | ICD-10-CM | POA: Diagnosis not present

## 2016-11-03 DIAGNOSIS — Y731 Therapeutic (nonsurgical) and rehabilitative gastroenterology and urology devices associated with adverse incidents: Secondary | ICD-10-CM | POA: Diagnosis not present

## 2016-11-03 DIAGNOSIS — N182 Chronic kidney disease, stage 2 (mild): Secondary | ICD-10-CM | POA: Insufficient documentation

## 2016-11-03 DIAGNOSIS — Z79899 Other long term (current) drug therapy: Secondary | ICD-10-CM | POA: Diagnosis not present

## 2016-11-03 DIAGNOSIS — N390 Urinary tract infection, site not specified: Secondary | ICD-10-CM | POA: Diagnosis present

## 2016-11-03 LAB — URINALYSIS, ROUTINE W REFLEX MICROSCOPIC
Bilirubin Urine: NEGATIVE
Glucose, UA: NEGATIVE mg/dL
Ketones, ur: NEGATIVE mg/dL
Nitrite: POSITIVE — AB
Protein, ur: 100 mg/dL — AB
Specific Gravity, Urine: 1.01 (ref 1.005–1.030)
pH: 9 — ABNORMAL HIGH (ref 5.0–8.0)

## 2016-11-03 LAB — COMPREHENSIVE METABOLIC PANEL WITH GFR
ALT: 10 U/L — ABNORMAL LOW (ref 14–54)
AST: 18 U/L (ref 15–41)
Albumin: 2.2 g/dL — ABNORMAL LOW (ref 3.5–5.0)
Alkaline Phosphatase: 132 U/L — ABNORMAL HIGH (ref 38–126)
Anion gap: 11 (ref 5–15)
BUN: 14 mg/dL (ref 6–20)
CO2: 29 mmol/L (ref 22–32)
Calcium: 9 mg/dL (ref 8.9–10.3)
Chloride: 100 mmol/L — ABNORMAL LOW (ref 101–111)
Creatinine, Ser: 0.76 mg/dL (ref 0.44–1.00)
GFR calc Af Amer: >60 mL/min
GFR calc non Af Amer: >60 mL/min
Glucose, Bld: 76 mg/dL (ref 65–99)
Potassium: 3.5 mmol/L (ref 3.5–5.1)
Sodium: 140 mmol/L (ref 135–145)
Total Bilirubin: <0.1 mg/dL — ABNORMAL LOW (ref 0.3–1.2)
Total Protein: 5.8 g/dL — ABNORMAL LOW (ref 6.5–8.1)

## 2016-11-03 LAB — RAPID URINE DRUG SCREEN, HOSP PERFORMED
Amphetamines: NOT DETECTED
Barbiturates: NOT DETECTED
Benzodiazepines: NOT DETECTED
COCAINE: NOT DETECTED
OPIATES: NOT DETECTED
TETRAHYDROCANNABINOL: NOT DETECTED

## 2016-11-03 LAB — I-STAT TROPONIN, ED: Troponin i, poc: 0 ng/mL (ref 0.00–0.08)

## 2016-11-03 LAB — ETHANOL: Alcohol, Ethyl (B): <5 mg/dL

## 2016-11-03 LAB — DIFFERENTIAL
Basophils Absolute: 0 K/uL (ref 0.0–0.1)
Basophils Relative: 0 %
Eosinophils Absolute: 0.2 K/uL (ref 0.0–0.7)
Eosinophils Relative: 3 %
Lymphocytes Relative: 32 %
Lymphs Abs: 2.2 K/uL (ref 0.7–4.0)
Monocytes Absolute: 0.7 K/uL (ref 0.1–1.0)
Monocytes Relative: 10 %
Neutro Abs: 3.8 K/uL (ref 1.7–7.7)
Neutrophils Relative %: 55 %

## 2016-11-03 LAB — CBC
HCT: 31.3 % — ABNORMAL LOW (ref 36.0–46.0)
Hemoglobin: 9.4 g/dL — ABNORMAL LOW (ref 12.0–15.0)
MCH: 28.5 pg (ref 26.0–34.0)
MCHC: 30 g/dL (ref 30.0–36.0)
MCV: 94.8 fL (ref 78.0–100.0)
PLATELETS: 220 10*3/uL (ref 150–400)
RBC: 3.3 MIL/uL — ABNORMAL LOW (ref 3.87–5.11)
RDW: 14.3 % (ref 11.5–15.5)
WBC: 6.9 10*3/uL (ref 4.0–10.5)

## 2016-11-03 LAB — PROTIME-INR
INR: 1.11
Prothrombin Time: 14.3 s (ref 11.4–15.2)

## 2016-11-03 LAB — VALPROIC ACID LEVEL: Valproic Acid Lvl: 34 ug/mL — ABNORMAL LOW (ref 50.0–100.0)

## 2016-11-03 LAB — APTT: aPTT: 33 seconds (ref 24–36)

## 2016-11-03 MED ORDER — SODIUM CHLORIDE 0.9 % IV BOLUS (SEPSIS)
500.0000 mL | Freq: Once | INTRAVENOUS | Status: AC
  Administered 2016-11-03: 500 mL via INTRAVENOUS

## 2016-11-03 MED ORDER — CEPHALEXIN 500 MG PO CAPS: 500.0000 mg | ORAL_CAPSULE | Freq: Three times a day (TID) | ORAL | 0 refills | Status: DC

## 2016-11-03 MED ORDER — DEXTROSE 5 % IV SOLN
1.0000 g | Freq: Once | INTRAVENOUS | Status: AC
  Administered 2016-11-03: 1 g via INTRAVENOUS
  Filled 2016-11-03: qty 10

## 2016-11-03 NOTE — ED Notes (Addendum)
Spoke with Seth Bake at [facility] patients son to notify them of patients pending return to facility and her diagnosis and plan of care.

## 2016-11-03 NOTE — ED Notes (Signed)
Patient cleaned and new diaper placed

## 2016-11-03 NOTE — ED Notes (Signed)
Removed indwelling catheter that patient arrived with from nursing facility. After discussion with MD foley not replaced, lots of sediment and cloudiness noted in tubing prior to removal.

## 2016-11-03 NOTE — ED Provider Notes (Signed)
[facility] DEPT Provider Note   CSN: [id number] Arrival date & time: 11/03/16  [id number]     History   Chief Complaint Chief Complaint  Patient presents with  . Dysphagia    HPI Tara Shaffer is a 71 y.o. female.  HPI Patient presents from nursing home by EMS. Patient is normally verbal wheelchair-bound. Was last seen normal yesterday evening. Today patient has not been speaking per nursing home staff and EMS. Recently diagnosed with DVT and started on Xeralto. Level V caveat. Past Medical History:  Diagnosis Date  . Alcohol abuse 02/27/2012  . ALLERGIC RHINITIS 06/23/2007  . ANXIETY 06/23/2007  . ANXIETY DEPRESSION 06/12/2009  . Cancer ([place])    skin cx/ hx melanoma  . COLONIC POLYPS, HX OF 03/08/2002  . 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 Dementia   . DIVERTICULOSIS, COLON 08/04/2007  . GERD 06/23/2007  . HYPERLIPIDEMIA 06/24/2007  . HYPERTENSION 06/23/2007  . Irritable bowel syndrome 06/23/2007  . LOW BACK PAIN 06/24/2007  . MELANOMA, HX OF 08/04/2007  . Memory dysfunction 06/23/2011    Patient Active Problem List   Diagnosis Date Noted  . Intertrochanteric fracture of left hip ([place]) 08/27/2016  . Urine retention 08/27/2016  . Hydronephrosis   . Protein-calorie malnutrition, severe 08/23/2016  . ARF (acute renal failure) ([place]) 08/21/2016  . UTI (urinary tract infection) 12/24/2015  . AKI (acute kidney injury) ([place]) 12/23/2015  . Anemia of chronic disease 08/29/2015  . CKD (chronic kidney disease) stage 2, GFR 60-89 ml/min 08/29/2015  . Mood disorder ([place]) 06/11/2015  . DVT, femoral, chronic ([employer]) 03/20/2015  . Pressure ulcer 03/14/2015  . Recurrent falls 10/31/2014  . Impaired glucose tolerance 10/31/2014  . 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 Hypokalemia 03/21/2012  . Alcohol abuse 02/27/2012  . Dementia with behavioral disturbance 06/23/2011  . 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  DIVERTICULOSIS, COLON 08/04/2007  . MELANOMA, HX OF 08/04/2007  . HLD (hyperlipidemia) 06/24/2007  . Anxiety state 06/23/2007  . Essential hypertension 06/23/2007  . ALLERGIC RHINITIS 06/23/2007  . GERD 06/23/2007  . Irritable bowel syndrome 06/23/2007    Past Surgical History:  Procedure Laterality Date  . APPENDECTOMY    . CESAREAN SECTION     x 2  . COLONOSCOPY      OB History    No data available       Home Medications    Prior to Admission medications   Medication Sig Start Date End Date Taking? Authorizing Provider  acetaminophen (TYLENOL) 500 MG tablet Take 500 mg by mouth every 4 (four) hours as needed for mild pain.    Historical Provider, MD  alum & mag hydroxide-simeth ([place]) 200-200-20 MG/5ML suspension Take 30 mLs by mouth every 6 (six) hours as needed for indigestion or heartburn.    Historical Provider, MD  Amino Acids-Protein Hydrolys (FEEDING SUPPLEMENT, PRO-STAT SUGAR FREE 64,) LIQD Take 30 mLs by mouth 2 (two) times daily.    Historical Provider, MD  amLODipine (NORVASC) 5 MG tablet Take 1 tablet (5 mg total) by mouth daily. 03/17/15   [name], MD  cephALEXin (KEFLEX) 500 MG capsule Take 1 capsule (500 mg total) by mouth 3 (three) times daily. 11/03/16   Julianne Rice, MD  divalproex (DEPAKOTE SPRINKLE) 125 MG capsule Take 250 mg by mouth 3 (three) times daily.     Historical Provider, MD  ENSURE PLUS (ENSURE PLUS) LIQD Take 118.5 mLs by mouth 3 (three) times daily.  Historical Provider, MD  ferrous sulfate 325 (65 FE) MG tablet Take 1 tablet (325 mg total) by mouth 3 (three) times daily after meals. 03/17/15   [name], MD  folic acid (FOLVITE) 1 MG tablet Take 1 tablet (1 mg total) by mouth daily. 03/17/15   [name], MD  guaifenesin (ROBITUSSIN) 100 MG/5ML syrup Take 200 mg by mouth 3 (three) times daily as needed for cough.    Historical Provider, MD  lanolin ointment Apply 1 application topically as needed for dry skin.     Historical Provider, MD  LORazepam (ATIVAN) 0.5 MG tablet Take 1 tablet (0.5 mg total) by mouth every 8 (eight) hours as needed for anxiety. 08/27/16   [name], DO  magnesium hydroxide (MILK OF MAGNESIA) 400 MG/5ML suspension Take 30 mLs by mouth daily as needed for mild constipation.    Historical Provider, MD  Memantine HCl-Donepezil HCl (NAMZARIC) 28-10 MG CP24 Take 1 capsule by mouth every morning.    Historical Provider, MD  Multiple Vitamin (MULTIVITAMIN WITH MINERALS) TABS tablet Take 1 tablet by mouth daily. 03/17/15   [name], MD  QUEtiapine (SEROQUEL) 25 MG tablet Take 0.5 tablets (12.5 mg total) by mouth 2 (two) times daily. 08/26/16   [name], MD  thiamine 100 MG tablet Take 1 tablet (100 mg total) by mouth daily. 03/17/15   [name], MD  traMADol Veatrice Bourbon) 50 MG tablet Take one tablet by mouth every 8 hours for pain 08/27/16   [name], DO    Family History Family History  Problem Relation Age of Onset  . Colon cancer Cousin   . Hypertension Other   . Diabetes Other     Social History Social History  Substance Use Topics  . Smoking status: Never Smoker  . Smokeless tobacco: Never Used  . Alcohol use 25.2 oz/week    42 Glasses of wine per week     Allergies   Codeine phosphate   Review of Systems Review of Systems  Unable to perform ROS: Mental status change     Physical Exam Updated Vital Signs BP 106/59   Pulse 67   Temp 98 F (36.7 C)   Resp 14   SpO2 99%   Physical Exam  Constitutional: She appears well-developed and well-nourished. No distress.  HENT:  Head: Normocephalic and atraumatic.  Mouth/Throat: Oropharynx is clear and moist.  Eyes: EOM are normal. Pupils are equal, round, and reactive to light.  Pupils are 3 mm and minimally reactive.  Neck: Normal range of motion. Neck supple.  No meningismus  Cardiovascular: Normal rate and regular rhythm.  Exam reveals no gallop and no friction rub.   No murmur  heard. Pulmonary/Chest: Effort normal and breath sounds normal. No respiratory distress. She has no wheezes. She has no rales. She exhibits no tenderness.  Abdominal: Soft. Bowel sounds are normal. There is no tenderness. There is no rebound and no guarding.  Musculoskeletal: Normal range of motion. She exhibits no edema or tenderness.  Neurological: She is alert.  Patient is not responding to questioning. She is not following commands. She is awake and making eye contact.  Skin: Skin is warm and dry. Capillary refill takes less than 2 seconds. No rash noted. No erythema.  Nursing note and vitals reviewed.    ED Treatments / Results  Labs (all labs ordered are listed, but only abnormal results are displayed) Labs Reviewed  URINE CULTURE - Abnormal; Notable for the following:  Result Value   Culture MULTIPLE SPECIES PRESENT, SUGGEST RECOLLECTION (*)    All other components within normal limits  CBC - Abnormal; Notable for the following:    RBC 3.30 (*)    Hemoglobin 9.4 (*)    HCT 31.3 (*)    All other components within normal limits  COMPREHENSIVE METABOLIC PANEL - Abnormal; Notable for the following:    Chloride 100 (*)    Total Protein 5.8 (*)    Albumin 2.2 (*)    ALT 10 (*)    Alkaline Phosphatase 132 (*)    Total Bilirubin <0.1 (*)    All other components within normal limits  URINALYSIS, ROUTINE W REFLEX MICROSCOPIC - Abnormal; Notable for the following:    Color, Urine AMBER (*)    APPearance TURBID (*)    pH 9.0 (*)    Hgb urine dipstick SMALL (*)    Protein, ur 100 (*)    Nitrite POSITIVE (*)    Leukocytes, UA LARGE (*)    Bacteria, UA MANY (*)    Squamous Epithelial / LPF 0-5 (*)    Non Squamous Epithelial 0-5 (*)    All other components within normal limits  VALPROIC ACID LEVEL - Abnormal; Notable for the following:    Valproic Acid Lvl 34 (*)    All other components within normal limits  ETHANOL  PROTIME-INR  APTT  DIFFERENTIAL  RAPID URINE DRUG  SCREEN, HOSP PERFORMED  I-STAT TROPOININ, ED    EKG  EKG Interpretation  Date/Time:  Wednesday November 03 2016 09:53:19 EST Ventricular Rate:  66 PR Interval:    QRS Duration: 90 QT Interval:  424 QTC Calculation: 445 R Axis:   -9 Text Interpretation:  sinus rhythm Borderline T abnormalities, anterior leads Artifact Confirmed by Hazle Coca [phone]) on 11/04/2016 7:33:36 PM       Radiology No results found.  Procedures Procedures (including critical care time)  Medications Ordered in ED Medications  sodium chloride 0.9 % bolus 500 mL (0 mLs Intravenous Stopped 11/03/16 1242)  cefTRIAXone (ROCEPHIN) 1 g in dextrose 5 % 50 mL IVPB (0 g Intravenous Stopped 11/03/16 1319)  sodium chloride 0.9 % bolus 500 mL (0 mLs Intravenous Stopped 11/03/16 1445)     Initial Impression / Assessment and Plan / ED Course  I have reviewed the triage vital signs and the nursing notes.  Pertinent labs & imaging results that were available during my care of the patient were reviewed by me and considered in my medical decision making (see chart for details).    Patient is now answering questions appropriately. Appears to be at her baseline mental status. Evidence of urinary tract infection on UA. We'll give IV Rocephin in the emergency department. Anticipate discharge back to nursing home facility.   Final Clinical Impressions(s) / ED Diagnoses   Final diagnoses:  Urinary tract infection associated with indwelling urethral catheter, initial encounter [facility])    New Prescriptions Discharge Medication List as of 11/03/2016  2:23 PM    START taking these medications   Details  cephALEXin (KEFLEX) 500 MG capsule Take 1 capsule (500 mg total) by mouth 3 (three) times daily., Starting Wed 11/03/2016, Print         Julianne Rice, MD 11/05/16 [phone]

## 2016-11-03 NOTE — ED Notes (Signed)
Spoke with patients son [name] and he is aware that patient is in ED and I will call when disposition made

## 2016-11-03 NOTE — ED Notes (Signed)
Notified [facility] for transportation back home 

## 2016-11-03 NOTE — ED Notes (Signed)
Back to [facility] by [facility]

## 2016-11-03 NOTE — ED Triage Notes (Addendum)
Pt in from [facility] SNF, per report pt at baseline able to verbalize needs, pt unable to speak at this time, pt follows commands, pt has baseline L sided facial droop, pt no new weakness noted, ,pt dx with DVT x2 wks ago, pt taking blood thinners, pt wheelchair bound, pt vitals WNL, pt LSN last night, per EMS staff last night did not reports any changes in pt, [facility] unit coordinator [phone]

## 2016-11-04 LAB — URINE CULTURE

## 2016-11-10 ENCOUNTER — Non-Acute Institutional Stay (SKILLED_NURSING_FACILITY): Payer: Medicare Other | Admitting: Adult Health

## 2016-11-10 DIAGNOSIS — I10 Essential (primary) hypertension: Secondary | ICD-10-CM | POA: Diagnosis not present

## 2016-11-10 DIAGNOSIS — L03116 Cellulitis of left lower limb: Secondary | ICD-10-CM

## 2016-11-23 ENCOUNTER — Non-Acute Institutional Stay (SKILLED_NURSING_FACILITY): Payer: Medicare Other | Admitting: Adult Health

## 2016-11-23 ENCOUNTER — Encounter: Payer: Self-pay | Admitting: Adult Health

## 2016-11-23 DIAGNOSIS — L89154 Pressure ulcer of sacral region, stage 4: Secondary | ICD-10-CM | POA: Insufficient documentation

## 2016-11-23 DIAGNOSIS — S72142G Displaced intertrochanteric fracture of left femur, subsequent encounter for closed fracture with delayed healing: Secondary | ICD-10-CM

## 2016-11-23 DIAGNOSIS — N1330 Unspecified hydronephrosis: Secondary | ICD-10-CM

## 2016-11-23 DIAGNOSIS — F1010 Alcohol abuse, uncomplicated: Secondary | ICD-10-CM | POA: Diagnosis not present

## 2016-11-23 DIAGNOSIS — E43 Unspecified severe protein-calorie malnutrition: Secondary | ICD-10-CM

## 2016-11-23 DIAGNOSIS — R339 Retention of urine, unspecified: Secondary | ICD-10-CM | POA: Diagnosis not present

## 2016-11-23 DIAGNOSIS — I10 Essential (primary) hypertension: Secondary | ICD-10-CM | POA: Diagnosis not present

## 2016-11-23 DIAGNOSIS — F39 Unspecified mood [affective] disorder: Secondary | ICD-10-CM

## 2016-11-23 DIAGNOSIS — N182 Chronic kidney disease, stage 2 (mild): Secondary | ICD-10-CM

## 2016-11-23 NOTE — Progress Notes (Signed)
Location:   [facility] Room Number: 228 A Place of Service:  SNF (31)   CODE STATUS: Full Code  Allergies  Allergen Reactions  . Codeine Phosphate Nausea Only    Chief Complaint  Patient presents with  . Medical Management of Chronic Issues    Routine Visit    HPI:  She is a long term resident of this facility being seen for the management of her chronic illnesses. Overall there is little change in her status. She is unable to fully participate in the hpi or ros. There are no nursing concerns at this time.   Past Medical History:  Diagnosis Date  . Alcohol abuse 02/27/2012  . ALLERGIC RHINITIS 06/23/2007  . ANXIETY 06/23/2007  . ANXIETY DEPRESSION 06/12/2009  . Cancer ([place])    skin cx/ hx melanoma  . COLONIC POLYPS, HX OF 03/08/2002  . 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 Dementia   . DIVERTICULOSIS, COLON 08/04/2007  . GERD 06/23/2007  . HYPERLIPIDEMIA 06/24/2007  . HYPERTENSION 06/23/2007  . Irritable bowel syndrome 06/23/2007  . LOW BACK PAIN 06/24/2007  . MELANOMA, HX OF 08/04/2007  . Memory dysfunction 06/23/2011    Past Surgical History:  Procedure Laterality Date  . APPENDECTOMY    . CESAREAN SECTION     x 2  . COLONOSCOPY      Social History   Social History  . Marital status: Married    Spouse name: [name]  . Number of children: [name]  . Years of education: [name]   Occupational History  . Not on file.   Social History Main Topics  . Smoking status: Never Smoker  . Smokeless tobacco: Never Used  . Alcohol use 25.2 oz/week    42 Glasses of wine per week  . Drug use: No  . Sexual activity: Yes    Birth control/ protection: Post-menopausal   Other Topics Concern  . Not on file   Social History Narrative  . No narrative on file   Family History  Problem Relation Age of Onset  . Colon cancer Cousin   . Hypertension Other   . Diabetes Other     Vitals:   11/23/16 1326  BP: (!) 100/55  Pulse: 84  Temp: 99.6 F (37.6  C)  Weight: 103 lb (46.7 kg)  Height: 5\' 5"  (1.651 m)     Patient's Medications  New Prescriptions   No medications on file  Previous Medications   ACETAMINOPHEN (TYLENOL) 500 MG TABLET    Take 500 mg by mouth every 4 (four) hours as needed for mild pain.   ALUM & MAG HYDROXIDE-SIMETH ([place]) [id number] MG/5ML SUSPENSION    Take 30 mLs by mouth every 6 (six) hours as needed for indigestion or heartburn.   DIVALPROEX (DEPAKOTE SPRINKLE) 125 MG CAPSULE    Take 250 mg by mouth 3 (three) times daily.    ESCITALOPRAM (LEXAPRO) 10 MG TABLET    Take 10 mg by mouth daily.   FERROUS SULFATE 325 (65 FE) MG TABLET    Take 1 tablet (325 mg total) by mouth 3 (three) times daily after meals.   FOLIC ACID (FOLVITE) 1 MG TABLET    Take 1 tablet (1 mg total) by mouth daily.   GUAIFENESIN (ROBITUSSIN) 100 MG/5ML SYRUP    Take 200 mg by mouth 3 (three) times daily as needed for cough.   LANOLIN OINTMENT    Apply 1 application topically as needed for dry skin.   LORAZEPAM (ATIVAN) 0.5  MG TABLET    Take 1 tablet (0.5 mg total) by mouth every 8 (eight) hours as needed for anxiety.   MAGNESIUM HYDROXIDE (MILK OF MAGNESIA) 400 MG/5ML SUSPENSION    Take 30 mLs by mouth daily as needed for mild constipation.   MEMANTINE HCL-DONEPEZIL HCL (NAMZARIC) 28-10 MG CP24    Take 1 capsule by mouth every morning.   MULTIPLE VITAMIN (MULTIVITAMIN WITH MINERALS) TABS TABLET    Take 1 tablet by mouth daily.   PROBIOTIC PRODUCT (PROBIOTIC DAILY) CAPS    Take 1 capsule by mouth 2 (two) times daily.   QUETIAPINE (SEROQUEL) 25 MG TABLET    Take 0.5 tablets (12.5 mg total) by mouth 2 (two) times daily.   RIVAROXABAN (XARELTO) 20 MG TABS TABLET    Take 20 mg by mouth daily.   THIAMINE 100 MG TABLET    Take 1 tablet (100 mg total) by mouth daily.   TRAMADOL (ULTRAM) 50 MG TABLET    Take one tablet by mouth every 8 hours for pain  Modified Medications   No medications on file  Discontinued Medications   AMINO ACIDS-PROTEIN  HYDROLYS (FEEDING SUPPLEMENT, PRO-STAT SUGAR FREE 64,) LIQD    Take 30 mLs by mouth 2 (two) times daily.   AMLODIPINE (NORVASC) 5 MG TABLET    Take 1 tablet (5 mg total) by mouth daily.   CEPHALEXIN (KEFLEX) 500 MG CAPSULE    Take 1 capsule (500 mg total) by mouth 3 (three) times daily.   ENSURE PLUS (ENSURE PLUS) LIQD    Take 118.5 mLs by mouth 3 (three) times daily.     SIGNIFICANT DIAGNOSTIC EXAMS  07-31-16: 1.  No acute intracranial abnormality. 2.  Cerebral atrophy and small vessel ischemic change  08-03-16: ct of abdomen and pelvis: 1. Diffuse bladder wall thickening which may reflect cystitis. 2. Moderate to severe right and moderate left hydroureteronephrosis. No obstructing calculi. This may be secondary to #1. 3. Mild T12 and L2 compression fractures, new from 2014 though of indeterminate acuity. 4. **An incidental finding of potential clinical significance has been found. 4.6 cm left adnexal mass. Nonurgent evaluation with pelvic ultrasound is recommended.** 5. Aortic atherosclerosis.  08-21-16: left hip x-ray: Acute displaced LEFT femur intertrochanteric fracture without dislocation. Old nonunited RIGHT femoral neck fracture. Old bilateral pelvic Fractures.  08-22-16: renal ultrasound: Limited exam. Moderate bilateral hydronephrosis. Nonspecific circumferential wall thickening of the urinary bladder.  08-23-16: renal ultrasound: Significant decrease in hydronephrosis since previous study with mild residual hydronephrosis on the right and no significant hydronephrosis on the left. Bladder is decompressed with a Foley catheter  08-24-16: ct of abdomen and pelvis: IMPRESSION: 1. No hematoma is identified. 2. Trace bilateral pleural effusions. 3. Gallbladder sludge or small stones. No evidence for cholecystitis. 4. Mild persistent hydronephrosis stable from prior renal ultrasound given differences in technique. 5. Chronic right and acute left proximal femoral fractures. Fat  stranding and mild swelling of left upper thigh muscles is probably reactive edema. No discrete hematoma identified. 6. Stable T12 and L2 mild compression deformities. 7. **An incidental finding of potential clinical significance has been found. Left adnexal mass measuring up to 4.6 cm, further characterization with ultrasound is recommended. T  LABS REVIEWED:   08-10-16: wbc 6.1; hgb 11.0; hct 34.0; mcv 97.1; plt 163; glucose 78; bun 21.9; creat 1.22; k+ 3.5; na++ 142  08-12-16: wbc 8.6; hgb 11.3; hct 34.9; mcv 97.7; plt 166; glucose 73; bun 30.5; creat 1.35; k+ 3.4 na++ 143;  08-21-16; wbc 16.0;  hgb 10.7; hct 33.6; mcv 97.1; plt 144; glucose 125; bun 90; bun 2.96; k+ 3.4; na++ 147; urine culture: yeast: diflucan  08-24-16: wbc 6.4; hgb 7.7; hct 24.7;mcv 102.1; plt 130; glucose 303; bun 46; creat 1.43; k+ 4.0; na++ 144; vit B 12: 458; folate 33.6; iron 49; TIBC 140; ferritin 450  08-26-16: wbc 10.0; hgb 8.9; hct 28.0; mcv 98.9; plt 177; glucose 111; bun 27; creat 1.08; k+ 3.6; na++ 140  08-31-16: wbc 8.6; hgb 9.4; hct 28.3; mcv 98.4; plt 260; glucose 141; bun 16.0; creat 0.94; k+ 3.9; na++ 140     Review of Systems  Unable to perform ROS: Dementia    Physical Exam  Constitutional: No distress.  Frail   Eyes: Conjunctivae are normal.  Neck: Neck supple. No JVD present. No thyromegaly present.  Cardiovascular: Normal rate, regular rhythm and intact distal pulses.   Respiratory: Effort normal and breath sounds normal. No respiratory distress. She has no wheezes.  GI: Soft. Bowel sounds are normal. She exhibits no distension. There is no tenderness.  Musculoskeletal: She exhibits no edema.  Did not voluntarily more lower extremities Has foley    Lymphadenopathy:    She has no cervical adenopathy.  Neurological: She is alert.  Skin: Skin is warm and dry. She is not diaphoretic. Sacrum stage IV: 8.3 x 3.6 x 0.2 cm treated with santyl and calcium alginate   Psychiatric: She has a normal  mood and affect.     ASSESSMENT/ PLAN:  1. Left  femur intertrochanteric fracture: she is being treated conservatively. She will follow up with orthopedics as indicated . Will continue ultram 50 mg every 8 hours will monitor   2.  CKD stage II bun/creat 16/0.94 will monitor  3. Urine retention: has bilateral hydronephrosis will monitor   4. Anemia: hgb 9.4; will continue iron three times daily   5. Hypertension: is currently not on medications will monitor   6. Dementia: is related to her alcohol abuse  will continue namzaric 28-10 mg daily take thiamine [id number] mg daily and folic acid 1 mg daily  7. Anxiety with depression:  Has mood disorder: will continue lexapro 10 mg daily depakote 250 mg three times daily seroquel 12.5 mg twice daily  and has ativan 0.5 mg every 8 hours as needed  8. Severe protein calorie malnutrition: will continue supplements per facility protocol and will continue prostat 30 cc twice daily  Her weight is 103 pounds.  Albumin 1.9 (08-23-16)  9. Chronic left femoral DVT: will continue xarelto 20 mg daily   MD is aware of resident's narcotic use and is in agreement with current plan of care. We will attempt to wean resident as apropriate     Ok Edwards NP [facility]  Contact [phone] Monday through Friday 8am- 5pm  After hours call [phone]

## 2016-11-27 NOTE — Progress Notes (Signed)
Location:   [facility]   Place of Service:  SNF (31)   CODE STATUS: full   Allergies  Allergen Reactions  . Codeine Phosphate Nausea Only    Chief Complaint  Patient presents with  . Medical Management of Chronic Issues    HPI:  She is a long term resident of this facility being seen for the management of her chronic illnesses. Staff reports that she has left lower extremity edema present. She is unable to participate in the hpi or ros; but did deny pain.   Past Medical History:  Diagnosis Date  . Alcohol abuse 02/27/2012  . ALLERGIC RHINITIS 06/23/2007  . ANXIETY 06/23/2007  . ANXIETY DEPRESSION 06/12/2009  . Cancer ([place])    skin cx/ hx melanoma  . COLONIC POLYPS, HX OF 03/08/2002  . 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 Dementia   . DIVERTICULOSIS, COLON 08/04/2007  . GERD 06/23/2007  . HYPERLIPIDEMIA 06/24/2007  . HYPERTENSION 06/23/2007  . Irritable bowel syndrome 06/23/2007  . LOW BACK PAIN 06/24/2007  . MELANOMA, HX OF 08/04/2007  . Memory dysfunction 06/23/2011    Past Surgical History:  Procedure Laterality Date  . APPENDECTOMY    . CESAREAN SECTION     x 2  . COLONOSCOPY      Social History   Social History  . Marital status: Married    Spouse name: [name]  . Number of children: [name]  . Years of education: [name]   Occupational History  . Not on file.   Social History Main Topics  . Smoking status: Never Smoker  . Smokeless tobacco: Never Used  . Alcohol use 25.2 oz/week    42 Glasses of wine per week  . Drug use: No  . Sexual activity: Yes    Birth control/ protection: Post-menopausal   Other Topics Concern  . Not on file   Social History Narrative  . No narrative on file   Family History  Problem Relation Age of Onset  . Colon cancer Cousin   . Hypertension Other   . Diabetes Other       VITAL SIGNS BP 110/74   Pulse 77   Temp 97.4 F (36.3 C)   Resp 18   Ht 5\' 5"  (1.651 m)   Wt 108 lb 3.2 oz (49.1 kg)   SpO2 97%    BMI 18.01 kg/m   Patient's Medications  New Prescriptions   No medications on file  Previous Medications   ACETAMINOPHEN (TYLENOL) 500 MG TABLET    Take 500 mg by mouth every 4 (four) hours as needed for mild pain.   ALUM & MAG HYDROXIDE-SIMETH (MINTOX) [id number] MG/5ML SUSPENSION    Take 30 mLs by mouth every 6 (six) hours as needed for indigestion or heartburn.   DIVALPROEX (DEPAKOTE SPRINKLE) 125 MG CAPSULE    Take 250 mg by mouth 3 (three) times daily.    ESCITALOPRAM (LEXAPRO) 10 MG TABLET    Take 10 mg by mouth daily.   FERROUS SULFATE 325 (65 FE) MG TABLET    Take 1 tablet (325 mg total) by mouth 3 (three) times daily after meals.   FOLIC ACID (FOLVITE) 1 MG TABLET    Take 1 tablet (1 mg total) by mouth daily.   GUAIFENESIN (ROBITUSSIN) 100 MG/5ML SYRUP    Take 200 mg by mouth 3 (three) times daily as needed for cough.   LANOLIN OINTMENT    Apply 1 application topically as needed for dry skin.  LORAZEPAM (ATIVAN) 0.5 MG TABLET    Take 1 tablet (0.5 mg total) by mouth every 8 (eight) hours as needed for anxiety.   MAGNESIUM HYDROXIDE (MILK OF MAGNESIA) 400 MG/5ML SUSPENSION    Take 30 mLs by mouth daily as needed for mild constipation.   MEMANTINE HCL-DONEPEZIL HCL (NAMZARIC) 28-10 MG CP24    Take 1 capsule by mouth every morning.   MULTIPLE VITAMIN (MULTIVITAMIN WITH MINERALS) TABS TABLET    Take 1 tablet by mouth daily.   PROBIOTIC PRODUCT (PROBIOTIC DAILY) CAPS    Take 1 capsule by mouth 2 (two) times daily.   QUETIAPINE (SEROQUEL) 25 MG TABLET    Take 0.5 tablets (12.5 mg total) by mouth 2 (two) times daily.   THIAMINE 100 MG TABLET    Take 1 tablet (100 mg total) by mouth daily.   TRAMADOL (ULTRAM) 50 MG TABLET    Take one tablet by mouth every 8 hours for pain  Modified Medications   No medications on file  Discontinued Medications   No medications on file     SIGNIFICANT DIAGNOSTIC EXAMS   07-31-16: 1.  No acute intracranial abnormality. 2.  Cerebral atrophy and  small vessel ischemic change  08-03-16: ct of abdomen and pelvis: 1. Diffuse bladder wall thickening which may reflect cystitis. 2. Moderate to severe right and moderate left hydroureteronephrosis. No obstructing calculi. This may be secondary to #1. 3. Mild T12 and L2 compression fractures, new from 2014 though of indeterminate acuity. 4. **An incidental finding of potential clinical significance has been found. 4.6 cm left adnexal mass. Nonurgent evaluation with pelvic ultrasound is recommended.** 5. Aortic atherosclerosis.  08-21-16: left hip x-ray: Acute displaced LEFT femur intertrochanteric fracture without dislocation. Old nonunited RIGHT femoral neck fracture. Old bilateral pelvic Fractures.  08-22-16: renal ultrasound: Limited exam. Moderate bilateral hydronephrosis. Nonspecific circumferential wall thickening of the urinary bladder.  08-23-16: renal ultrasound: Significant decrease in hydronephrosis since previous study with mild residual hydronephrosis on the right and no significant hydronephrosis on the left. Bladder is decompressed with a Foley catheter  08-24-16: ct of abdomen and pelvis: IMPRESSION: 1. No hematoma is identified. 2. Trace bilateral pleural effusions. 3. Gallbladder sludge or small stones. No evidence for cholecystitis. 4. Mild persistent hydronephrosis stable from prior renal ultrasound given differences in technique. 5. Chronic right and acute left proximal femoral fractures. Fat stranding and mild swelling of left upper thigh muscles is probably reactive edema. No discrete hematoma identified. 6. Stable T12 and L2 mild compression deformities. 7. **An incidental finding of potential clinical significance has been found. Left adnexal mass measuring up to 4.6 cm, further characterization with ultrasound is recommended. T    LABS REVIEWED:   08-10-16: wbc 6.1; hgb 11.0; hct 34.0; mcv 97.1; plt 163; glucose 78; bun 21.9; creat 1.22; k+ 3.5; na++ 142    08-12-16: wbc 8.6; hgb 11.3; hct 34.9; mcv 97.7; plt 166; glucose 73; bun 30.5; creat 1.35; k+ 3.4 na++ 143;  08-21-16; wbc 16.0; hgb 10.7; hct 33.6; mcv 97.1; plt 144; glucose 125; bun 90; bun 2.96; k+ 3.4; na++ 147; urine culture: yeast: diflucan  08-24-16: wbc 6.4; hgb 7.7; hct 24.7;mcv 102.1; plt 130; glucose 303; bun 46; creat 1.43; k+ 4.0; na++ 144; vit B 12: 458; folate 33.6; iron 49; TIBC 140; ferritin 450  08-26-16: wbc 10.0; hgb 8.9; hct 28.0; mcv 98.9; plt 177; glucose 111; bun 27; creat 1.08; k+ 3.6; na++ 140  08-31-16: wbc 8.6; hgb 9.4; hct 28.3; mcv 98.4; plt  260; glucose 141; bun 16.0; creat 0.94; k+ 3.9; na++ 140     Review of Systems  Unable to perform ROS: Dementia    Physical Exam  Constitutional: No distress.  Frail   Eyes: Conjunctivae are normal.  Neck: Neck supple. No JVD present. No thyromegaly present.  Cardiovascular: Normal rate, regular rhythm and intact distal pulses.   Respiratory: Effort normal and breath sounds normal. No respiratory distress. She has no wheezes.  GI: Soft. Bowel sounds are normal. She exhibits no distension. There is no tenderness.  Musculoskeletal: has left lower extremity edema .  Did not voluntarily more lower extremities   Lymphadenopathy:    She has no cervical adenopathy.  Neurological: She is alert.  Skin: Skin is warm and dry. She is not diaphoretic.  Psychiatric: She has a normal mood and affect.     ASSESSMENT/ PLAN:  1. Left  femur intertrochanteric fracture: she is being treated conservatively. She will follow up with orthopedics as indicated and will be seen by therapy as directed. Will continue ultram 50 mg every 8 hours    2.  CKD stage II bun/creat 27/1.08 will monitor  3. Urine retention: has bilateral hydronephrosis: with possible high pressure bladder requires foley: will follow up with urology will monitor  4. Anemia: hgb 9.4; will continue iron three times daily   5. Hypertension: will continue norvasc 5  mg daily  6. Dementia: is related to her alcohol abuse  will continue namzaric 28-10 mg daily take thiamine [id number] mg daily and folic acid 1 mg daily  7. Anxiety with depression:  Has mood disorder: will continue lexapro 10 mg daily depakote 250 mg three times daily seroquel 12.5 mg nightly and has ativan 0.5 mg every 8 hours as needed  8. Severe protein calorie malnutrition: will continue supplements per facility protocol and will continue prostat 30 cc twice daily  Her weight is 103 pounds.  Albumin 1.9 (08-23-16)  9. Left lower extremity edema: will get doppler study to rule out dvt. Will monitor   10. CKD stage II: bun 16; creat 0.94    MD is aware of resident's narcotic use and is in agreement with current plan of care. We will attempt to wean resident as apropriate   Ok Edwards NP [facility]  Contact [phone] Monday through Friday 8am- 5pm  After hours call [phone]

## 2016-12-10 ENCOUNTER — Emergency Department (HOSPITAL_COMMUNITY): Payer: Medicare Other

## 2016-12-10 ENCOUNTER — Encounter (HOSPITAL_COMMUNITY): Payer: Self-pay

## 2016-12-10 ENCOUNTER — Inpatient Hospital Stay (HOSPITAL_COMMUNITY): Payer: Medicare Other

## 2016-12-10 ENCOUNTER — Inpatient Hospital Stay (HOSPITAL_COMMUNITY)
Admission: EM | Admit: 2016-12-10 | Discharge: 2016-12-15 | DRG: 871 | Disposition: A | Discharge status: Hospice | Payer: Medicare Other | Attending: Internal Medicine | Admitting: Internal Medicine

## 2016-12-10 DIAGNOSIS — Z515 Encounter for palliative care: Secondary | ICD-10-CM | POA: Diagnosis not present

## 2016-12-10 DIAGNOSIS — Z800 Family history of malignant neoplasm of digestive organs: Secondary | ICD-10-CM

## 2016-12-10 DIAGNOSIS — I82519 Chronic embolism and thrombosis of unspecified femoral vein: Secondary | ICD-10-CM | POA: Diagnosis present

## 2016-12-10 DIAGNOSIS — N9489 Other specified conditions associated with female genital organs and menstrual cycle: Secondary | ICD-10-CM

## 2016-12-10 DIAGNOSIS — Z8249 Family history of ischemic heart disease and other diseases of the circulatory system: Secondary | ICD-10-CM

## 2016-12-10 DIAGNOSIS — Z7189 Other specified counseling: Secondary | ICD-10-CM | POA: Diagnosis not present

## 2016-12-10 DIAGNOSIS — F418 Other specified anxiety disorders: Secondary | ICD-10-CM | POA: Diagnosis present

## 2016-12-10 DIAGNOSIS — E870 Hyperosmolality and hypernatremia: Secondary | ICD-10-CM | POA: Diagnosis not present

## 2016-12-10 DIAGNOSIS — N839 Noninflammatory disorder of ovary, fallopian tube and broad ligament, unspecified: Secondary | ICD-10-CM | POA: Diagnosis present

## 2016-12-10 DIAGNOSIS — Z681 Body mass index (BMI) 19 or less, adult: Secondary | ICD-10-CM

## 2016-12-10 DIAGNOSIS — N390 Urinary tract infection, site not specified: Secondary | ICD-10-CM | POA: Diagnosis present

## 2016-12-10 DIAGNOSIS — F0150 Vascular dementia without behavioral disturbance: Secondary | ICD-10-CM | POA: Diagnosis not present

## 2016-12-10 DIAGNOSIS — F0390 Unspecified dementia without behavioral disturbance: Secondary | ICD-10-CM | POA: Diagnosis not present

## 2016-12-10 DIAGNOSIS — S72002S Fracture of unspecified part of neck of left femur, sequela: Secondary | ICD-10-CM | POA: Diagnosis not present

## 2016-12-10 DIAGNOSIS — M4628 Osteomyelitis of vertebra, sacral and sacrococcygeal region: Secondary | ICD-10-CM | POA: Diagnosis present

## 2016-12-10 DIAGNOSIS — Z85820 Personal history of malignant melanoma of skin: Secondary | ICD-10-CM

## 2016-12-10 DIAGNOSIS — L89890 Pressure ulcer of other site, unstageable: Secondary | ICD-10-CM | POA: Diagnosis present

## 2016-12-10 DIAGNOSIS — R32 Unspecified urinary incontinence: Secondary | ICD-10-CM | POA: Diagnosis present

## 2016-12-10 DIAGNOSIS — L89619 Pressure ulcer of right heel, unspecified stage: Secondary | ICD-10-CM | POA: Diagnosis not present

## 2016-12-10 DIAGNOSIS — E872 Acidosis: Secondary | ICD-10-CM | POA: Diagnosis present

## 2016-12-10 DIAGNOSIS — B965 Pseudomonas (aeruginosa) (mallei) (pseudomallei) as the cause of diseases classified elsewhere: Secondary | ICD-10-CM | POA: Diagnosis present

## 2016-12-10 DIAGNOSIS — F0391 Unspecified dementia with behavioral disturbance: Secondary | ICD-10-CM | POA: Diagnosis present

## 2016-12-10 DIAGNOSIS — Z885 Allergy status to narcotic agent status: Secondary | ICD-10-CM | POA: Diagnosis not present

## 2016-12-10 DIAGNOSIS — R7881 Bacteremia: Secondary | ICD-10-CM | POA: Diagnosis not present

## 2016-12-10 DIAGNOSIS — G9340 Encephalopathy, unspecified: Secondary | ICD-10-CM | POA: Diagnosis present

## 2016-12-10 DIAGNOSIS — J9601 Acute respiratory failure with hypoxia: Secondary | ICD-10-CM | POA: Diagnosis present

## 2016-12-10 DIAGNOSIS — N179 Acute kidney failure, unspecified: Secondary | ICD-10-CM | POA: Diagnosis present

## 2016-12-10 DIAGNOSIS — A411 Sepsis due to other specified staphylococcus: Principal | ICD-10-CM | POA: Diagnosis present

## 2016-12-10 DIAGNOSIS — F03918 Unspecified dementia, unspecified severity, with other behavioral disturbance: Secondary | ICD-10-CM | POA: Diagnosis present

## 2016-12-10 DIAGNOSIS — L89154 Pressure ulcer of sacral region, stage 4: Secondary | ICD-10-CM | POA: Diagnosis present

## 2016-12-10 DIAGNOSIS — R159 Full incontinence of feces: Secondary | ICD-10-CM | POA: Diagnosis present

## 2016-12-10 DIAGNOSIS — A419 Sepsis, unspecified organism: Secondary | ICD-10-CM | POA: Diagnosis not present

## 2016-12-10 DIAGNOSIS — N182 Chronic kidney disease, stage 2 (mild): Secondary | ICD-10-CM | POA: Diagnosis present

## 2016-12-10 DIAGNOSIS — L89504 Pressure ulcer of unspecified ankle, stage 4: Secondary | ICD-10-CM

## 2016-12-10 DIAGNOSIS — K219 Gastro-esophageal reflux disease without esophagitis: Secondary | ICD-10-CM | POA: Diagnosis present

## 2016-12-10 DIAGNOSIS — R0603 Acute respiratory distress: Secondary | ICD-10-CM | POA: Diagnosis present

## 2016-12-10 DIAGNOSIS — R64 Cachexia: Secondary | ICD-10-CM | POA: Diagnosis present

## 2016-12-10 DIAGNOSIS — E43 Unspecified severe protein-calorie malnutrition: Secondary | ICD-10-CM | POA: Diagnosis present

## 2016-12-10 DIAGNOSIS — I129 Hypertensive chronic kidney disease with stage 1 through stage 4 chronic kidney disease, or unspecified chronic kidney disease: Secondary | ICD-10-CM | POA: Diagnosis present

## 2016-12-10 DIAGNOSIS — E785 Hyperlipidemia, unspecified: Secondary | ICD-10-CM | POA: Diagnosis present

## 2016-12-10 DIAGNOSIS — R8271 Bacteriuria: Secondary | ICD-10-CM | POA: Diagnosis not present

## 2016-12-10 DIAGNOSIS — S72001S Fracture of unspecified part of neck of right femur, sequela: Secondary | ICD-10-CM | POA: Diagnosis not present

## 2016-12-10 DIAGNOSIS — M545 Low back pain: Secondary | ICD-10-CM | POA: Diagnosis present

## 2016-12-10 DIAGNOSIS — I9589 Other hypotension: Secondary | ICD-10-CM

## 2016-12-10 DIAGNOSIS — Z833 Family history of diabetes mellitus: Secondary | ICD-10-CM

## 2016-12-10 DIAGNOSIS — Z7401 Bed confinement status: Secondary | ICD-10-CM

## 2016-12-10 DIAGNOSIS — Z9181 History of falling: Secondary | ICD-10-CM

## 2016-12-10 DIAGNOSIS — D638 Anemia in other chronic diseases classified elsewhere: Secondary | ICD-10-CM | POA: Diagnosis present

## 2016-12-10 DIAGNOSIS — Z7901 Long term (current) use of anticoagulants: Secondary | ICD-10-CM

## 2016-12-10 DIAGNOSIS — Z66 Do not resuscitate: Secondary | ICD-10-CM | POA: Diagnosis not present

## 2016-12-10 DIAGNOSIS — Z86010 Personal history of colonic polyps: Secondary | ICD-10-CM

## 2016-12-10 DIAGNOSIS — E876 Hypokalemia: Secondary | ICD-10-CM | POA: Diagnosis present

## 2016-12-10 DIAGNOSIS — R1909 Other intra-abdominal and pelvic swelling, mass and lump: Secondary | ICD-10-CM | POA: Diagnosis not present

## 2016-12-10 DIAGNOSIS — R403 Persistent vegetative state: Secondary | ICD-10-CM

## 2016-12-10 DIAGNOSIS — S72142A Displaced intertrochanteric fracture of left femur, initial encounter for closed fracture: Secondary | ICD-10-CM | POA: Diagnosis present

## 2016-12-10 DIAGNOSIS — X58XXXS Exposure to other specified factors, sequela: Secondary | ICD-10-CM | POA: Diagnosis not present

## 2016-12-10 DIAGNOSIS — J9600 Acute respiratory failure, unspecified whether with hypoxia or hypercapnia: Secondary | ICD-10-CM

## 2016-12-10 DIAGNOSIS — L89629 Pressure ulcer of left heel, unspecified stage: Secondary | ICD-10-CM | POA: Diagnosis not present

## 2016-12-10 DIAGNOSIS — Z79899 Other long term (current) drug therapy: Secondary | ICD-10-CM

## 2016-12-10 DIAGNOSIS — R1310 Dysphagia, unspecified: Secondary | ICD-10-CM | POA: Diagnosis present

## 2016-12-10 DIAGNOSIS — K589 Irritable bowel syndrome without diarrhea: Secondary | ICD-10-CM | POA: Diagnosis present

## 2016-12-10 DIAGNOSIS — L8990 Pressure ulcer of unspecified site, unspecified stage: Secondary | ICD-10-CM | POA: Diagnosis present

## 2016-12-10 DIAGNOSIS — I10 Essential (primary) hypertension: Secondary | ICD-10-CM | POA: Diagnosis present

## 2016-12-10 HISTORY — DX: Chronic kidney disease, stage 2 (mild): N18.2

## 2016-12-10 HISTORY — DX: Other specified conditions associated with female genital organs and menstrual cycle: N94.89

## 2016-12-10 LAB — I-STAT ARTERIAL BLOOD GAS, ED
Bicarbonate: 24 mmol/L (ref 20.0–28.0)
O2 SAT: 97 %
TCO2: 25 mmol/L (ref 0–100)
pCO2 arterial: 39.5 mmHg (ref 32.0–48.0)
pH, Arterial: 7.4 (ref 7.350–7.450)
pO2, Arterial: 98 mmHg (ref 83.0–108.0)

## 2016-12-10 LAB — MRSA PCR SCREENING: MRSA by PCR: NEGATIVE

## 2016-12-10 LAB — COMPREHENSIVE METABOLIC PANEL
ALBUMIN: 2.7 g/dL — AB (ref 3.5–5.0)
ALT: 20 U/L (ref 14–54)
ANION GAP: 27 — AB (ref 5–15)
AST: 38 U/L (ref 15–41)
Alkaline Phosphatase: 68 U/L (ref 38–126)
BUN: 213 mg/dL — AB (ref 6–20)
CHLORIDE: 126 mmol/L — AB (ref 101–111)
CO2: 26 mmol/L (ref 22–32)
Calcium: 8.2 mg/dL — ABNORMAL LOW (ref 8.9–10.3)
Creatinine, Ser: 4.86 mg/dL — ABNORMAL HIGH (ref 0.44–1.00)
GFR calc Af Amer: 10 mL/min — ABNORMAL LOW (ref >60)
GFR calc non Af Amer: 8 mL/min — ABNORMAL LOW (ref >60)
GLUCOSE: 138 mg/dL — AB (ref 65–99)
POTASSIUM: 3.8 mmol/L (ref 3.5–5.1)
SODIUM: 179 mmol/L — AB (ref 135–145)
TOTAL PROTEIN: 7.3 g/dL (ref 6.5–8.1)
Total Bilirubin: 0.9 mg/dL (ref 0.3–1.2)

## 2016-12-10 LAB — CBC WITH DIFFERENTIAL/PLATELET
BASOS PCT: 1 %
Basophils Absolute: 0.1 10*3/uL (ref 0.0–0.1)
EOS ABS: 0 10*3/uL (ref 0.0–0.7)
Eosinophils Relative: 0 %
HCT: 44.7 % (ref 36.0–46.0)
Hemoglobin: 12.5 g/dL (ref 12.0–15.0)
LYMPHS ABS: 1.4 10*3/uL (ref 0.7–4.0)
Lymphocytes Relative: 7 %
MCH: 28 pg (ref 26.0–34.0)
MCHC: 28 g/dL — AB (ref 30.0–36.0)
MCV: 100 fL (ref 78.0–100.0)
MONOS PCT: 8 %
Monocytes Absolute: 1.5 10*3/uL — ABNORMAL HIGH (ref 0.1–1.0)
NEUTROS ABS: 16.9 10*3/uL — AB (ref 1.7–7.7)
NEUTROS PCT: 85 %
Platelets: 219 10*3/uL (ref 150–400)
RBC: 4.47 MIL/uL (ref 3.87–5.11)
RDW: 17.4 % — ABNORMAL HIGH (ref 11.5–15.5)
WBC: 19.9 10*3/uL — AB (ref 4.0–10.5)

## 2016-12-10 LAB — LIPASE, BLOOD: LIPASE: 55 U/L — AB (ref 11–51)

## 2016-12-10 LAB — BASIC METABOLIC PANEL
BUN: 196 mg/dL — ABNORMAL HIGH (ref 6–20)
BUN: 188 mg/dL — AB (ref 6–20)
CALCIUM: 7.5 mg/dL — AB (ref 8.9–10.3)
CALCIUM: 7.4 mg/dL — AB (ref 8.9–10.3)
CO2: 25 mmol/L (ref 22–32)
CO2: 24 mmol/L (ref 22–32)
CREATININE: 4.34 mg/dL — AB (ref 0.44–1.00)
CREATININE: 4.2 mg/dL — AB (ref 0.44–1.00)
Chloride: >130 mmol/L (ref 101–111)
Chloride: >130 mmol/L (ref 101–111)
GFR calc Af Amer: 11 mL/min — ABNORMAL LOW (ref >60)
GFR calc non Af Amer: 10 mL/min — ABNORMAL LOW (ref >60)
GFR calc non Af Amer: 10 mL/min — ABNORMAL LOW (ref >60)
GFR, EST AFRICAN AMERICAN: 11 mL/min — AB (ref >60)
GLUCOSE: 158 mg/dL — AB (ref 65–99)
GLUCOSE: 297 mg/dL — AB (ref 65–99)
Potassium: 2.8 mmol/L — ABNORMAL LOW (ref 3.5–5.1)
Potassium: 3.6 mmol/L (ref 3.5–5.1)
Sodium: 175 mmol/L (ref 135–145)
Sodium: 172 mmol/L (ref 135–145)

## 2016-12-10 LAB — PROCALCITONIN: Procalcitonin: 1.45 ng/mL

## 2016-12-10 LAB — APTT: aPTT: 26 seconds (ref 24–36)

## 2016-12-10 LAB — OSMOLALITY, URINE: Osmolality, Ur: 439 mOsm/kg (ref 300–900)

## 2016-12-10 LAB — CREATININE, SERUM
Creatinine, Ser: 4.41 mg/dL — ABNORMAL HIGH (ref 0.44–1.00)
GFR calc Af Amer: 11 mL/min — ABNORMAL LOW (ref >60)
GFR, EST NON AFRICAN AMERICAN: 9 mL/min — AB (ref >60)

## 2016-12-10 LAB — MAGNESIUM: Magnesium: 2.9 mg/dL — ABNORMAL HIGH (ref 1.7–2.4)

## 2016-12-10 LAB — URINALYSIS, ROUTINE W REFLEX MICROSCOPIC
Bilirubin Urine: NEGATIVE
GLUCOSE, UA: 50 mg/dL — AB
Ketones, ur: NEGATIVE mg/dL
NITRITE: NEGATIVE
PH: 5 (ref 5.0–8.0)
Protein, ur: 100 mg/dL — AB
Specific Gravity, Urine: 1.019 (ref 1.005–1.030)

## 2016-12-10 LAB — VALPROIC ACID LEVEL

## 2016-12-10 LAB — LACTIC ACID, PLASMA: Lactic Acid, Venous: 1.7 mmol/L (ref 0.5–1.9)

## 2016-12-10 LAB — PROTIME-INR
INR: 1.35
PROTHROMBIN TIME: 16.7 s — AB (ref 11.4–15.2)

## 2016-12-10 LAB — AMMONIA: Ammonia: 26 umol/L (ref 9–35)

## 2016-12-10 LAB — I-STAT CG4 LACTIC ACID, ED
Lactic Acid, Venous: 3.84 mmol/L (ref 0.5–1.9)
Lactic Acid, Venous: 1.53 mmol/L (ref 0.5–1.9)

## 2016-12-10 LAB — VITAMIN B12: VITAMIN B 12: 820 pg/mL (ref 180–914)

## 2016-12-10 LAB — PHOSPHORUS: Phosphorus: 5.2 mg/dL — ABNORMAL HIGH (ref 2.5–4.6)

## 2016-12-10 LAB — OSMOLALITY: Osmolality: 453 mOsm/kg (ref 275–295)

## 2016-12-10 LAB — SODIUM, URINE, RANDOM: Sodium, Ur: 34 mmol/L

## 2016-12-10 MED ORDER — PIPERACILLIN-TAZOBACTAM 3.375 G IVPB 30 MIN
3.3750 g | Freq: Once | INTRAVENOUS | Status: AC
  Administered 2016-12-10: 3.375 g via INTRAVENOUS
  Filled 2016-12-10: qty 50

## 2016-12-10 MED ORDER — DIVALPROEX SODIUM 125 MG PO CSDR
250.0000 mg | DELAYED_RELEASE_CAPSULE | Freq: Three times a day (TID) | ORAL | Status: DC
  Filled 2016-12-10 (×4): qty 2

## 2016-12-10 MED ORDER — ONDANSETRON HCL 4 MG/2ML IJ SOLN: 4.0000 mg | Freq: Four times a day (QID) | INTRAMUSCULAR | Status: DC | PRN

## 2016-12-10 MED ORDER — ACETAMINOPHEN 650 MG RE SUPP: 650.0000 mg | Freq: Four times a day (QID) | RECTAL | Status: DC | PRN

## 2016-12-10 MED ORDER — ACETAMINOPHEN 325 MG PO TABS
650.0000 mg | ORAL_TABLET | Freq: Four times a day (QID) | ORAL | Status: DC | PRN
  Administered 2016-12-11 – 2016-12-12 (×2): 650 mg via ORAL
  Filled 2016-12-10 (×2): qty 2

## 2016-12-10 MED ORDER — ONDANSETRON HCL 4 MG PO TABS: 4.0000 mg | ORAL_TABLET | Freq: Four times a day (QID) | ORAL | Status: DC | PRN

## 2016-12-10 MED ORDER — PIPERACILLIN-TAZOBACTAM IN DEX 2-0.25 GM/50ML IV SOLN
2.2500 g | Freq: Four times a day (QID) | INTRAVENOUS | Status: DC
  Administered 2016-12-10 – 2016-12-12 (×7): 2.25 g via INTRAVENOUS
  Filled 2016-12-10 (×11): qty 50

## 2016-12-10 MED ORDER — ACETAMINOPHEN 650 MG RE SUPP
650.0000 mg | RECTAL | Status: DC | PRN
  Administered 2016-12-10: 650 mg via RECTAL
  Filled 2016-12-10: qty 1

## 2016-12-10 MED ORDER — HEPARIN SODIUM (PORCINE) 5000 UNIT/ML IJ SOLN
5000.0000 [IU] | Freq: Three times a day (TID) | INTRAMUSCULAR | Status: DC
  Administered 2016-12-10: 5000 [IU] via SUBCUTANEOUS
  Filled 2016-12-10: qty 1

## 2016-12-10 MED ORDER — POTASSIUM CHLORIDE CRYS ER 20 MEQ PO TBCR: 40.0000 meq | EXTENDED_RELEASE_TABLET | Freq: Once | ORAL | Status: DC

## 2016-12-10 MED ORDER — SODIUM CHLORIDE 4 MEQ/ML IV SOLN
INTRAVENOUS | Status: DC
  Administered 2016-12-10: 12:00:00 via INTRAVENOUS
  Filled 2016-12-10 (×2): qty 1000

## 2016-12-10 MED ORDER — SODIUM CHLORIDE 0.9 % IV SOLN
INTRAVENOUS | Status: DC
Start: 2016-12-10 — End: 2016-12-10

## 2016-12-10 MED ORDER — SODIUM CHLORIDE 0.9 % IV BOLUS (SEPSIS)
500.0000 mL | Freq: Once | INTRAVENOUS | Status: AC
  Administered 2016-12-10: 500 mL via INTRAVENOUS

## 2016-12-10 MED ORDER — SODIUM CHLORIDE 0.45 % IV SOLN: INTRAVENOUS | Status: DC

## 2016-12-10 MED ORDER — VANCOMYCIN HCL IN DEXTROSE 1-5 GM/200ML-% IV SOLN
1000.0000 mg | Freq: Once | INTRAVENOUS | Status: AC
  Administered 2016-12-10: 1000 mg via INTRAVENOUS
  Filled 2016-12-10: qty 200

## 2016-12-10 MED ORDER — SODIUM CHLORIDE 0.9 % IV SOLN
30.0000 meq | Freq: Once | INTRAVENOUS | Status: AC
  Administered 2016-12-10: 30 meq via INTRAVENOUS
  Filled 2016-12-10: qty 15

## 2016-12-10 MED ORDER — LORAZEPAM 2 MG/ML IJ SOLN
2.0000 mg | Freq: Four times a day (QID) | INTRAMUSCULAR | Status: DC | PRN
Start: 2016-12-10 — End: 2016-12-11

## 2016-12-10 MED ORDER — SODIUM CHLORIDE 0.9 % IV BOLUS (SEPSIS)
1000.0000 mL | Freq: Once | INTRAVENOUS | Status: AC
  Administered 2016-12-10: 1000 mL via INTRAVENOUS

## 2016-12-10 MED ORDER — ALBUTEROL SULFATE (2.5 MG/3ML) 0.083% IN NEBU
2.5000 mg | INHALATION_SOLUTION | Freq: Four times a day (QID) | RESPIRATORY_TRACT | Status: AC
  Administered 2016-12-10: 2.5 mg via RESPIRATORY_TRACT
  Filled 2016-12-10: qty 3

## 2016-12-10 MED ORDER — DEXTROSE 10 % IV SOLN
INTRAVENOUS | Status: DC
  Administered 2016-12-10: 500 mL via INTRAVENOUS

## 2016-12-10 MED ORDER — HEPARIN (PORCINE) IN NACL 100-0.45 UNIT/ML-% IJ SOLN
700.0000 [IU]/h | INTRAMUSCULAR | Status: DC
Start: 2016-12-11 — End: 2016-12-15
  Administered 2016-12-11: 600 [IU]/h via INTRAVENOUS
  Administered 2016-12-12 – 2016-12-14 (×2): 700 [IU]/h via INTRAVENOUS
  Filled 2016-12-10 (×3): qty 250

## 2016-12-10 MED ORDER — VANCOMYCIN HCL 500 MG IV SOLR
500.0000 mg | INTRAVENOUS | Status: DC
  Administered 2016-12-12 – 2016-12-14 (×2): 500 mg via INTRAVENOUS
  Filled 2016-12-10 (×2): qty 500

## 2016-12-10 MED ORDER — SODIUM CHLORIDE 0.9% FLUSH
3.0000 mL | Freq: Two times a day (BID) | INTRAVENOUS | Status: DC
  Administered 2016-12-10 – 2016-12-15 (×6): 3 mL via INTRAVENOUS

## 2016-12-10 MED ORDER — QUETIAPINE 12.5 MG HALF TABLET
12.5000 mg | ORAL_TABLET | Freq: Two times a day (BID) | ORAL | Status: DC
  Filled 2016-12-10: qty 1

## 2016-12-10 MED ORDER — DAKINS (1/4 STRENGTH) 0.125 % EX SOLN
Freq: Two times a day (BID) | CUTANEOUS | Status: AC
  Administered 2016-12-10 – 2016-12-12 (×5)
  Filled 2016-12-10: qty 473

## 2016-12-10 NOTE — Progress Notes (Signed)
Dr. Aggie Moats text page for critical serum Osmolality at [phone].

## 2016-12-10 NOTE — Progress Notes (Signed)
Pharmacy Antibiotic Note  Tara Shaffer is a 71 y.o. female admitted on 12/10/2016 with sepsis.  Pharmacy has been consulted for vancomycin and zosyn dosing.  Patient received vancomycin 1g and zosyn 3.375g IV once in the ED.  Plan: Vancomycin 500mg  IV every 48 hours.  Goal trough 15-20 mcg/mL.  Zosyn 2.25g IV q6h Monitor culture data, renal function and clinical course VT at SS prn  Height: 5\' 7"  (170.2 cm) Weight: 105 lb (47.6 kg) IBW/kg (Calculated) : 61.6  No data recorded.  No results for input(s): WBC, CREATININE, LATICACIDVEN, VANCOTROUGH, VANCOPEAK, VANCORANDOM, GENTTROUGH, GENTPEAK, GENTRANDOM, TOBRATROUGH, TOBRAPEAK, TOBRARND, AMIKACINPEAK, AMIKACINTROU, AMIKACIN in the last 168 hours.  CrCl cannot be calculated (Patient's most recent lab result is older than the maximum 21 days allowed.).    Allergies  Allergen Reactions  . Codeine Phosphate Nausea Only     Andrey Cota. Diona Foley, PharmD, BCPS Clinical Pharmacist [phone] 12/10/2016 7:39 AM

## 2016-12-10 NOTE — ED Notes (Signed)
Pt's urine sample sent off noted dark cloudy urine in and out cath procedure done pt tolerated well

## 2016-12-10 NOTE — ED Triage Notes (Signed)
Pt arrives unresponsive with increased respirations ER MD to bedside

## 2016-12-10 NOTE — Consult Note (Signed)
Reason for Consult:Stage 4 sacral decubitus Referring Physician: Dr. Rudean Hitt Tara Shaffer is an 71 y.o. female.  HPI: The patient is a 71 year old female who was admitted secondary to what appears to be significant dementia, behavioral disturbances, likely sepsis. Patient has a history of hypertension stage IV decubitus, anxiety, alcohol abuse, recent DVT.  Patient was evaluated by wound care was found to have stage IV decubitus ulcer. In reviewing records it appears that This started out as a stage II was documented in the records approximately 2 years ago.  Secondary to patient's sepsis and wound nurse consult general surgery was consulted for further evaluation and management.  Past Medical History:  Diagnosis Date  . Adnexal mass   . Alcohol abuse 02/27/2012  . ALLERGIC RHINITIS 06/23/2007  . ANXIETY 06/23/2007  . ANXIETY DEPRESSION 06/12/2009  . Cancer ([place])    skin cx/ hx melanoma  . COLONIC POLYPS, HX OF 03/08/2002  . 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 Dementia   . DIVERTICULOSIS, COLON 08/04/2007  . GERD 06/23/2007  . HYPERLIPIDEMIA 06/24/2007  . HYPERTENSION 06/23/2007  . Irritable bowel syndrome 06/23/2007  . LOW BACK PAIN 06/24/2007  . MELANOMA, HX OF 08/04/2007  . Memory dysfunction 06/23/2011    Past Surgical History:  Procedure Laterality Date  . APPENDECTOMY    . CESAREAN SECTION     x 2  . COLONOSCOPY      Family History  Problem Relation Age of Onset  . Colon cancer Cousin   . Hypertension Other   . Diabetes Other     Social History:  reports that she has never smoked. She has never used smokeless tobacco. She reports that she drinks about 25.2 oz of alcohol per week . She reports that she does not use drugs.  Allergies:  Allergies  Allergen Reactions  . Codeine Phosphate Nausea Only    Medications: I have reviewed the patient's current medications.  Results for orders placed or performed during the hospital encounter of 12/10/16  (from the past 48 hour(s))  Comprehensive metabolic panel     Status: Abnormal   Collection Time: 12/10/16  7:30 AM  Result Value Ref Range   Sodium 179 (HH) 135 - 145 mmol/L    Comment: CRITICAL RESULT CALLED TO, READ BACK BY AND VERIFIED WITH: Thurmond Butts [id number] [facility]    Potassium 3.8 3.5 - 5.1 mmol/L   Chloride 126 (H) 101 - 111 mmol/L   CO2 26 22 - 32 mmol/L   Glucose, Bld 138 (H) 65 - 99 mg/dL   BUN 213 (H) 6 - 20 mg/dL   Creatinine, Ser 4.86 (H) 0.44 - 1.00 mg/dL   Calcium 8.2 (L) 8.9 - 10.3 mg/dL   Total Protein 7.3 6.5 - 8.1 g/dL   Albumin 2.7 (L) 3.5 - 5.0 g/dL   AST 38 15 - 41 U/L   ALT 20 14 - 54 U/L   Alkaline Phosphatase 68 38 - 126 U/L   Total Bilirubin 0.9 0.3 - 1.2 mg/dL   GFR calc non Af Amer 8 (L) >60 mL/min   GFR calc Af Amer 10 (L) >60 mL/min    Comment: (NOTE) The eGFR has been calculated using the CKD EPI equation. This calculation has not been validated in all clinical situations. eGFR's persistently <60 mL/min signify possible Chronic Kidney Disease.    Anion gap 27 (H) 5 - 15  CBC with Differential     Status: Abnormal   Collection Time: 12/10/16  7:30  AM  Result Value Ref Range   WBC 19.9 (H) 4.0 - 10.5 K/uL   RBC 4.47 3.87 - 5.11 MIL/uL   Hemoglobin 12.5 12.0 - 15.0 g/dL   HCT 22.8 44.9 - 02.0 %   MCV 100.0 78.0 - 100.0 fL   MCH 28.0 26.0 - 34.0 pg   MCHC 28.0 (L) 30.0 - 36.0 g/dL   RDW 80.2 (H) 51.8 - 43.5 %   Platelets 219 150 - 400 K/uL    Comment: REPEATED TO VERIFY PLATELET COUNT CONFIRMED BY SMEAR    Neutrophils Relative % 85 %   Neutro Abs 16.9 (H) 1.7 - 7.7 K/uL   Lymphocytes Relative 7 %   Lymphs Abs 1.4 0.7 - 4.0 K/uL   Monocytes Relative 8 %   Monocytes Absolute 1.5 (H) 0.1 - 1.0 K/uL   Eosinophils Relative 0 %   Eosinophils Absolute 0.0 0.0 - 0.7 K/uL   Basophils Relative 1 %   Basophils Absolute 0.1 0.0 - 0.1 K/uL  Protime-INR     Status: Abnormal   Collection Time: 12/10/16  7:30 AM  Result Value Ref Range    Prothrombin Time 16.7 (H) 11.4 - 15.2 seconds   INR 1.35   Lipase, blood     Status: Abnormal   Collection Time: 12/10/16  7:30 AM  Result Value Ref Range   Lipase 55 (H) 11 - 51 U/L  I-Stat CG4 Lactic Acid, ED     Status: Abnormal   Collection Time: 12/10/16  7:43 AM  Result Value Ref Range   Lactic Acid, Venous 3.84 (HH) 0.5 - 1.9 mmol/L   Comment NOTIFIED PHYSICIAN   Valproic acid level     Status: Abnormal   Collection Time: 12/10/16  8:36 AM  Result Value Ref Range   Valproic Acid Lvl <10 (L) 50.0 - 100.0 ug/mL    Comment: RESULTS CONFIRMED BY MANUAL DILUTION  I-Stat Arterial Blood Gas, ED - (order at [facility] and [facility] only)     Status: None   Collection Time: 12/10/16  8:39 AM  Result Value Ref Range   pH, Arterial 7.400 7.350 - 7.450   pCO2 arterial 39.5 32.0 - 48.0 mmHg   pO2, Arterial 98.0 83.0 - 108.0 mmHg   Bicarbonate 24.0 20.0 - 28.0 mmol/L   TCO2 25 0 - 100 mmol/L   O2 Saturation 97.0 %   Patient temperature 102.5 F    Sample type ARTERIAL   Urinalysis, Routine w reflex microscopic     Status: Abnormal   Collection Time: 12/10/16  8:59 AM  Result Value Ref Range   Color, Urine AMBER (A) YELLOW    Comment: BIOCHEMICALS MAY BE AFFECTED BY COLOR   APPearance TURBID (A) CLEAR   Specific Gravity, Urine 1.019 1.005 - 1.030   pH 5.0 5.0 - 8.0   Glucose, UA 50 (A) NEGATIVE mg/dL   Hgb urine dipstick SMALL (A) NEGATIVE   Bilirubin Urine NEGATIVE NEGATIVE   Ketones, ur NEGATIVE NEGATIVE mg/dL   Protein, ur 917 (A) NEGATIVE mg/dL   Nitrite NEGATIVE NEGATIVE   Leukocytes, UA MODERATE (A) NEGATIVE   RBC / HPF TOO NUMEROUS TO COUNT 0 - 5 RBC/hpf   WBC, UA TOO NUMEROUS TO COUNT 0 - 5 WBC/hpf   Bacteria, UA MANY (A) NONE SEEN   Squamous Epithelial / LPF TOO NUMEROUS TO COUNT (A) NONE SEEN   WBC Clumps PRESENT    Mucous PRESENT    Non Squamous Epithelial 0-5 (A) NONE SEEN  Sodium, urine,  random     Status: None   Collection Time: 12/10/16  9:52 AM  Result Value Ref Range    Sodium, Ur 34 mmol/L  Osmolality, urine     Status: None   Collection Time: 12/10/16  9:52 AM  Result Value Ref Range   Osmolality, Ur 439 300 - 900 mOsm/kg  Procalcitonin     Status: None   Collection Time: 12/10/16 10:05 AM  Result Value Ref Range   Procalcitonin 1.45 ng/mL    Comment:        Interpretation: PCT > 0.5 ng/mL and <= 2 ng/mL: Systemic infection (sepsis) is possible, but other conditions are known to elevate PCT as well. (NOTE)         ICU PCT Algorithm               Non ICU PCT Algorithm    ----------------------------     ------------------------------         PCT < 0.25 ng/mL                 PCT < 0.1 ng/mL     Stopping of antibiotics            Stopping of antibiotics       strongly encouraged.               strongly encouraged.    ----------------------------     ------------------------------       PCT level decrease by               PCT < 0.25 ng/mL       >= 80% from peak PCT       OR PCT 0.25 - 0.5 ng/mL          Stopping of antibiotics                                             encouraged.     Stopping of antibiotics           encouraged.    ----------------------------     ------------------------------       PCT level decrease by              PCT >= 0.25 ng/mL       < 80% from peak PCT        AND PCT >= 0.5 ng/mL             Continuing antibiotics                                              encouraged.       Continuing antibiotics            encouraged.    ----------------------------     ------------------------------     PCT level increase compared          PCT > 0.5 ng/mL         with peak PCT AND          PCT >= 0.5 ng/mL             Escalation of antibiotics  strongly encouraged.      Escalation of antibiotics        strongly encouraged.   Creatinine, serum     Status: Abnormal   Collection Time: 12/10/16 10:05 AM  Result Value Ref Range   Creatinine, Ser 4.41 (H) 0.44 - 1.00 mg/dL   GFR calc non  Af Amer 9 (L) >60 mL/min   GFR calc Af Amer 11 (L) >60 mL/min    Comment: (NOTE) The eGFR has been calculated using the CKD EPI equation. This calculation has not been validated in all clinical situations. eGFR's persistently <60 mL/min signify possible Chronic Kidney Disease.   Osmolality     Status: Abnormal   Collection Time: 12/10/16 10:05 AM  Result Value Ref Range   Osmolality 453 (HH) 275 - 295 mOsm/kg    Comment: REPEATED TO VERIFY CRITICAL RESULT CALLED TO, READ BACK BY AND VERIFIED WITH: [place] [id number] 12/10/2016 BY MACEDA, J   I-Stat CG4 Lactic Acid, ED  (not at  [facility])     Status: None   Collection Time: 12/10/16 10:18 AM  Result Value Ref Range   Lactic Acid, Venous 1.53 0.5 - 1.9 mmol/L  MRSA PCR Screening     Status: None   Collection Time: 12/10/16 11:39 AM  Result Value Ref Range   MRSA by PCR NEGATIVE NEGATIVE    Comment:        The GeneXpert MRSA Assay (FDA approved for NASAL specimens only), is one component of a comprehensive MRSA colonization surveillance program. It is not intended to diagnose MRSA infection nor to guide or monitor treatment for MRSA infections.   Lactic acid, plasma     Status: None   Collection Time: 12/10/16  1:32 PM  Result Value Ref Range   Lactic Acid, Venous 1.7 0.5 - 1.9 mmol/L  APTT     Status: None   Collection Time: 12/10/16  1:32 PM  Result Value Ref Range   aPTT 26 24 - 36 seconds  Vitamin B12     Status: None   Collection Time: 12/10/16  1:32 PM  Result Value Ref Range   Vitamin B-12 820 180 - 914 pg/mL    Comment: (NOTE) This assay is not validated for testing neonatal or myeloproliferative syndrome specimens for Vitamin B12 levels.   Magnesium     Status: Abnormal   Collection Time: 12/10/16  1:32 PM  Result Value Ref Range   Magnesium 2.9 (H) 1.7 - 2.4 mg/dL  Phosphorus     Status: Abnormal   Collection Time: 12/10/16  1:32 PM  Result Value Ref Range   Phosphorus 5.2 (H) 2.5 - 4.6 mg/dL  Basic  metabolic panel     Status: Abnormal   Collection Time: 12/10/16  4:08 PM  Result Value Ref Range   Sodium 175 (HH) 135 - 145 mmol/L    Comment: CRITICAL RESULT CALLED TO, READ BACK BY AND VERIFIED WITH: Buck Mam 1719 12/10/16 D BRADLEY    Potassium 2.8 (L) 3.5 - 5.1 mmol/L    Comment: DELTA CHECK NOTED   Chloride >130 (HH) 101 - 111 mmol/L    Comment: CRITICAL RESULT CALLED TO, READ BACK BY AND VERIFIED WITH: G WILLIAMS,RN 1719 12/10/16 D BRADLEY    CO2 25 22 - 32 mmol/L   Glucose, Bld 158 (H) 65 - 99 mg/dL   BUN 196 (H) 6 - 20 mg/dL   Creatinine, Ser 4.34 (H) 0.44 - 1.00 mg/dL   Calcium 7.5 (L) 8.9 - 10.3  mg/dL   GFR calc non Af Amer 10 (L) >60 mL/min   GFR calc Af Amer 11 (L) >60 mL/min    Comment: (NOTE) The eGFR has been calculated using the CKD EPI equation. This calculation has not been validated in all clinical situations. eGFR's persistently <60 mL/min signify possible Chronic Kidney Disease.    Anion gap NOT CALCULATED 5 - 15  Ammonia     Status: None   Collection Time: 12/10/16  7:27 PM  Result Value Ref Range   Ammonia 26 9 - 35 umol/L    Ct Head Wo Contrast  Result Date: 12/10/2016 CLINICAL DATA:  Sepsis.  Unresponsive. EXAM: CT HEAD WITHOUT CONTRAST TECHNIQUE: Contiguous axial images were obtained from the base of the skull through the vertex without intravenous contrast. COMPARISON:  11/03/2016 FINDINGS: Brain: There is atrophy and chronic small vessel disease changes. 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 No acute intracranial abnormality. Specifically, no hemorrhage, hydrocephalus, mass lesion, acute infarction, or significant intracranial injury. Vascular: No hyperdense vessel or unexpected calcification. Skull: No acute calvarial abnormality. Sinuses/Orbits: Visualized paranasal sinuses and mastoids clear. Orbital soft tissues unremarkable. Other: None IMPRESSION: No acute intracranial abnormality. Atrophy, chronic microvascular disease. Electronically  Signed   By: Rolm Baptise M.D.   On: 12/10/2016 09:43   US Renal  Result Date: 12/10/2016 CLINICAL DATA:  71 year old female with acute renal injury. EXAM: RENAL / URINARY TRACT ULTRASOUND COMPLETE COMPARISON:  Noncontrast CT Abdomen and Pelvis 08/24/2016. Renal ultrasound 08/23/2016 FINDINGS: Right Kidney: Length: 8.3 cm. Chronic increased renal echogenicity. Prominence of the left lower pole collecting system is stable since November with no overt hydronephrosis. No right renal mass identified. Left Kidney: Length: 10.2 cm. This kidney appears less echogenic than the right. No hydronephrosis or left renal mass. Bladder: Dependent echogenic debris (images 33, 40).  Diminutive bladder. IMPRESSION: 1. No acute renal findings. Right greater than left renal echogenicity raising the possibility of chronic medical renal disease. 2. Small volume of echogenic debris within the urinary bladder, correlate with urinalysis. Electronically Signed   By: Genevie Ann M.D.   On: 12/10/2016 11:31   Dg Chest Port 1 View  Result Date: 12/10/2016 CLINICAL DATA:  Shortness of Breath EXAM: PORTABLE CHEST 1 VIEW COMPARISON:  07/31/2016 FINDINGS: Cardiac shadow is within normal limits. Calcified granulomas are noted on the right. The lungs are well aerated bilaterally. No focal infiltrate or sizable effusion is seen. No acute bony abnormality is noted. Old rib fractures are again seen on the left. IMPRESSION: Prior granulomatous disease.  No acute abnormality noted. Electronically Signed   By: Inez Catalina M.D.   On: 12/10/2016 07:47    Review of Systems  Unable to perform ROS: Acuity of condition   Blood pressure 90/65, pulse 94, temperature 97.7 F (36.5 C), temperature source Axillary, resp. rate (!) 26, height '5\' 7"'$  (1.702 m), weight 43.6 kg (96 lb 1.9 oz), SpO2 100 %. Physical Exam  Constitutional: She is oriented to person, place, and time. She appears listless. She appears cachectic. She has a sickly appearance.  HENT:   Head: Normocephalic and atraumatic.  Eyes: EOM are normal. Pupils are equal, round, and reactive to light. Right eye exhibits no discharge. Left eye exhibits no discharge. No scleral icterus.  Neck: No tracheal deviation present. No thyromegaly present.  Cardiovascular: Normal rate, regular rhythm, normal heart sounds and intact distal pulses.  Exam reveals no gallop and no friction rub.   No murmur heard. Respiratory: Effort normal and breath  sounds normal. No stridor. No respiratory distress. She has no wheezes. She has no rales. She exhibits no tenderness.  GI: Soft. Bowel sounds are normal. She exhibits no distension and no mass. There is no tenderness. There is no rebound and no guarding.  Musculoskeletal: Normal range of motion. She exhibits no edema, tenderness or deformity.  Lymphadenopathy:    She has no cervical adenopathy.  Neurological: She is oriented to person, place, and time. She appears listless.  Skin: Skin is warm and dry.    Assessment/Plan: 71 year old female with multiple medical issues, currently sepsis, hypernatremia, dehydration, stage IV decubitus ulcer.  1. At this time would recommend MRI to have a possible osteomyelitis. 2. Patient does have an area at the surface of the dermis that appears ischemic in nature. This will likely have to be debrided at some point. This potentially could be debrided at the bedside. Patient does not appear to be an operative candidate. 3. Patient would likely benefit from palliative care consult.  Rosario Jacks., Catlyn Shipton 12/10/2016, 8:53 PM

## 2016-12-10 NOTE — ED Notes (Signed)
RT at bedside pt placed on Bipap pt remains on the monitor no change in condition

## 2016-12-10 NOTE — Progress Notes (Signed)
ANTICOAGULATION CONSULT NOTE - Initial Consult  Pharmacy Consult for Xarelto to Heparin Indication: DVT history  Allergies  Allergen Reactions  . Codeine Phosphate Nausea Only    Patient Measurements: Height: 5\' 7"  (170.2 cm) Weight: 96 lb 1.9 oz (43.6 kg) IBW/kg (Calculated) : 61.6  Vital Signs: Temp: 98.1 F (36.7 C) (03/16 2147) Temp Source: Axillary (03/16 2147) BP: 110/70 (03/16 2147) Pulse Rate: 84 (03/16 2147)  Labs:  Recent Labs  12/10/16 0730 12/10/16 1005 12/10/16 1332 12/10/16 1608  HGB 12.5  --   --   --   HCT 44.7  --   --   --   PLT 219  --   --   --   APTT  --   --  26  --   LABPROT 16.7*  --   --   --   INR 1.35  --   --   --   CREATININE 4.86* 4.41*  --  4.34*    Estimated Creatinine Clearance: 8.4 mL/min (A) (by C-G formula based on SCr of 4.34 mg/dL (H)).   Medical History: Past Medical History:  Diagnosis Date  . Adnexal mass   . Alcohol abuse 02/27/2012  . ALLERGIC RHINITIS 06/23/2007  . ANXIETY 06/23/2007  . ANXIETY DEPRESSION 06/12/2009  . Cancer ([place])    skin cx/ hx melanoma  . COLONIC POLYPS, HX OF 03/08/2002  . 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 Dementia   . DIVERTICULOSIS, COLON 08/04/2007  . GERD 06/23/2007  . HYPERLIPIDEMIA 06/24/2007  . HYPERTENSION 06/23/2007  . Irritable bowel syndrome 06/23/2007  . LOW BACK PAIN 06/24/2007  . MELANOMA, HX OF 08/04/2007  . Memory dysfunction 06/23/2011    Assessment: 71 year old female on Xarelto prior to admission for recent DVT. Admitted with ? Osteomyelitis and AKI  To begin heparin while Xarelto on hold Last dose of Xarelto 3/15 AM Will use PTTs to dose heparin until correlates with heparin level  Goal of Therapy:  aPTT 66-102  seconds Monitor platelets by anticoagulation protocol: Yes  Heparin level = 0.3 to 0.7   Plan:  Heparin at 600 units / hr beginning at 8 am 3/17 8 hour heparin level, PTT Daily PTT, heparin level, CBC -- beginning in AM for baseline  labs  Thank you Anette Guarneri, PharmD [phone]  12/10/2016,10:11 PM

## 2016-12-10 NOTE — Progress Notes (Signed)
CRITICAL VALUE ALERT  Critical value received:  Na 175,  Chloride >130  Date of notification:  12/10/16  Time of notification:  2620  Critical value read back:Yes.    Nurse who received alert:  Edward Qualia RN  MD notified (1st page):  Dr Aggie Moats  Time of first page:  [phone]

## 2016-12-10 NOTE — Progress Notes (Addendum)
Pt noted to have not voided since admit to floor. Bladder scan done  and has resulted scanning 143 cc. Bladder scan done at 1700. Had referred pt to Dr. Aggie Moats in light of the pt's BP and underlying critical  lab values and lethargy wether pt is more appropriate for ICU . MD indicated he will consult with critical care dept.

## 2016-12-10 NOTE — Procedures (Signed)
Pt transported to CT on Bipap, no complications.

## 2016-12-10 NOTE — Consult Note (Signed)
[facility] Nurse wound consult note Reason for Consult: sacral pressure injury; noted to have left lateral knee and right lateral malleolus  Wound type: Unstageable pressure injury; Sacrum; 6cm x 4cm x 3cm with circumference undermining that is at deepest point 4.5cm.  Non blanchable redness to the right of the open wound  Unstageable pressure injury: lateral left knee; 0.5cm x 0.5cm x 0cm  Resolving pressure injury; lateral right malleolus; 0.5cm x 0.5cm x 0cm  Pressure Injury POA: Yes Measurement: see above Wound bed: Sacrum: 100% black/grey/yellow with skin necrosis proximal wound edges and undermining. Palpable bone Left lateral knee 100% eschar Drainage (amount, consistency, odor) moderate, foul, grey drainage from the sacrum.  None anywhere else Periwound: intact  Dressing procedure/placement/frequency: Dakin's solution as chemical debridement and assist with odor control Low air loss mattress for pressure redistribution Once determine goals of care, consider palliative  Surgical consult needed if aggressive care desired. Maximize nutrition if aggressive care desired for wound healing.  Notified hospitalist to suggest surgical consult for sacral wound.  [place] nurse team will remain available if needed.  [place], [place]

## 2016-12-10 NOTE — H&P (Signed)
History and Physical    Tara Shaffer:[id number] DOB: Apr 28, 1946 DOA: 12/10/2016  PCP: Tara Cower, MD Patient coming from: [facility]  Chief Complaint: ams  HPI: Tara Shaffer is a 71 y.o. 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 Initial evaluation reveals sepsis likely related to urinary tract infection  Information is obtained from the chart. Patient brought in by EMS for acute mental changes. Associated symptoms include lethargy, tachycardia and tachypnea.  Her baseline is dementia with behavioral disturbances is nonambulatory. Tablet nursing facility indicate that over the last 5 days patient has become increasingly lethargic less interested in food or drink. At her baseline she is alert and oriented to self interactive able to make her wants and needs no known. She is incontinent of bladder and bowel no reports of any fever chills vomiting diarrhea. No reports of any cough shortness of breath lower extremity edema.    ED Course: In emergency department her rectal temp is 102.5 she is hypotensive tachycardia tachypnea mild respiratory distress. Given IV fluids placed on BiPAP antibiotics initiated. Time of admission blood pressure improved normal respiratory effort on BiPAP  Review of Systems: As per HPI otherwise 10 point review of systems negative.   Ambulatory Status: Nonambulatory  Past Medical History:  Diagnosis Date  . Adnexal mass   . Alcohol abuse 02/27/2012  . ALLERGIC RHINITIS 06/23/2007  . ANXIETY 06/23/2007  . ANXIETY DEPRESSION 06/12/2009  . Cancer ([place])    skin cx/ hx melanoma  . COLONIC POLYPS, HX OF 03/08/2002  . 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 Dementia   . DIVERTICULOSIS, COLON 08/04/2007  . GERD  06/23/2007  . HYPERLIPIDEMIA 06/24/2007  . HYPERTENSION 06/23/2007  . Irritable bowel syndrome 06/23/2007  . LOW BACK PAIN 06/24/2007  . MELANOMA, HX OF 08/04/2007  . Memory dysfunction 06/23/2011    Past Surgical History:  Procedure Laterality Date  . APPENDECTOMY    . CESAREAN SECTION     x 2  . COLONOSCOPY      Social History   Social History  . Marital status: Married    Spouse name: Tara Shaffer  . Number of children: Tara Shaffer  . Years of education: Tara Shaffer   Occupational History  . Not on file.   Social History Main Topics  . Smoking status: Never Smoker  . Smokeless tobacco: Never Used  . Alcohol use 25.2 oz/week    42 Glasses of wine per week  . Drug use: No  . Sexual activity: Yes    Birth control/ protection: Post-menopausal   Other Topics Concern  . Not on file   Social History Narrative  . No narrative on file    Allergies  Allergen Reactions  . Codeine Phosphate Nausea Only    Family History  Problem Relation Age of Onset  . Colon cancer Cousin   . Hypertension Other   . Diabetes Other     Prior to Admission medications   Medication Sig Start Date End Date Taking? Authorizing Provider  acetaminophen (TYLENOL) 500 MG tablet Take 500 mg by mouth every 4 (four) hours as needed for mild pain.    Historical Provider, MD  alum & mag hydroxide-simeth ([place]) 200-200-20 MG/5ML suspension Take 30 mLs by mouth every 6 (six) hours as needed for indigestion or heartburn.    Historical Provider, MD  divalproex (DEPAKOTE SPRINKLE)  125 MG capsule Take 250 mg by mouth 3 (three) times daily.     Historical Provider, MD  escitalopram (LEXAPRO) 10 MG tablet Take 10 mg by mouth daily.    Historical Provider, MD  ferrous sulfate 325 (65 FE) MG tablet Take 1 tablet (325 mg total) by mouth 3 (three) times daily after meals. 03/17/15   Tara Kristeen Mans, MD  folic acid (FOLVITE) 1 MG tablet Take 1 tablet (1 mg total) by mouth daily. 03/17/15   Tara Kristeen Mans, MD  guaifenesin  (ROBITUSSIN) 100 MG/5ML syrup Take 200 mg by mouth 3 (three) times daily as needed for cough.    Historical Provider, MD  lanolin ointment Apply 1 application topically as needed for dry skin.    Historical Provider, MD  LORazepam (ATIVAN) 0.5 MG tablet Take 1 tablet (0.5 mg total) by mouth every 8 (eight) hours as needed for anxiety. 08/27/16   [name], DO  magnesium hydroxide (MILK OF MAGNESIA) 400 MG/5ML suspension Take 30 mLs by mouth daily as needed for mild constipation.    Historical Provider, MD  Memantine HCl-Donepezil HCl (NAMZARIC) 28-10 MG CP24 Take 1 capsule by mouth every morning.    Historical Provider, MD  Multiple Vitamin (MULTIVITAMIN WITH MINERALS) TABS tablet Take 1 tablet by mouth daily. 03/17/15   Tara Kristeen Mans, MD  Probiotic Product (PROBIOTIC DAILY) CAPS Take 1 capsule by mouth 2 (two) times daily.    Historical Provider, MD  QUEtiapine (SEROQUEL) 25 MG tablet Take 0.5 tablets (12.5 mg total) by mouth 2 (two) times daily. 08/26/16   [name], MD  rivaroxaban (XARELTO) 20 MG TABS tablet Take 20 mg by mouth daily.    Historical Provider, MD  thiamine 100 MG tablet Take 1 tablet (100 mg total) by mouth daily. 03/17/15   Tara Kristeen Mans, MD  traMADol Veatrice Bourbon) 50 MG tablet Take one tablet by mouth every 8 hours for pain 08/27/16   Tara Curry, DO    Physical Exam: Vitals:   12/10/16 0935 12/10/16 0945 12/10/16 1000 12/10/16 1030  BP:  (!) 86/59 (!) 87/63 95/63  Pulse:  (!) 28 86 82  Resp:  (!) 24 (!) 23 (!) 25  Temp: 99.2 F (37.3 C)     TempSrc: Rectal     SpO2:  97% 98% 99%  Weight:      Height:         General:  Appears Thin and frail slightly pale chronically ill no acute distress Eyes:  PERRL, EOMI, normal lids, iris ENT:  grossly normal hearing, lips & tongue, his membranes of her mouth are pale dry Neck:  no LAD, masses or thyromegaly Cardiovascular: Regular rate and rhythm , no m/r/g. No LE edema.  Respiratory:  Increased work of labor  BiPAP intact respiration somewhat shallow breath sounds distant hear no crackles no wheezing Abdomen:  soft, ntnd, his bowel sounds no guarding or rebounding Skin:  no rash or induration seen on limited exam dressing to sacral area dry and intact Musculoskeletal:  grossly  tone BUE/BLE, good ROM, no bony abnormality joints without swelling/erythema. Left hand with contractures lower extremities hips flexed Psychiatric:  grossly normal mood and affect, speech fluent and appropriate, AOx3 Neurologic:  Opens eyes to verbal stimuli does not follow commands no spontaneous LE movement  Labs on Admission: I have personally reviewed following labs and imaging studies  CBC:  Recent Labs Lab 12/10/16 0730  WBC 19.9*  NEUTROABS 16.9*  HGB 12.5  HCT  44.7  MCV 100.0  PLT 097   Basic Metabolic Panel:  Recent Labs Lab 12/10/16 0730  NA 179*  K 3.8  CL 126*  CO2 26  GLUCOSE 138*  BUN 213*  CREATININE 4.86*  CALCIUM 8.2*   GFR: Estimated Creatinine Clearance: 8.2 mL/min (A) (by C-G formula based on SCr of 4.86 mg/dL (H)). Liver Function Tests:  Recent Labs Lab 12/10/16 0730  AST 38  ALT 20  ALKPHOS 68  BILITOT 0.9  PROT 7.3  ALBUMIN 2.7*    Recent Labs Lab 12/10/16 0730  LIPASE 55*   No results for input(s): AMMONIA in the last 168 hours. Coagulation Profile:  Recent Labs Lab 12/10/16 0730  INR 1.35   Cardiac Enzymes: No results for input(s): CKTOTAL, CKMB, CKMBINDEX, TROPONINI in the last 168 hours. BNP (last 3 results) No results for input(s): PROBNP in the last 8760 hours. HbA1C: No results for input(s): HGBA1C in the last 72 hours. CBG: No results for input(s): GLUCAP in the last 168 hours. Lipid Profile: No results for input(s): CHOL, HDL, LDLCALC, TRIG, CHOLHDL, LDLDIRECT in the last 72 hours. Thyroid Function Tests: No results for input(s): TSH, T4TOTAL, FREET4, T3FREE, THYROIDAB in the last 72 hours. Anemia Panel: No results for input(s): VITAMINB12,  FOLATE, FERRITIN, TIBC, IRON, RETICCTPCT in the last 72 hours. Urine analysis:    Component Value Date/Time   COLORURINE AMBER (A) 12/10/2016 0859   APPEARANCEUR TURBID (A) 12/10/2016 0859   LABSPEC 1.019 12/10/2016 0859   PHURINE 5.0 12/10/2016 0859   GLUCOSEU 50 (A) 12/10/2016 0859   GLUCOSEU NEGATIVE 09/06/2012 0858   HGBUR SMALL (A) 12/10/2016 0859   BILIRUBINUR NEGATIVE 12/10/2016 0859   KETONESUR NEGATIVE 12/10/2016 0859   PROTEINUR 100 (A) 12/10/2016 0859   UROBILINOGEN 1.0 03/13/2015 1747   NITRITE NEGATIVE 12/10/2016 0859   LEUKOCYTESUR MODERATE (A) 12/10/2016 0859    Creatinine Clearance: Estimated Creatinine Clearance: 8.2 mL/min (A) (by C-G formula based on SCr of 4.86 mg/dL (H)).  Sepsis Labs: @LABRCNTIP (procalcitonin:4,lacticidven:4) )No results found for this or any previous visit (from the past 240 hour(s)).   Radiological Exams on Admission: Ct Head Wo Contrast  Result Date: 12/10/2016 CLINICAL DATA:  Sepsis.  Unresponsive. EXAM: CT HEAD WITHOUT CONTRAST TECHNIQUE: Contiguous axial images were obtained from the base of the skull through the vertex without intravenous contrast. COMPARISON:  11/03/2016 FINDINGS: Brain: There is atrophy and chronic small vessel disease changes. 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 No acute intracranial abnormality. Specifically, no hemorrhage, hydrocephalus, mass lesion, acute infarction, or significant intracranial injury. Vascular: No hyperdense vessel or unexpected calcification. Skull: No acute calvarial abnormality. Sinuses/Orbits: Visualized paranasal sinuses and mastoids clear. Orbital soft tissues unremarkable. Other: None IMPRESSION: No acute intracranial abnormality. Atrophy, chronic microvascular disease. Electronically Signed   By: Rolm Baptise M.D.   On: 12/10/2016 09:43   Dg Chest Port 1 View  Result Date: 12/10/2016 CLINICAL DATA:  Shortness of Breath EXAM: PORTABLE CHEST 1 VIEW COMPARISON:  07/31/2016 FINDINGS:  Cardiac shadow is within normal limits. Calcified granulomas are noted on the right. The lungs are well aerated bilaterally. No focal infiltrate or sizable effusion is seen. No acute bony abnormality is noted. Old rib fractures are again seen on the left. IMPRESSION: Prior granulomatous disease.  No acute abnormality noted. Electronically Signed   By: Inez Catalina M.D.   On: 12/10/2016 07:47    EKG: Independently reviewed. Sinus tachycardia with irregular rate Low voltage, extremity leads Prolonged QT interval  Assessment/Plan Principal Problem:  Sepsis ([place]) Active Problems:   HLD (hyperlipidemia)   Essential hypertension   GERD   Dementia with behavioral disturbance   Pressure ulcer   DVT, femoral, chronic (HCC)   Anemia of chronic disease   CKD (chronic kidney disease) stage 2, GFR 60-89 ml/min   UTI (urinary tract infection)   ARF (acute renal failure) (HCC)   Protein-calorie malnutrition, severe   Acute encephalopathy   Hypernatremia   Adnexal mass   Respiratory distress    #1. Sepsis. Likely related to urinary tract infection. Urinalysis consistent with UTI, chest x-ray unremarkable leukocytosis 19.9, temperature 102.2, elevated lactic acid, patient hypotensive tachycardic. In mild respiratory distress with hypoxia. She is provided with IV fluids placed on BiPAP IV antibiotics initiated. The time of admission respiratory effort much improved lactic acid is trending down blood pressure remains soft but stable tachycardia resolved. She is more alert but does not engage -Admit to stepdown -continue bipap -IV fluids -Follow blood cultures -Follow urine culture -Continue broad antibiotics -Monitor closely  #2. Respiratory distress. Likely related to above. Chest x-ray as noted above. ABG reassuring. Improved on BiPAP -Continue BiPAP -Wean as able -See #1  3. Urinary tract infection. Patient with a history of urinary retention and chronic indwelling Foley catheter. Also has a  history of hydronephrosis. -antibiotics per protocol -Follow urine culture. -Monitor -renal US  4. Acute renal failure in the setting of chronic kidney disease stage II. I clearly related to dehydration in the setting of UTI and decreased oral intake. Creatinine 4.8.  Creatinine within the limits of normal last month according to chart review. -IV fluids -Hold nephrotoxins -Monitor urine output -Renal ultrasound  #5. Hypernatremia. Sodium 179. Likely related to decrease oral intake in setting of infectious process. Staff at nursing facility who indicate patient has a tendency to refuse food and medicines if anxious or displeased with caregiver. Been quite lethargic over the last 2 days reportedly. Received IV fluids per sepsis protocol in ED -change fluids to D10 -recheck bmet this afternoon -obtain urine sodium and osmolality -obtain serum osmolality -monitor closely  6. Acute encephalopathy. Likely related to all the above. Staff at the facility indicate patient's baseline is dementia but she usually alert oriented to self only is able to make her wants and needs known. CT of the head without acute abnormality. Shin more alert at the time of admission -B12 -folate -monitor  7. Dementia with behavioral disturbance. -Continue home meds  8. DVT. Recently diagnosed. Home meds include xaerelto.  -hold due to #4 -heparin -monitor -resume home meds as able  #9. Hypertension. Patient hypotensive upon presentation as noted above. Blood pressure remains at the low end of normal but is stable at time of admission -no anti-hypertensive meds on home list -monitor  #10. Adnexal mass per CT done previously -OP follow up with [place]  #11. Sacral decub. Stage 4 per chart. No odor, drainagle -overlay mattress -daily dressing changes -monitor   DVT prophylaxis: heparin Code Status: full  Family Communication: none present  Disposition Plan: back to facility  Consults called: none    Admission status: inpatient    Radene Gunning MD [facility]  If 7PM-7AM, please contact night-coverage [email or web address] Password [facility]  12/10/2016, 10:53 AM

## 2016-12-10 NOTE — ED Notes (Signed)
Pt brought over to Rm 15 on monitor with Bipap

## 2016-12-10 NOTE — ED Provider Notes (Signed)
[facility] DEPT Provider Note   CSN: [id number] Arrival date & time: 12/10/16  0720     History   Chief Complaint Chief Complaint  Patient presents with  . Altered Mental Status    pt found this morning to be unresponsive with increased respirations and weak radial pulses    HPI Tara Shaffer is a 71 y.o. female.  Patient brought in by EMS from nursing home for acute mental status change this morning. Patient is known to have a baseline mental status change. Patient nonambulatory from reading previous notes.  Patient not responsive. Patient feels very warm to touch. Patient has contraction to the left hand has a boot on the left foot. Patient's breathing is shallow. Patient reportedly full code.       Past Medical History:  Diagnosis Date  . Alcohol abuse 02/27/2012  . ALLERGIC RHINITIS 06/23/2007  . ANXIETY 06/23/2007  . ANXIETY DEPRESSION 06/12/2009  . Cancer ([place])    skin cx/ hx melanoma  . COLONIC POLYPS, HX OF 03/08/2002  . 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 Dementia   . DIVERTICULOSIS, COLON 08/04/2007  . GERD 06/23/2007  . HYPERLIPIDEMIA 06/24/2007  . HYPERTENSION 06/23/2007  . Irritable bowel syndrome 06/23/2007  . Shaffer BACK PAIN 06/24/2007  . MELANOMA, HX OF 08/04/2007  . Memory dysfunction 06/23/2011    Patient Active Problem List   Diagnosis Date Noted  . 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 Intertrochanteric fracture of left hip ([place]) 08/27/2016  . Urine retention 08/27/2016  . Hydronephrosis   . Protein-calorie malnutrition, severe 08/23/2016  . ARF (acute renal failure) ([place]) 08/21/2016  . UTI (urinary tract infection) 12/24/2015  . AKI (acute kidney injury) ([place]) 12/23/2015  . Anemia of chronic disease 08/29/2015  . CKD (chronic kidney disease) stage 2, GFR 60-89 ml/min 08/29/2015  . Mood disorder ([place]) 06/11/2015  . DVT, femoral, chronic ([place]) 03/20/2015  . Pressure ulcer 03/14/2015  . Recurrent  falls 10/31/2014  . Impaired glucose tolerance 10/31/2014  . 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 Hypokalemia 03/21/2012  . Alcohol abuse 02/27/2012  . Dementia with behavioral disturbance 06/23/2011  . 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 DIVERTICULOSIS, COLON 08/04/2007  . MELANOMA, HX OF 08/04/2007  . HLD (hyperlipidemia) 06/24/2007  . Anxiety state 06/23/2007  . Essential hypertension 06/23/2007  . ALLERGIC RHINITIS 06/23/2007  . GERD 06/23/2007  . Irritable bowel syndrome 06/23/2007    Past Surgical History:  Procedure Laterality Date  . APPENDECTOMY    . CESAREAN SECTION     x 2  . COLONOSCOPY      OB History    No data available       Home Medications    Prior to Admission medications   Medication Sig Start Date End Date Taking? Authorizing Provider  acetaminophen (TYLENOL) 500 MG tablet Take 500 mg by mouth every 4 (four) hours as needed for mild pain.    Historical Provider, MD  alum & mag hydroxide-simeth (N[place]rod) 200-200-20 MG/5ML suspension Take 30 mLs by mouth every 6 (six) hours as needed for indigestion or heartburn.    Historical Provider, MD  divalproex (DEPAKOTE SPRINKLE) 125 MG capsule Take 250 mg by mouth 3 (three) times daily.     Historical Provider, MD  escitalopram (LEXAPRO) 10 MG tablet Take 10 mg by mouth daily.    Historical Provider, MD  ferrous sulfate 325 (65 FE) MG tablet Take 1 tablet (325 mg  total) by mouth 3 (three) times daily after meals. 03/17/15   [name], MD  folic acid (FOLVITE) 1 MG tablet Take 1 tablet (1 mg total) by mouth daily. 03/17/15   [name], MD  guaifenesin (ROBITUSSIN) 100 MG/5ML syrup Take 200 mg by mouth 3 (three) times daily as needed for cough.    Historical Provider, MD  lanolin ointment Apply 1 application topically as needed for dry skin.    Historical Provider, MD  LORazepam (ATIVAN) 0.5 MG tablet Take 1 tablet (0.5 mg total) by mouth every 8  (eight) hours as needed for anxiety. 08/27/16   [name], DO  magnesium hydroxide (MILK OF MAGNESIA) 400 MG/5ML suspension Take 30 mLs by mouth daily as needed for mild constipation.    Historical Provider, MD  Memantine HCl-Donepezil HCl (NAMZARIC) 28-10 MG CP24 Take 1 capsule by mouth every morning.    Historical Provider, MD  Multiple Vitamin (MULTIVITAMIN WITH MINERALS) TABS tablet Take 1 tablet by mouth daily. 03/17/15   [name], MD  Probiotic Product (PROBIOTIC DAILY) CAPS Take 1 capsule by mouth 2 (two) times daily.    Historical Provider, MD  QUEtiapine (SEROQUEL) 25 MG tablet Take 0.5 tablets (12.5 mg total) by mouth 2 (two) times daily. 08/26/16   [name], MD  rivaroxaban (XARELTO) 20 MG TABS tablet Take 20 mg by mouth daily.    Historical Provider, MD  thiamine 100 MG tablet Take 1 tablet (100 mg total) by mouth daily. 03/17/15   [name], MD  traMADol Veatrice Bourbon) 50 MG tablet Take one tablet by mouth every 8 hours for pain 08/27/16   [name], DO    Family History Family History  Problem Relation Age of Onset  . Colon cancer Cousin   . Hypertension Other   . Diabetes Other     Social History Social History  Substance Use Topics  . Smoking status: Never Smoker  . Smokeless tobacco: Never Used  . Alcohol use 25.2 oz/week    42 Glasses of wine per week     Allergies   Codeine phosphate   Review of Systems Review of Systems  Unable to perform ROS: Mental status change     Physical Exam Updated Vital Signs BP 98/64   Pulse 83   Temp (!) 102.5 F (39.2 C) (Rectal)   Resp (!) 24   Ht 5\' 7"  (1.702 m)   Wt 47.6 kg   SpO2 99%   BMI 16.45 kg/m   Physical Exam  Constitutional: She appears distressed.  HENT:  Membranes very dry.  Eyes:  Pupils reactive.  Cardiovascular:  Tachycardic  Pulmonary/Chest: She is in respiratory distress.  Very shallow breathing.  Abdominal: Soft. She exhibits no distension.  Musculoskeletal:  Normal range of motion. She exhibits no edema.  Neurological:  Not responsive.  Skin: Skin is warm.  Skin very warm feels febrile. Large sacral decubitus. Measuring probably about 8 x 4 cm.  Nursing note and vitals reviewed.    ED Treatments / Results  Labs (all labs ordered are listed, but only abnormal results are displayed) Labs Reviewed  CBC WITH DIFFERENTIAL/PLATELET - Abnormal; Notable for the following:       Result Value   WBC 19.9 (*)    MCHC 28.0 (*)    RDW 17.4 (*)    Neutro Abs 16.9 (*)    Monocytes Absolute 1.5 (*)    All other components within normal limits  PROTIME-INR - Abnormal;  Notable for the following:    Prothrombin Time 16.7 (*)    All other components within normal limits  I-STAT CG4 LACTIC ACID, ED - Abnormal; Notable for the following:    Lactic Acid, Venous 3.84 (*)    All other components within normal limits  CULTURE, BLOOD (ROUTINE X 2)  CULTURE, BLOOD (ROUTINE X 2)  COMPREHENSIVE METABOLIC PANEL  URINALYSIS, ROUTINE W REFLEX MICROSCOPIC  VALPROIC ACID LEVEL  LIPASE, BLOOD  I-STAT CG4 LACTIC ACID, ED  I-STAT ARTERIAL BLOOD GAS, ED    EKG  EKG Interpretation  Date/Time:  Friday December 10 2016 07:24:53 EDT Ventricular Rate:  111 PR Interval:    QRS Duration: 81 QT Interval:  400 QTC Calculation: 544 R Axis:   -2 Text Interpretation:  Sinus tachycardia with irregular rate Shaffer voltage, extremity leads Prolonged QT interval Artifact ST depression in Lateral leads Confirmed by Catalena Stanhope  MD, Dyanna Seiter [phone]) on 12/10/2016 7:53:24 AM       Radiology Dg Chest Port 1 View  Result Date: 12/10/2016 CLINICAL DATA:  Shortness of Breath EXAM: PORTABLE CHEST 1 VIEW COMPARISON:  07/31/2016 FINDINGS: Cardiac shadow is within normal limits. Calcified granulomas are noted on the right. The lungs are well aerated bilaterally. No focal infiltrate or sizable effusion is seen. No acute bony abnormality is noted. Old rib fractures are again seen on the left.  IMPRESSION: Prior granulomatous disease.  No acute abnormality noted. Electronically Signed   By: Inez Catalina M.D.   On: 12/10/2016 07:47    Procedures Procedures (including critical care time)  CRITICAL CARE Performed by: Fredia Sorrow Total critical care time: 45 minutes Critical care time was exclusive of separately billable procedures and treating other patients. Critical care was necessary to treat or prevent imminent or life-threatening deterioration. Critical care was time spent personally by me on the following activities: development of treatment plan with patient and/or surrogate as well as nursing, discussions with consultants, evaluation of patient's response to treatment, examination of patient, obtaining history from patient or surrogate, ordering and performing treatments and interventions, ordering and review of laboratory studies, ordering and review of radiographic studies, pulse oximetry and re-evaluation of patient's condition.   Medications Ordered in ED Medications  sodium chloride 0.9 % bolus 1,000 mL (1,000 mLs Intravenous New Bag/Given 12/10/16 0752)    And  sodium chloride 0.9 % bolus 500 mL (500 mLs Intravenous New Bag/Given 12/10/16 0840)  vancomycin (VANCOCIN) IVPB 1000 mg/200 mL premix (1,000 mg Intravenous New Bag/Given 12/10/16 0756)  acetaminophen (TYLENOL) suppository 650 mg (650 mg Rectal Given 12/10/16 0818)  piperacillin-tazobactam (ZOSYN) IVPB 3.375 g (0 g Intravenous Stopped 12/10/16 0826)     Initial Impression / Assessment and Plan / ED Course  I have reviewed the triage vital signs and the nursing notes.  Pertinent labs & imaging results that were available during my care of the patient were reviewed by me and considered in my medical decision making (see chart for details).    Patient arrived with parameters consistent with sepsis. Patient started on BiPAP for the shallow breathing. Patient the as per sepsis refill proceed fluid bolus which  brought her pressure up from 77 to Shaffer 90s. Patient on BiPAP had bit her breathing. Chest x-ray negative. Head CT negative. Urinalysis markedly abnormal. Marked leukocytosis. Patient started on Zosyn and thank. Patient's electrolytes showed a significant elevation in the sodium to 178. Also significant change in the BUN and creatinine compared to just February creatinine in the 4 range. Most likely significant  prerenal dehydration. As well as sepsis. Possible organisms or locations would be the large decubitus in the sacral area as well as the urinary tract infection.  Patient's fluids were all normal saline. Contacted the triad hospitalist team for admission. Patient's arterial blood gas was very reasonable. We'll keep on BiPAP for now. Patient does not require intubation at this time.   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)  Other specified hypotension    New Prescriptions New Prescriptions   No medications on file     Fredia Sorrow, MD 12/10/16 1025

## 2016-12-11 LAB — CBC
HEMATOCRIT: 31.8 % — AB (ref 36.0–46.0)
HEMOGLOBIN: 8.9 g/dL — AB (ref 12.0–15.0)
MCH: 28 pg (ref 26.0–34.0)
MCHC: 28 g/dL — ABNORMAL LOW (ref 30.0–36.0)
MCV: 100 fL (ref 78.0–100.0)
Platelets: 107 10*3/uL — ABNORMAL LOW (ref 150–400)
RBC: 3.18 MIL/uL — ABNORMAL LOW (ref 3.87–5.11)
RDW: 16.8 % — AB (ref 11.5–15.5)
WBC: 14.8 10*3/uL — AB (ref 4.0–10.5)

## 2016-12-11 LAB — BASIC METABOLIC PANEL
ANION GAP: 16 — AB (ref 5–15)
ANION GAP: 16 — AB (ref 5–15)
Anion gap: 15 (ref 5–15)
BUN: 180 mg/dL — ABNORMAL HIGH (ref 6–20)
BUN: 164 mg/dL — AB (ref 6–20)
BUN: 155 mg/dL — AB (ref 6–20)
CALCIUM: 7.6 mg/dL — AB (ref 8.9–10.3)
CALCIUM: 7.6 mg/dL — AB (ref 8.9–10.3)
CO2: 24 mmol/L (ref 22–32)
CO2: 21 mmol/L — ABNORMAL LOW (ref 22–32)
CO2: 20 mmol/L — ABNORMAL LOW (ref 22–32)
Calcium: 7.8 mg/dL — ABNORMAL LOW (ref 8.9–10.3)
Chloride: 127 mmol/L — ABNORMAL HIGH (ref 101–111)
Chloride: 125 mmol/L — ABNORMAL HIGH (ref 101–111)
Chloride: 120 mmol/L — ABNORMAL HIGH (ref 101–111)
Creatinine, Ser: 3.85 mg/dL — ABNORMAL HIGH (ref 0.44–1.00)
Creatinine, Ser: 3.6 mg/dL — ABNORMAL HIGH (ref 0.44–1.00)
Creatinine, Ser: 3.33 mg/dL — ABNORMAL HIGH (ref 0.44–1.00)
GFR calc Af Amer: 13 mL/min — ABNORMAL LOW (ref >60)
GFR calc Af Amer: 14 mL/min — ABNORMAL LOW (ref >60)
GFR calc Af Amer: 15 mL/min — ABNORMAL LOW (ref >60)
GFR calc non Af Amer: 12 mL/min — ABNORMAL LOW (ref >60)
GFR calc non Af Amer: 13 mL/min — ABNORMAL LOW (ref >60)
GFR, EST NON AFRICAN AMERICAN: 11 mL/min — AB (ref >60)
GLUCOSE: 330 mg/dL — AB (ref 65–99)
GLUCOSE: 247 mg/dL — AB (ref 65–99)
GLUCOSE: 207 mg/dL — AB (ref 65–99)
POTASSIUM: 3.1 mmol/L — AB (ref 3.5–5.1)
Potassium: 2.7 mmol/L — CL (ref 3.5–5.1)
Potassium: 2.8 mmol/L — ABNORMAL LOW (ref 3.5–5.1)
SODIUM: 167 mmol/L — AB (ref 135–145)
SODIUM: 162 mmol/L — AB (ref 135–145)
SODIUM: 155 mmol/L — AB (ref 135–145)

## 2016-12-11 LAB — COMPREHENSIVE METABOLIC PANEL
ALBUMIN: 1.9 g/dL — AB (ref 3.5–5.0)
ALT: 18 U/L (ref 14–54)
ANION GAP: 14 (ref 5–15)
AST: 26 U/L (ref 15–41)
Alkaline Phosphatase: 56 U/L (ref 38–126)
BUN: 183 mg/dL — ABNORMAL HIGH (ref 6–20)
CO2: 25 mmol/L (ref 22–32)
Calcium: 7.5 mg/dL — ABNORMAL LOW (ref 8.9–10.3)
Chloride: 130 mmol/L — ABNORMAL HIGH (ref 101–111)
Creatinine, Ser: 4.12 mg/dL — ABNORMAL HIGH (ref 0.44–1.00)
GFR calc Af Amer: 12 mL/min — ABNORMAL LOW (ref >60)
GFR calc non Af Amer: 10 mL/min — ABNORMAL LOW (ref >60)
GLUCOSE: 304 mg/dL — AB (ref 65–99)
POTASSIUM: 3.2 mmol/L — AB (ref 3.5–5.1)
SODIUM: 169 mmol/L — AB (ref 135–145)
Total Bilirubin: 0.6 mg/dL (ref 0.3–1.2)
Total Protein: 5.4 g/dL — ABNORMAL LOW (ref 6.5–8.1)

## 2016-12-11 LAB — BLOOD CULTURE ID PANEL (REFLEXED)
ACINETOBACTER BAUMANNII: NOT DETECTED
CANDIDA ALBICANS: NOT DETECTED
CANDIDA GLABRATA: NOT DETECTED
CANDIDA KRUSEI: NOT DETECTED
CANDIDA PARAPSILOSIS: NOT DETECTED
CANDIDA TROPICALIS: NOT DETECTED
ENTEROBACTER CLOACAE COMPLEX: NOT DETECTED
ENTEROBACTERIACEAE SPECIES: NOT DETECTED
ESCHERICHIA COLI: NOT DETECTED
Enterococcus species: NOT DETECTED
Haemophilus influenzae: NOT DETECTED
KLEBSIELLA OXYTOCA: NOT DETECTED
KLEBSIELLA PNEUMONIAE: NOT DETECTED
Listeria monocytogenes: NOT DETECTED
Methicillin resistance: DETECTED — AB
Neisseria meningitidis: NOT DETECTED
PSEUDOMONAS AERUGINOSA: NOT DETECTED
Proteus species: NOT DETECTED
STREPTOCOCCUS PNEUMONIAE: NOT DETECTED
STREPTOCOCCUS PYOGENES: NOT DETECTED
Serratia marcescens: NOT DETECTED
Staphylococcus aureus (BCID): NOT DETECTED
Staphylococcus species: DETECTED — AB
Streptococcus agalactiae: NOT DETECTED
Streptococcus species: NOT DETECTED

## 2016-12-11 LAB — HEPARIN LEVEL (UNFRACTIONATED): Heparin Unfractionated: 0.31 IU/mL (ref 0.30–0.70)

## 2016-12-11 LAB — MAGNESIUM: Magnesium: 2.6 mg/dL — ABNORMAL HIGH (ref 1.7–2.4)

## 2016-12-11 LAB — PHOSPHORUS: Phosphorus: 4.2 mg/dL (ref 2.5–4.6)

## 2016-12-11 LAB — APTT
APTT: 30 s (ref 24–36)
APTT: 45 s — AB (ref 24–36)

## 2016-12-11 MED ORDER — POTASSIUM CHLORIDE 2 MEQ/ML IV SOLN
30.0000 meq | Freq: Once | INTRAVENOUS | Status: AC
  Administered 2016-12-11: 30 meq via INTRAVENOUS
  Filled 2016-12-11: qty 15

## 2016-12-11 MED ORDER — CHLORHEXIDINE GLUCONATE 0.12 % MT SOLN
15.0000 mL | Freq: Two times a day (BID) | OROMUCOSAL | Status: DC
  Administered 2016-12-11 – 2016-12-14 (×7): 15 mL via OROMUCOSAL
  Filled 2016-12-11 (×6): qty 15

## 2016-12-11 MED ORDER — VALPROATE SODIUM 500 MG/5ML IV SOLN
250.0000 mg | Freq: Three times a day (TID) | INTRAVENOUS | Status: DC
  Administered 2016-12-11 – 2016-12-15 (×12): 250 mg via INTRAVENOUS
  Filled 2016-12-11 (×14): qty 2.5

## 2016-12-11 MED ORDER — DEXTROSE 5 % IV SOLN
INTRAVENOUS | Status: DC
  Administered 2016-12-11 – 2016-12-15 (×6): via INTRAVENOUS

## 2016-12-11 MED ORDER — SODIUM CHLORIDE 0.9 % IV SOLN
30.0000 meq | Freq: Once | INTRAVENOUS | Status: AC
  Administered 2016-12-12: 30 meq via INTRAVENOUS
  Filled 2016-12-11: qty 15

## 2016-12-11 MED ORDER — RESOURCE THICKENUP CLEAR PO POWD
ORAL | Status: DC | PRN
  Filled 2016-12-11: qty 125

## 2016-12-11 MED ORDER — ORAL CARE MOUTH RINSE
15.0000 mL | Freq: Two times a day (BID) | OROMUCOSAL | Status: DC
  Administered 2016-12-12 – 2016-12-15 (×8): 15 mL via OROMUCOSAL

## 2016-12-11 MED ORDER — POTASSIUM CHLORIDE CRYS ER 20 MEQ PO TBCR
40.0000 meq | EXTENDED_RELEASE_TABLET | Freq: Once | ORAL | Status: AC
  Administered 2016-12-12: 40 meq via ORAL
  Filled 2016-12-11: qty 2

## 2016-12-11 MED ORDER — LACTATED RINGERS IV BOLUS (SEPSIS)
500.0000 mL | Freq: Once | INTRAVENOUS | Status: AC
  Administered 2016-12-11: 500 mL via INTRAVENOUS

## 2016-12-11 NOTE — Progress Notes (Signed)
PHARMACY - PHYSICIAN COMMUNICATION CRITICAL VALUE ALERT - BLOOD CULTURE IDENTIFICATION (BCID)  Results for orders placed or performed during the hospital encounter of 12/10/16  Blood Culture ID Panel (Reflexed) (Collected: 12/10/2016  7:30 AM)  Result Value Ref Range   Enterococcus species NOT DETECTED NOT DETECTED   Listeria monocytogenes NOT DETECTED NOT DETECTED   Staphylococcus species DETECTED (A) NOT DETECTED   Staphylococcus aureus NOT DETECTED NOT DETECTED   Methicillin resistance DETECTED (A) NOT DETECTED   Streptococcus species NOT DETECTED NOT DETECTED   Streptococcus agalactiae NOT DETECTED NOT DETECTED   Streptococcus pneumoniae NOT DETECTED NOT DETECTED   Streptococcus pyogenes NOT DETECTED NOT DETECTED   Acinetobacter baumannii NOT DETECTED NOT DETECTED   Enterobacteriaceae species NOT DETECTED NOT DETECTED   Enterobacter cloacae complex NOT DETECTED NOT DETECTED   Escherichia coli NOT DETECTED NOT DETECTED   Klebsiella oxytoca NOT DETECTED NOT DETECTED   Klebsiella pneumoniae NOT DETECTED NOT DETECTED   Proteus species NOT DETECTED NOT DETECTED   Serratia marcescens NOT DETECTED NOT DETECTED   Haemophilus influenzae NOT DETECTED NOT DETECTED   Neisseria meningitidis NOT DETECTED NOT DETECTED   Pseudomonas aeruginosa NOT DETECTED NOT DETECTED   Candida albicans NOT DETECTED NOT DETECTED   Candida glabrata NOT DETECTED NOT DETECTED   Candida krusei NOT DETECTED NOT DETECTED   Candida parapsilosis NOT DETECTED NOT DETECTED   Candida tropicalis NOT DETECTED NOT DETECTED    Name of physician (or Provider) Contacted:   NA  Changes to prescribed antibiotics required:   None  Methicillin resistant CoNS in 1/2 Blood cultures, likely contaminant.  Currently receiving Vancomycin and Zosyn    Caryl Pina 12/11/2016  7:36 AM

## 2016-12-11 NOTE — Evaluation (Addendum)
Clinical/Bedside Swallow Evaluation Patient Details  Name: Tara Shaffer MRN: [id number] Date of Birth: [birth year]  Today's Date: 12/11/2016 Time: SLP Start Time (ACUTE ONLY): 0945 SLP Stop Time (ACUTE ONLY): 1015 SLP Time Calculation (min) (ACUTE ONLY): 30 min  Past Medical History:  Past Medical History:  Diagnosis Date  . Adnexal mass   . Alcohol abuse 02/27/2012  . ALLERGIC RHINITIS 06/23/2007  . ANXIETY 06/23/2007  . ANXIETY DEPRESSION 06/12/2009  . Cancer ([place])    skin cx/ hx melanoma  . COLONIC POLYPS, HX OF 03/08/2002  . 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 Dementia   . DIVERTICULOSIS, COLON 08/04/2007  . GERD 06/23/2007  . HYPERLIPIDEMIA 06/24/2007  . HYPERTENSION 06/23/2007  . Irritable bowel syndrome 06/23/2007  . LOW BACK PAIN 06/24/2007  . MELANOMA, HX OF 08/04/2007  . Memory dysfunction 06/23/2011   Past Surgical History:  Past Surgical History:  Procedure Laterality Date  . APPENDECTOMY    . CESAREAN SECTION     x 2  . COLONOSCOPY     HPI:  Tara Shaffer a 71 y.o.female. Patient brought in by EMS from nursing home for acute mental status change, admitted per MD secondary to what appears to be significant dementia, behavioral disturbances, likely sepsis. Pt has history of alcohol associated dementia with behavioral disturbance, hx of GERD. Head CT impression 12/10/16: No acute intracranial abnormality, atrophy, chronic microvascular disease. CXR 12/10/16 Prior granulomatous disease. No acute abnormality noted. Bedside swallowing evaluation 12/24/15 during prior admission for UTI, regular diet with thin liquids recommended with no SLP follow-up warranted.   Assessment / Plan / Recommendation Clinical Impression  Patient presents with moderate risk for aspiration given cognitive impairment, history of GERD, oral weakness and inability to tolerate fully upright, midline posture. Initially tolerates thin liquids with no overt signs of aspiration,  however during 3 oz water swallow challenge pt with appearance of delayed swallow initiation, multiple swallows, and immediate coughing episode suggestive of reduced airway protection. These signs of aspiration not present with nectar thickened liquids despite challenging with consecutive boluses via straw. Oral manipulation for regular solid adequate, however patient is unable to bite cracker, and presents with prolonged mastication, suspect due to generalized oral weakness. Recommend initiating conservative diet of dys 2 with nectar thick liquids, medication whole in puree. F/u SLP on Monday to consider instrumental exam to identify safest, least restrictive diet, aid in treatment planning and identify compensatory techniques to improve swallow function. Given postural limitiations, FEES is likely most appropriate for this patient.  SLP Visit Diagnosis: Dysphagia, unspecified (R13.10)    Aspiration Risk  Moderate aspiration risk    Diet Recommendation Dysphagia 2 (Fine chop);Nectar-thick liquid   Liquid Administration via: Cup;Straw Medication Administration: Whole meds with puree Supervision: Full supervision/cueing for compensatory strategies;Staff to assist with self feeding Compensations: Slow rate;Small sips/bites Postural Changes: Other (Comment) (Maximally tolerated upright position, support for midline)    Other  Recommendations Oral Care Recommendations: Oral care BID Other Recommendations: Order thickener from pharmacy   Follow up Recommendations Other (comment) (TBD)      Frequency and Duration min 2x/week  2 weeks       Prognosis Prognosis for Safe Diet Advancement: Good Barriers to Reach Goals: Cognitive deficits      Swallow Study   General Date of Onset: 12/10/16 HPI: Tara Shaffer a 71 y.o.female. Patient brought in by EMS from nursing home for acute mental status change, admitted per MD secondary to what appears to be significant dementia,  behavioral  disturbances, likely sepsis. Pt has history of alcohol associated dementia with behavioral disturbance, hx of GERD. Head CT impression 12/10/16: No acute intracranial abnormality, atrophy, chronic microvascular disease. CXR 12/10/16 Prior granulomatous disease. No acute abnormality noted. Bedside swallowing evaluation 12/24/15 during prior admission for UTI, regular diet with thin liquids recommended with no SLP follow-up warranted. Type of Study: Bedside Swallow Evaluation Previous Swallow Assessment: see HPI Diet Prior to this Study: NPO Temperature Spikes Noted: No Respiratory Status: Nasal cannula History of Recent Intubation: No Behavior/Cognition: Alert;Cooperative Oral Cavity Assessment: Dried secretions;Dry Oral Care Completed by SLP: Yes Oral Cavity - Dentition: Missing dentition;Poor condition Vision: Functional for self-feeding Self-Feeding Abilities: Total assist Patient Positioning: Upright in bed Baseline Vocal Quality: Low vocal intensity Volitional Cough: Cognitively unable to elicit Volitional Swallow: Able to elicit    Oral/Motor/Sensory Function Overall Oral Motor/Sensory Function: Generalized oral weakness Facial ROM: Reduced right;Reduced left Facial Symmetry: Within Functional Limits Facial Strength: Reduced right;Reduced left Facial Sensation: Within Functional Limits Lingual ROM: Reduced right;Reduced left Lingual Symmetry: Within Functional Limits Lingual Strength: Reduced Lingual Sensation: Within Functional Limits Velum: Within Functional Limits Mandible: Impaired   Ice Chips Ice chips: Within functional limits   Thin Liquid Thin Liquid: Impaired Presentation: Cup;Straw;Spoon Oral Phase Impairments: Reduced labial seal;Poor awareness of bolus Pharyngeal  Phase Impairments: Multiple swallows;Suspected delayed Swallow;Cough - Immediate;Cough - Delayed    Nectar Thick Nectar Thick Liquid: Within functional limits Presentation: Straw   Honey Thick Honey  Thick Liquid: Not tested   Puree Puree: Within functional limits Presentation: Spoon   Solid   GO   Solid: Impaired Oral Phase Impairments: Impaired mastication Oral Phase Functional Implications: Impaired mastication       Deneise Lever, MS CF-SLP Speech-Language Pathologist [phone]  Aliene Altes 12/11/2016,10:33 AM

## 2016-12-11 NOTE — Progress Notes (Signed)
Subjective: Awake, trying to talk, demented No other acute changes  Objective: Vital signs in last 24 hours: Temp:  [96.5 F (35.8 C)-98.1 F (36.7 C)] 97.5 F (36.4 C) (03/17 0743) Pulse Rate:  [59-97] 66 (03/17 0743) Resp:  [19-27] 22 (03/17 0743) BP: (76-110)/(50-70) 105/58 (03/17 0743) SpO2:  [95 %-100 %] 100 % (03/17 0743) FiO2 (%):  [40 %] 40 % (03/16 1255) Weight:  [44 kg (97 lb)] 44 kg (97 lb) (03/17 0441)    Intake/Output from previous day: 03/16 0701 - 03/17 0700 In: 3075 [I.V.:1225; IV Piggyback:1850] Out: -  Intake/Output this shift: No intake/output data recorded.  Awake, not following commands Decub unchanged  Lab Results:   Recent Labs  12/10/16 0730 12/11/16 0255  WBC 19.9* 14.8*  HGB 12.5 8.9*  HCT 44.7 31.8*  PLT 219 107*   BMET  Recent Labs  12/11/16 0255 12/11/16 0759  NA 169* 167*  K 3.2* 2.7*  CL 130* 127*  CO2 25 24  GLUCOSE 304* 330*  BUN 183* 180*  CREATININE 4.12* 3.85*  CALCIUM 7.5* 7.6*   PT/INR  Recent Labs  12/10/16 0730  LABPROT 16.7*  INR 1.35   ABG  Recent Labs  12/10/16 0839  PHART 7.400  HCO3 24.0    Studies/Results: Ct Head Wo Contrast  Result Date: 12/10/2016 CLINICAL DATA:  Sepsis.  Unresponsive. EXAM: CT HEAD WITHOUT CONTRAST TECHNIQUE: Contiguous axial images were obtained from the base of the skull through the vertex without intravenous contrast. COMPARISON:  11/03/2016 FINDINGS: Brain: There is atrophy and chronic small vessel disease changes. 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 No acute intracranial abnormality. Specifically, no hemorrhage, hydrocephalus, mass lesion, acute infarction, or significant intracranial injury. Vascular: No hyperdense vessel or unexpected calcification. Skull: No acute calvarial abnormality. Sinuses/Orbits: Visualized paranasal sinuses and mastoids clear. Orbital soft tissues unremarkable. Other: None IMPRESSION: No acute intracranial abnormality. Atrophy,  chronic microvascular disease. Electronically Signed   By: Rolm Baptise M.D.   On: 12/10/2016 09:43   US Renal  Result Date: 12/10/2016 CLINICAL DATA:  71 year old female with acute renal injury. EXAM: RENAL / URINARY TRACT ULTRASOUND COMPLETE COMPARISON:  Noncontrast CT Abdomen and Pelvis 08/24/2016. Renal ultrasound 08/23/2016 FINDINGS: Right Kidney: Length: 8.3 cm. Chronic increased renal echogenicity. Prominence of the left lower pole collecting system is stable since November with no overt hydronephrosis. No right renal mass identified. Left Kidney: Length: 10.2 cm. This kidney appears less echogenic than the right. No hydronephrosis or left renal mass. Bladder: Dependent echogenic debris (images 33, 40).  Diminutive bladder. IMPRESSION: 1. No acute renal findings. Right greater than left renal echogenicity raising the possibility of chronic medical renal disease. 2. Small volume of echogenic debris within the urinary bladder, correlate with urinalysis. Electronically Signed   By: Genevie Ann M.D.   On: 12/10/2016 11:31   Dg Chest Port 1 View  Result Date: 12/10/2016 CLINICAL DATA:  Shortness of Breath EXAM: PORTABLE CHEST 1 VIEW COMPARISON:  07/31/2016 FINDINGS: Cardiac shadow is within normal limits. Calcified granulomas are noted on the right. The lungs are well aerated bilaterally. No focal infiltrate or sizable effusion is seen. No acute bony abnormality is noted. Old rib fractures are again seen on the left. IMPRESSION: Prior granulomatous disease.  No acute abnormality noted. Electronically Signed   By: Inez Catalina M.D.   On: 12/10/2016 07:47    Anti-infectives: Anti-infectives    Start     Dose/Rate Route Frequency Ordered Stop   12/12/16 0800  vancomycin (  VANCOCIN) 500 mg in sodium chloride 0.9 % 100 mL IVPB     500 mg 100 mL/hr over 60 Minutes Intravenous Every 48 hours 12/10/16 0925     12/10/16 1400  piperacillin-tazobactam (ZOSYN) IVPB 2.25 g     2.25 g 100 mL/hr over 30 Minutes  Intravenous Every 6 hours 12/10/16 0925     12/10/16 0745  piperacillin-tazobactam (ZOSYN) IVPB 3.375 g     3.375 g 100 mL/hr over 30 Minutes Intravenous  Once 12/10/16 0735 12/10/16 0826   12/10/16 0745  vancomycin (VANCOCIN) IVPB 1000 mg/200 mL premix     1,000 mg 200 mL/hr over 60 Minutes Intravenous  Once 12/10/16 0735 12/10/16 0856      Assessment/Plan: Stage IV sacral decub Multiple med issues.  Recommendations as outlined by Dr. Rosendo Gros last evening  LOS: 1 day    Captain Blucher A 12/11/2016

## 2016-12-11 NOTE — Progress Notes (Signed)
[place] for Xarelto to Heparin Indication: DVT history  Allergies  Allergen Reactions  . Codeine Phosphate Nausea Only    Patient Measurements: Height: 5\' 7"  (170.2 cm) Weight: 97 lb (44 kg) IBW/kg (Calculated) : 61.6  Vital Signs: Temp: 97.8 F (36.6 C) (03/17 1200) Temp Source: Axillary (03/17 1200) BP: 107/62 (03/17 1200) Pulse Rate: 66 (03/17 1200)  Labs:  Recent Labs  12/10/16 0730  12/10/16 1332  12/10/16 2136 12/11/16 0255 12/11/16 0759 12/11/16 1547  HGB 12.5  --   --   --   --  8.9*  --   --   HCT 44.7  --   --   --   --  31.8*  --   --   PLT 219  --   --   --   --  107*  --   --   APTT  --   --  26  --   --  30  --   --   LABPROT 16.7*  --   --   --   --   --   --   --   INR 1.35  --   --   --   --   --   --   --   HEPARINUNFRC  --   --   --   --   --  <0.10*  --  0.31  CREATININE 4.86*  < >  --   < > 4.20* 4.12* 3.85*  --   < > = values in this interval not displayed.  Estimated Creatinine Clearance: 9.6 mL/min (A) (by C-G formula based on SCr of 3.85 mg/dL (H)).   Medical History: Past Medical History:  Diagnosis Date  . Adnexal mass   . Alcohol abuse 02/27/2012  . ALLERGIC RHINITIS 06/23/2007  . ANXIETY 06/23/2007  . ANXIETY DEPRESSION 06/12/2009  . Cancer ([place])    skin cx/ hx melanoma  . COLONIC POLYPS, HX OF 03/08/2002  . 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 Dementia   . DIVERTICULOSIS, COLON 08/04/2007  . GERD 06/23/2007  . HYPERLIPIDEMIA 06/24/2007  . HYPERTENSION 06/23/2007  . Irritable bowel syndrome 06/23/2007  . LOW BACK PAIN 06/24/2007  . MELANOMA, HX OF 08/04/2007  . Memory dysfunction 06/23/2011    Assessment: 71 year old female on Xarelto prior to admission for recent DVT. Admitted with ? Osteomyelitis and AKI  Continuing on heparin while Xarelto on hold Last dose of Xarelto 3/15 AM Will use PTTs to dose heparin until correlates with heparin level -Baseline heparin level <0.1,  aPTT 30  Initial heparin level is 0.31, aPTT low at 45. No bleed or IV line issues per RN.  Goal of Therapy:  aPTT 66-102  seconds Monitor platelets by anticoagulation protocol: Yes  Heparin level = 0.3 to 0.7   Plan:  Increase heparin to 700 units / hr 8 hour aPTT Daily aPTT, heparin level, CBC Monitor for s/sx bleeding   Elicia Lamp, PharmD, BCPS Clinical Pharmacist 12/11/2016 5:09 PM

## 2016-12-11 NOTE — Progress Notes (Signed)
[facility] Progress Note  Patient: Tara Shaffer:[id number]   PCP: [name], MD DOB: 09/03/1946   DOA: 12/10/2016   DOS: 12/11/2016   Date of Service: the patient was seen and examined on 12/11/2016  Subjective: More awake, requesting something to drink, able to follow command, knows that she is in the hospital as well as her full name. No acute complaint at present  Brief hospital course: Pt. with PMH of dementia, HTN, sacral decubitus ulcer, recent DVT; admitted on 12/10/2016, with complaint of unresponsive episode, was found to have sepsis and hypernatremia. Currently further plan is further workup for above planned IV antibiotics.  Assessment and Plan: 1. Sepsis ([place])  Coagulase-negative bacteremia 2 out of 2 likely from sacral ulcer Pseudomonas UTI  Patient presented with an unresponsive episode. Patient was initially placed on BiPAP. Currently appears to be back to his baseline based on information provided in the ER notes. Blood cultures are +2 out of 2 for coag is negative staph, will continue vancomycin for that. Urine culture is positive for Pseudomonas, prior cultures were sensitive to all the antibiotics, I would currently continue Zosyn as cephalosporins as significant neurotoxicity in patients with renal failure. Monitor cultures for sensitivity and narrowing down of the antibiotics. Continue stepdown monitoring. BiPAP when necessary  2. Hypernatremia. Acute kidney injury Likely due to poor oral intake. Renal ultrasound unremarkable for any acute abnormality. We will continue to closely monitor ins and outs and daily weight. Patient was started on D10 which I will change to D5. Monitor BMP every 4 hours. Sodium level getting better. Patient was also given LR bolus. Unable to place a PICC line due to positive blood cultures, will recheck the culture tomorrow.  3. Acute kidney injury. Chronic kidney disease stage II. Renal function significantly  worsened from baseline. Getting better with aggressive IV hydration. Neck*monitor ins and outs and avoid nephrotoxic medications.  unfortunately the best option to continue for the antibiotics currently is vancomycin and Zosyn. We will monitor.  4. Dementia with behavioral disturbances. It has been reported as up at her baseline the patient is usually alert and oriented to self and is able to make her wants and needs known. It appears that the patient has returned back to her baseline based on my evaluation. We'll monitor.  5. Recent DVT in January 2018. Patient was on Xarelto, currently switched to happen due to nothing by mouth status.  will probably resume Xarelto once surgery has no plan for surgical debridement of her ulcer.  6. Stage IV sacral ulcer with foul-smelling General surgery consulted, recommends MRI of the sacrum to rule out any osteomyelitis. Also recommends may require that side debridement of the sacrum. They have recommended palliative care consult, placed  7. Dysphagia. Speech therapy consulted, dysphagia type II diet recommended.  Bowel regimen: last BM prior to admission Diet: Dysphagia type II diet DVT Prophylaxis: on therapeutic anticoagulation.  Advance goals of care discussion: Currently full code, palliative care consulted  Family Communication: Left voice mail on mobile contact number of Son.   Disposition:  To be decided  Consultants: Gen. surgery, palliative care Procedures: none  Antibiotics: Anti-infectives    Start     Dose/Rate Route Frequency Ordered Stop   12/12/16 0800  vancomycin (VANCOCIN) 500 mg in sodium chloride 0.9 % 100 mL IVPB     500 mg 100 mL/hr over 60 Minutes Intravenous Every 48 hours 12/10/16 0925     12/10/16 1400  piperacillin-tazobactam (ZOSYN) IVPB 2.25 g  2.25 g 100 mL/hr over 30 Minutes Intravenous Every 6 hours 12/10/16 0925     12/10/16 0745  piperacillin-tazobactam (ZOSYN) IVPB 3.375 g     3.375 g 100 mL/hr  over 30 Minutes Intravenous  Once 12/10/16 0735 12/10/16 0826   12/10/16 0745  vancomycin (VANCOCIN) IVPB 1000 mg/200 mL premix     1,000 mg 200 mL/hr over 60 Minutes Intravenous  Once 12/10/16 0735 12/10/16 0856       Objective: Physical Exam: Vitals:   12/11/16 0023 12/11/16 0441 12/11/16 0743 12/11/16 1200  BP: (!) 102/59 (!) 102/58 (!) 105/58 107/62  Pulse: 97 (!) 59 66 66  Resp: 19 (!) 22 (!) 22 20  Temp: (!) 96.5 F (35.8 C) 97 F (36.1 C) 97.5 F (36.4 C) 97.8 F (36.6 C)  TempSrc: Axillary Axillary Axillary Axillary  SpO2: 100% 100% 100% 100%  Weight:  44 kg (97 lb)    Height:        Intake/Output Summary (Last 24 hours) at 12/11/16 1629 Last data filed at 12/11/16 1255  Gross per 24 hour  Intake             1645 ml  Output                0 ml  Net             1645 ml   Filed Weights   12/10/16 0727 12/10/16 1137 12/11/16 0441  Weight: 47.6 kg (105 lb) 43.6 kg (96 lb 1.9 oz) 44 kg (97 lb)   General: Alert, Awake and Oriented to Place and Person. Appear in moderate distress, affect appropriate Eyes: PERRL, Conjunctiva normal ENT: Oral Mucosa clear dry Neck: difficult to assess JVD, no Abnormal Mass Or lumps Cardiovascular: S1 and S2 Present, aortic systolic Murmur, Respiratory: Bilateral Air entry equal and Decreased, no use of accessory muscle, Clear to Auscultation, no Crackles, no wheezes Abdomen: Bowel Sound present, Soft and no tenderness Skin: no redness, no Rash, no induration Extremities: no Pedal edema, no calf tenderness Neurologic: Grossly no focal neuro deficit, generalized lethargy and weakness.  Data Reviewed: CBC:  Recent Labs Lab 12/10/16 0730 12/11/16 0255  WBC 19.9* 14.8*  NEUTROABS 16.9*  --   HGB 12.5 8.9*  HCT 44.7 31.8*  MCV 100.0 100.0  PLT 219 756*   Basic Metabolic Panel:  Recent Labs Lab 12/10/16 0730 12/10/16 1005 12/10/16 1332 12/10/16 1608 12/10/16 2136 12/11/16 0255 12/11/16 0759  NA 179*  --   --  175* 172*  169* 167*  K 3.8  --   --  2.8* 3.6 3.2* 2.7*  CL 126*  --   --  >130* >130* 130* 127*  CO2 26  --   --  25 24 25 24   GLUCOSE 138*  --   --  158* 297* 304* 330*  BUN 213*  --   --  196* 188* 183* 180*  CREATININE 4.86* 4.41*  --  4.34* 4.20* 4.12* 3.85*  CALCIUM 8.2*  --   --  7.5* 7.4* 7.5* 7.6*  MG  --   --  2.9*  --   --  2.6*  --   PHOS  --   --  5.2*  --   --  4.2  --     Liver Function Tests:  Recent Labs Lab 12/10/16 0730 12/11/16 0255  AST 38 26  ALT 20 18  ALKPHOS 68 56  BILITOT 0.9 0.6  PROT 7.3 5.4*  ALBUMIN 2.7* 1.9*  Recent Labs Lab 12/10/16 0730  LIPASE 55*    Recent Labs Lab 12/10/16 1927  AMMONIA 26   Coagulation Profile:  Recent Labs Lab 12/10/16 0730  INR 1.35   Cardiac Enzymes: No results for input(s): CKTOTAL, CKMB, CKMBINDEX, TROPONINI in the last 168 hours. BNP (last 3 results) No results for input(s): PROBNP in the last 8760 hours. CBG: No results for input(s): GLUCAP in the last 168 hours. Studies: No results found.  Scheduled Meds: . chlorhexidine  15 mL Mouth Rinse BID  . mouth rinse  15 mL Mouth Rinse q12n4p  . piperacillin-tazobactam (ZOSYN)  IV  2.25 g Intravenous Q6H  . potassium chloride (KCL MULTIRUN) 30 mEq in 265 mL IVPB  30 mEq Intravenous Once  . sodium chloride flush  3 mL Intravenous Q12H  . sodium hypochlorite   Irrigation BID  . valproate sodium  250 mg Intravenous Q8H  . [START ON 12/12/2016] vancomycin  500 mg Intravenous Q48H   Continuous Infusions: . dextrose 75 mL/hr at 12/11/16 0928  . heparin 600 Units/hr (12/11/16 0928)   PRN Meds: acetaminophen **OR** acetaminophen, RESOURCE THICKENUP CLEAR  Time spent: 35 minutes  Author: Berle Mull, MD [facility] Pager: [phone] 12/11/2016 4:29 PM  If 7PM-7AM, please contact night-coverage at [email or web address], password [facility]

## 2016-12-12 ENCOUNTER — Inpatient Hospital Stay (HOSPITAL_COMMUNITY): Payer: Medicare Other

## 2016-12-12 DIAGNOSIS — Z7189 Other specified counseling: Secondary | ICD-10-CM

## 2016-12-12 DIAGNOSIS — Z515 Encounter for palliative care: Secondary | ICD-10-CM

## 2016-12-12 DIAGNOSIS — A419 Sepsis, unspecified organism: Secondary | ICD-10-CM

## 2016-12-12 LAB — CBC WITH DIFFERENTIAL/PLATELET
BASOS ABS: 0 10*3/uL (ref 0.0–0.1)
Basophils Relative: 0 %
EOS ABS: 0 10*3/uL (ref 0.0–0.7)
Eosinophils Relative: 0 %
HEMATOCRIT: 33.1 % — AB (ref 36.0–46.0)
HEMOGLOBIN: 9.5 g/dL — AB (ref 12.0–15.0)
Lymphocytes Relative: 14 %
Lymphs Abs: 2 10*3/uL (ref 0.7–4.0)
MCH: 28 pg (ref 26.0–34.0)
MCHC: 28.7 g/dL — AB (ref 30.0–36.0)
MCV: 97.6 fL (ref 78.0–100.0)
Monocytes Absolute: 0.6 10*3/uL (ref 0.1–1.0)
Monocytes Relative: 4 %
NEUTROS ABS: 11.6 10*3/uL — AB (ref 1.7–7.7)
Neutrophils Relative %: 82 %
Platelets: 107 10*3/uL — ABNORMAL LOW (ref 150–400)
RBC: 3.39 MIL/uL — ABNORMAL LOW (ref 3.87–5.11)
RDW: 16.3 % — AB (ref 11.5–15.5)
WBC: 14.2 10*3/uL — ABNORMAL HIGH (ref 4.0–10.5)

## 2016-12-12 LAB — COMPREHENSIVE METABOLIC PANEL
ALBUMIN: 1.9 g/dL — AB (ref 3.5–5.0)
ALK PHOS: 66 U/L (ref 38–126)
ALT: 16 U/L (ref 14–54)
AST: 26 U/L (ref 15–41)
Anion gap: 12 (ref 5–15)
BILIRUBIN TOTAL: 0.5 mg/dL (ref 0.3–1.2)
BUN: 148 mg/dL — AB (ref 6–20)
CO2: 19 mmol/L — AB (ref 22–32)
Calcium: 8 mg/dL — ABNORMAL LOW (ref 8.9–10.3)
Chloride: 125 mmol/L — ABNORMAL HIGH (ref 101–111)
Creatinine, Ser: 3.21 mg/dL — ABNORMAL HIGH (ref 0.44–1.00)
GFR calc Af Amer: 16 mL/min — ABNORMAL LOW (ref >60)
GFR calc non Af Amer: 14 mL/min — ABNORMAL LOW (ref >60)
Glucose, Bld: 162 mg/dL — ABNORMAL HIGH (ref 65–99)
POTASSIUM: 3.4 mmol/L — AB (ref 3.5–5.1)
SODIUM: 156 mmol/L — AB (ref 135–145)
TOTAL PROTEIN: 5.7 g/dL — AB (ref 6.5–8.1)

## 2016-12-12 LAB — BASIC METABOLIC PANEL
ANION GAP: 12 (ref 5–15)
Anion gap: 13 (ref 5–15)
BUN: 154 mg/dL — ABNORMAL HIGH (ref 6–20)
BUN: 127 mg/dL — ABNORMAL HIGH (ref 6–20)
CALCIUM: 7.9 mg/dL — AB (ref 8.9–10.3)
CO2: 20 mmol/L — ABNORMAL LOW (ref 22–32)
CO2: 18 mmol/L — AB (ref 22–32)
CREATININE: 3.36 mg/dL — AB (ref 0.44–1.00)
Calcium: 7.7 mg/dL — ABNORMAL LOW (ref 8.9–10.3)
Chloride: 124 mmol/L — ABNORMAL HIGH (ref 101–111)
Chloride: 122 mmol/L — ABNORMAL HIGH (ref 101–111)
Creatinine, Ser: 2.83 mg/dL — ABNORMAL HIGH (ref 0.44–1.00)
GFR calc Af Amer: 18 mL/min — ABNORMAL LOW (ref >60)
GFR calc non Af Amer: 16 mL/min — ABNORMAL LOW (ref >60)
GFR, EST AFRICAN AMERICAN: 15 mL/min — AB (ref >60)
GFR, EST NON AFRICAN AMERICAN: 13 mL/min — AB (ref >60)
GLUCOSE: 137 mg/dL — AB (ref 65–99)
Glucose, Bld: 185 mg/dL — ABNORMAL HIGH (ref 65–99)
POTASSIUM: 3.2 mmol/L — AB (ref 3.5–5.1)
Potassium: 2.8 mmol/L — ABNORMAL LOW (ref 3.5–5.1)
SODIUM: 157 mmol/L — AB (ref 135–145)
Sodium: 152 mmol/L — ABNORMAL HIGH (ref 135–145)

## 2016-12-12 LAB — MAGNESIUM: Magnesium: 2.3 mg/dL (ref 1.7–2.4)

## 2016-12-12 LAB — URINE CULTURE

## 2016-12-12 LAB — LACTIC ACID, PLASMA
Lactic Acid, Venous: 2.9 mmol/L (ref 0.5–1.9)
Lactic Acid, Venous: 3.2 mmol/L (ref 0.5–1.9)
Lactic Acid, Venous: 3.3 mmol/L (ref 0.5–1.9)

## 2016-12-12 LAB — PROTIME-INR
INR: 1.14
PROTHROMBIN TIME: 14.6 s (ref 11.4–15.2)

## 2016-12-12 LAB — APTT: aPTT: 64 seconds — ABNORMAL HIGH (ref 24–36)

## 2016-12-12 LAB — HEPARIN LEVEL (UNFRACTIONATED): Heparin Unfractionated: 0.39 IU/mL (ref 0.30–0.70)

## 2016-12-12 MED ORDER — LACTATED RINGERS IV BOLUS (SEPSIS)
1000.0000 mL | Freq: Once | INTRAVENOUS | Status: AC
  Administered 2016-12-12: 1000 mL via INTRAVENOUS

## 2016-12-12 MED ORDER — DEXTROSE 5 % IV SOLN
1.0000 g | INTRAVENOUS | Status: DC
  Administered 2016-12-12 – 2016-12-14 (×3): 1 g via INTRAVENOUS
  Filled 2016-12-12 (×4): qty 1

## 2016-12-12 MED ORDER — LACTATED RINGERS IV BOLUS (SEPSIS)
500.0000 mL | Freq: Once | INTRAVENOUS | Status: AC
  Administered 2016-12-12: 500 mL via INTRAVENOUS

## 2016-12-12 MED ORDER — SODIUM CHLORIDE 0.9 % IV BOLUS (SEPSIS)
500.0000 mL | Freq: Once | INTRAVENOUS | Status: AC
  Administered 2016-12-12: 500 mL via INTRAVENOUS

## 2016-12-12 NOTE — Consult Note (Signed)
Consultation Note Date: 12/12/2016   Patient Name: Tara Shaffer  DOB: 1946/04/05  MRN: [id number]  Age / Sex: 71 y.o., female  PCP: Biagio Borg, MD Referring Physician: Lavina Hamman, MD  Reason for Consultation: Establishing goals of care  HPI/Patient Profile: 71 y.o. female  with past medical history of dementia, HTN, decubitis, anxiety w/ depression, adnexal mass, alcohol abuse, melanoma, DJD- cervical spine, GERD, HLD, IBS, compression fractures, CKD stage II, urinary retention, L femur intertrochanteric fracture (was being treated conservatively w/ Ultram '50mg'$  q8hrs), chronic left femoral DVT admitted on 12/10/2016 from [place] facility after being found unresponsive. Required bipap in ED. Now back to baseline. Workup thus far reveals sepsis and hypernatremia, AKI, psuedomonas UTI, BC + for coag neg staph, surgical consult for Stage IV decubitis- MRI for possible osteomyelitis pending. Palliative medicine consulted for [place].  Clinical Assessment and Goals of Care: Met with patient at bedside. Frail elderly lady who appears much older than her stated age. She is able to tell me her name and that she is in the hospital. When I ask her where she lives she states "[place]". When I ask her why she thinks she is in the hospital she stares at me blankly. I ask her what she enjoys doing at her home and she answers "not much". She asks for a drink and is able to take several sips of coke. She is unable to participate in any meaningful [place] conversation. She tells me her husband "Tara Shaffer" and son "Tara Shaffer" are her decision makers.  I called Tara Shaffer who is listed as her HCPOA and requested a return call to set up a meeting time. She does not complain of any pain or discomfort.  Primary Decision Maker HCPOA - son- Tara Shaffer    SUMMARY OF RECOMMENDATIONS -Family GOC with HCPOA, sonVicente Shaffer  Code Status/Advance  Care Planning:  Full code  Palliative Prophylaxis:   Delirium Protocol  Additional Recommendations (Limitations, Scope, Preferences):  Full Scope Treatment  Prognosis:    Unable to determine  Discharge Planning: To Be Determined  Primary Diagnoses: Present on Admission: . Anemia of chronic disease . ARF (acute renal failure) ([place]) . CKD (chronic kidney disease) stage 2, GFR 60-89 ml/min . Dementia with behavioral disturbance . Essential hypertension . GERD . HLD (hyperlipidemia) . Pressure ulcer . UTI (urinary tract infection) . Acute encephalopathy . Hypernatremia . Sepsis ([place]) . DVT, femoral, chronic ([place]) . Protein-calorie malnutrition, severe . Adnexal mass . Respiratory distress   I have reviewed the medical record, interviewed the patient and family, and examined the patient. The following aspects are pertinent.  Past Medical History:  Diagnosis Date  . Adnexal mass   . Alcohol abuse 02/27/2012  . ALLERGIC RHINITIS 06/23/2007  . ANXIETY 06/23/2007  . ANXIETY DEPRESSION 06/12/2009  . Cancer ([place])    skin cx/ hx melanoma  . COLONIC POLYPS, HX OF 03/08/2002  . 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 Dementia   . DIVERTICULOSIS, COLON 08/04/2007  . GERD 06/23/2007  .  HYPERLIPIDEMIA 06/24/2007  . HYPERTENSION 06/23/2007  . Irritable bowel syndrome 06/23/2007  . LOW BACK PAIN 06/24/2007  . MELANOMA, HX OF 08/04/2007  . Memory dysfunction 06/23/2011   Social History   Social History  . Marital status: Married    Spouse name: [name]  . Number of children: [name]  . Years of education: [name]   Social History Main Topics  . Smoking status: Never Smoker  . Smokeless tobacco: Never Used  . Alcohol use 25.2 oz/week    42 Glasses of wine per week  . Drug use: No  . Sexual activity: Yes    Birth control/ protection: Post-menopausal   Other Topics Concern  . None   Social History Narrative  . None   Family History  Problem Relation Age of Onset  .  Colon cancer Cousin   . Hypertension Other   . Diabetes Other    Scheduled Meds: . chlorhexidine  15 mL Mouth Rinse BID  . lactated ringers  500 mL Intravenous Once  . mouth rinse  15 mL Mouth Rinse q12n4p  . sodium chloride flush  3 mL Intravenous Q12H  . sodium hypochlorite   Irrigation BID  . valproate sodium  250 mg Intravenous Q8H  . vancomycin  500 mg Intravenous Q48H   Continuous Infusions: . dextrose 75 mL/hr at 12/12/16 0727  . heparin 700 Units/hr (12/11/16 1728)   PRN Meds:.acetaminophen **OR** acetaminophen, RESOURCE THICKENUP CLEAR Medications Prior to Admission:  Prior to Admission medications   Medication Sig Start Date End Date Taking? Authorizing Provider  acetaminophen (TYLENOL) 500 MG tablet Take 500 mg by mouth every 4 (four) hours as needed for mild pain.   Yes Historical Provider, MD  alum & mag hydroxide-simeth (MINTOX) 200-200-20 MG/5ML suspension Take 30 mLs by mouth every 6 (six) hours as needed for indigestion or heartburn.   Yes Historical Provider, MD  divalproex (DEPAKOTE SPRINKLE) 125 MG capsule Take 250 mg by mouth 3 (three) times daily.    Yes Historical Provider, MD  escitalopram (LEXAPRO) 10 MG tablet Take 10 mg by mouth daily.   Yes Historical Provider, MD  ferrous sulfate 325 (65 FE) MG tablet Take 1 tablet (325 mg total) by mouth 3 (three) times daily after meals. 03/17/15  Yes Shanker Levora Dredge, MD  folic acid (FOLVITE) 1 MG tablet Take 1 tablet (1 mg total) by mouth daily. 03/17/15  Yes Shanker Levora Dredge, MD  guaifenesin (ROBITUSSIN) 100 MG/5ML syrup Take 200 mg by mouth 3 (three) times daily as needed for cough.   Yes Historical Provider, MD  lanolin ointment Apply 1 application topically as needed for dry skin.   Yes Historical Provider, MD  magnesium hydroxide (MILK OF MAGNESIA) 400 MG/5ML suspension Take 30 mLs by mouth daily as needed for mild constipation.   Yes Historical Provider, MD  Memantine HCl-Donepezil HCl (NAMZARIC) 28-10 MG CP24 Take  1 capsule by mouth every morning.   Yes Historical Provider, MD  Multiple Vitamin (MULTIVITAMIN WITH MINERALS) TABS tablet Take 1 tablet by mouth daily. 03/17/15  Yes Shanker Levora Dredge, MD  QUEtiapine (SEROQUEL) 25 MG tablet Take 0.5 tablets (12.5 mg total) by mouth 2 (two) times daily. 08/26/16  Yes Rodolph Bong, MD  rivaroxaban (XARELTO) 20 MG TABS tablet Take 20 mg by mouth daily.   Yes Historical Provider, MD  thiamine 100 MG tablet Take 1 tablet (100 mg total) by mouth daily. 03/17/15  Yes Shanker Levora Dredge, MD  traMADol (ULTRAM) 50 MG tablet Take one  tablet by mouth every 8 hours for pain 08/27/16  Yes [name], DO  LORazepam (ATIVAN) 0.5 MG tablet Take 1 tablet (0.5 mg total) by mouth every 8 (eight) hours as needed for anxiety. Patient not taking: Reported on 12/10/2016 08/27/16   [name], DO   Allergies  Allergen Reactions  . Codeine Phosphate Nausea Only   Review of Systems  Unable to perform ROS: Dementia    Physical Exam  Constitutional:  frail  Eyes:  watery  Cardiovascular: Normal rate and regular rhythm.   Pulmonary/Chest: Effort normal.  Neurological: She is alert.  Oriented to person and place only  Skin: Skin is warm and dry.    Vital Signs: BP 105/67 (BP Location: Left Arm)   Pulse 66   Temp 97.7 F (36.5 C) (Axillary)   Resp (!) 23   Ht '5\' 7"'$  (1.702 m)   Wt 44 kg (97 lb)   SpO2 100%   BMI 15.19 kg/m  Pain Assessment: No/denies pain   Pain Score: 0-No pain   SpO2: SpO2: 100 % O2 Device:SpO2: 100 % O2 Flow Rate: .O2 Flow Rate (L/min): 6 L/min  IO: Intake/output summary:  Intake/Output Summary (Last 24 hours) at 12/12/16 1216 Last data filed at 12/12/16 5859  Gross per 24 hour  Intake          1997.68 ml  Output                6 ml  Net          1991.68 ml    LBM: Last BM Date: 12/11/16 Baseline Weight: Weight: 47.6 kg (105 lb) Most recent weight: Weight: 44 kg (97 lb)     Palliative Assessment/Data: PPS: 20%     Thank you  for this consult. Palliative medicine will continue to follow and assist as needed.   Time Total: 45 minutes Greater than 50%  of this time was spent counseling and coordinating care related to the above assessment and plan.  Signed by: Mariana Kaufman, AGNP-C Palliative Medicine    Please contact Palliative Medicine Team phone at [phone] for questions and concerns.  For individual provider: See Shea Evans

## 2016-12-12 NOTE — Plan of Care (Signed)
Problem: Nutrition: Goal: Adequate nutrition will be maintained Outcome: Progressing Pt educated on the importance of eating and drinking fluids. Pt agreeable to trying to eat and drink more often.

## 2016-12-12 NOTE — Progress Notes (Signed)
Pharmacy Antibiotic Note  Tara Shaffer is a 71 y.o. female admitted on 12/10/2016 with sepsis 2/2 stage 4 sacral decubitus w/ possible osteo, UTI.  Pharmacy has been consulted for vancomycin and ceftazidime dosing. Today is day 3 of antibiotics. Blood cultures grew MRSE, urine culture grew Pseudomonas. WBC trending down to 14.2, afeb. Patient admitted with severe AKI, SCr trending down to 3.21 with CrCl ~ 11.5 ml/min.  Plan: D/c zosyn Start ceftazidime 1g IV Q24H Continue vancomycin 500mg  IV Q48H.  Goal trough 15-20 mcg/mL.  Monitor renal function and clinical course Obtain vanc trough at steady state  Height: 5\' 7"  (170.2 cm) Weight: 97 lb (44 kg) IBW/kg (Calculated) : 61.6  Temp (24hrs), Avg:97.5 F (36.4 C), Min:97.2 F (36.2 C), Max:97.7 F (36.5 C)   Recent Labs Lab 12/10/16 0730 12/10/16 0743  12/10/16 1018 12/10/16 1332  12/11/16 0255 12/11/16 0759 12/11/16 1547 12/11/16 1914 12/12/16 0045 12/12/16 0428 12/12/16 0925  WBC 19.9*  --   --   --   --   --  14.8*  --   --   --   --  14.2*  --   CREATININE 4.86*  --   < >  --   --   < > 4.12* 3.85* 3.60* 3.33* 3.36* 3.21*  --   LATICACIDVEN  --  3.84*  --  1.53 1.7  --   --   --   --   --   --  2.9* 3.2*  < > = values in this interval not displayed.  Estimated Creatinine Clearance: 11.5 mL/min (A) (by C-G formula based on SCr of 3.21 mg/dL (H)).    Antimicrobials this admission:  Vancomycin 3/16>> Zosyn 3/16>>3/18 Ceftaz 3/18>>  Microbiology results:  3/16 BCx: 2/2 MRSE 3/16 UCx: >100K Pseudomonas (pan-sens) 3/16 MRSA PCR: neg   Gwenlyn Perking, PharmD PGY1 Pharmacy Resident Pager: [phone] 12/12/2016 2:34 PM

## 2016-12-12 NOTE — Progress Notes (Signed)
[facility] Progress Note  Patient: Tara Shaffer:[id number]   PCP: [name], MD DOB: 1945-10-14   DOA: 12/10/2016   DOS: 12/12/2016   Date of Service: the patient was seen and examined on 12/12/2016  Subjective: Remains awake, no acute issues overnight, denies any acute complaint at present, requesting to reposition in the bed. Unable to complete MRI due to contracture.  Brief hospital course: Pt. with PMH of dementia, HTN, sacral decubitus ulcer, recent DVT; admitted on 12/10/2016, with complaint of unresponsive episode, was found to have sepsis and hypernatremia. Currently further plan is further workup for above planned IV antibiotics.  Assessment and Plan: 1. Sepsis  Coagulase-negative bacteremia 2 out of 2 likely from sacral ulcer Pseudomonas UTI  Patient presented with an unresponsive episode. Patient was initially placed on BiPAP. Currently appears to be back to his baseline based on information provided in the ER notes. Blood cultures are +2 out of 2 for coag is negative staph, will continue vancomycin for that. Urine culture is positive for Pseudomonas, pansensitive, narrow antibiotics to [place]. Monitor cultures for sensitivity and narrowing down of the antibiotics. Continue stepdown monitoring. BiPAP when necessary PICC cultures performed today. Lactic acidosis is still an issue receiving LR bolus.  2. Hypernatremia. Acute kidney injury Likely due to poor oral intake. Renal ultrasound unremarkable for any acute abnormality. We will continue to closely monitor ins and outs and daily weight. Patient was started on D10 which I changed to D5. Monitor BMP Sodium level getting better. Patient was also given LR bolus. Unable to place a PICC line due to positive blood cultures, 3. Acute kidney injury. Chronic kidney disease stage II. Renal function significantly worsened from baseline. Getting better with aggressive IV hydration. Neck*monitor ins and outs and  avoid nephrotoxic medications.  unfortunately the best option to continue for the antibiotics currently is vancomycin and Zosyn. We will monitor.  4. Dementia with behavioral disturbances.   unresponsive episode It has been reported as up at her baseline the patient is usually alert and oriented to self and is able to make her wants and needs known. It appears that the patient has returned back to her baseline based on my evaluation. We'll monitor.  5. Recent DVT in January 2018. Patient was on Xarelto, currently switched to happen due to nothing by mouth status.  will probably resume Xarelto once surgery has no plan for surgical debridement of her ulcer.  6. Stage IV sacral ulcer with foul-smelling General surgery consulted, recommends MRI of the sacrum to rule out any osteomyelitis. Also recommends may require bedside debridement of the sacrum. They have recommended palliative care consult, placed  7. Dysphagia. Speech therapy consulted, dysphagia type II diet recommended.  Bowel regimen: last BM 12/11/2016 Diet: Dysphagia type II diet DVT Prophylaxis: on therapeutic anticoagulation.  Advance goals of care discussion: Currently full code, palliative care consulted  Family Communication: No family at bedside  Disposition:  To be decided  Consultants: Gen. surgery, palliative care Procedures: none  Antibiotics: Anti-infectives    Start     Dose/Rate Route Frequency Ordered Stop   12/12/16 0800  vancomycin (VANCOCIN) 500 mg in sodium chloride 0.9 % 100 mL IVPB     500 mg 100 mL/hr over 60 Minutes Intravenous Every 48 hours 12/10/16 0925     12/10/16 1400  piperacillin-tazobactam (ZOSYN) IVPB 2.25 g  Status:  Discontinued     2.25 g 100 mL/hr over 30 Minutes Intravenous Every 6 hours 12/10/16 0925 12/12/16 1153  12/10/16 0745  piperacillin-tazobactam (ZOSYN) IVPB 3.375 g     3.375 g 100 mL/hr over 30 Minutes Intravenous  Once 12/10/16 0735 12/10/16 0826   12/10/16 0745   vancomycin (VANCOCIN) IVPB 1000 mg/200 mL premix     1,000 mg 200 mL/hr over 60 Minutes Intravenous  Once 12/10/16 0735 12/10/16 0856       Objective: Physical Exam: Vitals:   12/11/16 1200 12/11/16 1900 12/11/16 2356 12/12/16 0543  BP: 107/62 (!) 88/66 127/70 105/67  Pulse: 66 64 66 66  Resp: 20 (!) 21 14 (!) 23  Temp: 97.8 F (36.6 C) 97.2 F (36.2 C) 97.7 F (36.5 C) 97.7 F (36.5 C)  TempSrc: Axillary Axillary Axillary Axillary  SpO2: 100% 100% 100% 100%  Weight:      Height:        Intake/Output Summary (Last 24 hours) at 12/12/16 1240 Last data filed at 12/12/16 0607  Gross per 24 hour  Intake          1997.68 ml  Output                6 ml  Net          1991.68 ml   Filed Weights   12/10/16 0727 12/10/16 1137 12/11/16 0441  Weight: 47.6 kg (105 lb) 43.6 kg (96 lb 1.9 oz) 44 kg (97 lb)   General: Alert, Awake and Oriented to Place and Person. Appear in moderate distress, affect appropriate Eyes: PERRL, Conjunctiva normal ENT: Oral Mucosa clear moist Neck: difficult to assess JVD, no Abnormal Mass Or lumps Cardiovascular: S1 and S2 Present, aortic systolic Murmur, Respiratory: Bilateral Air entry equal and Decreased, no use of accessory muscle, Clear to Auscultation, no Crackles, no wheezes Abdomen: Bowel Sound present, Soft and no tenderness Skin: no redness, no Rash, no induration, sacral ulcer present on admission Extremities: no Pedal edema, no calf tenderness Neurologic: Grossly no focal neuro deficit, generalized lethargy and weakness.  Data Reviewed: CBC:  Recent Labs Lab 12/10/16 0730 12/11/16 0255 12/12/16 0428  WBC 19.9* 14.8* 14.2*  NEUTROABS 16.9*  --  11.6*  HGB 12.5 8.9* 9.5*  HCT 44.7 31.8* 33.1*  MCV 100.0 100.0 97.6  PLT 219 107* 427*   Basic Metabolic Panel:  Recent Labs Lab 12/10/16 1332  12/11/16 0255 12/11/16 0759 12/11/16 1547 12/11/16 1914 12/12/16 0045 12/12/16 0428  NA  --   < > 169* 167* 162* 155* 157* 156*  K  --    < > 3.2* 2.7* 3.1* 2.8* 2.8* 3.4*  CL  --   < > 130* 127* 125* 120* 124* 125*  CO2  --   < > 25 24 21* 20* 20* 19*  GLUCOSE  --   < > 304* 330* 247* 207* 185* 162*  BUN  --   < > 183* 180* 164* 155* 154* 148*  CREATININE  --   < > 4.12* 3.85* 3.60* 3.33* 3.36* 3.21*  CALCIUM  --   < > 7.5* 7.6* 7.8* 7.6* 7.9* 8.0*  MG 2.9*  --  2.6*  --   --   --   --  2.3  PHOS 5.2*  --  4.2  --   --   --   --   --   < > = values in this interval not displayed.  Liver Function Tests:  Recent Labs Lab 12/10/16 0730 12/11/16 0255 12/12/16 0428  AST 38 26 26  ALT 20 18 16   ALKPHOS 68 56 66  BILITOT 0.9 0.6 0.5  PROT 7.3 5.4* 5.7*  ALBUMIN 2.7* 1.9* 1.9*    Recent Labs Lab 12/10/16 0730  LIPASE 55*    Recent Labs Lab 12/10/16 1927  AMMONIA 26   Coagulation Profile:  Recent Labs Lab 12/10/16 0730 12/12/16 0428  INR 1.35 1.14   Cardiac Enzymes: No results for input(s): CKTOTAL, CKMB, CKMBINDEX, TROPONINI in the last 168 hours. BNP (last 3 results) No results for input(s): PROBNP in the last 8760 hours. CBG: No results for input(s): GLUCAP in the last 168 hours. Studies: No results found.  Scheduled Meds: . chlorhexidine  15 mL Mouth Rinse BID  . lactated ringers  500 mL Intravenous Once  . mouth rinse  15 mL Mouth Rinse q12n4p  . sodium chloride flush  3 mL Intravenous Q12H  . sodium hypochlorite   Irrigation BID  . valproate sodium  250 mg Intravenous Q8H  . vancomycin  500 mg Intravenous Q48H   Continuous Infusions: . dextrose 75 mL/hr at 12/12/16 0727  . heparin 700 Units/hr (12/11/16 1728)   PRN Meds: acetaminophen **OR** acetaminophen, RESOURCE THICKENUP CLEAR  Time spent: 30 minutes  Author: Berle Mull, MD [facility] Pager: [phone] 12/12/2016 12:40 PM  If 7PM-7AM, please contact night-coverage at [email or web address], password [facility]

## 2016-12-12 NOTE — Progress Notes (Signed)
[place] for  Heparin Indication: h/o  DVT  Allergies  Allergen Reactions  . Codeine Phosphate Nausea Only    Patient Measurements: Height: 5\' 7"  (170.2 cm) Weight: 97 lb (44 kg) IBW/kg (Calculated) : 61.6  Vital Signs: Temp: 97.7 F (36.5 C) (03/17 2356) Temp Source: Axillary (03/17 2356) BP: 127/70 (03/17 2356) Pulse Rate: 66 (03/17 2356)  Labs:  Recent Labs  12/10/16 0730  12/11/16 0255  12/11/16 1547 12/11/16 1914 12/12/16 0045  HGB 12.5  --  8.9*  --   --   --   --   HCT 44.7  --  31.8*  --   --   --   --   PLT 219  --  107*  --   --   --   --   APTT  --   < > 30  --  45*  --  64*  LABPROT 16.7*  --   --   --   --   --   --   INR 1.35  --   --   --   --   --   --   HEPARINUNFRC  --   --  <0.10*  --  0.31  --  0.39  CREATININE 4.86*  < > 4.12*  < > 3.60* 3.33* 3.36*  < > = values in this interval not displayed.  Estimated Creatinine Clearance: 11 mL/min (A) (by C-G formula based on SCr of 3.36 mg/dL (H)).  Assessment: 70 y.o. female with h/o DVT, Xarelto on hold, for heparin  Goal of Therapy:  Monitor platelets by anticoagulation protocol: Yes  Heparin level = 0.3 to 0.7   Plan:  Continue Heparin at current rate   Phillis Knack, PharmD, BCPS  12/12/2016 2:08 AM

## 2016-12-13 ENCOUNTER — Encounter (HOSPITAL_COMMUNITY): Payer: Self-pay | Admitting: General Practice

## 2016-12-13 DIAGNOSIS — Z7401 Bed confinement status: Secondary | ICD-10-CM

## 2016-12-13 DIAGNOSIS — M4628 Osteomyelitis of vertebra, sacral and sacrococcygeal region: Secondary | ICD-10-CM | POA: Diagnosis present

## 2016-12-13 DIAGNOSIS — R1909 Other intra-abdominal and pelvic swelling, mass and lump: Secondary | ICD-10-CM

## 2016-12-13 DIAGNOSIS — L89619 Pressure ulcer of right heel, unspecified stage: Secondary | ICD-10-CM

## 2016-12-13 DIAGNOSIS — R8271 Bacteriuria: Secondary | ICD-10-CM

## 2016-12-13 DIAGNOSIS — L89154 Pressure ulcer of sacral region, stage 4: Secondary | ICD-10-CM

## 2016-12-13 DIAGNOSIS — B957 Other staphylococcus as the cause of diseases classified elsewhere: Secondary | ICD-10-CM | POA: Diagnosis present

## 2016-12-13 DIAGNOSIS — Z800 Family history of malignant neoplasm of digestive organs: Secondary | ICD-10-CM

## 2016-12-13 DIAGNOSIS — Z833 Family history of diabetes mellitus: Secondary | ICD-10-CM

## 2016-12-13 DIAGNOSIS — Z885 Allergy status to narcotic agent status: Secondary | ICD-10-CM

## 2016-12-13 DIAGNOSIS — L89629 Pressure ulcer of left heel, unspecified stage: Secondary | ICD-10-CM

## 2016-12-13 DIAGNOSIS — Z8249 Family history of ischemic heart disease and other diseases of the circulatory system: Secondary | ICD-10-CM

## 2016-12-13 DIAGNOSIS — R7881 Bacteremia: Secondary | ICD-10-CM | POA: Diagnosis present

## 2016-12-13 DIAGNOSIS — E43 Unspecified severe protein-calorie malnutrition: Secondary | ICD-10-CM

## 2016-12-13 LAB — BASIC METABOLIC PANEL
Anion gap: 12 (ref 5–15)
BUN: 111 mg/dL — AB (ref 6–20)
CALCIUM: 7.7 mg/dL — AB (ref 8.9–10.3)
CO2: 18 mmol/L — ABNORMAL LOW (ref 22–32)
CREATININE: 2.54 mg/dL — AB (ref 0.44–1.00)
Chloride: 121 mmol/L — ABNORMAL HIGH (ref 101–111)
GFR calc Af Amer: 21 mL/min — ABNORMAL LOW (ref >60)
GFR, EST NON AFRICAN AMERICAN: 18 mL/min — AB (ref >60)
GLUCOSE: 103 mg/dL — AB (ref 65–99)
Potassium: 2.9 mmol/L — ABNORMAL LOW (ref 3.5–5.1)
SODIUM: 151 mmol/L — AB (ref 135–145)

## 2016-12-13 LAB — BLOOD CULTURE ID PANEL (REFLEXED)
Acinetobacter baumannii: NOT DETECTED
CANDIDA ALBICANS: NOT DETECTED
CANDIDA PARAPSILOSIS: NOT DETECTED
CANDIDA TROPICALIS: NOT DETECTED
Candida glabrata: NOT DETECTED
Candida krusei: NOT DETECTED
Enterobacter cloacae complex: NOT DETECTED
Enterobacteriaceae species: NOT DETECTED
Enterococcus species: NOT DETECTED
Escherichia coli: NOT DETECTED
HAEMOPHILUS INFLUENZAE: NOT DETECTED
KLEBSIELLA OXYTOCA: NOT DETECTED
KLEBSIELLA PNEUMONIAE: NOT DETECTED
Listeria monocytogenes: NOT DETECTED
METHICILLIN RESISTANCE: DETECTED — AB
NEISSERIA MENINGITIDIS: NOT DETECTED
Proteus species: NOT DETECTED
Pseudomonas aeruginosa: NOT DETECTED
SERRATIA MARCESCENS: NOT DETECTED
STAPHYLOCOCCUS SPECIES: DETECTED — AB
STREPTOCOCCUS AGALACTIAE: NOT DETECTED
STREPTOCOCCUS PYOGENES: NOT DETECTED
Staphylococcus aureus (BCID): NOT DETECTED
Streptococcus pneumoniae: NOT DETECTED
Streptococcus species: NOT DETECTED

## 2016-12-13 LAB — CBC
HCT: 29.5 % — ABNORMAL LOW (ref 36.0–46.0)
Hemoglobin: 8.7 g/dL — ABNORMAL LOW (ref 12.0–15.0)
MCH: 28 pg (ref 26.0–34.0)
MCHC: 29.5 g/dL — AB (ref 30.0–36.0)
MCV: 94.9 fL (ref 78.0–100.0)
PLATELETS: 110 10*3/uL — AB (ref 150–400)
RBC: 3.11 MIL/uL — ABNORMAL LOW (ref 3.87–5.11)
RDW: 15.7 % — ABNORMAL HIGH (ref 11.5–15.5)
WBC: 10.5 10*3/uL (ref 4.0–10.5)

## 2016-12-13 LAB — CULTURE, BLOOD (ROUTINE X 2)

## 2016-12-13 LAB — HEPARIN LEVEL (UNFRACTIONATED): HEPARIN UNFRACTIONATED: 0.4 [IU]/mL (ref 0.30–0.70)

## 2016-12-13 LAB — MAGNESIUM: MAGNESIUM: 2 mg/dL (ref 1.7–2.4)

## 2016-12-13 MED ORDER — MUPIROCIN 2 % EX OINT
1.0000 "application " | TOPICAL_OINTMENT | Freq: Two times a day (BID) | CUTANEOUS | Status: DC
  Administered 2016-12-13 – 2016-12-15 (×5): 1 via NASAL
  Filled 2016-12-13: qty 22

## 2016-12-13 MED ORDER — SODIUM CHLORIDE 0.9 % IV SOLN
30.0000 meq | Freq: Once | INTRAVENOUS | Status: AC
  Administered 2016-12-13: 30 meq via INTRAVENOUS
  Filled 2016-12-13: qty 15

## 2016-12-13 MED ORDER — MUPIROCIN 2 % EX OINT: 1.0000 "application " | TOPICAL_OINTMENT | Freq: Two times a day (BID) | CUTANEOUS | Status: DC

## 2016-12-13 MED ORDER — CHLORHEXIDINE GLUCONATE CLOTH 2 % EX PADS
6.0000 | MEDICATED_PAD | Freq: Every day | CUTANEOUS | Status: DC
  Administered 2016-12-13 – 2016-12-15 (×3): 6 via TOPICAL

## 2016-12-13 MED ORDER — CHLORHEXIDINE GLUCONATE CLOTH 2 % EX PADS: 6.0000 | MEDICATED_PAD | Freq: Every day | CUTANEOUS | Status: DC

## 2016-12-13 MED ORDER — POTASSIUM CHLORIDE CRYS ER 20 MEQ PO TBCR
40.0000 meq | EXTENDED_RELEASE_TABLET | Freq: Once | ORAL | Status: AC
  Administered 2016-12-13: 40 meq via ORAL
  Filled 2016-12-13: qty 2

## 2016-12-13 NOTE — Progress Notes (Signed)
S: no new issues O: BP 116/78 (BP Location: Right Arm)   Pulse 72   Temp 98 F (36.7 C) (Axillary)   Resp (!) 22   Ht 5\' 7"  (1.702 m)   Wt 44 kg (97 lb)   SpO2 100%   BMI 15.19 kg/m  Skin: sacral area with 8cm opening, superior edge with 45mm wide dry eschar with some undermining. Spine immediately palpable with fibrinous exudate overlying, no purulence A/P 71 yo female with long standing sacral ulcer -recommend x2 daily packing, do not think she requires a debridement of eschar at this time

## 2016-12-13 NOTE — Clinical Social Work Note (Signed)
Clinical Social Work Assessment  Patient Details  Name: Tara Shaffer MRN: [id number] Date of Birth: July 02, 1946  Date of referral:  12/13/16               Reason for consult:  Discharge Planning                Permission sought to share information with:  Facility Art therapist granted to share information::  Yes, Verbal Permission Granted  Name::     Tara Shaffer  Agency::  [facility]  Relationship::  Son  Contact Information:  [phone]  Housing/Transportation Living arrangements for the past 2 months:  Combine of Information:  Adult Children Patient Interpreter Needed:  None Criminal Activity/Legal Involvement Pertinent to Current Situation/Hospitalization:  No - Comment as needed Significant Relationships:  Adult Children Lives with:  Facility Resident Do you feel safe going back to the place where you live?  Yes Need for family participation in patient care:  Yes (Comment)  Care giving concerns:  CSW received consult regarding discharge planning. CSW spoke with patient's son, Tara Shaffer. Patient resides at [facility] and will return at discharge. CSW to continue to follow and assist with discharge planning needs.   Social Worker assessment / plan:  CSW spoke with patient's son concerning return to SNF  Employment status:  Retired Forensic scientist:  Medicare PT Recommendations:  Not assessed at this time Information / Referral to community resources:  [place]  Patient/Family's Response to care:  Patient's son reported agreement with discharge plan back to [facility].   Patient/Family's Understanding of and Emotional Response to Diagnosis, Current Treatment, and Prognosis:   Patient's son expressed understanding of CSW role and discharge process. No questions/concerns about plan or treatment. He reports hoping to have an update from the hospital so he will know more about the patient's condition.   Emotional  Assessment Appearance:  Appears stated age Attitude/Demeanor/Rapport:  Unable to Assess Affect (typically observed):  Unable to Assess Orientation:  Oriented to Self Alcohol / Substance use:  Not Applicable Psych involvement (Current and /or in the community):  No (Comment)  Discharge Needs  Concerns to be addressed:  Care Coordination Readmission within the last 30 days:  No Current discharge risk:  None Barriers to Discharge:  Continued Medical Work up   [employer], [place] 12/13/2016, 2:35 PM

## 2016-12-13 NOTE — Progress Notes (Signed)
No charge note:  Called both Jazlyn Tippens and [name], Brooke Bonito today and left messages requesting return call to arrange family meeting.  Mariana Kaufman, AGNP-C Palliative Medicine  Please call Palliative Medicine team phone with any questions [phone]. For individual providers please see AMION.

## 2016-12-13 NOTE — Progress Notes (Signed)
PHARMACY - PHYSICIAN COMMUNICATION CRITICAL VALUE ALERT - BLOOD CULTURE IDENTIFICATION (BCID)  Results for orders placed or performed during the hospital encounter of 12/10/16  Blood Culture ID Panel (Reflexed) (Collected: 12/12/2016  4:30 AM)  Result Value Ref Range   Enterococcus species NOT DETECTED NOT DETECTED   Listeria monocytogenes NOT DETECTED NOT DETECTED   Staphylococcus species DETECTED (A) NOT DETECTED   Staphylococcus aureus NOT DETECTED NOT DETECTED   Methicillin resistance DETECTED (A) NOT DETECTED   Streptococcus species NOT DETECTED NOT DETECTED   Streptococcus agalactiae NOT DETECTED NOT DETECTED   Streptococcus pneumoniae NOT DETECTED NOT DETECTED   Streptococcus pyogenes NOT DETECTED NOT DETECTED   Acinetobacter baumannii NOT DETECTED NOT DETECTED   Enterobacteriaceae species NOT DETECTED NOT DETECTED   Enterobacter cloacae complex NOT DETECTED NOT DETECTED   Escherichia coli NOT DETECTED NOT DETECTED   Klebsiella oxytoca NOT DETECTED NOT DETECTED   Klebsiella pneumoniae NOT DETECTED NOT DETECTED   Proteus species NOT DETECTED NOT DETECTED   Serratia marcescens NOT DETECTED NOT DETECTED   Haemophilus influenzae NOT DETECTED NOT DETECTED   Neisseria meningitidis NOT DETECTED NOT DETECTED   Pseudomonas aeruginosa NOT DETECTED NOT DETECTED   Candida albicans NOT DETECTED NOT DETECTED   Candida glabrata NOT DETECTED NOT DETECTED   Candida krusei NOT DETECTED NOT DETECTED   Candida parapsilosis NOT DETECTED NOT DETECTED   Candida tropicalis NOT DETECTED NOT DETECTED    Name of physician (or Provider) Contacted: Dr. Marlowe Sax  Changes to prescribed antibiotics required: None patient already being treated for CONS bacteremia  Levester Fresh, PharmD, BCPS, BCCCP Clinical Pharmacist Clinical phone for 12/13/2016 from 7a-3:30p: [phone] If after 3:30p, please call main pharmacy at: [phone] 12/13/2016 9:50 AM

## 2016-12-13 NOTE — NC FL2 (Signed)
[place] [place] LEVEL OF CARE SCREENING TOOL     IDENTIFICATION  Patient Name: Tara Shaffer Birthdate: 22-Apr-1946 Sex: female Admission Date (Current Location): 12/10/2016  [facility] and [place] Number:  Herbalist and Address:  The [place], [place] [address] [place], [place] [address]      Provider Number: [id number]  Attending Physician Name and Address:  Lavina Hamman, MD  Relative Name and Phone Number:  Delfino Lovett, son, [phone]    Current Level of Care: Hospital Recommended Level of Care: [place] Prior Approval Number:    Date Approved/Denied:   PASRR Number: [id number]  Discharge Plan: SNF    Current Diagnoses: Patient Active Problem List   Diagnosis Date Noted  . 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 Coag negative Staphylococcus bacteremia 12/13/2016  . Hypernatremia 12/10/2016  . Adnexal mass   . Stage IV pressure ulcer of sacral region ([place]) 11/23/2016  . Intertrochanteric fracture of left hip ([place]) 08/27/2016  . Protein-calorie malnutrition, severe 08/23/2016  . ARF (acute renal failure) ([place]) 08/21/2016  . UTI (urinary tract infection) 12/24/2015  . Anemia of chronic disease 08/29/2015  . DVT, femoral, chronic ([place]) 03/20/2015  . Alcohol abuse 02/27/2012  . Dementia with behavioral disturbance 06/23/2011  . 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 DIVERTICULOSIS, COLON 08/04/2007  . MELANOMA, HX OF 08/04/2007  . HLD (hyperlipidemia) 06/24/2007  . Anxiety state 06/23/2007  . Essential hypertension 06/23/2007  . ALLERGIC RHINITIS 06/23/2007  . GERD 06/23/2007  . Irritable bowel syndrome 06/23/2007    Orientation RESPIRATION BLADDER Height & Weight     Self  O2 (Nasal cannula 2L) Incontinent Weight: 44 kg (97 lb) Height:  5\' 7"  (170.2 cm)  BEHAVIORAL SYMPTOMS/MOOD NEUROLOGICAL BOWEL NUTRITION STATUS      Incontinent Diet (Please see DC Summary)  AMBULATORY STATUS  COMMUNICATION OF NEEDS Skin   Extensive Assist Verbally PU Stage and Appropriate Care (Stage IV pressure injury on sacrum)                       Personal Care Assistance Level of Assistance  Bathing, Feeding, Dressing Bathing Assistance: Maximum assistance Feeding assistance: Limited assistance Dressing Assistance: Maximum assistance     Functional Limitations Info             SPECIAL CARE FACTORS FREQUENCY                       Contractures      Additional Factors Info  Code Status, Allergies, Isolation Precautions Code Status Info: Full Allergies Info: Codeine Phosphate     Isolation Precautions Info: MRSA     Current Medications (12/13/2016):  This is the current hospital active medication list Current Facility-Administered Medications  Medication Dose Route Frequency Provider Last Rate Last Dose  . acetaminophen (TYLENOL) tablet 650 mg  650 mg Oral Q6H PRN Radene Gunning, NP   650 mg at 12/12/16 2001   Or  . acetaminophen (TYLENOL) suppository 650 mg  650 mg Rectal Q6H PRN Radene Gunning, NP      . cefTAZidime (FORTAZ) 1 g in dextrose 5 % 50 mL IVPB  1 g Intravenous Q24H Lavina Hamman, MD   1 g at 12/12/16 1600  . chlorhexidine (PERIDEX) 0.12 % solution 15 mL  15 mL Mouth Rinse BID Elwin Mocha, MD   15 mL at 12/13/16 1000  . Chlorhexidine Gluconate Cloth 2 %  PADS 6 each  6 each Topical Q0600 Lavina Hamman, MD   6 each at 12/13/16 1419  . dextrose 5 % solution   Intravenous Continuous Lavina Hamman, MD 75 mL/hr at 12/12/16 2000    . heparin ADULT infusion 100 units/mL (25000 units/257mL sodium chloride 0.45%)  700 Units/hr Intravenous Continuous Romona Curls, RPH 7 mL/hr at 12/12/16 2001 700 Units/hr at 12/12/16 2001  . MEDLINE mouth rinse  15 mL Mouth Rinse q12n4p Elwin Mocha, MD   15 mL at 12/13/16 1230  . mupirocin ointment (BACTROBAN) 2 % 1 application  1 application Nasal BID Lavina Hamman, MD   1 application at 68/12/75 1418  . potassium  chloride 30 mEq in sodium chloride 0.9 % 265 mL (KCL MULTIRUN) IVPB  30 mEq Intravenous Once Lavina Hamman, MD   30 mEq at 12/13/16 1419  . potassium chloride SA (K-DUR,KLOR-CON) CR tablet 40 mEq  40 mEq Oral Once Lavina Hamman, MD      . [place]   Oral PRN Lavina Hamman, MD      . sodium chloride flush (NS) 0.9 % injection 3 mL  3 mL Intravenous Q12H Lezlie Octave Black, NP   3 mL at 12/13/16 1000  . valproate (DEPACON) 250 mg in dextrose 5 % 50 mL IVPB  250 mg Intravenous Q8H Lavina Hamman, MD   250 mg at 12/13/16 1419  . vancomycin (VANCOCIN) 500 mg in sodium chloride 0.9 % 100 mL IVPB  500 mg Intravenous Q48H Rebecka Apley, RPH   500 mg at 12/12/16 1700     Discharge Medications: Please see discharge summary for a list of discharge medications.  Relevant Imaging Results:  Relevant Lab Results:   Additional Information SSN [id number]  Benard Halsted, LCSWA

## 2016-12-13 NOTE — Progress Notes (Signed)
  Speech Language Pathology Treatment: Dysphagia  Patient Details Name: Tara Shaffer MRN: [id number] DOB: 1945-10-01 Today's Date: 12/13/2016 Time: 3151-7616 SLP Time Calculation (min) (ACUTE ONLY): 12 min  Assessment / Plan / Recommendation Clinical Impression  F/u after 3/17 swallow assessment.  Pt known to SLP services; significant decline since last year, when she was able to feed herself a regular diet.  Now with dependence on others for eating, requiring frequent repositioning to maximize safety.  Pt consumed limited amounts of thin and nectar-thick liquids today, with no overt s/s of aspiration.  Confusion persists.  She is alert, communicative, but states she was living alone, driving, and independent with care PTA.  Recommend allowing thin liquids - may use a straw - with cautious rate of intake; should be as upright as possible.  Dependency on others for feeding is an independent risk factor for aspiration pna.  SLP will follow.    HPI HPI: Tara Shaffer a 71 y.o.female. Patient brought in by EMS from nursing home for acute mental status change, admitted per MD secondary to what appears to be significant dementia, behavioral disturbances, likely sepsis. Pt has history of alcohol associated dementia with behavioral disturbance, hx of GERD. Head CT impression 12/10/16: No acute intracranial abnormality, atrophy, chronic microvascular disease. CXR 12/10/16 Prior granulomatous disease. No acute abnormality noted. Bedside swallowing evaluation 12/24/15 during prior admission for UTI, regular diet with thin liquids recommended with no SLP follow-up warranted.      SLP Plan  Continue with current plan of care       Recommendations  Diet recommendations: Dysphagia 2 (fine chop);Nectar-thick liquid Liquids provided via: Straw Medication Administration: Whole meds with puree Supervision: Trained caregiver to feed patient Compensations: Slow rate;Small sips/bites Postural  Changes and/or Swallow Maneuvers:  (upright as much as possible)                Oral Care Recommendations: Oral care BID Follow up Recommendations:  (tbd) SLP Visit Diagnosis: Dysphagia, unspecified (R13.10) Plan: Continue with current plan of care       GO               Tara Shaffer, [place] CCC/SLP Pager [phone]  Tara Shaffer 12/13/2016, 10:50 AM

## 2016-12-13 NOTE — Progress Notes (Signed)
ANTICOAGULATION CONSULT NOTE - Follow Up Consult  Pharmacy Consult for Heparin Indication: DVT  (10/21/16)  Allergies  Allergen Reactions  . Codeine Phosphate Nausea Only    Patient Measurements: Height: 5\' 7"  (170.2 cm) Weight: 97 lb (44 kg) IBW/kg (Calculated) : 61.6 Heparin Dosing Weight: 44 kg  Vital Signs: Temp: 98 F (36.7 C) (03/19 1110) Temp Source: Axillary (03/19 1110) BP: 116/78 (03/19 1110) Pulse Rate: 72 (03/19 1110)  Labs:  Recent Labs  12/11/16 0255  12/11/16 1547  12/12/16 0045 12/12/16 0428 12/12/16 1532 12/13/16 0315  HGB 8.9*  --   --   --   --  9.5*  --  8.7*  HCT 31.8*  --   --   --   --  33.1*  --  29.5*  PLT 107*  --   --   --   --  107*  --  110*  APTT 30  --  45*  --  64*  --   --   --   LABPROT  --   --   --   --   --  14.6  --   --   INR  --   --   --   --   --  1.14  --   --   HEPARINUNFRC <0.10*  --  0.31  --  0.39  --   --  0.40  CREATININE 4.12*  < > 3.60*  < > 3.36* 3.21* 2.83* 2.54*  < > = values in this interval not displayed.  Estimated Creatinine Clearance: 14.5 mL/min (A) (by C-G formula based on SCr of 2.54 mg/dL (H)).  Assessment:  71 yr old female continues on IV heparin for hx DVT 10/21/16.  Xarelto on hold.  Heparin level remains therapeutic (0.40) on 700 units/hr.  Hemoglobin and platelet count low.  RN reports dark stools, not new.  Goal of Therapy:  Heparin level 0.3-0.7 units/ml Monitor platelets by anticoagulation protocol: Yes   Plan:   Continue heparin drip at 700 units/hr.  Daily heparin level and CBC.  Monitor for any bleeding.  Add FOB checks?  Arty Baumgartner, [place] Pager: [phone] 12/13/2016,11:13 AM

## 2016-12-13 NOTE — Progress Notes (Addendum)
[facility] Progress Note  Patient: Tara Shaffer TIR:[id number]   PCP: [name], MD DOB: April 13, 1946   DOA: 12/10/2016   DOS: 12/13/2016   Date of Service: the patient was seen and examined on 12/13/2016  Subjective: Remains awake, no acute issues overnight, denies any acute complaint at present, requesting to Talk to her husband  Brief hospital course: Pt. with PMH of dementia, HTN, sacral decubitus ulcer, recent DVT; admitted on 12/10/2016, with complaint of unresponsive episode, was found to have sepsis and hypernatremia. Blood cultures 2 out of 2 positive for staph hominis and epidermis. Repeat blood cultures also positive for staph epidermidis needed ID consulted. Urine culture positive for Pseudomonas. Currently further plan is further workup for above planned IV antibiotics.  Assessment and Plan: 1. 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 Patient was initially placed on BiPAP. Currently appears to be back to his baseline based on information provided in the ER notes. Blood cultures are +2 out of 2 for coag is negative staph, will continue vancomycin for that. Urine culture is positive for Pseudomonas, pansensitive, narrow antibiotics to [place]. Changed from stepdown to telemetry Repeat cultures 1 out of 2 positive for coagulase-negative staph. Lactic acidosis is still an issue receiving LR bolus. Rectal tube will be inserted to avoid contamination of the sacral wound. May also require a Foley catheter. Surgery initially felt that the patient may require debridement, but today they feel that the patient will just require wound care and no debridement. There is still concern for osteomyelitis, infectious diseases consulted for further help in managing the patient. Unable to complete MRI due to patient's posture.  2. Hypernatremia. Acute  kidney injury Likely due to poor oral intake. Renal ultrasound unremarkable for any acute abnormality. We will continue to closely monitor ins and outs and daily weight. Patient was started on D10 which I changed to D5. Monitor BMP Sodium level getting better. Patient was also given LR bolus. Unable to place a PICC line due to positive blood cultures.  3. Acute kidney injury. Chronic kidney disease stage II. Renal function significantly worsened from baseline. Getting better with aggressive IV hydration.  We will monitor.  4. Dementia with behavioral disturbances.   unresponsive episode It has been reported as up at her baseline the patient is usually alert and oriented to self and is able to make her wants and needs known. It appears that the patient has returned back to her baseline based on my evaluation. We'll monitor.  5. Recent DVT in January 2018. Patient was on Xarelto, currently switched to happen due to nothing by mouth status.  will probably resume Xarelto once surgery has no plan for surgical debridement of her ulcer.  6. Stage IV sacral ulcer with foul-smelling General surgery consulted, recommends MRI of the sacrum to rule out any osteomyelitis. Also recommends may require bedside debridement of the sacrum. They have recommended palliative care consult, placed  7. Dysphagia. Speech therapy consulted, dysphagia type II diet recommended.  8. Hypokalemia. Replacing and rechecking tomorrow.  Bowel regimen: last BM 12/13/2016 Diet: Dysphagia type II diet DVT Prophylaxis: on therapeutic anticoagulation.  Advance goals of care discussion: Currently full code, palliative care consulted, multiple attempts to reach family member has remained unsuccessful.  Family Communication: No family at bedside  Disposition:  To be decided  Consultants: Gen. surgery, palliative care, infectious disease Procedures: none  Antibiotics: Anti-infectives  Start     Dose/Rate  Route Frequency Ordered Stop   12/12/16 1500  cefTAZidime (FORTAZ) 1 g in dextrose 5 % 50 mL IVPB     1 g 100 mL/hr over 30 Minutes Intravenous Every 24 hours 12/12/16 1431     12/12/16 0800  vancomycin (VANCOCIN) 500 mg in sodium chloride 0.9 % 100 mL IVPB     500 mg 100 mL/hr over 60 Minutes Intravenous Every 48 hours 12/10/16 0925     12/10/16 1400  piperacillin-tazobactam (ZOSYN) IVPB 2.25 g  Status:  Discontinued     2.25 g 100 mL/hr over 30 Minutes Intravenous Every 6 hours 12/10/16 0925 12/12/16 1153   12/10/16 0745  piperacillin-tazobactam (ZOSYN) IVPB 3.375 g     3.375 g 100 mL/hr over 30 Minutes Intravenous  Once 12/10/16 0735 12/10/16 0826   12/10/16 0745  vancomycin (VANCOCIN) IVPB 1000 mg/200 mL premix     1,000 mg 200 mL/hr over 60 Minutes Intravenous  Once 12/10/16 0735 12/10/16 0856       Objective: Physical Exam: Vitals:   12/13/16 0235 12/13/16 0335 12/13/16 0742 12/13/16 1110  BP: 116/72 114/66 127/72 116/78  Pulse: 65 62 63 72  Resp: 20 19 19  (!) 22  Temp:  98.4 F (36.9 C) 97.8 F (36.6 C) 98 F (36.7 C)  TempSrc:  Axillary Axillary Axillary  SpO2: 100% 100% 100% 100%  Weight:      Height:        Intake/Output Summary (Last 24 hours) at 12/13/16 1542 Last data filed at 12/13/16 1220  Gross per 24 hour  Intake          2590.67 ml  Output                0 ml  Net          2590.67 ml   Filed Weights   12/10/16 0727 12/10/16 1137 12/11/16 0441  Weight: 47.6 kg (105 lb) 43.6 kg (96 lb 1.9 oz) 44 kg (97 lb)   General: Alert, Awake and Oriented to Place and Person. Appear in moderate distress, affect appropriate Eyes: PERRL, Conjunctiva normal ENT: Oral Mucosa clear moist Neck: difficult to assess JVD, no Abnormal Mass Or lumps Cardiovascular: S1 and S2 Present, aortic systolic Murmur, Respiratory: Bilateral Air entry equal and Decreased, no use of accessory muscle, Clear to Auscultation, no Crackles, no wheezes Abdomen: Bowel Sound present, Soft and  no tenderness Skin: no redness, no Rash, no induration, sacral ulcer present on admission Extremities: no Pedal edema, no calf tenderness Neurologic: Grossly no focal neuro deficit, generalized lethargy and weakness.  Data Reviewed: CBC:  Recent Labs Lab 12/10/16 0730 12/11/16 0255 12/12/16 0428 12/13/16 0315  WBC 19.9* 14.8* 14.2* 10.5  NEUTROABS 16.9*  --  11.6*  --   HGB 12.5 8.9* 9.5* 8.7*  HCT 44.7 31.8* 33.1* 29.5*  MCV 100.0 100.0 97.6 94.9  PLT 219 107* 107* 915*   Basic Metabolic Panel:  Recent Labs Lab 12/10/16 1332  12/11/16 0255  12/11/16 1914 12/12/16 0045 12/12/16 0428 12/12/16 1532 12/13/16 0315  NA  --   < > 169*  < > 155* 157* 156* 152* 151*  K  --   < > 3.2*  < > 2.8* 2.8* 3.4* 3.2* 2.9*  CL  --   < > 130*  < > 120* 124* 125* 122* 121*  CO2  --   < > 25  < > 20* 20* 19* 18* 18*  GLUCOSE  --   < >  304*  < > 207* 185* 162* 137* 103*  BUN  --   < > 183*  < > 155* 154* 148* 127* 111*  CREATININE  --   < > 4.12*  < > 3.33* 3.36* 3.21* 2.83* 2.54*  CALCIUM  --   < > 7.5*  < > 7.6* 7.9* 8.0* 7.7* 7.7*  MG 2.9*  --  2.6*  --   --   --  2.3  --  2.0  PHOS 5.2*  --  4.2  --   --   --   --   --   --   < > = values in this interval not displayed.  Liver Function Tests:  Recent Labs Lab 12/10/16 0730 12/11/16 0255 12/12/16 0428  AST 38 26 26  ALT 20 18 16   ALKPHOS 68 56 66  BILITOT 0.9 0.6 0.5  PROT 7.3 5.4* 5.7*  ALBUMIN 2.7* 1.9* 1.9*    Recent Labs Lab 12/10/16 0730  LIPASE 55*    Recent Labs Lab 12/10/16 1927  AMMONIA 26   Coagulation Profile:  Recent Labs Lab 12/10/16 0730 12/12/16 0428  INR 1.35 1.14   Cardiac Enzymes: No results for input(s): CKTOTAL, CKMB, CKMBINDEX, TROPONINI in the last 168 hours. BNP (last 3 results) No results for input(s): PROBNP in the last 8760 hours. CBG: No results for input(s): GLUCAP in the last 168 hours. Studies: Ct Pelvis Wo Contrast  Result Date: 12/12/2016 CLINICAL DATA:  Sacral  osteomyelitis. Sacral decubitus ulcer. History of melanoma. EXAM: CT PELVIS WITHOUT CONTRAST TECHNIQUE: Multidetector CT imaging of the pelvis was performed following the standard protocol without intravenous contrast. COMPARISON:  August 24, 2016 FINDINGS: Urinary Tract: The bladder is decompressed and thick walled, unchanged in the interval. The kidneys were not imaged. Bowel: There is a stool ball in the rectum with mild perirectal fat stranding. Remainder of the visualized bowel is normal. Vascular/Lymphatic: Mild atherosclerotic changes in non aneurysmal vessels. No adenopathy identified. Reproductive: Continued 4.6 cm mass in the left adnexum. No other changes. Other: Edema in the soft tissues, particularly the upper left thigh. Musculoskeletal: There is a decubitus ulcer overlying the sacrum. The defect reaches the death of the sacrum, particularly on the right. There is mild erosion of the posterior sacrum on the right as seen on series 3, image 67. No other definitive sites of erosion. There is a focus of gas in the soft tissues to the right of the sacrum on image 73 which may be extension directly from the ulcer. The chronic right hip fracture is identified and unchanged. Chronic fractures of the pelvic bones are unchanged. The previous acute intertrochanteric left hip fracture is again noted. The femur distal to the fracture line has migrated superiorly and now extends into the left iliopsoas muscle. Surrounding soft tissue calcification likely represents callus formation and myositis ossificans. No other acute bony abnormalities. IMPRESSION: 1. Decubitus ulcer over the sacrum with minimal associated erosion seen posteriorly in a right sacral spine. This is consistent with at least mild osteomyelitis. An MRI could further assess. 2. Large stool ball in the rectum. Mild increased attenuation in the perirectal fat. The patient may be developing stercoral colitis. 3. Chronic right hip fracture. 4. Superior  migration of the left femur, distal to a previously seen acute intertrochanteric fracture. The femur has migrated superiorly and now extends into the left iliopsoas muscle. Surrounding calcification is likely due to callus formation and myositis ossificans. 5. Continued 4.6 cm mass in the left adnexum.  An ultrasound could further evaluate if not already performed. Electronically Signed   By: Dorise Bullion III M.D   On: 12/12/2016 19:46    Scheduled Meds: . cefTAZidime (FORTAZ)  IV  1 g Intravenous Q24H  . chlorhexidine  15 mL Mouth Rinse BID  . Chlorhexidine Gluconate Cloth  6 each Topical Q0600  . mouth rinse  15 mL Mouth Rinse q12n4p  . mupirocin ointment  1 application Nasal BID  . potassium chloride (KCL MULTIRUN) 30 mEq in 265 mL IVPB  30 mEq Intravenous Once  . potassium chloride  40 mEq Oral Once  . sodium chloride flush  3 mL Intravenous Q12H  . valproate sodium  250 mg Intravenous Q8H  . vancomycin  500 mg Intravenous Q48H   Continuous Infusions: . dextrose 75 mL/hr at 12/12/16 2000  . heparin 700 Units/hr (12/12/16 2001)   PRN Meds: acetaminophen **OR** acetaminophen, RESOURCE THICKENUP CLEAR  Time spent: 35 minutes  Author: Berle Mull, MD [facility] Pager: [phone] 12/13/2016 3:42 PM  If 7PM-7AM, please contact night-coverage at [email or web address], password [facility]

## 2016-12-13 NOTE — Consult Note (Signed)
[name] for Infectious [facility]    Date of Admission:  12/10/2016   Total days of antibiotics 4        Day 2 Ceftazidime        Day 4 Vancomycin         Reason for Consult: CoNS Bacteremia   Referring Physician: Dr. Posey Pronto Primary Care Physician: Dr. [name]  Principal Problem:   Sacral osteomyelitis [facility]) Active Problems:   ARF (acute renal failure) ([place])   Stage IV pressure ulcer of sacral region ([place])   Coag negative Staphylococcus bacteremia   Hypernatremia   Adnexal mass   HLD (hyperlipidemia)   Essential hypertension   GERD   Dementia with behavioral disturbance   DVT, femoral, chronic (HCC)   Anemia of chronic disease   Protein-calorie malnutrition, severe   Intertrochanteric fracture of left hip ([place])   . cefTAZidime (FORTAZ)  IV  1 g Intravenous Q24H  . chlorhexidine  15 mL Mouth Rinse BID  . Chlorhexidine Gluconate Cloth  6 each Topical Q0600  . mouth rinse  15 mL Mouth Rinse q12n4p  . mupirocin ointment  1 application Nasal BID  . potassium chloride (KCL MULTIRUN) 30 mEq in 265 mL IVPB  30 mEq Intravenous Once  . potassium chloride  40 mEq Oral Once  . sodium chloride flush  3 mL Intravenous Q12H  . valproate sodium  250 mg Intravenous Q8H  . vancomycin  500 mg Intravenous Q48H    Recommendations: 1. Unfortunately, patient has a very poor prognosis given her multiple comorbidities, bedbound nature and chronic severe malnutrition. She is unlikely to ever heal her sacral osteomyelitis or sacral ulcer due to these factors. This is likely to be a chronic, recurrent issue leading to recurrent hospitalizations for sepsis, bacteremia and osteomyelitis. Given this, we will continue current management with vancomycin and ceftazidime while palliative care and primary team further explore goals of care with the family. If decision was made to pursue comfort care and hospice, would discontinue antibiotics. If decision was made to continue aggressive medical care,  would consider adding Flagyl to current regimen for anaerobic coverage. We will adjust and optimize antibiotic regimen based on goals of care discussion with family.  2. Repeat blood cultures 3/20. Place PICC line once blood cultures negative for 48 hours and treat with 6 weeks of IV antibiotic therapy if aggressive medical management were to be pursued. Bone biopsy is unlikely to be useful in this situation. 3. Emphasize wound care, off-loading, and nutrition for healing of sacral ulcer  Assessment: 1. 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 CT pelvis without contrast showed at least mild osteomyelitis of the sacrum. On exam, ulcer has palpable bone with foul, gray drainage and skin necrosis. Patient is on vancomycin for coag-negative staph bacteremia and ceftazidime. Surgery is following but does not recommend debridement, doubting that patient is an operative candidate. Patient is currently on Vancomycin and Ceftazidime with gram positive, gram negative and pseudomonal coverage. Unfortunately, due to patient's multiple chronic underlying conditions and bedbound nature, she is unlikely to ever heal her sacral osteomyelitis or sacral ulcer. Palliative care is involved to discuss [place].   2. CoNS Bacteremia: Likely secondary to sacral osteomyelitis. Blood cultures on 3/16 were positive for staph hominis in 2 out of 2 and staph epidermidis in 1 out of 2. Repeat blood cultures from 3/18 were positive for staph hominis in 1 out of 2. Patient is on day 4 of vancomycin. She has  been afebrile for 48 hours. White blood cell count is improving from 19-10.5 today. Sepsis now resolved.  3. Pseudomonas on urine culture: Urinalysis from admission was contaminated with too numerous to count squamous epithelial cells from an inadequate specimen collection. Urine culture grew Pseudomonas greater than 100,000 colonies. Patient's urine culture chronically grows bacteria and yeast. Urine culture  from November 2017 grew MRSA in urine culture from March 2017 grew Pseudomonas.  4. Left adnexal mass: Incidental finding on CT abdomen and pelvis from November 2017. Confirmed on CT pelvis from this admission, measuring 4.6 cm. Ultrasound recommended.  5. Dementia: Patient resides at long-term nursing facility. Her PPS level is around 20% given that she is bedbound, unable to do any activity and requires total care. Patient has multiple comorbidities indicative of poor prognosis including protein malnutrition, chronic stage IV sacral ulcer with osteomyelitis, dementia and a relatively new diagnosis of left adnexal mass of undetermined significance. Palliative care is following along to determining goals of care.   HPI: Tara Shaffer is a 71 y.o. female with past medical history of dementia, CK D, hypertension, hyperlipidemia, adnexal mass, stage IV pressure ulcer who presented to the emergency department from her nursing facility on 3/16 after being found unresponsive by nursing staff. On admission, patient was febrile to 102.5, hypotensive, tachycardic, tachypneic and hypoxic requiring a nonrebreather. Patient was admitted for acute respiratory failure and sepsis. During hospitalization, patient was found to have coag-negative staph bacteremia likely secondary to sacral osteomyelitis from a stage IV chronic sacral pressure ulcer.  Patient states that she feels better today. She specifically denies pain, chest pain, shortness of breath, nausea, vomiting, diarrhea, fever, chills, lightheadedness. She states she has lived at [facility] for "a while." She is unsure why she is in the hospital or why she lives at [place]. Her son is her POA.    Review of Systems: ROS  A complete ROS was negative except as per HPI.  General: Denies fever, chills.  Respiratory: Denies SOB, cough.   Cardiovascular: Denies chest pain and palpitations.  Gastrointestinal: Denies nausea, vomiting, abdominal pain,  diarrhea, constipation Genitourinary: Denies dysuria, urgency, frequency. Musculoskeletal: Denies myalgias, back pain, arthralgias Neurological: Denies dizziness, headaches, weakness, lightheadedness Psychiatric/Behavioral: Denies confusion, anxiety.  Past Medical History:  Diagnosis Date  . Adnexal mass   . Alcohol abuse 02/27/2012  . ALLERGIC RHINITIS 06/23/2007  . ANXIETY 06/23/2007  . ANXIETY DEPRESSION 06/12/2009  . Cancer ([place])    skin cx/ hx melanoma  . CKD (chronic kidney disease) stage 2, GFR 60-89 ml/min   . COLONIC POLYPS, HX OF 03/08/2002  . 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 Dementia   . DIVERTICULOSIS, COLON 08/04/2007  . GERD 06/23/2007  . HYPERLIPIDEMIA 06/24/2007  . HYPERTENSION 06/23/2007  . Irritable bowel syndrome 06/23/2007  . LOW BACK PAIN 06/24/2007  . MELANOMA, HX OF 08/04/2007  . Memory dysfunction 06/23/2011    Social History  Substance Use Topics  . Smoking status: Never Smoker  . Smokeless tobacco: Never Used  . Alcohol use 25.2 oz/week    42 Glasses of wine per week     Comment: 11/2016 " i Do not drink everyday"    Family History  Problem Relation Age of Onset  . Colon cancer Cousin   . Hypertension Other   . Diabetes Other    Allergies  Allergen Reactions  . Codeine Phosphate Nausea Only    OBJECTIVE: Physical Exam  Vitals:   12/13/16 0235 12/13/16 0335 12/13/16 0742 12/13/16 1110  BP: 116/72 114/66 127/72 116/78  Pulse: 65 62 63 72  Resp: 20 19 19  (!) 22  Temp:  98.4 F (36.9 C) 97.8 F (36.6 C) 98 F (36.7 C)  TempSrc:  Axillary Axillary Axillary  SpO2: 100% 100% 100% 100%  Weight:      Height:       General: Vital signs reviewed.  Patient is Chronically ill appearing, thin, in no acute distress and cooperative with exam.  Head: Normocephalic and atraumatic. Eyes: Conjunctivae normal, no scleral icterus.  Mouth: Dry mucous membranes Neck: Supple, trachea midline, no carotid bruit present.  Cardiovascular: RRR,  S1 normal, S2 normal. Pulmonary/Chest: Clear to auscultation bilaterally, no wheezes, rales, or rhonchi. Abdominal: Soft, non-tender, non-distended, BS +, no guarding present.  Musculoskeletal: Left upper extremity flexion contracture Extremities: No lower extremity edema bilaterally Neurological: Awake, alert, oriented to self, state, city. Not oriented to time. Strength is 3/5 in right upper extremity, 2/5 in left upper extremity, unable to spell world backwards, naming intact, unable to add nickel, dime, Penny Skin: large, deep foul smelling stage IV sacral ulcer with necrotic tissue and purulent drainage , pressure ulcers also noted on heels bilaterally.  Psychiatric: Flat mood and affect. speech and behavior is abnormal. Cognition and memory are abnormal.    Lab Results Lab Results  Component Value Date   WBC 10.5 12/13/2016   HGB 8.7 (L) 12/13/2016   HCT 29.5 (L) 12/13/2016   MCV 94.9 12/13/2016   PLT 110 (L) 12/13/2016    Lab Results  Component Value Date   CREATININE 2.54 (H) 12/13/2016   BUN 111 (H) 12/13/2016   NA 151 (H) 12/13/2016   K 2.9 (L) 12/13/2016   CL 121 (H) 12/13/2016   CO2 18 (L) 12/13/2016    Lab Results  Component Value Date   ALT 16 12/12/2016   AST 26 12/12/2016   ALKPHOS 66 12/12/2016   BILITOT 0.5 12/12/2016    Imaging: CT Abdomen/Pelvis 3/19: 1. Decubitus ulcer over the sacrum with minimal associated erosion seen posteriorly in a right sacral spine. This is consistent with at least mild osteomyelitis. An MRI could further assess. 2. Large stool ball in the rectum. Mild increased attenuation in the perirectal fat. The patient may be developing stercoral colitis. 3. Chronic right hip fracture. 4. Superior migration of the left femur, distal to a previously seen acute intertrochanteric fracture. The femur has migrated superiorly and now extends into the left iliopsoas muscle. Surrounding calcification is likely due to callus formation and  myositis ossificans. 5. Continued 4.6 cm mass in the left adnexum. An ultrasound could further evaluate if not already performed.  Microbiology: Recent Results (from the past 240 hour(s))  Culture, blood (Routine x 2)     Status: Abnormal   Collection Time: 12/10/16  7:30 AM  Result Value Ref Range Status   Specimen Description BLOOD RIGHT FOREARM  Final   Special Requests BOTTLES DRAWN AEROBIC ONLY  5CC  Final   Culture  Setup Time   Final    GRAM POSITIVE COCCI IN CLUSTERS AEROBIC BOTTLE ONLY CRITICAL RESULT CALLED TO, READ BACK BY AND VERIFIED WITH: G ABBOTT, PHARMD @0728  12/11/16 MKELLY,MLT    Culture STAPHYLOCOCCUS HOMINIS (A)  Final   Report Status 12/13/2016 FINAL  Final   Organism ID, Bacteria STAPHYLOCOCCUS HOMINIS  Final      Susceptibility   Staphylococcus hominis - MIC*    CIPROFLOXACIN >=8 RESISTANT Resistant     ERYTHROMYCIN >=8 RESISTANT Resistant  GENTAMICIN <=0.5 SENSITIVE Sensitive     OXACILLIN >=4 RESISTANT Resistant     TETRACYCLINE >=16 RESISTANT Resistant     VANCOMYCIN 1 SENSITIVE Sensitive     TRIMETH/SULFA 80 RESISTANT Resistant     CLINDAMYCIN >=8 RESISTANT Resistant     RIFAMPIN <=0.5 SENSITIVE Sensitive     Inducible Clindamycin NEGATIVE Sensitive     * STAPHYLOCOCCUS HOMINIS  Blood Culture ID Panel (Reflexed)     Status: Abnormal   Collection Time: 12/10/16  7:30 AM  Result Value Ref Range Status   Enterococcus species NOT DETECTED NOT DETECTED Final   Listeria monocytogenes NOT DETECTED NOT DETECTED Final   Staphylococcus species DETECTED (A) NOT DETECTED Final    Comment: Methicillin (oxacillin) resistant coagulase negative staphylococcus. Possible blood culture contaminant (unless isolated from more than one blood culture draw or clinical case suggests pathogenicity). No antibiotic treatment is indicated for blood  culture contaminants. CRITICAL RESULT CALLED TO, READ BACK BY AND VERIFIED WITH: G ABBOTT, PHARMD @0728  12/11/16 MKELLY,MLT      Staphylococcus aureus NOT DETECTED NOT DETECTED Final   Methicillin resistance DETECTED (A) NOT DETECTED Final    Comment: CRITICAL RESULT CALLED TO, READ BACK BY AND VERIFIED WITH: G ABBOTT,PHARMD @0728  12/11/16 MKELLY,MLT    Streptococcus species NOT DETECTED NOT DETECTED Final   Streptococcus agalactiae NOT DETECTED NOT DETECTED Final   Streptococcus pneumoniae NOT DETECTED NOT DETECTED Final   Streptococcus pyogenes NOT DETECTED NOT DETECTED Final   Acinetobacter baumannii NOT DETECTED NOT DETECTED Final   Enterobacteriaceae species NOT DETECTED NOT DETECTED Final   Enterobacter cloacae complex NOT DETECTED NOT DETECTED Final   Escherichia coli NOT DETECTED NOT DETECTED Final   Klebsiella oxytoca NOT DETECTED NOT DETECTED Final   Klebsiella pneumoniae NOT DETECTED NOT DETECTED Final   Proteus species NOT DETECTED NOT DETECTED Final   Serratia marcescens NOT DETECTED NOT DETECTED Final   Haemophilus influenzae NOT DETECTED NOT DETECTED Final   Neisseria meningitidis NOT DETECTED NOT DETECTED Final   Pseudomonas aeruginosa NOT DETECTED NOT DETECTED Final   Candida albicans NOT DETECTED NOT DETECTED Final   Candida glabrata NOT DETECTED NOT DETECTED Final   Candida krusei NOT DETECTED NOT DETECTED Final   Candida parapsilosis NOT DETECTED NOT DETECTED Final   Candida tropicalis NOT DETECTED NOT DETECTED Final  Culture, blood (Routine x 2)     Status: Abnormal   Collection Time: 12/10/16  7:35 AM  Result Value Ref Range Status   Specimen Description BLOOD RIGHT HAND  Final   Special Requests IN PEDIATRIC BOTTLE 4CC  Final   Culture  Setup Time   Final    GRAM POSITIVE COCCI IN CLUSTERS AEROBIC BOTTLE ONLY CRITICAL VALUE NOTED.  VALUE IS CONSISTENT WITH PREVIOUSLY REPORTED AND CALLED VALUE.    Culture (A)  Final    STAPHYLOCOCCUS EPIDERMIDIS THE SIGNIFICANCE OF ISOLATING THIS ORGANISM FROM A SINGLE SET OF BLOOD CULTURES WHEN MULTIPLE SETS ARE DRAWN IS UNCERTAIN. PLEASE NOTIFY  THE MICROBIOLOGY DEPARTMENT WITHIN ONE WEEK IF SPECIATION AND SENSITIVITIES ARE REQUIRED. STAPHYLOCOCCUS HOMINIS SUSCEPTIBILITIES PERFORMED ON PREVIOUS CULTURE WITHIN THE LAST 5 DAYS.    Report Status 12/13/2016 FINAL  Final  Urine culture     Status: Abnormal   Collection Time: 12/10/16  9:52 AM  Result Value Ref Range Status   Specimen Description URINE, RANDOM  Final   Special Requests NONE  Final   Culture >=100,000 COLONIES/mL PSEUDOMONAS AERUGINOSA (A)  Final   Report Status 12/12/2016 FINAL  Final  Organism ID, Bacteria PSEUDOMONAS AERUGINOSA (A)  Final      Susceptibility   Pseudomonas aeruginosa - MIC*    CEFTAZIDIME 4 SENSITIVE Sensitive     CIPROFLOXACIN <=0.25 SENSITIVE Sensitive     GENTAMICIN <=1 SENSITIVE Sensitive     IMIPENEM 2 SENSITIVE Sensitive     PIP/TAZO <=4 SENSITIVE Sensitive     CEFEPIME 2 SENSITIVE Sensitive     * >=100,000 COLONIES/mL PSEUDOMONAS AERUGINOSA  MRSA PCR Screening     Status: None   Collection Time: 12/10/16 11:39 AM  Result Value Ref Range Status   MRSA by PCR NEGATIVE NEGATIVE Final    Comment:        The GeneXpert MRSA Assay (FDA approved for NASAL specimens only), is one component of a comprehensive MRSA colonization surveillance program. It is not intended to diagnose MRSA infection nor to guide or monitor treatment for MRSA infections.   Culture, blood (routine x 2)     Status: None (Preliminary result)   Collection Time: 12/12/16  4:30 AM  Result Value Ref Range Status   Specimen Description BLOOD RIGHT HAND  Final   Special Requests IN PEDIATRIC BOTTLE 0.5CC  Final   Culture  Setup Time   Final    GRAM POSITIVE COCCI IN CLUSTERS IN PEDIATRIC BOTTLE CRITICAL RESULT CALLED TO, READ BACK BY AND VERIFIED WITH: Hughie Closs 12/13/16 @ 63 M VESTAL    Culture GRAM POSITIVE COCCI  Final   Report Status PENDING  Incomplete  Blood Culture ID Panel (Reflexed)     Status: Abnormal   Collection Time: 12/12/16  4:30 AM  Result Value  Ref Range Status   Enterococcus species NOT DETECTED NOT DETECTED Final   Listeria monocytogenes NOT DETECTED NOT DETECTED Final   Staphylococcus species DETECTED (A) NOT DETECTED Final    Comment: Methicillin (oxacillin) resistant coagulase negative staphylococcus. Possible blood culture contaminant (unless isolated from more than one blood culture draw or clinical case suggests pathogenicity). No antibiotic treatment is indicated for blood  culture contaminants. CRITICAL RESULT CALLED TO, READ BACK BY AND VERIFIED WITH: Hughie Closs 12/13/16 @ 36 M VESTAL    Staphylococcus aureus NOT DETECTED NOT DETECTED Final   Methicillin resistance DETECTED (A) NOT DETECTED Final    Comment: CRITICAL RESULT CALLED TO, READ BACK BY AND VERIFIED WITH: Hughie Closs 12/13/16 @ 19 M VESTAL    Streptococcus species NOT DETECTED NOT DETECTED Final   Streptococcus agalactiae NOT DETECTED NOT DETECTED Final   Streptococcus pneumoniae NOT DETECTED NOT DETECTED Final   Streptococcus pyogenes NOT DETECTED NOT DETECTED Final   Acinetobacter baumannii NOT DETECTED NOT DETECTED Final   Enterobacteriaceae species NOT DETECTED NOT DETECTED Final   Enterobacter cloacae complex NOT DETECTED NOT DETECTED Final   Escherichia coli NOT DETECTED NOT DETECTED Final   Klebsiella oxytoca NOT DETECTED NOT DETECTED Final   Klebsiella pneumoniae NOT DETECTED NOT DETECTED Final   Proteus species NOT DETECTED NOT DETECTED Final   Serratia marcescens NOT DETECTED NOT DETECTED Final   Haemophilus influenzae NOT DETECTED NOT DETECTED Final   Neisseria meningitidis NOT DETECTED NOT DETECTED Final   Pseudomonas aeruginosa NOT DETECTED NOT DETECTED Final   Candida albicans NOT DETECTED NOT DETECTED Final   Candida glabrata NOT DETECTED NOT DETECTED Final   Candida krusei NOT DETECTED NOT DETECTED Final   Candida parapsilosis NOT DETECTED NOT DETECTED Final   Candida tropicalis NOT DETECTED NOT DETECTED Final    Martyn Malay, DO PGY-3  Internal Medicine Resident Pager #  [phone] 12/13/2016 3:39 PM

## 2016-12-14 DIAGNOSIS — M4628 Osteomyelitis of vertebra, sacral and sacrococcygeal region: Secondary | ICD-10-CM

## 2016-12-14 DIAGNOSIS — X58XXXS Exposure to other specified factors, sequela: Secondary | ICD-10-CM

## 2016-12-14 DIAGNOSIS — S72001S Fracture of unspecified part of neck of right femur, sequela: Secondary | ICD-10-CM

## 2016-12-14 DIAGNOSIS — S72002S Fracture of unspecified part of neck of left femur, sequela: Secondary | ICD-10-CM

## 2016-12-14 DIAGNOSIS — Z66 Do not resuscitate: Secondary | ICD-10-CM

## 2016-12-14 DIAGNOSIS — F0390 Unspecified dementia without behavioral disturbance: Secondary | ICD-10-CM

## 2016-12-14 LAB — FOLATE RBC
Folate, RBC: UNDETERMINED ng/mL
HEMATOCRIT: 40.9 % (ref 34.0–46.6)

## 2016-12-14 LAB — BASIC METABOLIC PANEL
ANION GAP: 13 (ref 5–15)
BUN: 92 mg/dL — ABNORMAL HIGH (ref 6–20)
CALCIUM: 7.9 mg/dL — AB (ref 8.9–10.3)
CHLORIDE: 120 mmol/L — AB (ref 101–111)
CO2: 17 mmol/L — AB (ref 22–32)
CREATININE: 2.15 mg/dL — AB (ref 0.44–1.00)
GFR calc non Af Amer: 22 mL/min — ABNORMAL LOW (ref >60)
GFR, EST AFRICAN AMERICAN: 26 mL/min — AB (ref >60)
Glucose, Bld: 77 mg/dL (ref 65–99)
Potassium: 3.2 mmol/L — ABNORMAL LOW (ref 3.5–5.1)
SODIUM: 150 mmol/L — AB (ref 135–145)

## 2016-12-14 LAB — CBC
HEMATOCRIT: 29.4 % — AB (ref 36.0–46.0)
Hemoglobin: 8.8 g/dL — ABNORMAL LOW (ref 12.0–15.0)
MCH: 28.1 pg (ref 26.0–34.0)
MCHC: 29.9 g/dL — ABNORMAL LOW (ref 30.0–36.0)
MCV: 93.9 fL (ref 78.0–100.0)
Platelets: 104 10*3/uL — ABNORMAL LOW (ref 150–400)
RBC: 3.13 MIL/uL — AB (ref 3.87–5.11)
RDW: 15.8 % — ABNORMAL HIGH (ref 11.5–15.5)
WBC: 9.1 10*3/uL (ref 4.0–10.5)

## 2016-12-14 LAB — HEPARIN LEVEL (UNFRACTIONATED): Heparin Unfractionated: 0.35 IU/mL (ref 0.30–0.70)

## 2016-12-14 NOTE — Progress Notes (Signed)
Daily Progress Note   Patient Name: Tara Shaffer       Date: 12/14/2016 DOB: 11-26-1945  Age: 71 y.o. MRN#: [id number] Attending Physician: Lavina Hamman, MD Primary Care Physician: [name], MD Admit Date: 12/10/2016  Reason for Consultation/Follow-up: Establishing goals of care  Subjective: Met with patient's two sons, [name] and [name] this morning. Patient's health has been declining significantly over the last year. She is losing weight. Her po intake is decreasing. She had a fall with a hip fracture and they opted not to have surgery. Her dementia has worsened, she is talking less and is less interactive. Her overall quality of life is much less than what it use to be. She now has a decubitis ulcer that is infected, likely in the bone, as well as infection in her blood. We discussed Palliative medicine concepts, quality of life, goals of care. Their goals of care are to continue current level of care, treat what is treatable, medically optimize their mom and then return her back to [facility] with Hospice support and provide comfort measures only. They would not want her to return to the hospital in the event of another infection. They would not want her to receive a feeding tube should she stop eating all together or develop more severe dysphagia. They agree to DNR code status. If she declines further, they are open to discontinuing treatment for current infections and transitioning to comfort measures only, but would like to continue current level of care for now.  Review of Systems  Unable to perform ROS: Dementia    Length of Stay: 4  Current Medications: Scheduled Meds:  . cefTAZidime (FORTAZ)  IV  1 g Intravenous Q24H  . chlorhexidine  15 mL Mouth Rinse BID  . Chlorhexidine  Gluconate Cloth  6 each Topical Q0600  . mouth rinse  15 mL Mouth Rinse q12n4p  . mupirocin ointment  1 application Nasal BID  . sodium chloride flush  3 mL Intravenous Q12H  . valproate sodium  250 mg Intravenous Q8H  . vancomycin  500 mg Intravenous Q48H    Continuous Infusions: . dextrose 75 mL/hr at 12/14/16 0635  . heparin 700 Units/hr (12/14/16 0800)    PRN Meds: acetaminophen **OR** acetaminophen, RESOURCE THICKENUP CLEAR  Physical Exam  Vital Signs: BP 114/62 (BP Location: Right Arm)   Pulse 62   Temp 97.6 F (36.4 C) (Axillary)   Resp 19   Ht '5\' 7"'$  (1.702 m)   Wt 41.3 kg (91 lb)   SpO2 100%   BMI 14.25 kg/m  SpO2: SpO2: 100 % O2 Device: O2 Device: Nasal Cannula O2 Flow Rate: O2 Flow Rate (L/min): 2 L/min  Intake/output summary:  Intake/Output Summary (Last 24 hours) at 12/14/16 0915 Last data filed at 12/14/16 0737  Gross per 24 hour  Intake          1304.63 ml  Output                0 ml  Net          1304.63 ml   LBM: Last BM Date: 12/12/16 Baseline Weight: Weight: 47.6 kg (105 lb) Most recent weight: Weight: 41.3 kg (91 lb)       Palliative Assessment/Data: PPS: 10%   Flowsheet Rows     Most Recent Value  Intake Tab  Referral Department  Hospitalist  Unit at Time of Referral  Cardiac/Telemetry Unit  Palliative Care Primary Diagnosis  Sepsis/Infectious Disease  Date Notified  12/11/16  Palliative Care Type  New Palliative care  Reason for referral  Clarify Goals of Care  Date of Admission  12/10/16  Date first seen by Palliative Care  12/12/16  # of days Palliative referral response time  1 Day(s)  # of days IP prior to Palliative referral  1  Clinical Assessment  Psychosocial & Spiritual Assessment  Palliative Care Outcomes      Patient Active Problem List   Diagnosis Date Noted  . Sacral osteomyelitis ([employer]) 12/13/2016  . Coag negative Staphylococcus bacteremia 12/13/2016  . Hypernatremia 12/10/2016  . Adnexal mass   .  Stage IV pressure ulcer of sacral region ([place]) 11/23/2016  . Intertrochanteric fracture of left hip ([place]) 08/27/2016  . Protein-calorie malnutrition, severe 08/23/2016  . ARF (acute renal failure) ([place]) 08/21/2016  . Anemia of chronic disease 08/29/2015  . DVT, femoral, chronic ([place]) 03/20/2015  . Alcohol abuse 02/27/2012  . Dementia with behavioral disturbance 06/23/2011  . 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 DIVERTICULOSIS, COLON 08/04/2007  . MELANOMA, HX OF 08/04/2007  . HLD (hyperlipidemia) 06/24/2007  . Anxiety state 06/23/2007  . Essential hypertension 06/23/2007  . ALLERGIC RHINITIS 06/23/2007  . GERD 06/23/2007  . Irritable bowel syndrome 06/23/2007    Palliative Care Assessment & Plan   Patient Profile: 71 y.o. female  with past medical history of dementia, HTN, decubitis, anxiety w/ depression, adnexal mass, alcohol abuse, melanoma, DJD- cervical spine, GERD, HLD, IBS, compression fractures, CKD stage II, urinary retention, L femur intertrochanteric fracture (was being treated conservatively w/ Ultram '50mg'$  q8hrs), chronic left femoral DVT admitted on 12/10/2016 from [place] facility after being found unresponsive. Required bipap in ED. Now back to baseline. Workup thus far reveals sepsis and hypernatremia, AKI, psuedomonas UTI, BC + for coag neg staph, surgical consult for Stage IV decubitis- MRI for possible osteomyelitis pending. Palliative medicine consulted for [place].  Assessment/Recommendations/Plan   DNR  Continue current level of care, do not escalate  Medically optimize then d/c to SNF with Hospice support  comfort care measures  PMT will continue to follow and readdress GOC as needed   Goals of Care and Additional Recommendations:  Limitations on Scope of Treatment: No Surgical Procedures  Code Status:  DNR  Prognosis:   < 6 months  Discharge Planning:  [place] with Hospice  Care plan was discussed  with patient's two sons [name] and Delfino Lovett [facility]).   Thank you for allowing the Palliative Medicine Team to assist in the care of this patient.   Total Time: 45 minutes  Greater than 50%  of this time was spent counseling and coordinating care related to the above assessment and plan.  Mariana Kaufman, AGNP-C Palliative Medicine   Please contact Palliative Medicine Team phone at [phone] for questions and concerns.

## 2016-12-14 NOTE — Progress Notes (Signed)
Pt has refused to eat meals thus far today. This is despite attempts to feed her and offer multiple times throughout the day. She has taken very small sips of milk.   Lorre Munroe

## 2016-12-14 NOTE — Progress Notes (Signed)
Per day shift nurse, she did not initiate rectal tube order obtained to protect sacral wound due to consistency of stool (mushy not liquid).

## 2016-12-14 NOTE — Progress Notes (Signed)
[facility] Progress Note  Patient: Tara Shaffer WJX:[id number]   PCP: [name], MD DOB: 03/21/1946   DOA: 12/10/2016   DOS: 12/14/2016   Date of Service: the patient was seen and examined on 12/14/2016  Subjective: Patient more lethargic today, refusing to eat today.  Brief hospital course: Pt. with PMH of dementia, HTN, sacral decubitus ulcer, recent DVT; admitted on 12/10/2016, with complaint of unresponsive episode, was found to have sepsis and hypernatremia. Blood cultures 2 out of 2 positive for staph hominis and epidermis. Repeat blood cultures also positive for staph epidermidis needed ID consulted. Urine culture positive for Pseudomonas. Currently further plan is further discussion regarding goals of care with family  Assessment and Plan: 1. Sepsis  Coagulase-negative bacteremia 2 out of 2 likely from sacral ulcer Pseudomonas UTI sacral osteomyelitis  Patient presented with an unresponsive episode. Patient was initially placed on BiPAP. Currently appears to be back to his baseline based on information provided in the ER notes. Blood cultures are +2 out of 2 for coag is negative staph, will continue vancomycin for that. Urine culture is positive for Pseudomonas, pansensitive, narrow antibiotics to [place]. Changed from stepdown to telemetry Repeat cultures 1 out of 2 positive for coagulase-negative staph. Lactic acidosis is still an issue receiving LR bolus. Rectal tube will be inserted to avoid contamination of the sacral wound. May also require a Foley catheter. Surgery initially felt that the patient may require debridement, but later they feel that the patient will just require wound care and no debridement. Not a surgical candidate for any procedure. Unable to complete MRI due to patient's posture. Infectious diseases also consulted. Family had a goals of care discussion, please see below.  2. Hypernatremia. Acute kidney injury Likely due to poor oral  intake. Renal ultrasound unremarkable for any acute abnormality. We will continue to closely monitor ins and outs and daily weight. Patient was started on D10 which I changed to D5. Monitor BMP Sodium level getting better. Patient was also given LR bolus. Unable to place a PICC line due to positive blood cultures.  3. Acute kidney injury. Chronic kidney disease stage II. Renal function significantly worsened from baseline. Getting better with aggressive IV hydration.  We will monitor.  4. Dementia with behavioral disturbances.   unresponsive episode It has been reported as up at her baseline the patient is usually alert and oriented to self and is able to make her wants and needs known. It appears that the patient has returned back to her baseline based on my evaluation. We'll monitor.  5. Recent DVT in January 2018. Patient was on Xarelto, currently switched to heparin due to possible need for surgical treatment as well as renal dysfunction. will probably resume Xarelto once surgery has no plan for surgical debridement of her ulcer and improvement in renal function  6. Stage IV sacral ulcer with foul-smelling General surgery consulted, recommends MRI of the sacrum to rule out any osteomyelitis. Unable to complete the MRI. They feel no indication for surgical debridement. Patient not a candidate for any surgical procedure.  7. Dysphagia. Speech therapy consulted, dysphagia type II diet recommended.  8. Hypokalemia. Replacing and rechecking tomorrow.  9. Goals of care discussion. Based on the discussion with the palliative care, family has decided to change patient's CODE STATUS to DO NOT RESUSCITATE. Recommend to continue treat what is treatable and transition her to SNF with hospice. At present palliative care is going to discuss with the patient's family regarding long-term IV antibiotics  for the treatment for sacral osteomyelitis, although given that the patient is not of  candidate for diverticular colostomy and Foley catheter will also be at high risk for infection, I do not feel that the patient is going to be adequately treated despite all aggressive input. At present ideally, I recommend complete comfort, infectious diseases also agrees.  Bowel regimen: last BM 12/13/2016 Diet: Dysphagia type II diet DVT Prophylaxis: on therapeutic anticoagulation.  Advance goals of care discussion: DNR/DNI, see above  Family Communication: No family at bedside  Disposition:  To be decided  Consultants: Gen. surgery, palliative care, infectious disease Procedures: none  Antibiotics: Anti-infectives    Start     Dose/Rate Route Frequency Ordered Stop   12/12/16 1500  cefTAZidime (FORTAZ) 1 g in dextrose 5 % 50 mL IVPB     1 g 100 mL/hr over 30 Minutes Intravenous Every 24 hours 12/12/16 1431     12/12/16 0800  vancomycin (VANCOCIN) 500 mg in sodium chloride 0.9 % 100 mL IVPB     500 mg 100 mL/hr over 60 Minutes Intravenous Every 48 hours 12/10/16 0925     12/10/16 1400  piperacillin-tazobactam (ZOSYN) IVPB 2.25 g  Status:  Discontinued     2.25 g 100 mL/hr over 30 Minutes Intravenous Every 6 hours 12/10/16 0925 12/12/16 1153   12/10/16 0745  piperacillin-tazobactam (ZOSYN) IVPB 3.375 g     3.375 g 100 mL/hr over 30 Minutes Intravenous  Once 12/10/16 0735 12/10/16 0826   12/10/16 0745  vancomycin (VANCOCIN) IVPB 1000 mg/200 mL premix     1,000 mg 200 mL/hr over 60 Minutes Intravenous  Once 12/10/16 0735 12/10/16 0856       Objective: Physical Exam: Vitals:   12/14/16 1200 12/14/16 1300 12/14/16 1400 12/14/16 1500  BP: 126/68  104/60   Pulse: (!) 33 68 67 71  Resp: 14 19 20 17   Temp:      TempSrc:      SpO2: 100% 100% 100% 100%  Weight:      Height:        Intake/Output Summary (Last 24 hours) at 12/14/16 1522 Last data filed at 12/14/16 1503  Gross per 24 hour  Intake          1871.13 ml  Output                4 ml  Net          1867.13 ml    Filed Weights   12/10/16 1137 12/11/16 0441 12/14/16 0444  Weight: 43.6 kg (96 lb 1.9 oz) 44 kg (97 lb) 41.3 kg (91 lb)   General: Alert, Awake and Unable to follow command. Appear in moderate distress, affect appropriate Eyes: PERRL, Conjunctiva normal ENT: Oral Mucosa clear moist Neck: difficult to assess JVD, no Abnormal Mass Or lumps Cardiovascular: S1 and S2 Present, aortic systolic Murmur, Respiratory: Bilateral Air entry equal and Decreased, no use of accessory muscle, Clear to Auscultation, no Crackles, no wheezes Abdomen: Bowel Sound present, Soft and no tenderness Skin: no redness, no Rash, no induration, sacral ulcer present on admission Extremities: no Pedal edema, no calf tenderness Neurologic: Grossly no focal neuro deficit, generalized lethargy and weakness.  Data Reviewed: CBC:  Recent Labs Lab 12/10/16 0730 12/10/16 1110 12/11/16 0255 12/12/16 0428 12/13/16 0315 12/14/16 0423  WBC 19.9*  --  14.8* 14.2* 10.5 9.1  NEUTROABS 16.9*  --   --  11.6*  --   --   HGB 12.5  --  8.9* 9.5* 8.7* 8.8*  HCT 44.7 40.9 31.8* 33.1* 29.5* 29.4*  MCV 100.0  --  100.0 97.6 94.9 93.9  PLT 219  --  107* 107* 110* 332*   Basic Metabolic Panel:  Recent Labs Lab 12/10/16 1332  12/11/16 0255  12/12/16 0045 12/12/16 0428 12/12/16 1532 12/13/16 0315 12/14/16 0423  NA  --   < > 169*  < > 157* 156* 152* 151* 150*  K  --   < > 3.2*  < > 2.8* 3.4* 3.2* 2.9* 3.2*  CL  --   < > 130*  < > 124* 125* 122* 121* 120*  CO2  --   < > 25  < > 20* 19* 18* 18* 17*  GLUCOSE  --   < > 304*  < > 185* 162* 137* 103* 77  BUN  --   < > 183*  < > 154* 148* 127* 111* 92*  CREATININE  --   < > 4.12*  < > 3.36* 3.21* 2.83* 2.54* 2.15*  CALCIUM  --   < > 7.5*  < > 7.9* 8.0* 7.7* 7.7* 7.9*  MG 2.9*  --  2.6*  --   --  2.3  --  2.0  --   PHOS 5.2*  --  4.2  --   --   --   --   --   --   < > = values in this interval not displayed.  Liver Function Tests:  Recent Labs Lab 12/10/16 0730  12/11/16 0255 12/12/16 0428  AST 38 26 26  ALT 20 18 16   ALKPHOS 68 56 66  BILITOT 0.9 0.6 0.5  PROT 7.3 5.4* 5.7*  ALBUMIN 2.7* 1.9* 1.9*    Recent Labs Lab 12/10/16 0730  LIPASE 55*    Recent Labs Lab 12/10/16 1927  AMMONIA 26   Coagulation Profile:  Recent Labs Lab 12/10/16 0730 12/12/16 0428  INR 1.35 1.14   Cardiac Enzymes: No results for input(s): CKTOTAL, CKMB, CKMBINDEX, TROPONINI in the last 168 hours. BNP (last 3 results) No results for input(s): PROBNP in the last 8760 hours. CBG: No results for input(s): GLUCAP in the last 168 hours. Studies: No results found.  Scheduled Meds: . cefTAZidime (FORTAZ)  IV  1 g Intravenous Q24H  . chlorhexidine  15 mL Mouth Rinse BID  . Chlorhexidine Gluconate Cloth  6 each Topical Q0600  . mouth rinse  15 mL Mouth Rinse q12n4p  . mupirocin ointment  1 application Nasal BID  . sodium chloride flush  3 mL Intravenous Q12H  . valproate sodium  250 mg Intravenous Q8H  . vancomycin  500 mg Intravenous Q48H   Continuous Infusions: . dextrose 75 mL/hr at 12/14/16 1401  . heparin 700 Units/hr (12/14/16 0800)   PRN Meds: acetaminophen **OR** acetaminophen, RESOURCE THICKENUP CLEAR  Time spent: 30 minutes  Author: Berle Mull, MD [facility] Pager: [phone] 12/14/2016 3:22 PM  If 7PM-7AM, please contact night-coverage at [email or web address], password [facility]

## 2016-12-14 NOTE — Progress Notes (Signed)
ANTICOAGULATION CONSULT NOTE - Follow Up Consult  Pharmacy Consult for Heparin Indication: DVT  (10/21/16)  Allergies  Allergen Reactions  . Codeine Phosphate Nausea Only    Patient Measurements: Height: 5\' 7"  (170.2 cm) Weight: 91 lb (41.3 kg) IBW/kg (Calculated) : 61.6 Heparin Dosing Weight: 41 kg  Vital Signs: Temp: 97.6 F (36.4 C) (03/20 0756) Temp Source: Axillary (03/20 0756) BP: 114/62 (03/20 0756) Pulse Rate: 71 (03/20 1000)  Labs:  Recent Labs  12/11/16 1547  12/12/16 0045  12/12/16 0428 12/12/16 1532 12/13/16 0315 12/14/16 0423  HGB  --   --   --   < > 9.5*  --  8.7* 8.8*  HCT  --   --   --   --  33.1*  --  29.5* 29.4*  PLT  --   --   --   --  107*  --  110* 104*  APTT 45*  --  64*  --   --   --   --   --   LABPROT  --   --   --   --  14.6  --   --   --   INR  --   --   --   --  1.14  --   --   --   HEPARINUNFRC 0.31  --  0.39  --   --   --  0.40 0.35  CREATININE 3.60*  < > 3.36*  --  3.21* 2.83* 2.54* 2.15*  < > = values in this interval not displayed.  Estimated Creatinine Clearance: 16.1 mL/min (A) (by C-G formula based on SCr of 2.15 mg/dL (H)).  Assessment:  71 yr old female continues on IV heparin for hx DVT 10/21/16.  Xarelto on hold.  Heparin level remains therapeutic (0.35) on 700 units/hr.  Hemoglobin and platelet count low.  RN reported dark stools on 3/19, not new. Now has rectal tube, stools noted green. No overt bleeding.  Goal of Therapy:  Heparin level 0.3-0.7 units/ml Monitor platelets by anticoagulation protocol: Yes   Plan:   Continue heparin drip at 700 units/hr.  Daily heparin level and CBC.  Monitor for any bleeding.  Arty Baumgartner, [place] Pager: [phone] 12/14/2016,11:24 AM

## 2016-12-14 NOTE — Progress Notes (Signed)
Initial Nutrition Assessment  DOCUMENTATION CODES:   Agree with severe PCM, already documented in problem list by MD.  INTERVENTION:   None at this time as patient is refusing to eat.  NUTRITION DIAGNOSIS:   Increased nutrient needs related to wound healing as evidenced by estimated needs.  GOAL:   Patient will meet greater than or equal to 90% of their needs  MONITOR:   PO intake, Skin, Other (Comment) (overall goals of care)  REASON FOR ASSESSMENT:   Malnutrition Screening Tool    ASSESSMENT:   71 y.o. female with PMH of dementia with behavioral disturbances, hypertension, urinary retention, hydronephrosis, stage IV sacral decubitus, anxiety, alcohol abuse, recent DVT who presented to the ED with chief complaint of altered mental status. Patient is being treated for sacral wound infection complicated by sacral osteomyelitis and coagulase-negative staph bacteremia.   Palliative Care Team has discussed goals of care with patient's sons and plans are to not escalate care, but continue current medical management, then d/c to SNF with Hospice support. They do not want a feeding tube. If she declines, will transition to comfort care.  Agree with MD diagnosis of severe malnutrition in the context of chronic illness as evidenced by 12% weight loss within the past month, likely due to poor oral intake, and BMI=14. Unable to complete Nutrition-Focused physical exam at this time.   Diet Order:  DIET DYS 2 Room service appropriate? Yes; Fluid consistency: Thin  Skin:  Wound (see comment) (stage IV to sacrum)  Last BM:  3/18  Height:   Ht Readings from Last 1 Encounters:  12/10/16 5\' 7"  (1.702 m)    Weight:   Wt Readings from Last 1 Encounters:  12/14/16 91 lb (41.3 kg)    Ideal Body Weight:  61.4 kg  BMI:  Body mass index is 14.25 kg/m.  Estimated Nutritional Needs:   Kcal:  1250-1450  Protein:  60-70 gm  Fluid:  1.5 L  EDUCATION NEEDS:   No education needs  identified at this time  Molli Barrows, [place], [place], [place] Pager [phone] After Hours Pager [phone]

## 2016-12-14 NOTE — Progress Notes (Signed)
[facility]  Date of Admission:  12/10/2016   Total days of antibiotics 5        Day 2 Ceftazidime        Day 5 Vancomycin         Principal Problem:   Sacral osteomyelitis (HCC) Active Problems:   ARF (acute renal failure) (HCC)   Stage IV pressure ulcer of sacral region (HCC)   Coag negative Staphylococcus bacteremia   Hypernatremia   Adnexal mass   HLD (hyperlipidemia)   Essential hypertension   GERD   Dementia with behavioral disturbance   DVT, femoral, chronic (HCC)   Anemia of chronic disease   Protein-calorie malnutrition, severe   Intertrochanteric fracture of left hip (HCC)   Dementia without behavioral disturbance   . cefTAZidime (FORTAZ)  IV  1 g Intravenous Q24H  . chlorhexidine  15 mL Mouth Rinse BID  . Chlorhexidine Gluconate Cloth  6 each Topical Q0600  . mouth rinse  15 mL Mouth Rinse q12n4p  . mupirocin ointment  1 application Nasal BID  . sodium chloride flush  3 mL Intravenous Q12H  . valproate sodium  250 mg Intravenous Q8H  . vancomycin  500 mg Intravenous Q48H    SUBJECTIVE: Patient denies fever, chills, nausea, vomiting, pain. She asks when she can go home. She is refusing to eat meals. Kasie with palliative care was able to meet with patient's sons who are her healthcare power of attorneys. Patient's sons stated they did not want to escalate care, but would prefer to continue current management and medically optimize her before sending her back to [place]. Patient has been made DO NOT RESUSCITATE and future plans would not include sending patient back to the hospital if she were to decline. She will go back to [facility] facility with hospice support.  Review of Systems: ROS  General: Denies fever, chills.  Respiratory: Denies SOB.   Gastrointestinal: Denies nausea, vomiting   Past Medical History:  Diagnosis Date  . Adnexal mass   . Alcohol abuse 02/27/2012  . ALLERGIC RHINITIS 06/23/2007  . ANXIETY  06/23/2007  . ANXIETY DEPRESSION 06/12/2009  . Cancer ([place])    skin cx/ hx melanoma  . CKD (chronic kidney disease) stage 2, GFR 60-89 ml/min   . COLONIC POLYPS, HX OF 03/08/2002  . 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 Dementia   . DIVERTICULOSIS, COLON 08/04/2007  . GERD 06/23/2007  . HYPERLIPIDEMIA 06/24/2007  . HYPERTENSION 06/23/2007  . Irritable bowel syndrome 06/23/2007  . LOW BACK PAIN 06/24/2007  . MELANOMA, HX OF 08/04/2007  . Memory dysfunction 06/23/2011    Social History  Substance Use Topics  . Smoking status: Never Smoker  . Smokeless tobacco: Never Used  . Alcohol use 25.2 oz/week    42 Glasses of wine per week     Comment: 11/2016 " i Do not drink everyday"    Family History  Problem Relation Age of Onset  . Colon cancer Cousin   . Hypertension Other   . Diabetes Other    Allergies  Allergen Reactions  . Codeine Phosphate Nausea Only    OBJECTIVE: Physical Exam  Vitals:   12/14/16 1100 12/14/16 1200 12/14/16 1300 12/14/16 1400  BP:  126/68  104/60  Pulse: 77 (!) 33 68 67  Resp: (!) 23 14 19 20   Temp:      TempSrc:      SpO2: 100% 100% 100% 100%  Weight:  Height:       General: Vital signs reviewed.  Patient is chronically ill appearing, in no acute distress and cooperative with exam.  Cardiovascular: RRR Pulmonary/Chest: Clear to anterior auscultation bilaterally Abdominal: Soft, non-tender, non-distended  Extremities: No lower extremity edema bilaterally Psychiatric: Cognition and memory are abnormal.   Lab Results Lab Results  Component Value Date   WBC 9.1 12/14/2016   HGB 8.8 (L) 12/14/2016   HCT 29.4 (L) 12/14/2016   MCV 93.9 12/14/2016   PLT 104 (L) 12/14/2016    Lab Results  Component Value Date   CREATININE 2.15 (H) 12/14/2016   BUN 92 (H) 12/14/2016   NA 150 (H) 12/14/2016   K 3.2 (L) 12/14/2016   CL 120 (H) 12/14/2016   CO2 17 (L) 12/14/2016    Lab Results  Component Value Date   ALT 16 12/12/2016   AST  26 12/12/2016   ALKPHOS 66 12/12/2016   BILITOT 0.5 12/12/2016     Microbiology: Recent Results (from the past 240 hour(s))  Culture, blood (Routine x 2)     Status: Abnormal   Collection Time: 12/10/16  7:30 AM  Result Value Ref Range Status   Specimen Description BLOOD RIGHT FOREARM  Final   Special Requests BOTTLES DRAWN AEROBIC ONLY  5CC  Final   Culture  Setup Time   Final    GRAM POSITIVE COCCI IN CLUSTERS AEROBIC BOTTLE ONLY CRITICAL RESULT CALLED TO, READ BACK BY AND VERIFIED WITH: G ABBOTT, PHARMD @0728  12/11/16 MKELLY,MLT    Culture STAPHYLOCOCCUS HOMINIS (A)  Final   Report Status 12/13/2016 FINAL  Final   Organism ID, Bacteria STAPHYLOCOCCUS HOMINIS  Final      Susceptibility   Staphylococcus hominis - MIC*    CIPROFLOXACIN >=8 RESISTANT Resistant     ERYTHROMYCIN >=8 RESISTANT Resistant     GENTAMICIN <=0.5 SENSITIVE Sensitive     OXACILLIN >=4 RESISTANT Resistant     TETRACYCLINE >=16 RESISTANT Resistant     VANCOMYCIN 1 SENSITIVE Sensitive     TRIMETH/SULFA 80 RESISTANT Resistant     CLINDAMYCIN >=8 RESISTANT Resistant     RIFAMPIN <=0.5 SENSITIVE Sensitive     Inducible Clindamycin NEGATIVE Sensitive     * STAPHYLOCOCCUS HOMINIS  Blood Culture ID Panel (Reflexed)     Status: Abnormal   Collection Time: 12/10/16  7:30 AM  Result Value Ref Range Status   Enterococcus species NOT DETECTED NOT DETECTED Final   Listeria monocytogenes NOT DETECTED NOT DETECTED Final   Staphylococcus species DETECTED (A) NOT DETECTED Final    Comment: Methicillin (oxacillin) resistant coagulase negative staphylococcus. Possible blood culture contaminant (unless isolated from more than one blood culture draw or clinical case suggests pathogenicity). No antibiotic treatment is indicated for blood  culture contaminants. CRITICAL RESULT CALLED TO, READ BACK BY AND VERIFIED WITH: G ABBOTT, PHARMD @0728  12/11/16 MKELLY,MLT    Staphylococcus aureus NOT DETECTED NOT DETECTED Final    Methicillin resistance DETECTED (A) NOT DETECTED Final    Comment: CRITICAL RESULT CALLED TO, READ BACK BY AND VERIFIED WITH: G ABBOTT,PHARMD @0728  12/11/16 MKELLY,MLT    Streptococcus species NOT DETECTED NOT DETECTED Final   Streptococcus agalactiae NOT DETECTED NOT DETECTED Final   Streptococcus pneumoniae NOT DETECTED NOT DETECTED Final   Streptococcus pyogenes NOT DETECTED NOT DETECTED Final   Acinetobacter baumannii NOT DETECTED NOT DETECTED Final   Enterobacteriaceae species NOT DETECTED NOT DETECTED Final   Enterobacter cloacae complex NOT DETECTED NOT DETECTED Final   Escherichia coli NOT DETECTED  NOT DETECTED Final   Klebsiella oxytoca NOT DETECTED NOT DETECTED Final   Klebsiella pneumoniae NOT DETECTED NOT DETECTED Final   Proteus species NOT DETECTED NOT DETECTED Final   Serratia marcescens NOT DETECTED NOT DETECTED Final   Haemophilus influenzae NOT DETECTED NOT DETECTED Final   Neisseria meningitidis NOT DETECTED NOT DETECTED Final   Pseudomonas aeruginosa NOT DETECTED NOT DETECTED Final   Candida albicans NOT DETECTED NOT DETECTED Final   Candida glabrata NOT DETECTED NOT DETECTED Final   Candida krusei NOT DETECTED NOT DETECTED Final   Candida parapsilosis NOT DETECTED NOT DETECTED Final   Candida tropicalis NOT DETECTED NOT DETECTED Final  Culture, blood (Routine x 2)     Status: Abnormal   Collection Time: 12/10/16  7:35 AM  Result Value Ref Range Status   Specimen Description BLOOD RIGHT HAND  Final   Special Requests IN PEDIATRIC BOTTLE 4CC  Final   Culture  Setup Time   Final    GRAM POSITIVE COCCI IN CLUSTERS AEROBIC BOTTLE ONLY CRITICAL VALUE NOTED.  VALUE IS CONSISTENT WITH PREVIOUSLY REPORTED AND CALLED VALUE.    Culture (A)  Final    STAPHYLOCOCCUS EPIDERMIDIS THE SIGNIFICANCE OF ISOLATING THIS ORGANISM FROM A SINGLE SET OF BLOOD CULTURES WHEN MULTIPLE SETS ARE DRAWN IS UNCERTAIN. PLEASE NOTIFY THE MICROBIOLOGY DEPARTMENT WITHIN ONE WEEK IF SPECIATION AND  SENSITIVITIES ARE REQUIRED. STAPHYLOCOCCUS HOMINIS SUSCEPTIBILITIES PERFORMED ON PREVIOUS CULTURE WITHIN THE LAST 5 DAYS.    Report Status 12/13/2016 FINAL  Final  Urine culture     Status: Abnormal   Collection Time: 12/10/16  9:52 AM  Result Value Ref Range Status   Specimen Description URINE, RANDOM  Final   Special Requests NONE  Final   Culture >=100,000 COLONIES/mL PSEUDOMONAS AERUGINOSA (A)  Final   Report Status 12/12/2016 FINAL  Final   Organism ID, Bacteria PSEUDOMONAS AERUGINOSA (A)  Final      Susceptibility   Pseudomonas aeruginosa - MIC*    CEFTAZIDIME 4 SENSITIVE Sensitive     CIPROFLOXACIN <=0.25 SENSITIVE Sensitive     GENTAMICIN <=1 SENSITIVE Sensitive     IMIPENEM 2 SENSITIVE Sensitive     PIP/TAZO <=4 SENSITIVE Sensitive     CEFEPIME 2 SENSITIVE Sensitive     * >=100,000 COLONIES/mL PSEUDOMONAS AERUGINOSA  MRSA PCR Screening     Status: None   Collection Time: 12/10/16 11:39 AM  Result Value Ref Range Status   MRSA by PCR NEGATIVE NEGATIVE Final    Comment:        The GeneXpert MRSA Assay (FDA approved for NASAL specimens only), is one component of a comprehensive MRSA colonization surveillance program. It is not intended to diagnose MRSA infection nor to guide or monitor treatment for MRSA infections.   Culture, blood (routine x 2)     Status: None (Preliminary result)   Collection Time: 12/12/16  4:28 AM  Result Value Ref Range Status   Specimen Description BLOOD LEFT HAND  Final   Special Requests IN PEDIATRIC BOTTLE 3CC  Final   Culture NO GROWTH 1 DAY  Final   Report Status PENDING  Incomplete  Culture, blood (routine x 2)     Status: Abnormal (Preliminary result)   Collection Time: 12/12/16  4:30 AM  Result Value Ref Range Status   Specimen Description BLOOD RIGHT HAND  Final   Special Requests IN PEDIATRIC BOTTLE 0.5CC  Final   Culture  Setup Time   Final    GRAM POSITIVE COCCI IN CLUSTERS IN PEDIATRIC  BOTTLE CRITICAL RESULT CALLED TO,  READ BACK BY AND VERIFIED WITH: Hughie Closs 12/13/16 @ 66 M VESTAL    Culture (A)  Final    STAPHYLOCOCCUS EPIDERMIDIS SUSCEPTIBILITIES TO FOLLOW    Report Status PENDING  Incomplete  Blood Culture ID Panel (Reflexed)     Status: Abnormal   Collection Time: 12/12/16  4:30 AM  Result Value Ref Range Status   Enterococcus species NOT DETECTED NOT DETECTED Final   Listeria monocytogenes NOT DETECTED NOT DETECTED Final   Staphylococcus species DETECTED (A) NOT DETECTED Final    Comment: Methicillin (oxacillin) resistant coagulase negative staphylococcus. Possible blood culture contaminant (unless isolated from more than one blood culture draw or clinical case suggests pathogenicity). No antibiotic treatment is indicated for blood  culture contaminants. CRITICAL RESULT CALLED TO, READ BACK BY AND VERIFIED WITH: Hughie Closs 12/13/16 @ 73 M VESTAL    Staphylococcus aureus NOT DETECTED NOT DETECTED Final   Methicillin resistance DETECTED (A) NOT DETECTED Final    Comment: CRITICAL RESULT CALLED TO, READ BACK BY AND VERIFIED WITH: Hughie Closs 12/13/16 @ 31 M VESTAL    Streptococcus species NOT DETECTED NOT DETECTED Final   Streptococcus agalactiae NOT DETECTED NOT DETECTED Final   Streptococcus pneumoniae NOT DETECTED NOT DETECTED Final   Streptococcus pyogenes NOT DETECTED NOT DETECTED Final   Acinetobacter baumannii NOT DETECTED NOT DETECTED Final   Enterobacteriaceae species NOT DETECTED NOT DETECTED Final   Enterobacter cloacae complex NOT DETECTED NOT DETECTED Final   Escherichia coli NOT DETECTED NOT DETECTED Final   Klebsiella oxytoca NOT DETECTED NOT DETECTED Final   Klebsiella pneumoniae NOT DETECTED NOT DETECTED Final   Proteus species NOT DETECTED NOT DETECTED Final   Serratia marcescens NOT DETECTED NOT DETECTED Final   Haemophilus influenzae NOT DETECTED NOT DETECTED Final   Neisseria meningitidis NOT DETECTED NOT DETECTED Final   Pseudomonas aeruginosa NOT DETECTED NOT DETECTED  Final   Candida albicans NOT DETECTED NOT DETECTED Final   Candida glabrata NOT DETECTED NOT DETECTED Final   Candida krusei NOT DETECTED NOT DETECTED Final   Candida parapsilosis NOT DETECTED NOT DETECTED Final   Candida tropicalis NOT DETECTED NOT DETECTED Final  Culture, blood (routine x 2)     Status: None (Preliminary result)   Collection Time: 12/14/16 10:00 AM  Result Value Ref Range Status   Specimen Description BLOOD LEFT FINGER  Final   Special Requests IN PEDIATRIC BOTTLE 1CC  Final   Culture PENDING  Incomplete   Report Status PENDING  Incomplete     ASSESSMENT: 1. Sacral osteomyelitis with CoNS bacteremia: Patient has been afebrile for 72 hours and leukocytosis has resolved. Blood cultures from 3/18 grew gram-positive cocci in 1/2. Repeat blood cultures are ordered for today. Palliative care was able to meet with the patient's healthcare power of attorneys, her sons. They wish to continue current medical management, medically optimize her and send her back to [place], but not escalate care. Patient is now DNR and will go home with hospice. They do not want for their mother to be readmitted to the hospital if she were to decline again at [facility]. Unfortunately, her osteomyelitis is unlikely to ever fully heal given her poor nutritional status, bedbound nature secondary to chronic bilateral hip fractures, severe dementia and likely incontinence of urine and stool.  PLAN: 1. Given patient's overall picture, we do not recommend continuing IV antibiotics as outpatient if goal is to focus on comfort. Her sacral osteomyelitis is unlikely to fully heal and prolonged  IV therapy may increase risk of complications without increasing comfort. If aggressive medical therapy were to be pursued, we would continue ceftazidime and vancomycin for 6 weeks following negative blood cultures. Place PICC line once blood cultures have been negative. Blood cultures reordered for 3/20, will follow. There  is no role for bone biopsy or TTE in this situation. 2. Discussed the above plan with palliative care. They will discuss with the family the issue of prolonged antibiotic therapy. Certainly, if family wishes to focus on comfort we could discontinue antibiotics and avoid placing a PICC line.  Martyn Malay, DO PGY-3 Internal Medicine Resident Pager # [phone] 12/14/2016 2:32 PM

## 2016-12-15 DIAGNOSIS — R7881 Bacteremia: Secondary | ICD-10-CM

## 2016-12-15 DIAGNOSIS — F0150 Vascular dementia without behavioral disturbance: Secondary | ICD-10-CM

## 2016-12-15 DIAGNOSIS — E870 Hyperosmolality and hypernatremia: Secondary | ICD-10-CM

## 2016-12-15 DIAGNOSIS — N182 Chronic kidney disease, stage 2 (mild): Secondary | ICD-10-CM

## 2016-12-15 DIAGNOSIS — N179 Acute kidney failure, unspecified: Secondary | ICD-10-CM

## 2016-12-15 LAB — CULTURE, BLOOD (ROUTINE X 2)

## 2016-12-15 LAB — BASIC METABOLIC PANEL
Anion gap: 11 (ref 5–15)
BUN: 72 mg/dL — AB (ref 6–20)
CALCIUM: 7.5 mg/dL — AB (ref 8.9–10.3)
CHLORIDE: 118 mmol/L — AB (ref 101–111)
CO2: 15 mmol/L — ABNORMAL LOW (ref 22–32)
CREATININE: 1.82 mg/dL — AB (ref 0.44–1.00)
GFR, EST AFRICAN AMERICAN: 32 mL/min — AB (ref >60)
GFR, EST NON AFRICAN AMERICAN: 27 mL/min — AB (ref >60)
Glucose, Bld: 91 mg/dL (ref 65–99)
Potassium: 2.5 mmol/L — CL (ref 3.5–5.1)
Sodium: 144 mmol/L (ref 135–145)

## 2016-12-15 LAB — CBC
HEMATOCRIT: 27.1 % — AB (ref 36.0–46.0)
Hemoglobin: 8.2 g/dL — ABNORMAL LOW (ref 12.0–15.0)
MCH: 27.8 pg (ref 26.0–34.0)
MCHC: 30.3 g/dL (ref 30.0–36.0)
MCV: 91.9 fL (ref 78.0–100.0)
PLATELETS: 91 10*3/uL — AB (ref 150–400)
RBC: 2.95 MIL/uL — ABNORMAL LOW (ref 3.87–5.11)
RDW: 15.2 % (ref 11.5–15.5)
WBC: 7.2 10*3/uL (ref 4.0–10.5)

## 2016-12-15 LAB — HEPARIN LEVEL (UNFRACTIONATED): Heparin Unfractionated: 0.39 IU/mL (ref 0.30–0.70)

## 2016-12-15 MED ORDER — ONDANSETRON 8 MG PO TBDP
8.0000 mg | ORAL_TABLET | Freq: Three times a day (TID) | ORAL | Status: DC | PRN
  Filled 2016-12-15: qty 1

## 2016-12-15 MED ORDER — GLYCOPYRROLATE 1 MG PO TABS: 1.0000 mg | ORAL_TABLET | ORAL | 0 refills | Status: DC | PRN

## 2016-12-15 MED ORDER — LORAZEPAM 1 MG PO TABS: 1.0000 mg | ORAL_TABLET | ORAL | 0 refills | Status: DC | PRN

## 2016-12-15 MED ORDER — HALOPERIDOL LACTATE 2 MG/ML PO CONC
0.5000 mg | ORAL | Status: DC | PRN
  Filled 2016-12-15: qty 0.3

## 2016-12-15 MED ORDER — GLYCOPYRROLATE 1 MG PO TABS: 1.0000 mg | ORAL_TABLET | ORAL | Status: DC | PRN

## 2016-12-15 MED ORDER — OXYCODONE HCL 5 MG/5ML PO SOLN: 5.0000 mg | ORAL | 0 refills | Status: DC | PRN

## 2016-12-15 MED ORDER — HALOPERIDOL LACTATE 2 MG/ML PO CONC: 0.5000 mg | ORAL | 0 refills | Status: DC | PRN

## 2016-12-15 MED ORDER — HALOPERIDOL LACTATE 5 MG/ML IJ SOLN: 0.5000 mg | INTRAMUSCULAR | Status: DC | PRN

## 2016-12-15 MED ORDER — ONDANSETRON 4 MG PO TBDP: 4.0000 mg | ORAL_TABLET | Freq: Three times a day (TID) | ORAL | Status: DC | PRN

## 2016-12-15 MED ORDER — ONDANSETRON HCL 4 MG/2ML IJ SOLN: 4.0000 mg | Freq: Four times a day (QID) | INTRAMUSCULAR | Status: DC | PRN

## 2016-12-15 MED ORDER — POLYVINYL ALCOHOL 1.4 % OP SOLN: 1.0000 [drp] | Freq: Four times a day (QID) | OPHTHALMIC | 0 refills | Status: AC | PRN

## 2016-12-15 MED ORDER — ONDANSETRON 4 MG PO TBDP
4.0000 mg | ORAL_TABLET | Freq: Four times a day (QID) | ORAL | Status: DC | PRN
  Filled 2016-12-15: qty 1

## 2016-12-15 MED ORDER — GLYCOPYRROLATE 0.2 MG/ML IJ SOLN: 0.2000 mg | INTRAMUSCULAR | Status: DC | PRN

## 2016-12-15 MED ORDER — POLYVINYL ALCOHOL 1.4 % OP SOLN
1.0000 [drp] | Freq: Four times a day (QID) | OPHTHALMIC | Status: DC | PRN
  Filled 2016-12-15: qty 15

## 2016-12-15 MED ORDER — HALOPERIDOL 0.5 MG PO TABS
0.5000 mg | ORAL_TABLET | ORAL | Status: DC | PRN
  Filled 2016-12-15: qty 1

## 2016-12-15 MED ORDER — BIOTENE DRY MOUTH MT LIQD: 15.0000 mL | OROMUCOSAL | Status: DC | PRN

## 2016-12-15 MED ORDER — VANCOMYCIN HCL 500 MG IV SOLR
500.0000 mg | INTRAVENOUS | Status: DC
Start: 2016-12-15 — End: 2016-12-15
  Filled 2016-12-15: qty 500

## 2016-12-15 MED ORDER — OXYCODONE HCL 5 MG/5ML PO SOLN
5.0000 mg | ORAL | Status: DC | PRN
  Administered 2016-12-15: 5 mg via ORAL
  Filled 2016-12-15: qty 5

## 2016-12-15 MED ORDER — POTASSIUM CHLORIDE CRYS ER 20 MEQ PO TBCR
40.0000 meq | EXTENDED_RELEASE_TABLET | Freq: Once | ORAL | Status: AC
  Administered 2016-12-15: 40 meq via ORAL
  Filled 2016-12-15: qty 2

## 2016-12-15 MED ORDER — HALOPERIDOL LACTATE 2 MG/ML PO CONC
1.0000 mg | Freq: Four times a day (QID) | ORAL | Status: DC | PRN
  Filled 2016-12-15: qty 0.5

## 2016-12-15 MED ORDER — SODIUM CHLORIDE 0.9 % IV SOLN
30.0000 meq | Freq: Once | INTRAVENOUS | Status: DC
  Administered 2016-12-15: 30 meq via INTRAVENOUS
  Filled 2016-12-15: qty 15

## 2016-12-15 MED ORDER — LORAZEPAM 1 MG PO TABS: 1.0000 mg | ORAL_TABLET | ORAL | Status: DC | PRN

## 2016-12-15 NOTE — Progress Notes (Signed)
Informed by social work (who is spoken with palliative care) that the family is okay with patient returning to[facility] with hospice/comfort care. Palliative care also okay with this. Being discharged back to SNF. See discharge summary for further details.

## 2016-12-15 NOTE — Progress Notes (Signed)
[facility] has transported pt to [facility]. No belongings found with pt. Pt has now been discharged from hospital.

## 2016-12-15 NOTE — Progress Notes (Addendum)
Daily Progress Note   Patient Name: Tara Shaffer       Date: 12/15/2016 DOB: [birth year]  Age: 71 y.o. MRN#: [id number] Attending Physician: Jonetta Osgood, MD Primary Care Physician: [name], MD Admit Date: 12/10/2016  Reason for Consultation/Follow-up: Establishing goals of care  Subjective: Spoke with [name] and [name]) individually this morning regarding patient's poor prognosis and poor likelihood of beneficial outcome with prolonged antibiotic therapy. Patient is unable to undergo diverting colostomy that is required to keep sacral decubitus free from infection that would be required for wound healing. Antibiotic therapy would require PICC placement and 6 weeks of IV therapy. Patient unable to have PICC at this time due to positive blood cultures. Additionally, she has significantly decreased cognitive, functional, and nutritional status-  bedbound, non-healing femur fracture, albumin 1.9, declining most po intake. Given poor quality of life, and likelihood that aggressive therapies will increase functional status or improve her quality of life, decisions are made to transition to comfort care and they request residential hospice placement for symptom management if possible. They had a Grandfather who died at [facility] in [place] and they are requesting placement for their Mom here as well.  Evaluated patient in bed. She was calling out for help. When I asked her what I could do her to help her she stared blankly. I suspect she is having pain that she cannot communicate. Per nursing report she took sips of coffee this morning but no other po intake.   Review of Systems  Unable to perform ROS: Dementia    Length of Stay: 5  Current Medications: Scheduled Meds:  .  cefTAZidime (FORTAZ)  IV  1 g Intravenous Q24H  . chlorhexidine  15 mL Mouth Rinse BID  . Chlorhexidine Gluconate Cloth  6 each Topical Q0600  . mouth rinse  15 mL Mouth Rinse q12n4p  . mupirocin ointment  1 application Nasal BID  . potassium chloride (KCL MULTIRUN) 30 mEq in 265 mL IVPB  30 mEq Intravenous Once  . sodium chloride flush  3 mL Intravenous Q12H  . valproate sodium  250 mg Intravenous Q8H  . vancomycin  500 mg Intravenous Q24H    Continuous Infusions: . dextrose 75 mL/hr at 12/15/16 0619  . heparin 700 Units/hr (12/14/16 0800)    PRN Meds: acetaminophen **OR**  acetaminophen, RESOURCE THICKENUP CLEAR  Physical Exam  Constitutional:  Appears much older than stated age, frail, cachetic  Eyes:  watery  Cardiovascular:  Bradycardic  Pulmonary/Chest: Effort normal.  Abdominal: Soft.  Musculoskeletal:  contracted  Neurological: She is alert.  Oriented to person and place  Skin: Skin is warm and dry. There is pallor.  Psychiatric:  Flat affect, inappropriate thought content at times            Vital Signs: BP (!) 105/54 (BP Location: Right Arm)   Pulse (!) 56   Temp (!) 96.7 F (35.9 C) (Axillary)   Resp 16   Ht 5\' 7"  (1.702 m)   Wt 44.5 kg (98 lb)   SpO2 100%   BMI 15.35 kg/m  SpO2: SpO2: 100 % O2 Device: O2 Device: Nasal Cannula O2 Flow Rate: O2 Flow Rate (L/min): 1 L/min  Intake/output summary:   Intake/Output Summary (Last 24 hours) at 12/15/16 5621 Last data filed at 12/15/16 3086  Gross per 24 hour  Intake          1429.25 ml  Output                4 ml  Net          1425.25 ml   LBM: Last BM Date: 12/14/16 Baseline Weight: Weight: 47.6 kg (105 lb) Most recent weight: Weight: 44.5 kg (98 lb)       Palliative Assessment/Data: PPS: 10%   Flowsheet Rows     Most Recent Value  Intake Tab  Referral Department  Hospitalist  Unit at Time of Referral  Cardiac/Telemetry Unit  Palliative Care Primary Diagnosis  Sepsis/Infectious Disease    Date Notified  12/11/16  Palliative Care Type  New Palliative care  Reason for referral  Clarify Goals of Care  Date of Admission  12/10/16  Date first seen by Palliative Care  12/12/16  # of days Palliative referral response time  1 Day(s)  # of days IP prior to Palliative referral  1  Clinical Assessment  Psychosocial & Spiritual Assessment  Palliative Care Outcomes      Patient Active Problem List   Diagnosis Date Noted  . Dementia without behavioral disturbance   . Sacral osteomyelitis ([place]) 12/13/2016  . Coag negative Staphylococcus bacteremia 12/13/2016  . Hypernatremia 12/10/2016  . Adnexal mass   . Stage IV pressure ulcer of sacral region ([place]) 11/23/2016  . Intertrochanteric fracture of left hip ([place]) 08/27/2016  . Protein-calorie malnutrition, severe 08/23/2016  . ARF (acute renal failure) ([place]) 08/21/2016  . Anemia of chronic disease 08/29/2015  . DVT, femoral, chronic ([place]) 03/20/2015  . Alcohol abuse 02/27/2012  . Dementia with behavioral disturbance 06/23/2011  . 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 DIVERTICULOSIS, COLON 08/04/2007  . MELANOMA, HX OF 08/04/2007  . HLD (hyperlipidemia) 06/24/2007  . Anxiety state 06/23/2007  . Essential hypertension 06/23/2007  . ALLERGIC RHINITIS 06/23/2007  . GERD 06/23/2007  . Irritable bowel syndrome 06/23/2007    Palliative Care Assessment & Plan   Patient Profile: 71 y.o. female  with past medical history of dementia, HTN, decubitis, anxiety w/ depression, adnexal mass, alcohol abuse, melanoma, DJD- cervical spine, GERD, HLD, IBS, compression fractures, CKD stage II, urinary retention, L femur intertrochanteric fracture (was being treated conservatively w/ Ultram 50mg  q8hrs), chronic left femoral DVT admitted on 12/10/2016 from [facility] facility after being found unresponsive. Required bipap in ED. Now back to baseline. Workup thus far reveals sepsis and hypernatremia, AKI, psuedomonas  UTI, BC  + for coag neg staph, surgical consult for Stage IV decubitis- MRI for possible osteomyelitis pending. Overall prognosis very poor. Palliative medicine consulted for [place]. Decision made for comfort care and residential hospice.   Assessment/Recommendations/Plan   DNR  Stop all medications and IV fluids not focused on comfort  Stop O2 via [place]  Social work referral for residential hospice evaluation- family requests [facility]  Full comfort care  Comfort medications-  Roxanol (will give instead of morphine d/t decreased kidney function)- 5mg  q2hr prn pain, SOB  Haldol 1mg  po q6hr prn agitation  Lorazapam 1mg  SL q4hr prn anxiety  Ondansetron 8mg  q8hr prn nausea      Goals of Care and Additional Recommendations:  Limitations on Scope of Treatment: Full Comfort Care and No Surgical Procedures  Code Status:  DNR  Prognosis:   < 2 weeks d/t transitioning to comfort care measures only, sepsis, osteomyelitis, no further interventions, no po intake  Discharge Planning:  Hospice facility pending evaluation and bed availability - thank you.  Care plan was discussed with patient's two sons [name] and Delfino [facility]ett [facility]).   Thank you for allowing the Palliative Medicine Team to assist in the care of this patient.   Total Time: 45 minutes  Greater than 50%  of this time was spent counseling and coordinating care related to the above assessment and plan.  Mariana Kaufman, AGNP-C Palliative Medicine   Please call Palliative Medicine team phone with any questions [phone]. For individual providers please see AMION.

## 2016-12-15 NOTE — Care Management Note (Signed)
Case Management Note  Patient Details  Name: Tara Shaffer MRN: [id number] Date of Birth: [birth year]  Subjective/Objective:   Pt presented for Sepsis. Plan for d/c to [facility] SNF today.                 Action/Plan: CSW assisting with disposition needs.   Expected Discharge Date:  12/15/16               Expected Discharge Plan:  [place]  In-House Referral:  Clinical Social Work  Discharge planning Services  NA  Post Acute Care Choice:  NA Choice offered to:  NA  DME Arranged:  [name] DME Agency:  NA  HH Arranged:  NA HH Agency:  NA  Status of Service:  Completed, signed off  If discussed at [employer] of Stay Meetings, dates discussed:    Additional Comments:  Bethena Roys, RN 12/15/2016, 4:22 PM

## 2016-12-15 NOTE — Progress Notes (Signed)
PROGRESS NOTE        PATIENT DETAILS Name: Tara Shaffer Age: 71 y.o. Sex: female Date of Birth: Jun 05, 1946 Admit Date: 12/10/2016 Admitting Physician Elwin Mocha, MD TLX:[id number] Jenny Reichmann, MD  Brief Narrative: Patient is a 71 y.o. female with past medical history of dementia-bedridden-ST of sacral decubitus ulcer my recent DVT, admitted for sepsis, further evaluation revealed coag-negative staph bacteremia and underlying sacral osteomyelitis.Initially on broad-spectrum antibiotics, however given her overall poor prognosis, palliative care was consulted-she has not been transitioned to full comfort measures.   Subjective: Lying comfortably in bed-appears to have contractures in the left upper extremity.  Assessment/Plan: Sepsis secondary to coagulase-negative bacteremia, and sacral osteomyelitis: Initially on broad-spectrum antibiotics, however after discussion with palliative care, family has not decided to transition to full comfort measures. Off all IV antibiotics. Agree, that patient has a very poor overall prognosis, best served by being discharged to residential hospice/full comfort measures..  Hypernatremia: Clearly secondary to dehydration, electrolytes better after hypotonic fluid administration. Since transitioned to comfort measures, no further lab monitoring required.  Acute kidney injury: Likely prerenal azotemia-as improving with IV fluids. Since admission to full comfort measures, doubt any further lab monitoring required.  Dementia with behavioral disturbance: Supportive care  Recent DVT in January 2018: Initially maintained on IV heparin-since transitioned to comfort measures-no longer on anticoagulation.  Stage IV sacral ulcer: Present prior to hospitalization-CT scan showed possible sacral osteomyelitis. Supportive care only-now on comfort measures.  Dysphagia: Seen by speech therapy-started on dysphagia 2 diet-patient now transitioned to  full comfort measures.  DVT Prophylaxis: Prophylactic Lovenox  Code Status:  DNR  Family Communication: None at bedside  Disposition Plan: Remain inpatient-hospice facility  Antimicrobial agents: Anti-infectives    Start     Dose/Rate Route Frequency Ordered Stop   12/15/16 1000  vancomycin (VANCOCIN) 500 mg in sodium chloride 0.9 % 100 mL IVPB  Status:  Discontinued     500 mg 100 mL/hr over 60 Minutes Intravenous Every 24 hours 12/15/16 0901 12/15/16 0939   12/12/16 1500  cefTAZidime (FORTAZ) 1 g in dextrose 5 % 50 mL IVPB  Status:  Discontinued     1 g 100 mL/hr over 30 Minutes Intravenous Every 24 hours 12/12/16 1431 12/15/16 0939   12/12/16 0800  vancomycin (VANCOCIN) 500 mg in sodium chloride 0.9 % 100 mL IVPB  Status:  Discontinued     500 mg 100 mL/hr over 60 Minutes Intravenous Every 48 hours 12/10/16 0925 12/15/16 0902   12/10/16 1400  piperacillin-tazobactam (ZOSYN) IVPB 2.25 g  Status:  Discontinued     2.25 g 100 mL/hr over 30 Minutes Intravenous Every 6 hours 12/10/16 0925 12/12/16 1153   12/10/16 0745  piperacillin-tazobactam (ZOSYN) IVPB 3.375 g     3.375 g 100 mL/hr over 30 Minutes Intravenous  Once 12/10/16 0735 12/10/16 0826   12/10/16 0745  vancomycin (VANCOCIN) IVPB 1000 mg/200 mL premix     1,000 mg 200 mL/hr over 60 Minutes Intravenous  Once 12/10/16 0735 12/10/16 0856      Procedures: None  CONSULTS:  ID, general surgery and Palliative care  Time spent: 25- minutes-Greater than 50% of this time was spent in counseling, explanation of diagnosis, planning of further management, and coordination of care.  MEDICATIONS: Scheduled Meds: . mouth rinse  15 mL Mouth Rinse q12n4p   Continuous Infusions: PRN Meds:.acetaminophen **OR**  acetaminophen, antiseptic oral rinse, glycopyrrolate **OR** glycopyrrolate **OR** glycopyrrolate, haloperidol **OR** haloperidol **OR** haloperidol lactate, LORazepam, ondansetron **OR** ondansetron (ZOFRAN) IV,  oxyCODONE, polyvinyl alcohol   PHYSICAL EXAM: Vital signs: Vitals:   12/15/16 0442 12/15/16 0805 12/15/16 0845 12/15/16 1126  BP: (!) 105/54  127/65 123/65  Pulse: (!) 54 (!) 56 64 66  Resp: 15 16 16 20   Temp: (!) 96.5 F (35.8 C) (!) 96.7 F (35.9 C)  97.3 F (36.3 C)  TempSrc: Axillary Axillary  Oral  SpO2: 100% 100% 100% 100%  Weight: 44.5 kg (98 lb)     Height:       Filed Weights   12/11/16 0441 12/14/16 0444 12/15/16 0442  Weight: 44 kg (97 lb) 41.3 kg (91 lb) 44.5 kg (98 lb)   Body mass index is 15.35 kg/m.   General appearance :Awake, Somewhat alert-follows some commands.  Eyes:, pupils equally reactive to light and accomodation,no scleral icterus. HEENT: Atraumatic and Normocephalic Neck: supple, no JVD.  Resp:Good air entry bilaterally CVS: S1 S2 regular GI: Bowel sounds present, Non tender and not distended   I have personally reviewed following labs and imaging studies  LABORATORY DATA: CBC:  Recent Labs Lab 12/10/16 0730  12/11/16 0255 12/12/16 0428 12/13/16 0315 12/14/16 0423 12/15/16 0611  WBC 19.9*  --  14.8* 14.2* 10.5 9.1 7.2  NEUTROABS 16.9*  --   --  11.6*  --   --   --   HGB 12.5  --  8.9* 9.5* 8.7* 8.8* 8.2*  HCT 44.7  < > 31.8* 33.1* 29.5* 29.4* 27.1*  MCV 100.0  --  100.0 97.6 94.9 93.9 91.9  PLT 219  --  107* 107* 110* 104* 91*  < > = values in this interval not displayed.  Basic Metabolic Panel:  Recent Labs Lab 12/10/16 1332  12/11/16 0255  12/12/16 0428 12/12/16 1532 12/13/16 0315 12/14/16 0423 12/15/16 0611  NA  --   < > 169*  < > 156* 152* 151* 150* 144  K  --   < > 3.2*  < > 3.4* 3.2* 2.9* 3.2* 2.5*  CL  --   < > 130*  < > 125* 122* 121* 120* 118*  CO2  --   < > 25  < > 19* 18* 18* 17* 15*  GLUCOSE  --   < > 304*  < > 162* 137* 103* 77 91  BUN  --   < > 183*  < > 148* 127* 111* 92* 72*  CREATININE  --   < > 4.12*  < > 3.21* 2.83* 2.54* 2.15* 1.82*  CALCIUM  --   < > 7.5*  < > 8.0* 7.7* 7.7* 7.9* 7.5*  MG 2.9*  --   2.6*  --  2.3  --  2.0  --   --   PHOS 5.2*  --  4.2  --   --   --   --   --   --   < > = values in this interval not displayed.  GFR: Estimated Creatinine Clearance: 20.5 mL/min (A) (by C-G formula based on SCr of 1.82 mg/dL (H)).  Liver Function Tests:  Recent Labs Lab 12/10/16 0730 12/11/16 0255 12/12/16 0428  AST 38 26 26  ALT 20 18 16   ALKPHOS 68 56 66  BILITOT 0.9 0.6 0.5  PROT 7.3 5.4* 5.7*  ALBUMIN 2.7* 1.9* 1.9*    Recent Labs Lab 12/10/16 0730  LIPASE 55*    Recent Labs Lab 12/10/16  1927  AMMONIA 26    Coagulation Profile:  Recent Labs Lab 12/10/16 0730 12/12/16 0428  INR 1.35 1.14    Cardiac Enzymes: No results for input(s): CKTOTAL, CKMB, CKMBINDEX, TROPONINI in the last 168 hours.  BNP (last 3 results) No results for input(s): PROBNP in the last 8760 hours.  HbA1C: No results for input(s): HGBA1C in the last 72 hours.  CBG: No results for input(s): GLUCAP in the last 168 hours.  Lipid Profile: No results for input(s): CHOL, HDL, LDLCALC, TRIG, CHOLHDL, LDLDIRECT in the last 72 hours.  Thyroid Function Tests: No results for input(s): TSH, T4TOTAL, FREET4, T3FREE, THYROIDAB in the last 72 hours.  Anemia Panel: No results for input(s): VITAMINB12, FOLATE, FERRITIN, TIBC, IRON, RETICCTPCT in the last 72 hours.  Urine analysis:    Component Value Date/Time   COLORURINE AMBER (A) 12/10/2016 0859   APPEARANCEUR TURBID (A) 12/10/2016 0859   LABSPEC 1.019 12/10/2016 0859   PHURINE 5.0 12/10/2016 0859   GLUCOSEU 50 (A) 12/10/2016 0859   GLUCOSEU NEGATIVE 09/06/2012 0858   HGBUR SMALL (A) 12/10/2016 0859   BILIRUBINUR NEGATIVE 12/10/2016 0859   KETONESUR NEGATIVE 12/10/2016 0859   PROTEINUR 100 (A) 12/10/2016 0859   UROBILINOGEN 1.0 03/13/2015 1747   NITRITE NEGATIVE 12/10/2016 0859   LEUKOCYTESUR MODERATE (A) 12/10/2016 0859    Sepsis Labs: Lactic Acid, Venous    Component Value Date/Time   LATICACIDVEN 3.3 (HH) 12/12/2016 1531     MICROBIOLOGY: Recent Results (from the past 240 hour(s))  Culture, blood (Routine x 2)     Status: Abnormal   Collection Time: 12/10/16  7:30 AM  Result Value Ref Range Status   Specimen Description BLOOD RIGHT FOREARM  Final   Special Requests BOTTLES DRAWN AEROBIC ONLY  5CC  Final   Culture  Setup Time   Final    GRAM POSITIVE COCCI IN CLUSTERS AEROBIC BOTTLE ONLY CRITICAL RESULT CALLED TO, READ BACK BY AND VERIFIED WITH: G ABBOTT, PHARMD @0728  12/11/16 MKELLY,MLT    Culture STAPHYLOCOCCUS HOMINIS (A)  Final   Report Status 12/13/2016 FINAL  Final   Organism ID, Bacteria STAPHYLOCOCCUS HOMINIS  Final      Susceptibility   Staphylococcus hominis - MIC*    CIPROFLOXACIN >=8 RESISTANT Resistant     ERYTHROMYCIN >=8 RESISTANT Resistant     GENTAMICIN <=0.5 SENSITIVE Sensitive     OXACILLIN >=4 RESISTANT Resistant     TETRACYCLINE >=16 RESISTANT Resistant     VANCOMYCIN 1 SENSITIVE Sensitive     TRIMETH/SULFA 80 RESISTANT Resistant     CLINDAMYCIN >=8 RESISTANT Resistant     RIFAMPIN <=0.5 SENSITIVE Sensitive     Inducible Clindamycin NEGATIVE Sensitive     * STAPHYLOCOCCUS HOMINIS  Blood Culture ID Panel (Reflexed)     Status: Abnormal   Collection Time: 12/10/16  7:30 AM  Result Value Ref Range Status   Enterococcus species NOT DETECTED NOT DETECTED Final   Listeria monocytogenes NOT DETECTED NOT DETECTED Final   Staphylococcus species DETECTED (A) NOT DETECTED Final    Comment: Methicillin (oxacillin) resistant coagulase negative staphylococcus. Possible blood culture contaminant (unless isolated from more than one blood culture draw or clinical case suggests pathogenicity). No antibiotic treatment is indicated for blood  culture contaminants. CRITICAL RESULT CALLED TO, READ BACK BY AND VERIFIED WITH: G ABBOTT, PHARMD @0728  12/11/16 MKELLY,MLT    Staphylococcus aureus NOT DETECTED NOT DETECTED Final   Methicillin resistance DETECTED (A) NOT DETECTED Final    Comment:  CRITICAL RESULT CALLED TO,  READ BACK BY AND VERIFIED WITH: G ABBOTT,PHARMD @0728  12/11/16 MKELLY,MLT    Streptococcus species NOT DETECTED NOT DETECTED Final   Streptococcus agalactiae NOT DETECTED NOT DETECTED Final   Streptococcus pneumoniae NOT DETECTED NOT DETECTED Final   Streptococcus pyogenes NOT DETECTED NOT DETECTED Final   Acinetobacter baumannii NOT DETECTED NOT DETECTED Final   Enterobacteriaceae species NOT DETECTED NOT DETECTED Final   Enterobacter cloacae complex NOT DETECTED NOT DETECTED Final   Escherichia coli NOT DETECTED NOT DETECTED Final   Klebsiella oxytoca NOT DETECTED NOT DETECTED Final   Klebsiella pneumoniae NOT DETECTED NOT DETECTED Final   Proteus species NOT DETECTED NOT DETECTED Final   Serratia marcescens NOT DETECTED NOT DETECTED Final   Haemophilus influenzae NOT DETECTED NOT DETECTED Final   Neisseria meningitidis NOT DETECTED NOT DETECTED Final   Pseudomonas aeruginosa NOT DETECTED NOT DETECTED Final   Candida albicans NOT DETECTED NOT DETECTED Final   Candida glabrata NOT DETECTED NOT DETECTED Final   Candida krusei NOT DETECTED NOT DETECTED Final   Candida parapsilosis NOT DETECTED NOT DETECTED Final   Candida tropicalis NOT DETECTED NOT DETECTED Final  Culture, blood (Routine x 2)     Status: Abnormal   Collection Time: 12/10/16  7:35 AM  Result Value Ref Range Status   Specimen Description BLOOD RIGHT HAND  Final   Special Requests IN PEDIATRIC BOTTLE 4CC  Final   Culture  Setup Time   Final    GRAM POSITIVE COCCI IN CLUSTERS AEROBIC BOTTLE ONLY CRITICAL VALUE NOTED.  VALUE IS CONSISTENT WITH PREVIOUSLY REPORTED AND CALLED VALUE.    Culture (A)  Final    STAPHYLOCOCCUS EPIDERMIDIS THE SIGNIFICANCE OF ISOLATING THIS ORGANISM FROM A SINGLE SET OF BLOOD CULTURES WHEN MULTIPLE SETS ARE DRAWN IS UNCERTAIN. PLEASE NOTIFY THE MICROBIOLOGY DEPARTMENT WITHIN ONE WEEK IF SPECIATION AND SENSITIVITIES ARE REQUIRED. STAPHYLOCOCCUS  HOMINIS SUSCEPTIBILITIES PERFORMED ON PREVIOUS CULTURE WITHIN THE LAST 5 DAYS.    Report Status 12/13/2016 FINAL  Final  Urine culture     Status: Abnormal   Collection Time: 12/10/16  9:52 AM  Result Value Ref Range Status   Specimen Description URINE, RANDOM  Final   Special Requests NONE  Final   Culture >=100,000 COLONIES/mL PSEUDOMONAS AERUGINOSA (A)  Final   Report Status 12/12/2016 FINAL  Final   Organism ID, Bacteria PSEUDOMONAS AERUGINOSA (A)  Final      Susceptibility   Pseudomonas aeruginosa - MIC*    CEFTAZIDIME 4 SENSITIVE Sensitive     CIPROFLOXACIN <=0.25 SENSITIVE Sensitive     GENTAMICIN <=1 SENSITIVE Sensitive     IMIPENEM 2 SENSITIVE Sensitive     PIP/TAZO <=4 SENSITIVE Sensitive     CEFEPIME 2 SENSITIVE Sensitive     * >=100,000 COLONIES/mL PSEUDOMONAS AERUGINOSA  MRSA PCR Screening     Status: None   Collection Time: 12/10/16 11:39 AM  Result Value Ref Range Status   MRSA by PCR NEGATIVE NEGATIVE Final    Comment:        The GeneXpert MRSA Assay (FDA approved for NASAL specimens only), is one component of a comprehensive MRSA colonization surveillance program. It is not intended to diagnose MRSA infection nor to guide or monitor treatment for MRSA infections.   Culture, blood (routine x 2)     Status: None (Preliminary result)   Collection Time: 12/12/16  4:28 AM  Result Value Ref Range Status   Specimen Description BLOOD LEFT HAND  Final   Special Requests IN PEDIATRIC BOTTLE 3CC  Final  Culture NO GROWTH 3 DAYS  Final   Report Status PENDING  Incomplete  Culture, blood (routine x 2)     Status: Abnormal   Collection Time: 12/12/16  4:30 AM  Result Value Ref Range Status   Specimen Description BLOOD RIGHT HAND  Final   Special Requests IN PEDIATRIC BOTTLE 0.5CC  Final   Culture  Setup Time   Final    GRAM POSITIVE COCCI IN CLUSTERS IN PEDIATRIC BOTTLE CRITICAL RESULT CALLED TO, READ BACK BY AND VERIFIED WITH: Hughie Closs 12/13/16 @ 56 M VESTAL     Culture STAPHYLOCOCCUS EPIDERMIDIS (A)  Final   Report Status 12/15/2016 FINAL  Final   Organism ID, Bacteria STAPHYLOCOCCUS EPIDERMIDIS  Final      Susceptibility   Staphylococcus epidermidis - MIC*    CIPROFLOXACIN 4 RESISTANT Resistant     ERYTHROMYCIN >=8 RESISTANT Resistant     GENTAMICIN <=0.5 SENSITIVE Sensitive     OXACILLIN >=4 RESISTANT Resistant     TETRACYCLINE 2 SENSITIVE Sensitive     VANCOMYCIN 2 SENSITIVE Sensitive     TRIMETH/SULFA 20 SENSITIVE Sensitive     CLINDAMYCIN <=0.25 SENSITIVE Sensitive     RIFAMPIN <=0.5 SENSITIVE Sensitive     Inducible Clindamycin NEGATIVE Sensitive     * STAPHYLOCOCCUS EPIDERMIDIS  Blood Culture ID Panel (Reflexed)     Status: Abnormal   Collection Time: 12/12/16  4:30 AM  Result Value Ref Range Status   Enterococcus species NOT DETECTED NOT DETECTED Final   Listeria monocytogenes NOT DETECTED NOT DETECTED Final   Staphylococcus species DETECTED (A) NOT DETECTED Final    Comment: Methicillin (oxacillin) resistant coagulase negative staphylococcus. Possible blood culture contaminant (unless isolated from more than one blood culture draw or clinical case suggests pathogenicity). No antibiotic treatment is indicated for blood  culture contaminants. CRITICAL RESULT CALLED TO, READ BACK BY AND VERIFIED WITH: Hughie Closs 12/13/16 @ 45 M VESTAL    Staphylococcus aureus NOT DETECTED NOT DETECTED Final   Methicillin resistance DETECTED (A) NOT DETECTED Final    Comment: CRITICAL RESULT CALLED TO, READ BACK BY AND VERIFIED WITH: Hughie Closs 12/13/16 @ 44 M VESTAL    Streptococcus species NOT DETECTED NOT DETECTED Final   Streptococcus agalactiae NOT DETECTED NOT DETECTED Final   Streptococcus pneumoniae NOT DETECTED NOT DETECTED Final   Streptococcus pyogenes NOT DETECTED NOT DETECTED Final   Acinetobacter baumannii NOT DETECTED NOT DETECTED Final   Enterobacteriaceae species NOT DETECTED NOT DETECTED Final   Enterobacter cloacae complex NOT  DETECTED NOT DETECTED Final   Escherichia coli NOT DETECTED NOT DETECTED Final   Klebsiella oxytoca NOT DETECTED NOT DETECTED Final   Klebsiella pneumoniae NOT DETECTED NOT DETECTED Final   Proteus species NOT DETECTED NOT DETECTED Final   Serratia marcescens NOT DETECTED NOT DETECTED Final   Haemophilus influenzae NOT DETECTED NOT DETECTED Final   Neisseria meningitidis NOT DETECTED NOT DETECTED Final   Pseudomonas aeruginosa NOT DETECTED NOT DETECTED Final   Candida albicans NOT DETECTED NOT DETECTED Final   Candida glabrata NOT DETECTED NOT DETECTED Final   Candida krusei NOT DETECTED NOT DETECTED Final   Candida parapsilosis NOT DETECTED NOT DETECTED Final   Candida tropicalis NOT DETECTED NOT DETECTED Final  Culture, blood (routine x 2)     Status: None (Preliminary result)   Collection Time: 12/14/16  9:45 AM  Result Value Ref Range Status   Specimen Description BLOOD RIGHT HAND  Final   Special Requests IN PEDIATRIC BOTTLE [place]  Final  Culture NO GROWTH < 24 HOURS  Final   Report Status PENDING  Incomplete  Culture, blood (routine x 2)     Status: None (Preliminary result)   Collection Time: 12/14/16 10:00 AM  Result Value Ref Range Status   Specimen Description BLOOD LEFT FINGER  Final   Special Requests IN PEDIATRIC BOTTLE 1CC  Final   Culture NO GROWTH < 24 HOURS  Final   Report Status PENDING  Incomplete    RADIOLOGY STUDIES/RESULTS: Ct Head Wo Contrast  Result Date: 12/10/2016 CLINICAL DATA:  Sepsis.  Unresponsive. EXAM: CT HEAD WITHOUT CONTRAST TECHNIQUE: Contiguous axial images were obtained from the base of the skull through the vertex without intravenous contrast. COMPARISON:  11/03/2016 FINDINGS: Brain: There is atrophy and chronic small vessel disease changes. 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 No acute intracranial abnormality. Specifically, no hemorrhage, hydrocephalus, mass lesion, acute infarction, or significant intracranial injury. Vascular: No  hyperdense vessel or unexpected calcification. Skull: No acute calvarial abnormality. Sinuses/Orbits: Visualized paranasal sinuses and mastoids clear. Orbital soft tissues unremarkable. Other: None IMPRESSION: No acute intracranial abnormality. Atrophy, chronic microvascular disease. Electronically Signed   By: Rolm Baptise M.D.   On: 12/10/2016 09:43   Ct Pelvis Wo Contrast  Result Date: 12/12/2016 CLINICAL DATA:  Sacral osteomyelitis. Sacral decubitus ulcer. History of melanoma. EXAM: CT PELVIS WITHOUT CONTRAST TECHNIQUE: Multidetector CT imaging of the pelvis was performed following the standard protocol without intravenous contrast. COMPARISON:  August 24, 2016 FINDINGS: Urinary Tract: The bladder is decompressed and thick walled, unchanged in the interval. The kidneys were not imaged. Bowel: There is a stool ball in the rectum with mild perirectal fat stranding. Remainder of the visualized bowel is normal. Vascular/Lymphatic: Mild atherosclerotic changes in non aneurysmal vessels. No adenopathy identified. Reproductive: Continued 4.6 cm mass in the left adnexum. No other changes. Other: Edema in the soft tissues, particularly the upper left thigh. Musculoskeletal: There is a decubitus ulcer overlying the sacrum. The defect reaches the death of the sacrum, particularly on the right. There is mild erosion of the posterior sacrum on the right as seen on series 3, image 67. No other definitive sites of erosion. There is a focus of gas in the soft tissues to the right of the sacrum on image 73 which may be extension directly from the ulcer. The chronic right hip fracture is identified and unchanged. Chronic fractures of the pelvic bones are unchanged. The previous acute intertrochanteric left hip fracture is again noted. The femur distal to the fracture line has migrated superiorly and now extends into the left iliopsoas muscle. Surrounding soft tissue calcification likely represents callus formation and  myositis ossificans. No other acute bony abnormalities. IMPRESSION: 1. Decubitus ulcer over the sacrum with minimal associated erosion seen posteriorly in a right sacral spine. This is consistent with at least mild osteomyelitis. An MRI could further assess. 2. Large stool ball in the rectum. Mild increased attenuation in the perirectal fat. The patient may be developing stercoral colitis. 3. Chronic right hip fracture. 4. Superior migration of the left femur, distal to a previously seen acute intertrochanteric fracture. The femur has migrated superiorly and now extends into the left iliopsoas muscle. Surrounding calcification is likely due to callus formation and myositis ossificans. 5. Continued 4.6 cm mass in the left adnexum. An ultrasound could further evaluate if not already performed. Electronically Signed   By: Dorise Bullion III M.D   On: 12/12/2016 19:46   US Renal  Result Date: 12/10/2016 CLINICAL DATA:  71 year old female with acute renal injury. EXAM: RENAL / URINARY TRACT ULTRASOUND COMPLETE COMPARISON:  Noncontrast CT Abdomen and Pelvis 08/24/2016. Renal ultrasound 08/23/2016 FINDINGS: Right Kidney: Length: 8.3 cm. Chronic increased renal echogenicity. Prominence of the left lower pole collecting system is stable since November with no overt hydronephrosis. No right renal mass identified. Left Kidney: Length: 10.2 cm. This kidney appears less echogenic than the right. No hydronephrosis or left renal mass. Bladder: Dependent echogenic debris (images 33, 40).  Diminutive bladder. IMPRESSION: 1. No acute renal findings. Right greater than left renal echogenicity raising the possibility of chronic medical renal disease. 2. Small volume of echogenic debris within the urinary bladder, correlate with urinalysis. Electronically Signed   By: Genevie Ann M.D.   On: 12/10/2016 11:31   Dg Chest Port 1 View  Result Date: 12/10/2016 CLINICAL DATA:  Shortness of Breath EXAM: PORTABLE CHEST 1 VIEW COMPARISON:   07/31/2016 FINDINGS: Cardiac shadow is within normal limits. Calcified granulomas are noted on the right. The lungs are well aerated bilaterally. No focal infiltrate or sizable effusion is seen. No acute bony abnormality is noted. Old rib fractures are again seen on the left. IMPRESSION: Prior granulomatous disease.  No acute abnormality noted. Electronically Signed   By: Inez Catalina M.D.   On: 12/10/2016 07:47     LOS: 5 days   Oren Binet, MD  [facility] Pager:[phone]  If 7PM-7AM, please contact night-coverage [email or web address] Password [facility] 12/15/2016, 12:50 PM

## 2016-12-15 NOTE — Progress Notes (Signed)
No charge note:   Spoke with [name], patient's son this morning regarding long term antibiotics, likely poor outcomes, options of transitioning to comfort care and residential Hospice placement. He agrees this is best option for his mother. Currently trying to get in touch with [name] patient's HCPOA before officially proceeding with comfort measures.   Mariana Kaufman, AGNP-C Palliative Medicine  Please call Palliative Medicine team phone with any questions [phone]. For individual providers please see AMION.

## 2016-12-15 NOTE — Progress Notes (Signed)
Report was called to [place] at [phone]. Paper copy of prescriptions with be sent with discharge summary along with a yellow DNR form. The patient will be transporting via [place]. I will continue to monitor the patient closely.   Saddie Benders RN

## 2016-12-15 NOTE — Progress Notes (Signed)
Location:    [facility]    Place of Service:  SNF (31)    Allergies  Allergen Reactions  . Codeine Phosphate Nausea Only    Chief Complaint  Patient presents with  . Acute Visit    patient status     HPI:  Staff is concerned about her left lower extremity. She does have swelling present with redness; and heat present. She is unable to participate in the hpi or ros; but did have pain in her left calf.    Past Medical History:  Diagnosis Date  . Adnexal mass   . Alcohol abuse 02/27/2012  . ALLERGIC RHINITIS 06/23/2007  . ANXIETY 06/23/2007  . ANXIETY DEPRESSION 06/12/2009  . Cancer ([place])    skin cx/ hx melanoma  . CKD (chronic kidney disease) stage 2, GFR 60-89 ml/min   . COLONIC POLYPS, HX OF 03/08/2002  . 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 Dementia   . DIVERTICULOSIS, COLON 08/04/2007  . GERD 06/23/2007  . HYPERLIPIDEMIA 06/24/2007  . HYPERTENSION 06/23/2007  . Irritable bowel syndrome 06/23/2007  . LOW BACK PAIN 06/24/2007  . MELANOMA, HX OF 08/04/2007  . Memory dysfunction 06/23/2011    Past Surgical History:  Procedure Laterality Date  . APPENDECTOMY    . CESAREAN SECTION     x 2  . COLONOSCOPY      Social History   Social History  . Marital status: Married    Spouse name: [name]  . Number of children: [name]  . Years of education: [name]   Occupational History  . Not on file.   Social History Main Topics  . Smoking status: Never Smoker  . Smokeless tobacco: Never Used  . Alcohol use 25.2 oz/week    42 Glasses of wine per week     Comment: 11/2016 " i Do not drink everyday"  . Drug use: No  . Sexual activity: No   Other Topics Concern  . Not on file   Social History Narrative  . No narrative on file   Family History  Problem Relation Age of Onset  . Colon cancer Cousin   . Hypertension Other   . Diabetes Other       VITAL SIGNS BP (!) 100/55   Pulse 84   Temp 99.6 F (37.6 C)   Resp 16   Ht 5\' 5"  (1.651 m)   Wt 108 lb  3.2 oz (49.1 kg)   SpO2 92%   BMI 18.01 kg/m     Patient's Medications  New Prescriptions   No medications on file  Previous Medications   ACETAMINOPHEN (TYLENOL) 500 MG TABLET    Take 500 mg by mouth every 4 (four) hours as needed for mild pain.   ALUM & MAG HYDROXIDE-SIMETH (MINTOX) 782-956-21 MG/5ML SUSPENSION    Take 30 mLs by mouth every 6 (six) hours as needed for indigestion or heartburn.   DIVALPROEX (DEPAKOTE SPRINKLE) 125 MG CAPSULE    Take 250 mg by mouth 3 (three) times daily.    ESCITALOPRAM (LEXAPRO) 10 MG TABLET    Take 10 mg by mouth daily.   FERROUS SULFATE 325 (65 FE) MG TABLET    Take 1 tablet (325 mg total) by mouth 3 (three) times daily after meals.   FOLIC ACID (FOLVITE) 1 MG TABLET    Take 1 tablet (1 mg total) by mouth daily.   GUAIFENESIN (ROBITUSSIN) 100 MG/5ML SYRUP    Take 200 mg by mouth 3 (three) times daily as  needed for cough.   LANOLIN OINTMENT    Apply 1 application topically as needed for dry skin.   LORAZEPAM (ATIVAN) 0.5 MG TABLET    Take 1 tablet (0.5 mg total) by mouth every 8 (eight) hours as needed for anxiety.   MAGNESIUM HYDROXIDE (MILK OF MAGNESIA) 400 MG/5ML SUSPENSION    Take 30 mLs by mouth daily as needed for mild constipation.   MEMANTINE HCL-DONEPEZIL HCL (NAMZARIC) 28-10 MG CP24    Take 1 capsule by mouth every morning.   MULTIPLE VITAMIN (MULTIVITAMIN WITH MINERALS) TABS TABLET    Take 1 tablet by mouth daily.   PROBIOTIC PRODUCT (PROBIOTIC DAILY) CAPS    Take 1 capsule by mouth 2 (two) times daily.   QUETIAPINE (SEROQUEL) 25 MG TABLET    Take 0.5 tablets (12.5 mg total) by mouth 2 (two) times daily.   THIAMINE 100 MG TABLET    Take 1 tablet (100 mg total) by mouth daily.   TRAMADOL (ULTRAM) 50 MG TABLET    Take one tablet by mouth every 8 hours for pain  Modified Medications   No medications on file  Discontinued Medications   No medications on file     SIGNIFICANT DIAGNOSTIC EXAMS   07-31-16: 1.  No acute intracranial  abnormality. 2.  Cerebral atrophy and small vessel ischemic change  08-03-16: ct of abdomen and pelvis: 1. Diffuse bladder wall thickening which may reflect cystitis. 2. Moderate to severe right and moderate left hydroureteronephrosis. No obstructing calculi. This may be secondary to #1. 3. Mild T12 and L2 compression fractures, new from 2014 though of indeterminate acuity. 4. **An incidental finding of potential clinical significance has been found. 4.6 cm left adnexal mass. Nonurgent evaluation with pelvic ultrasound is recommended.** 5. Aortic atherosclerosis.  08-21-16: left hip x-ray: Acute displaced LEFT femur intertrochanteric fracture without dislocation. Old nonunited RIGHT femoral neck fracture. Old bilateral pelvic Fractures.  08-22-16: renal ultrasound: Limited exam. Moderate bilateral hydronephrosis. Nonspecific circumferential wall thickening of the urinary bladder.  08-23-16: renal ultrasound: Significant decrease in hydronephrosis since previous study with mild residual hydronephrosis on the right and no significant hydronephrosis on the left. Bladder is decompressed with a Foley catheter  08-24-16: ct of abdomen and pelvis: IMPRESSION: 1. No hematoma is identified. 2. Trace bilateral pleural effusions. 3. Gallbladder sludge or small stones. No evidence for cholecystitis. 4. Mild persistent hydronephrosis stable from prior renal ultrasound given differences in technique. 5. Chronic right and acute left proximal femoral fractures. Fat stranding and mild swelling of left upper thigh muscles is probably reactive edema. No discrete hematoma identified. 6. Stable T12 and L2 mild compression deformities. 7. **An incidental finding of potential clinical significance has been found. Left adnexal mass measuring up to 4.6 cm, further characterization with ultrasound is recommended. T    LABS REVIEWED:   08-10-16: wbc 6.1; hgb 11.0; hct 34.0; mcv 97.1; plt 163; glucose 78; bun 21.9;  creat 1.22; k+ 3.5; na++ 142  08-12-16: wbc 8.6; hgb 11.3; hct 34.9; mcv 97.7; plt 166; glucose 73; bun 30.5; creat 1.35; k+ 3.4 na++ 143;  08-21-16; wbc 16.0; hgb 10.7; hct 33.6; mcv 97.1; plt 144; glucose 125; bun 90; bun 2.96; k+ 3.4; na++ 147; urine culture: yeast: diflucan  08-24-16: wbc 6.4; hgb 7.7; hct 24.7;mcv 102.1; plt 130; glucose 303; bun 46; creat 1.43; k+ 4.0; na++ 144; vit B 12: 458; folate 33.6; iron 49; TIBC 140; ferritin 450  08-26-16: wbc 10.0; hgb 8.9; hct 28.0; mcv 98.9; plt 177; glucose  111; bun 27; creat 1.08; k+ 3.6; na++ 140  08-31-16: wbc 8.6; hgb 9.4; hct 28.3; mcv 98.4; plt 260; glucose 141; bun 16.0; creat 0.94; k+ 3.9; na++ 140     Review of Systems  Unable to perform ROS: Dementia    Physical Exam  Constitutional: No distress.  Frail   Eyes: Conjunctivae are normal.  Neck: Neck supple. No JVD present. No thyromegaly present.  Cardiovascular: Normal rate, regular rhythm and intact distal pulses.   Respiratory: Effort normal and breath sounds normal. No respiratory distress. She has no wheezes.  GI: Soft. Bowel sounds are normal. She exhibits no distension. There is no tenderness.  Musculoskeletal: has left lower extremity edema .  Did not voluntarily more lower extremities   Lymphadenopathy:    She has no cervical adenopathy.  Neurological: She is alert.  Skin: Skin is warm and dry. She is not diaphoretic.  Left calf red hot hard to touch  Psychiatric: She has a normal mood and affect.      ASSESSMENT/ PLAN:  1. Hypertension 2. Left lower extremity cellulitis  Will stop norvasc Will stop keflex Will begin doxycycline 100 mg twice daily for 10 day Will get left lower extremity doppler to look at dvt      MD is aware of resident's narcotic use and is in agreement with current plan of care. We will attempt to wean resident as apropriate   Ok Edwards NP [facility]  Contact [phone] Monday through Friday 8am- 5pm  After  hours call [phone]

## 2016-12-15 NOTE — Progress Notes (Signed)
ANTICOAGULATION CONSULT NOTE - Follow Up Consult  Pharmacy Consult for Heparin bridge while Xarelto on hold Indication: DVT  Allergies  Allergen Reactions  . Codeine Phosphate Nausea Only    Patient Measurements: Height: 5\' 7"  (170.2 cm) Weight: 98 lb (44.5 kg) IBW/kg (Calculated) : 61.6 Heparin Dosing Weight: 44  Vital Signs: Temp: 96.7 F (35.9 C) (03/21 0805) Temp Source: Axillary (03/21 0805) BP: 105/54 (03/21 0442) Pulse Rate: 56 (03/21 0805)  Labs:  Recent Labs  12/13/16 0315 12/14/16 0423 12/15/16 0611  HGB 8.7* 8.8* 8.2*  HCT 29.5* 29.4* 27.1*  PLT 110* 104* 91*  HEPARINUNFRC 0.40 0.35 0.39  CREATININE 2.54* 2.15* 1.82*    Estimated Creatinine Clearance: 20.5 mL/min (A) (by C-G formula based on SCr of 1.82 mg/dL (H)).   Medications:  Scheduled:  . cefTAZidime (FORTAZ)  IV  1 g Intravenous Q24H  . chlorhexidine  15 mL Mouth Rinse BID  . Chlorhexidine Gluconate Cloth  6 each Topical Q0600  . mouth rinse  15 mL Mouth Rinse q12n4p  . mupirocin ointment  1 application Nasal BID  . potassium chloride (KCL MULTIRUN) 30 mEq in 265 mL IVPB  30 mEq Intravenous Once  . sodium chloride flush  3 mL Intravenous Q12H  . valproate sodium  250 mg Intravenous Q8H  . vancomycin  500 mg Intravenous Q48H    Assessment: 71 yr old female continues on IV heparin for hx DVT 10/21/16.  Heparin level therapeutic on current rate.  Hg and pltc with downward trend, baseline values reflective of hemoconcentration on admission.  No bleeding noted.  Goal of Therapy:  Heparin level 0.3-0.7 units/ml Monitor platelets by anticoagulation protocol: Yes   Plan:  Continue Heparin 700 units/hr Watch for s/s of bleeding and f/u AM labs  Gracy Bruins, PharmD Clinical Pharmacist [facility]

## 2016-12-15 NOTE — Progress Notes (Signed)
Pharmacy Antibiotic Note  Tara Shaffer is a 71 y.o. female admitted on 12/10/2016 with bacteremia, UTI and stage 4 decubitis ulcer.  Pharmacy has been consulted for Vancomycin and Ceftazidime dosing.  Cr continues to correct, now < 2.  WBC wnl. AFeb, but also with Temp < 97  Plan: Change Vancomycin to 500mg  IV q24 due to improved renal function Continue Ceftazidime 1g IV q24 (for pseudomonas UTI) Monitor renal function and clinical status Check Vanc trough at steady state  Height: 5\' 7"  (170.2 cm) Weight: 98 lb (44.5 kg) IBW/kg (Calculated) : 61.6  Temp (24hrs), Avg:96.5 F (35.8 C), Min:96.3 F (35.7 C), Max:96.7 F (35.9 C)   Recent Labs Lab 12/10/16 1018 12/10/16 1332  12/11/16 0255  12/12/16 0428 12/12/16 0925 12/12/16 1531 12/12/16 1532 12/13/16 0315 12/14/16 0423 12/15/16 0611  WBC  --   --   --  14.8*  --  14.2*  --   --   --  10.5 9.1 7.2  CREATININE  --   --   < > 4.12*  < > 3.21*  --   --  2.83* 2.54* 2.15* 1.82*  LATICACIDVEN 1.53 1.7  --   --   --  2.9* 3.2* 3.3*  --   --   --   --   < > = values in this interval not displayed.  Estimated Creatinine Clearance: 20.5 mL/min (A) (by C-G formula based on SCr of 1.82 mg/dL (H)).    Allergies  Allergen Reactions  . Codeine Phosphate Nausea Only    Antimicrobials this admission:  Vancomycin 3/16 >> Zosyn 3/16>>3/18 Ceftaz 3/18>> CHG/Bactroban 3/19>>(3/23), though PCR was negative  Dose adjustments this admission:  3/21  Vanc 500mg  q48 -> q24 (improved renal fxn)  Microbiology results:  3/16 BCx: 1/2 Staph epi, 2/2 Staph hominis, MIC to Vanc = 1 3/16 UCx: >100K Pseudomonas (sens Cefepime, Ceftazidime, Cipro, Imi, Zosyn, Gent MIC <1) 3/16 MRSA PCR: neg 3/18 blood - #1/2 Staph epi 3/18 BICD - Staph sp, MecA (-) 3/20 blood x 2 - ntd  Thank you for allowing pharmacy to be a part of this patient's care.  Gracy Bruins, PharmD Clinical Pharmacist [facility]

## 2016-12-15 NOTE — Progress Notes (Signed)
Patient ID: Tara Shaffer, female   DOB: 1946-02-02, 71 y.o.   MRN: [id number]          [facility]    Date of Admission:  12/10/2016     I note that [name] has been transitioned to comfort care only and antibiotics have been stopped. I am in agreement with that plan. We will sign off now.         Michel Bickers, MD [facility] [phone] pager   [phone] cell 12/15/2016, 10:00 AM

## 2016-12-15 NOTE — Discharge Summary (Signed)
PATIENT DETAILS Name: Tara Shaffer Age: 71 y.o. Sex: female Date of Birth: 09-21-1946 MRN: [id number]. Admitting Physician: Elwin Mocha, MD IRW:[id number] Jenny Reichmann, MD  Admit Date: 12/10/2016 Discharge date: 12/15/2016  Recommendations for Outpatient Follow-up:  1. Being discharged with full comfort measures  2. Please and show follow-up with hospice post discharge  3. Optimize comfort measures   Admitted From:  SNF  Disposition: SNF with hospice follow-up-being discharged with full comfort measures.  Home Health: No  Equipment/Devices: None  Discharge Condition: Hospice  CODE STATUS: DNR/ Comfort Care  Diet recommendation:  Dysphagia 2 with full aspiration precautions  Brief Summary: See H&P, Labs, Consult and Test reports for all details in brief, Patient is a 71 y.o. female with past medical history of dementia-bedridden-ST of sacral decubitus ulcer my recent DVT, admitted for sepsis, further evaluation revealed coag-negative staph bacteremia and underlying sacral osteomyelitis.Initially on broad-spectrum antibiotics, however given her overall poor prognosis, palliative care was consulted-she has not been transitioned to full comfort measures.   Brief Hospital Course: Sepsis secondary to coagulase-negative bacteremia, and sacral osteomyelitis: Initially on broad-spectrum antibiotics, however after discussion with palliative care, family has not decided to transition to full comfort measures. Subsequently all antibiotics have been discontinued. Patient has very poor prognoses-she is bedbound-with sacral decubitus ulcers-now with coagulase-negative Staphylococcus bacteremia. Her prognosis is very poor. Initially we contemplated discharged to residential hospice, however per social work, family now wants her to go back to SNF with hospice follow-up there. Please continue to optimize comfort care while at SNF.  Hypernatremia: Clearly secondary to dehydration,  electrolytes better after hypotonic fluid administration. Since transitioned to comfort measures, no further lab monitoring required.  Acute kidney injury: Likely prerenal azotemia-as improving with IV fluids. Since admission to full comfort measures, doubt any further lab monitoring required.  Dementia with behavioral disturbance: Supportive care-continue as needed Haldol.  Recent DVT in January 2018: Initially maintained on IV heparin-since transitioned to comfort measures-no longer on anticoagulation.  Stage IV sacral ulcer: Present prior to hospitalization-CT scan showed possible sacral osteomyelitis. Supportive care only-now on comfort measures.  Dysphagia: Seen by speech therapy-started on dysphagia 2 diet-patient now transitioned to full comfort measures.  Procedures/Studies: None  Discharge Diagnoses:  Principal Problem:   Sacral osteomyelitis ([place]) Active Problems:   HLD (hyperlipidemia)   Essential hypertension   GERD   Dementia with behavioral disturbance   DVT, femoral, chronic (HCC)   Anemia of chronic disease   ARF (acute renal failure) (HCC)   Protein-calorie malnutrition, severe   Intertrochanteric fracture of left hip (HCC)   Stage IV pressure ulcer of sacral region (HCC)   Hypernatremia   Adnexal mass   Coag negative Staphylococcus bacteremia   Dementia without behavioral disturbance   Discharge Instructions:  Activity:  As tolerated    Discharge Instructions    Diet general    Complete by:  As directed    Dysphagia 2 diet   Increase activity slowly    Complete by:  As directed      Allergies as of 12/15/2016      Reactions   Codeine Phosphate Nausea Only      Medication List    STOP taking these medications   acetaminophen 500 MG tablet Commonly known as:  TYLENOL   divalproex 125 MG capsule Commonly known as:  DEPAKOTE SPRINKLE   escitalopram 10 MG tablet Commonly known as:  LEXAPRO   ferrous sulfate 325 (65 FE) MG tablet     folic acid 1  MG tablet Commonly known as:  FOLVITE   guaifenesin 100 MG/5ML syrup Commonly known as:  ROBITUSSIN   lanolin ointment   magnesium hydroxide 400 MG/5ML suspension Commonly known as:  MILK OF MAGNESIA   MINTOX 200-200-20 MG/5ML suspension Generic drug:  alum & mag hydroxide-simeth   multivitamin with minerals Tabs tablet   NAMZARIC 28-10 MG Cp24 Generic drug:  Memantine HCl-Donepezil HCl   QUEtiapine 25 MG tablet Commonly known as:  SEROQUEL   rivaroxaban 20 MG Tabs tablet Commonly known as:  XARELTO   thiamine 100 MG tablet   traMADol 50 MG tablet Commonly known as:  ULTRAM     TAKE these medications   glycopyrrolate 1 MG tablet Commonly known as:  ROBINUL Take 1 tablet (1 mg total) by mouth every 4 (four) hours as needed (excessive secretions).   haloperidol 2 MG/ML solution Commonly known as:  HALDOL Place 0.3 mLs (0.6 mg total) under the tongue every 4 (four) hours as needed for agitation (or delirium).   LORazepam 1 MG tablet Commonly known as:  ATIVAN Place 1 tablet (1 mg total) under the tongue every 4 (four) hours as needed for anxiety or sleep. What changed:  medication strength  how much to take  how to take this  when to take this  reasons to take this   oxyCODONE 5 MG/5ML solution Commonly known as:  ROXICODONE Take 5 mLs (5 mg total) by mouth every 2 (two) hours as needed for moderate pain (OR SOB OR ANXIETY).   polyvinyl alcohol 1.4 % ophthalmic solution Commonly known as:  LIQUIFILM TEARS Place 1 drop into both eyes 4 (four) times daily as needed for dry eyes.      Follow-up Information    [name], MD. Schedule an appointment as soon as possible for a visit in 1 week(s).   Specialties:  Internal Medicine, Radiology Why:  As needed Contact information: [address] [place] [address] [phone]          Allergies  Allergen Reactions  . Codeine Phosphate Nausea Only    Consultations:   ID,  general surgery and Palliative Care  Other Procedures/Studies: Ct Head Wo Contrast  Result Date: 12/10/2016 CLINICAL DATA:  Sepsis.  Unresponsive. EXAM: CT HEAD WITHOUT CONTRAST TECHNIQUE: Contiguous axial images were obtained from the base of the skull through the vertex without intravenous contrast. COMPARISON:  11/03/2016 FINDINGS: Brain: There is atrophy and chronic small vessel disease changes. 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 No acute intracranial abnormality. Specifically, no hemorrhage, hydrocephalus, mass lesion, acute infarction, or significant intracranial injury. Vascular: No hyperdense vessel or unexpected calcification. Skull: No acute calvarial abnormality. Sinuses/Orbits: Visualized paranasal sinuses and mastoids clear. Orbital soft tissues unremarkable. Other: None IMPRESSION: No acute intracranial abnormality. Atrophy, chronic microvascular disease. Electronically Signed   By: Rolm Baptise M.D.   On: 12/10/2016 09:43   Ct Pelvis Wo Contrast  Result Date: 12/12/2016 CLINICAL DATA:  Sacral osteomyelitis. Sacral decubitus ulcer. History of melanoma. EXAM: CT PELVIS WITHOUT CONTRAST TECHNIQUE: Multidetector CT imaging of the pelvis was performed following the standard protocol without intravenous contrast. COMPARISON:  August 24, 2016 FINDINGS: Urinary Tract: The bladder is decompressed and thick walled, unchanged in the interval. The kidneys were not imaged. Bowel: There is a stool ball in the rectum with mild perirectal fat stranding. Remainder of the visualized bowel is normal. Vascular/Lymphatic: Mild atherosclerotic changes in non aneurysmal vessels. No adenopathy identified. Reproductive: Continued 4.6 cm mass in the left  adnexum. No other changes. Other: Edema in the soft tissues, particularly the upper left thigh. Musculoskeletal: There is a decubitus ulcer overlying the sacrum. The defect reaches the death of the sacrum, particularly on the right. There is mild erosion  of the posterior sacrum on the right as seen on series 3, image 67. No other definitive sites of erosion. There is a focus of gas in the soft tissues to the right of the sacrum on image 73 which may be extension directly from the ulcer. The chronic right hip fracture is identified and unchanged. Chronic fractures of the pelvic bones are unchanged. The previous acute intertrochanteric left hip fracture is again noted. The femur distal to the fracture line has migrated superiorly and now extends into the left iliopsoas muscle. Surrounding soft tissue calcification likely represents callus formation and myositis ossificans. No other acute bony abnormalities. IMPRESSION: 1. Decubitus ulcer over the sacrum with minimal associated erosion seen posteriorly in a right sacral spine. This is consistent with at least mild osteomyelitis. An MRI could further assess. 2. Large stool ball in the rectum. Mild increased attenuation in the perirectal fat. The patient may be developing stercoral colitis. 3. Chronic right hip fracture. 4. Superior migration of the left femur, distal to a previously seen acute intertrochanteric fracture. The femur has migrated superiorly and now extends into the left iliopsoas muscle. Surrounding calcification is likely due to callus formation and myositis ossificans. 5. Continued 4.6 cm mass in the left adnexum. An ultrasound could further evaluate if not already performed. Electronically Signed   By: Dorise Bullion III M.D   On: 12/12/2016 19:46   US Renal  Result Date: 12/10/2016 CLINICAL DATA:  71 year old female with acute renal injury. EXAM: RENAL / URINARY TRACT ULTRASOUND COMPLETE COMPARISON:  Noncontrast CT Abdomen and Pelvis 08/24/2016. Renal ultrasound 08/23/2016 FINDINGS: Right Kidney: Length: 8.3 cm. Chronic increased renal echogenicity. Prominence of the left lower pole collecting system is stable since November with no overt hydronephrosis. No right renal mass identified. Left  Kidney: Length: 10.2 cm. This kidney appears less echogenic than the right. No hydronephrosis or left renal mass. Bladder: Dependent echogenic debris (images 33, 40).  Diminutive bladder. IMPRESSION: 1. No acute renal findings. Right greater than left renal echogenicity raising the possibility of chronic medical renal disease. 2. Small volume of echogenic debris within the urinary bladder, correlate with urinalysis. Electronically Signed   By: Genevie Ann M.D.   On: 12/10/2016 11:31   Dg Chest Port 1 View  Result Date: 12/10/2016 CLINICAL DATA:  Shortness of Breath EXAM: PORTABLE CHEST 1 VIEW COMPARISON:  07/31/2016 FINDINGS: Cardiac shadow is within normal limits. Calcified granulomas are noted on the right. The lungs are well aerated bilaterally. No focal infiltrate or sizable effusion is seen. No acute bony abnormality is noted. Old rib fractures are again seen on the left. IMPRESSION: Prior granulomatous disease.  No acute abnormality noted. Electronically Signed   By: Inez Catalina M.D.   On: 12/10/2016 07:47      TODAY-DAY OF DISCHARGE:  Subjective:   Asencion Islam today remains lethargic-somewhat alert but still very confused.  Objective:   Blood pressure 123/65, pulse 66, temperature 97.3 F (36.3 C), temperature source Oral, resp. rate 20, height 5\' 7"  (1.702 m), weight 44.5 kg (98 lb), SpO2 100 %.  Intake/Output Summary (Last 24 hours) at 12/15/16 1602 Last data filed at 12/15/16 1000  Gross per 24 hour  Intake          1993.58 ml  Output                0 ml  Net          1993.58 ml   Filed Weights   12/11/16 0441 12/14/16 0444 12/15/16 0442  Weight: 44 kg (97 lb) 41.3 kg (91 lb) 44.5 kg (98 lb)    Exam: AwakeAnd somewhat alert.  No new F.N deficits, Normal affect [place].AT,PERRAL Supple Neck,No JVD  Symmetrical Chest wall movement,  CTAB RRR,No Gallops,Rubs or new Murmurs +ve B.Sounds, Abd Soft, Non tender, No organomegaly appriciated, No rebound -guarding or rigidity. No  Cyanosis, Clubbing or edema, No new Rash or bruise PERTINENT RADIOLOGIC STUDIES: Ct Head Wo Contrast  Result Date: 12/10/2016 CLINICAL DATA:  Sepsis.  Unresponsive. EXAM: CT HEAD WITHOUT CONTRAST TECHNIQUE: Contiguous axial images were obtained from the base of the skull through the vertex without intravenous contrast. COMPARISON:  11/03/2016 FINDINGS: Brain: There is atrophy and chronic small vessel disease changes. 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 No acute intracranial abnormality. Specifically, no hemorrhage, hydrocephalus, mass lesion, acute infarction, or significant intracranial injury. Vascular: No hyperdense vessel or unexpected calcification. Skull: No acute calvarial abnormality. Sinuses/Orbits: Visualized paranasal sinuses and mastoids clear. Orbital soft tissues unremarkable. Other: None IMPRESSION: No acute intracranial abnormality. Atrophy, chronic microvascular disease. Electronically Signed   By: Rolm Baptise M.D.   On: 12/10/2016 09:43   Ct Pelvis Wo Contrast  Result Date: 12/12/2016 CLINICAL DATA:  Sacral osteomyelitis. Sacral decubitus ulcer. History of melanoma. EXAM: CT PELVIS WITHOUT CONTRAST TECHNIQUE: Multidetector CT imaging of the pelvis was performed following the standard protocol without intravenous contrast. COMPARISON:  August 24, 2016 FINDINGS: Urinary Tract: The bladder is decompressed and thick walled, unchanged in the interval. The kidneys were not imaged. Bowel: There is a stool ball in the rectum with mild perirectal fat stranding. Remainder of the visualized bowel is normal. Vascular/Lymphatic: Mild atherosclerotic changes in non aneurysmal vessels. No adenopathy identified. Reproductive: Continued 4.6 cm mass in the left adnexum. No other changes. Other: Edema in the soft tissues, particularly the upper left thigh. Musculoskeletal: There is a decubitus ulcer overlying the sacrum. The defect reaches the death of the sacrum, particularly on the right. There  is mild erosion of the posterior sacrum on the right as seen on series 3, image 67. No other definitive sites of erosion. There is a focus of gas in the soft tissues to the right of the sacrum on image 73 which may be extension directly from the ulcer. The chronic right hip fracture is identified and unchanged. Chronic fractures of the pelvic bones are unchanged. The previous acute intertrochanteric left hip fracture is again noted. The femur distal to the fracture line has migrated superiorly and now extends into the left iliopsoas muscle. Surrounding soft tissue calcification likely represents callus formation and myositis ossificans. No other acute bony abnormalities. IMPRESSION: 1. Decubitus ulcer over the sacrum with minimal associated erosion seen posteriorly in a right sacral spine. This is consistent with at least mild osteomyelitis. An MRI could further assess. 2. Large stool ball in the rectum. Mild increased attenuation in the perirectal fat. The patient may be developing stercoral colitis. 3. Chronic right hip fracture. 4. Superior migration of the left femur, distal to a previously seen acute intertrochanteric fracture. The femur has migrated superiorly and now extends into the left iliopsoas muscle. Surrounding calcification is likely due to callus formation and myositis ossificans. 5. Continued 4.6 cm mass in the left adnexum. An ultrasound could  further evaluate if not already performed. Electronically Signed   By: Dorise Bullion III M.D   On: 12/12/2016 19:46   US Renal  Result Date: 12/10/2016 CLINICAL DATA:  71 year old female with acute renal injury. EXAM: RENAL / URINARY TRACT ULTRASOUND COMPLETE COMPARISON:  Noncontrast CT Abdomen and Pelvis 08/24/2016. Renal ultrasound 08/23/2016 FINDINGS: Right Kidney: Length: 8.3 cm. Chronic increased renal echogenicity. Prominence of the left lower pole collecting system is stable since November with no overt hydronephrosis. No right renal mass  identified. Left Kidney: Length: 10.2 cm. This kidney appears less echogenic than the right. No hydronephrosis or left renal mass. Bladder: Dependent echogenic debris (images 33, 40).  Diminutive bladder. IMPRESSION: 1. No acute renal findings. Right greater than left renal echogenicity raising the possibility of chronic medical renal disease. 2. Small volume of echogenic debris within the urinary bladder, correlate with urinalysis. Electronically Signed   By: Genevie Ann M.D.   On: 12/10/2016 11:31   Dg Chest Port 1 View  Result Date: 12/10/2016 CLINICAL DATA:  Shortness of Breath EXAM: PORTABLE CHEST 1 VIEW COMPARISON:  07/31/2016 FINDINGS: Cardiac shadow is within normal limits. Calcified granulomas are noted on the right. The lungs are well aerated bilaterally. No focal infiltrate or sizable effusion is seen. No acute bony abnormality is noted. Old rib fractures are again seen on the left. IMPRESSION: Prior granulomatous disease.  No acute abnormality noted. Electronically Signed   By: Inez Catalina M.D.   On: 12/10/2016 07:47     PERTINENT LAB RESULTS: CBC:  Recent Labs  12/14/16 0423 12/15/16 0611  WBC 9.1 7.2  HGB 8.8* 8.2*  HCT 29.4* 27.1*  PLT 104* 91*   CMET CMP     Component Value Date/Time   NA 144 12/15/2016 0611   NA 140 08/31/2016 1102   K 2.5 (LL) 12/15/2016 0611   CL 118 (H) 12/15/2016 0611   CO2 15 (L) 12/15/2016 0611   GLUCOSE 91 12/15/2016 0611   GLUCOSE 106 (H) 07/14/2006 0803   BUN 72 (H) 12/15/2016 0611   BUN 16 08/31/2016 1102   CREATININE 1.82 (H) 12/15/2016 0611   CALCIUM 7.5 (L) 12/15/2016 0611   PROT 5.7 (L) 12/12/2016 0428   ALBUMIN 1.9 (L) 12/12/2016 0428   AST 26 12/12/2016 0428   ALT 16 12/12/2016 0428   ALKPHOS 66 12/12/2016 0428   BILITOT 0.5 12/12/2016 0428   GFRNONAA 27 (L) 12/15/2016 0611   GFRAA 32 (L) 12/15/2016 0611    GFR Estimated Creatinine Clearance: 20.5 mL/min (A) (by C-G formula based on SCr of 1.82 mg/dL (H)). No results for  input(s): LIPASE, AMYLASE in the last 72 hours. No results for input(s): CKTOTAL, CKMB, CKMBINDEX, TROPONINI in the last 72 hours. Invalid input(s): POCBNP No results for input(s): DDIMER in the last 72 hours. No results for input(s): HGBA1C in the last 72 hours. No results for input(s): CHOL, HDL, LDLCALC, TRIG, CHOLHDL, LDLDIRECT in the last 72 hours. No results for input(s): TSH, T4TOTAL, T3FREE, THYROIDAB in the last 72 hours.  Invalid input(s): FREET3 No results for input(s): VITAMINB12, FOLATE, FERRITIN, TIBC, IRON, RETICCTPCT in the last 72 hours. Coags: No results for input(s): INR in the last 72 hours.  Invalid input(s): PT Microbiology: Recent Results (from the past 240 hour(s))  Culture, blood (Routine x 2)     Status: Abnormal   Collection Time: 12/10/16  7:30 AM  Result Value Ref Range Status   Specimen Description BLOOD RIGHT FOREARM  Final   Special Requests  BOTTLES DRAWN AEROBIC ONLY  5CC  Final   Culture  Setup Time   Final    GRAM POSITIVE COCCI IN CLUSTERS AEROBIC BOTTLE ONLY CRITICAL RESULT CALLED TO, READ BACK BY AND VERIFIED WITH: G ABBOTT, PHARMD @0728  12/11/16 MKELLY,MLT    Culture STAPHYLOCOCCUS HOMINIS (A)  Final   Report Status 12/13/2016 FINAL  Final   Organism ID, Bacteria STAPHYLOCOCCUS HOMINIS  Final      Susceptibility   Staphylococcus hominis - MIC*    CIPROFLOXACIN >=8 RESISTANT Resistant     ERYTHROMYCIN >=8 RESISTANT Resistant     GENTAMICIN <=0.5 SENSITIVE Sensitive     OXACILLIN >=4 RESISTANT Resistant     TETRACYCLINE >=16 RESISTANT Resistant     VANCOMYCIN 1 SENSITIVE Sensitive     TRIMETH/SULFA 80 RESISTANT Resistant     CLINDAMYCIN >=8 RESISTANT Resistant     RIFAMPIN <=0.5 SENSITIVE Sensitive     Inducible Clindamycin NEGATIVE Sensitive     * STAPHYLOCOCCUS HOMINIS  Blood Culture ID Panel (Reflexed)     Status: Abnormal   Collection Time: 12/10/16  7:30 AM  Result Value Ref Range Status   Enterococcus species NOT DETECTED NOT  DETECTED Final   Listeria monocytogenes NOT DETECTED NOT DETECTED Final   Staphylococcus species DETECTED (A) NOT DETECTED Final    Comment: Methicillin (oxacillin) resistant coagulase negative staphylococcus. Possible blood culture contaminant (unless isolated from more than one blood culture draw or clinical case suggests pathogenicity). No antibiotic treatment is indicated for blood  culture contaminants. CRITICAL RESULT CALLED TO, READ BACK BY AND VERIFIED WITH: G ABBOTT, PHARMD @0728  12/11/16 MKELLY,MLT    Staphylococcus aureus NOT DETECTED NOT DETECTED Final   Methicillin resistance DETECTED (A) NOT DETECTED Final    Comment: CRITICAL RESULT CALLED TO, READ BACK BY AND VERIFIED WITH: G ABBOTT,PHARMD @0728  12/11/16 MKELLY,MLT    Streptococcus species NOT DETECTED NOT DETECTED Final   Streptococcus agalactiae NOT DETECTED NOT DETECTED Final   Streptococcus pneumoniae NOT DETECTED NOT DETECTED Final   Streptococcus pyogenes NOT DETECTED NOT DETECTED Final   Acinetobacter baumannii NOT DETECTED NOT DETECTED Final   Enterobacteriaceae species NOT DETECTED NOT DETECTED Final   Enterobacter cloacae complex NOT DETECTED NOT DETECTED Final   Escherichia coli NOT DETECTED NOT DETECTED Final   Klebsiella oxytoca NOT DETECTED NOT DETECTED Final   Klebsiella pneumoniae NOT DETECTED NOT DETECTED Final   Proteus species NOT DETECTED NOT DETECTED Final   Serratia marcescens NOT DETECTED NOT DETECTED Final   Haemophilus influenzae NOT DETECTED NOT DETECTED Final   Neisseria meningitidis NOT DETECTED NOT DETECTED Final   Pseudomonas aeruginosa NOT DETECTED NOT DETECTED Final   Candida albicans NOT DETECTED NOT DETECTED Final   Candida glabrata NOT DETECTED NOT DETECTED Final   Candida krusei NOT DETECTED NOT DETECTED Final   Candida parapsilosis NOT DETECTED NOT DETECTED Final   Candida tropicalis NOT DETECTED NOT DETECTED Final  Culture, blood (Routine x 2)     Status: Abnormal   Collection  Time: 12/10/16  7:35 AM  Result Value Ref Range Status   Specimen Description BLOOD RIGHT HAND  Final   Special Requests IN PEDIATRIC BOTTLE 4CC  Final   Culture  Setup Time   Final    GRAM POSITIVE COCCI IN CLUSTERS AEROBIC BOTTLE ONLY CRITICAL VALUE NOTED.  VALUE IS CONSISTENT WITH PREVIOUSLY REPORTED AND CALLED VALUE.    Culture (A)  Final    STAPHYLOCOCCUS EPIDERMIDIS THE SIGNIFICANCE OF ISOLATING THIS ORGANISM FROM A SINGLE SET OF BLOOD CULTURES  WHEN MULTIPLE SETS ARE DRAWN IS UNCERTAIN. PLEASE NOTIFY THE MICROBIOLOGY DEPARTMENT WITHIN ONE WEEK IF SPECIATION AND SENSITIVITIES ARE REQUIRED. STAPHYLOCOCCUS HOMINIS SUSCEPTIBILITIES PERFORMED ON PREVIOUS CULTURE WITHIN THE LAST 5 DAYS.    Report Status 12/13/2016 FINAL  Final  Urine culture     Status: Abnormal   Collection Time: 12/10/16  9:52 AM  Result Value Ref Range Status   Specimen Description URINE, RANDOM  Final   Special Requests NONE  Final   Culture >=100,000 COLONIES/mL PSEUDOMONAS AERUGINOSA (A)  Final   Report Status 12/12/2016 FINAL  Final   Organism ID, Bacteria PSEUDOMONAS AERUGINOSA (A)  Final      Susceptibility   Pseudomonas aeruginosa - MIC*    CEFTAZIDIME 4 SENSITIVE Sensitive     CIPROFLOXACIN <=0.25 SENSITIVE Sensitive     GENTAMICIN <=1 SENSITIVE Sensitive     IMIPENEM 2 SENSITIVE Sensitive     PIP/TAZO <=4 SENSITIVE Sensitive     CEFEPIME 2 SENSITIVE Sensitive     * >=100,000 COLONIES/mL PSEUDOMONAS AERUGINOSA  MRSA PCR Screening     Status: None   Collection Time: 12/10/16 11:39 AM  Result Value Ref Range Status   MRSA by PCR NEGATIVE NEGATIVE Final    Comment:        The GeneXpert MRSA Assay (FDA approved for NASAL specimens only), is one component of a comprehensive MRSA colonization surveillance program. It is not intended to diagnose MRSA infection nor to guide or monitor treatment for MRSA infections.   Culture, blood (routine x 2)     Status: None (Preliminary result)   Collection  Time: 12/12/16  4:28 AM  Result Value Ref Range Status   Specimen Description BLOOD LEFT HAND  Final   Special Requests IN PEDIATRIC BOTTLE 3CC  Final   Culture NO GROWTH 3 DAYS  Final   Report Status PENDING  Incomplete  Culture, blood (routine x 2)     Status: Abnormal   Collection Time: 12/12/16  4:30 AM  Result Value Ref Range Status   Specimen Description BLOOD RIGHT HAND  Final   Special Requests IN PEDIATRIC BOTTLE 0.5CC  Final   Culture  Setup Time   Final    GRAM POSITIVE COCCI IN CLUSTERS IN PEDIATRIC BOTTLE CRITICAL RESULT CALLED TO, READ BACK BY AND VERIFIED WITH: Hughie Closs 12/13/16 @ 56 M VESTAL    Culture STAPHYLOCOCCUS EPIDERMIDIS (A)  Final   Report Status 12/15/2016 FINAL  Final   Organism ID, Bacteria STAPHYLOCOCCUS EPIDERMIDIS  Final      Susceptibility   Staphylococcus epidermidis - MIC*    CIPROFLOXACIN 4 RESISTANT Resistant     ERYTHROMYCIN >=8 RESISTANT Resistant     GENTAMICIN <=0.5 SENSITIVE Sensitive     OXACILLIN >=4 RESISTANT Resistant     TETRACYCLINE 2 SENSITIVE Sensitive     VANCOMYCIN 2 SENSITIVE Sensitive     TRIMETH/SULFA 20 SENSITIVE Sensitive     CLINDAMYCIN <=0.25 SENSITIVE Sensitive     RIFAMPIN <=0.5 SENSITIVE Sensitive     Inducible Clindamycin NEGATIVE Sensitive     * STAPHYLOCOCCUS EPIDERMIDIS  Blood Culture ID Panel (Reflexed)     Status: Abnormal   Collection Time: 12/12/16  4:30 AM  Result Value Ref Range Status   Enterococcus species NOT DETECTED NOT DETECTED Final   Listeria monocytogenes NOT DETECTED NOT DETECTED Final   Staphylococcus species DETECTED (A) NOT DETECTED Final    Comment: Methicillin (oxacillin) resistant coagulase negative staphylococcus. Possible blood culture contaminant (unless isolated from more than one blood  culture draw or clinical case suggests pathogenicity). No antibiotic treatment is indicated for blood  culture contaminants. CRITICAL RESULT CALLED TO, READ BACK BY AND VERIFIED WITH: Hughie Closs 12/13/16 @  39 M VESTAL    Staphylococcus aureus NOT DETECTED NOT DETECTED Final   Methicillin resistance DETECTED (A) NOT DETECTED Final    Comment: CRITICAL RESULT CALLED TO, READ BACK BY AND VERIFIED WITH: Hughie Closs 12/13/16 @ 3 M VESTAL    Streptococcus species NOT DETECTED NOT DETECTED Final   Streptococcus agalactiae NOT DETECTED NOT DETECTED Final   Streptococcus pneumoniae NOT DETECTED NOT DETECTED Final   Streptococcus pyogenes NOT DETECTED NOT DETECTED Final   Acinetobacter baumannii NOT DETECTED NOT DETECTED Final   Enterobacteriaceae species NOT DETECTED NOT DETECTED Final   Enterobacter cloacae complex NOT DETECTED NOT DETECTED Final   Escherichia coli NOT DETECTED NOT DETECTED Final   Klebsiella oxytoca NOT DETECTED NOT DETECTED Final   Klebsiella pneumoniae NOT DETECTED NOT DETECTED Final   Proteus species NOT DETECTED NOT DETECTED Final   Serratia marcescens NOT DETECTED NOT DETECTED Final   Haemophilus influenzae NOT DETECTED NOT DETECTED Final   Neisseria meningitidis NOT DETECTED NOT DETECTED Final   Pseudomonas aeruginosa NOT DETECTED NOT DETECTED Final   Candida albicans NOT DETECTED NOT DETECTED Final   Candida glabrata NOT DETECTED NOT DETECTED Final   Candida krusei NOT DETECTED NOT DETECTED Final   Candida parapsilosis NOT DETECTED NOT DETECTED Final   Candida tropicalis NOT DETECTED NOT DETECTED Final  Culture, blood (routine x 2)     Status: None (Preliminary result)   Collection Time: 12/14/16  9:45 AM  Result Value Ref Range Status   Specimen Description BLOOD RIGHT HAND  Final   Special Requests IN PEDIATRIC BOTTLE 1CC  Final   Culture NO GROWTH < 24 HOURS  Final   Report Status PENDING  Incomplete  Culture, blood (routine x 2)     Status: None (Preliminary result)   Collection Time: 12/14/16 10:00 AM  Result Value Ref Range Status   Specimen Description BLOOD LEFT FINGER  Final   Special Requests IN PEDIATRIC BOTTLE 1CC  Final   Culture NO GROWTH < 24  HOURS  Final   Report Status PENDING  Incomplete    FURTHER DISCHARGE INSTRUCTIONS:  Get Medicines reviewed and adjusted: Please take all your medications with you for your next visit with your Primary MD  Laboratory/radiological data: Please request your Primary MD to go over all hospital tests and procedure/radiological results at the follow up, please ask your Primary MD to get all Hospital records sent to his/her office.  In some cases, they will be blood work, cultures and biopsy results pending at the time of your discharge. Please request that your primary care M.D. goes through all the records of your hospital data and follows up on these results.  Also Note the following: If you experience worsening of your admission symptoms, develop shortness of breath, life threatening emergency, suicidal or homicidal thoughts you must seek medical attention immediately by calling 911 or calling your MD immediately  if symptoms less severe.  You must read complete instructions/literature along with all the possible adverse reactions/side effects for all the Medicines you take and that have been prescribed to you. Take any new Medicines after you have completely understood and accpet all the possible adverse reactions/side effects.   Do not drive when taking Pain medications or sleeping medications (Benzodaizepines)  Do not take more than prescribed Pain, Sleep and Anxiety  Medications. It is not advisable to combine anxiety,sleep and pain medications without talking with your primary care practitioner  Special Instructions: If you have smoked or chewed Tobacco  in the last 2 yrs please stop smoking, stop any regular Alcohol  and or any Recreational drug use.  Wear Seat belts while driving.  Please note: You were cared for by a hospitalist during your hospital stay. Once you are discharged, your primary care physician will handle any further medical issues. Please note that NO REFILLS for any  discharge medications will be authorized once you are discharged, as it is imperative that you return to your primary care physician (or establish a relationship with a primary care physician if you do not have one) for your post hospital discharge needs so that they can reassess your need for medications and monitor your lab values.  Total Time spent coordinating discharge including counseling, education and face to face time equals  45 minutes.  SignedOren Binet 12/15/2016 4:02 PM

## 2016-12-15 NOTE — Clinical Social Work Note (Signed)
Clinical Social Worker facilitated patient discharge including contacting patient family and facility to confirm patient discharge plans.  Clinical information faxed to facility and family agreeable with plan.  CSW arranged ambulance transport via [facility] to [facility].  RN to call report prior to discharge.  Clinical Social Worker will sign off for now as social work intervention is no longer needed. Please consult [place] again if new need arises.  Barbette Or, [place]

## 2016-12-16 ENCOUNTER — Telehealth: Payer: Self-pay | Admitting: *Deleted

## 2016-12-16 ENCOUNTER — Other Ambulatory Visit: Payer: Self-pay

## 2016-12-16 MED ORDER — LORAZEPAM 1 MG PO TABS: 1.0000 mg | ORAL_TABLET | ORAL | 5 refills | Status: DC | PRN

## 2016-12-16 MED ORDER — OXYCODONE HCL 5 MG/5ML PO SOLN: 5.0000 mg | ORAL | 0 refills | Status: DC | PRN

## 2016-12-16 MED ORDER — TRAMADOL HCL 50 MG PO TABS: 50.0000 mg | ORAL_TABLET | Freq: Three times a day (TID) | ORAL | 5 refills | Status: DC | PRN

## 2016-12-16 NOTE — Telephone Encounter (Signed)
Pt was on the TCM report admitted 3/16  for sepsis, further evaluation revealed coag-negative staph bacteremia and underlying sacral osteomyelitis.Initially on broad-spectrum antibiotics. Pt D/C 3/21, and sent to SNF. Per chart pt has not seen Dr. Jenny Reichmann since 2016...Tara Shaffer

## 2016-12-16 NOTE — Telephone Encounter (Signed)
[place] Phone : [phone] Fax: [phone]

## 2016-12-17 ENCOUNTER — Encounter: Payer: Self-pay | Admitting: Adult Health

## 2016-12-17 ENCOUNTER — Non-Acute Institutional Stay (SKILLED_NURSING_FACILITY): Payer: Medicare Other | Admitting: Adult Health

## 2016-12-17 DIAGNOSIS — E43 Unspecified severe protein-calorie malnutrition: Secondary | ICD-10-CM

## 2016-12-17 DIAGNOSIS — I82519 Chronic embolism and thrombosis of unspecified femoral vein: Secondary | ICD-10-CM | POA: Diagnosis not present

## 2016-12-17 DIAGNOSIS — D638 Anemia in other chronic diseases classified elsewhere: Secondary | ICD-10-CM

## 2016-12-17 DIAGNOSIS — S72142G Displaced intertrochanteric fracture of left femur, subsequent encounter for closed fracture with delayed healing: Secondary | ICD-10-CM | POA: Diagnosis not present

## 2016-12-17 DIAGNOSIS — F0391 Unspecified dementia with behavioral disturbance: Secondary | ICD-10-CM | POA: Diagnosis not present

## 2016-12-17 DIAGNOSIS — R627 Adult failure to thrive: Secondary | ICD-10-CM | POA: Diagnosis not present

## 2016-12-17 DIAGNOSIS — F1019 Alcohol abuse with unspecified alcohol-induced disorder: Secondary | ICD-10-CM | POA: Diagnosis not present

## 2016-12-17 DIAGNOSIS — F339 Major depressive disorder, recurrent, unspecified: Secondary | ICD-10-CM | POA: Diagnosis not present

## 2016-12-17 DIAGNOSIS — F0150 Vascular dementia without behavioral disturbance: Secondary | ICD-10-CM

## 2016-12-17 DIAGNOSIS — L89154 Pressure ulcer of sacral region, stage 4: Secondary | ICD-10-CM

## 2016-12-17 DIAGNOSIS — G309 Alzheimer's disease, unspecified: Secondary | ICD-10-CM | POA: Diagnosis not present

## 2016-12-17 LAB — CULTURE, BLOOD (ROUTINE X 2): CULTURE: NO GROWTH

## 2016-12-17 NOTE — Progress Notes (Signed)
Location:   [facility] Room Number: 228 A Place of Service:  SNF (31)   CODE STATUS: DNR  Allergies  Allergen Reactions  . Codeine Phosphate Nausea Only    Chief Complaint  Patient presents with  . Hospitalization Follow-up    Hospital Follow up    HPI:  She is a long term resident of this facility who has been hospitalized for sepsis and sacral osteomyelitis. She had severe hypernatremia and acute kidney injury. The focus of her care is now comfort only. She is unable to fully participate in the hpi or ros. There are nursing concerns at this time. She is followed by hospice care.   Past Medical History:  Diagnosis Date  . Adnexal mass   . Alcohol abuse 02/27/2012  . ALLERGIC RHINITIS 06/23/2007  . ANXIETY 06/23/2007  . ANXIETY DEPRESSION 06/12/2009  . Cancer ([place])    skin cx/ hx melanoma  . CKD (chronic kidney disease) stage 2, GFR 60-89 ml/min   . COLONIC POLYPS, HX OF 03/08/2002  . 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 Dementia   . DIVERTICULOSIS, COLON 08/04/2007  . GERD 06/23/2007  . HYPERLIPIDEMIA 06/24/2007  . HYPERTENSION 06/23/2007  . Irritable bowel syndrome 06/23/2007  . LOW BACK PAIN 06/24/2007  . MELANOMA, HX OF 08/04/2007  . Memory dysfunction 06/23/2011    Past Surgical History:  Procedure Laterality Date  . APPENDECTOMY    . CESAREAN SECTION     x 2  . COLONOSCOPY      Social History   Social History  . Marital status: Married    Spouse name: [name]  . Number of children: [name]  . Years of education: [name]   Occupational History  . Not on file.   Social History Main Topics  . Smoking status: Never Smoker  . Smokeless tobacco: Never Used  . Alcohol use 25.2 oz/week    42 Glasses of wine per week     Comment: 11/2016 " i Do not drink everyday"  . Drug use: No  . Sexual activity: No   Other Topics Concern  . Not on file   Social History Narrative  . No narrative on file   Family History  Problem Relation Age of  Onset  . Colon cancer Cousin   . Hypertension Other   . Diabetes Other       VITAL SIGNS BP (!) 120/56   Pulse 86   Temp 98.7 F (37.1 C)   Resp 18   Ht 5\' 5"  (1.651 m)   Wt 98 lb (44.5 kg)   SpO2 98%   BMI 16.31 kg/m   Patient's Medications  New Prescriptions   No medications on file  Previous Medications   GLYCOPYRROLATE (ROBINUL) 1 MG TABLET    Take 1 tablet (1 mg total) by mouth every 4 (four) hours as needed (excessive secretions).   LORAZEPAM (ATIVAN) 1 MG TABLET    Place 1 tablet (1 mg total) under the tongue every 4 (four) hours as needed for anxiety or sleep.   OXYCODONE (ROXICODONE) 5 MG/5ML SOLUTION    Take 5 mLs (5 mg total) by mouth every 2 (two) hours as needed for moderate pain (OR SOB OR ANXIETY).   POLYVINYL ALCOHOL (LIQUIFILM TEARS) 1.4 % OPHTHALMIC SOLUTION    Place 1 drop into both eyes 4 (four) times daily as needed for dry eyes.  Modified Medications   No medications on file  Discontinued Medications   HALOPERIDOL (HALDOL) 2 MG/ML SOLUTION  Place 0.3 mLs (0.6 mg total) under the tongue every 4 (four) hours as needed for agitation (or delirium).   TRAMADOL (ULTRAM) 50 MG TABLET    Take 1 tablet (50 mg total) by mouth every 8 (eight) hours as needed.     SIGNIFICANT DIAGNOSTIC EXAMS  07-31-16: 1.  No acute intracranial abnormality. 2.  Cerebral atrophy and small vessel ischemic change  08-03-16: ct of abdomen and pelvis: 1. Diffuse bladder wall thickening which may reflect cystitis. 2. Moderate to severe right and moderate left hydroureteronephrosis. No obstructing calculi. This may be secondary to #1. 3. Mild T12 and L2 compression fractures, new from 2014 though of indeterminate acuity. 4. **An incidental finding of potential clinical significance has been found. 4.6 cm left adnexal mass. Nonurgent evaluation with pelvic ultrasound is recommended.** 5. Aortic atherosclerosis.  08-21-16: left hip x-ray: Acute displaced LEFT femur intertrochanteric  fracture without dislocation. Old nonunited RIGHT femoral neck fracture. Old bilateral pelvic Fractures.  08-22-16: renal ultrasound: Limited exam. Moderate bilateral hydronephrosis. Nonspecific circumferential wall thickening of the urinary bladder.  08-23-16: renal ultrasound: Significant decrease in hydronephrosis since previous study with mild residual hydronephrosis on the right and no significant hydronephrosis on the left. Bladder is decompressed with a Foley catheter  08-24-16: ct of abdomen and pelvis: IMPRESSION: 1. No hematoma is identified. 2. Trace bilateral pleural effusions. 3. Gallbladder sludge or small stones. No evidence for cholecystitis. 4. Mild persistent hydronephrosis stable from prior renal ultrasound given differences in technique. 5. Chronic right and acute left proximal femoral fractures. Fat stranding and mild swelling of left upper thigh muscles is probably reactive edema. No discrete hematoma identified. 6. Stable T12 and L2 mild compression deformities. 7. **An incidental finding of potential clinical significance has been found. Left adnexal mass measuring up to 4.6 cm, further characterization with ultrasound is recommended. T  12-10-16: ct of head: No acute intracranial abnormality. Atrophy, chronic microvascular disease.  12-10-16: chest x-ray: Prior granulomatous disease. No acute abnormality noted.  12-10-16: renal ultrasound: 1. No acute renal findings. Right greater than left renal echogenicity raising the possibility of chronic medical renal disease. 2. Small volume of echogenic debris within the urinary bladder, correlate with urinalysis.   12-12-16: ct of pelvis: 1. Decubitus ulcer over the sacrum with minimal associated erosion seen posteriorly in a right sacral spine. This is consistent with at least mild osteomyelitis. An MRI could further assess. 2. Large stool ball in the rectum. Mild increased attenuation in the perirectal fat. The patient may be  developing stercoral colitis. 3. Chronic right hip fracture. 4. Superior migration of the left femur, distal to a previously seen acute intertrochanteric fracture. The femur has migrated superiorly and now extends into the left iliopsoas muscle. Surrounding calcification is likely due to callus formation and myositis ossificans. 5. Continued 4.6 cm mass in the left adnexum. An ultrasound could further evaluate if not already performed.     LABS REVIEWED:   08-10-16: wbc 6.1; hgb 11.0; hct 34.0; mcv 97.1; plt 163; glucose 78; bun 21.9; creat 1.22; k+ 3.5; na++ 142  08-12-16: wbc 8.6; hgb 11.3; hct 34.9; mcv 97.7; plt 166; glucose 73; bun 30.5; creat 1.35; k+ 3.4 na++ 143;  08-21-16; wbc 16.0; hgb 10.7; hct 33.6; mcv 97.1; plt 144; glucose 125; bun 90; bun 2.96; k+ 3.4; na++ 147; urine culture: yeast: diflucan  08-24-16: wbc 6.4; hgb 7.7; hct 24.7;mcv 102.1; plt 130; glucose 303; bun 46; creat 1.43; k+ 4.0; na++ 144; vit B 12: 458; folate 33.6;  iron 49; TIBC 140; ferritin 450  08-26-16: wbc 10.0; hgb 8.9; hct 28.0; mcv 98.9; plt 177; glucose 111; bun 27; creat 1.08; k+ 3.6; na++ 140  08-31-16: wbc 8.6; hgb 9.4; hct 28.3; mcv 98.4; plt 260; glucose 141; bun 16.0; creat 0.94; k+ 3.9; na++ 140  12-10-16: wbc 19.9; hgb 12.5; hct 44.7 ;mcv 100; plt 219; glucose 138; bun 213; creat 4.86; k+ 3.8; na++ 179; liver normal albumin 2.7; vit B 12: 820; mag 2.8; phos 5.2 12-13-16: wbc 10.5; hgb 8.7; hct 29.5; mcv 94.9; plt 110; glucose 103; bun 111; creat 2.54; k+ 2.9; na++ 151; mag 2.0 12-15-16: wbc 7.2; hgb 8.2; hct 27.1; mcv 91.9; plt 91; glucose 91; bun 72; creat 1.82; k+ 2.5; na++ 144    Review of Systems  Unable to perform ROS: Dementia    Physical Exam  Constitutional: No distress.  Frail   Eyes: Conjunctivae are normal.  Neck: Neck supple. No JVD present. No thyromegaly present.  Cardiovascular: Normal rate, regular rhythm and intact distal pulses.   Respiratory: Effort normal and breath sounds  normal. No respiratory distress. She has no wheezes.  GI: Soft. Bowel sounds are normal. She exhibits no distension. There is no tenderness.  Musculoskeletal: has generalized edema .  Has multiple contractures  Has foley    Lymphadenopathy:    She has no cervical adenopathy.  Neurological: She is alert.  Skin: Skin is warm and dry. She is not diaphoretic. Sacrum stage IV: 8.3 x 3.6 x 0.2 cm treated with santyl and calcium alginate       ASSESSMENT/ PLAN:  1. Left  femur intertrochanteric fracture: she is being treated conservatively. She has oxycodone 5 mg every 2 hours as needed for pain   2.  Acute renal failure with chronic stage II renal disease: bun 72; creat 1.82;  3. Urine retention: has bilateral hydronephrosis will monitor   4. Anemia: hgb 8.2; will continue iron three times daily   5. Hypertension: is currently not on medications will monitor   6. Dementia: is related to her alcohol abuse  The focus of her care is for comfort only  7. Anxiety with depression:  Has ativan 1 mg every 4 hours as needed  8. FTT/Severe protein calorie malnutrition: will continue supplements per facility protocol and will continue prostat 30 cc twice daily  Her weight is 98  pounds.  Albumin 2.7 (08-23-16)  9. Chronic left femoral DVT: is not on anticoagulation will monitor   10. Sacral decubitus: will continue her current plan of care and will continue to monitor her status.    Time spent with patient 50   minutes >50% time spent counseling; reviewing medical record; tests; labs; and developing future plan of care    MD is aware of resident's narcotic use and is in agreement with current plan of care. We will attempt to wean resident as apropriate    Ok Edwards NP [facility]  Contact [phone] Monday through Friday 8am- 5pm  After hours call [phone]

## 2016-12-19 LAB — CULTURE, BLOOD (ROUTINE X 2): Culture: NO GROWTH

## 2016-12-22 ENCOUNTER — Encounter: Payer: Self-pay | Admitting: Internal Medicine

## 2016-12-22 ENCOUNTER — Non-Acute Institutional Stay (SKILLED_NURSING_FACILITY): Payer: Medicare Other | Admitting: Internal Medicine

## 2016-12-22 DIAGNOSIS — R7881 Bacteremia: Secondary | ICD-10-CM | POA: Diagnosis not present

## 2016-12-22 DIAGNOSIS — F0391 Unspecified dementia with behavioral disturbance: Secondary | ICD-10-CM

## 2016-12-22 DIAGNOSIS — L89154 Pressure ulcer of sacral region, stage 4: Secondary | ICD-10-CM | POA: Diagnosis not present

## 2016-12-22 DIAGNOSIS — M4628 Osteomyelitis of vertebra, sacral and sacrococcygeal region: Secondary | ICD-10-CM | POA: Diagnosis not present

## 2016-12-22 NOTE — Progress Notes (Signed)
Provider:  Veleta Miners Location:   [facility] Room Number: 740/C Place of Service:  SNF (31)  PCP: [name], MD Patient Care Team: Biagio Borg, MD as PCP - General  Extended Emergency Contact Information Primary Emergency Contact: [facility] Address: [address]           [place], [place] [address] Johnnette Litter of [place] Phone: [phone] Work Phone: [phone] Mobile Phone: [phone] Relation: Son Secondary Emergency Contact: Corbello,Richard Address: [address]          [place] Johnnette Litter of [place] Phone: [phone] Relation: Son  Code Status: DNR Goals of Care: Advanced Directive information Advanced Directives 12/22/2016  Does Patient Have a Medical Advance Directive? Yes  Type of Advance Directive Out of facility DNR (pink MOST or yellow form)  Does patient want to make changes to medical advance directive? No - Patient declined  Copy of [place] in Chart? -  Would patient like information on creating a medical advance directive? No - Patient declined  Pre-existing out of facility DNR order (yellow form or pink MOST form) -      Chief Complaint  Patient presents with  . Readmit To SNF    HPI: Patient is a 71 y.o. female seen today for Readmission to Long term facility for Comfort care. 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 She was initially admitted to Hospital with altered Mental status including Lethargy and Tachycardia. She was found to be in sepsis due to Sacral osteomyelitis and Coagulase negative Staph.She also was dehydrated with Hypernatremia and acute renal failureHer family decided to make her comfort care with hospice.  Past Medical History:  Diagnosis Date  . Adnexal  mass   . Alcohol abuse 02/27/2012  . ALLERGIC RHINITIS 06/23/2007  . ANXIETY 06/23/2007  . ANXIETY DEPRESSION 06/12/2009  . Cancer ([place])    skin cx/ hx melanoma  . CKD (chronic kidney disease) stage 2, GFR 60-89 ml/min   . COLONIC POLYPS, HX OF 03/08/2002  . 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 Dementia   . DIVERTICULOSIS, COLON 08/04/2007  . GERD 06/23/2007  . HYPERLIPIDEMIA 06/24/2007  . HYPERTENSION 06/23/2007  . Irritable bowel syndrome 06/23/2007  . LOW BACK PAIN 06/24/2007  . MELANOMA, HX OF 08/04/2007  . Memory dysfunction 06/23/2011   Past Surgical History:  Procedure Laterality Date  . APPENDECTOMY    . CESAREAN SECTION     x 2  . COLONOSCOPY      reports that she has never smoked. She has never used smokeless tobacco. She reports that she drinks about 25.2 oz of alcohol per week . She reports that she does not use drugs. Social History   Social History  . Marital status: Married    Spouse name: [name]  . Number of children: [name]  . Years of education: [name]   Occupational History  . Not on file.   Social History Main Topics  . Smoking status: Never Smoker  . Smokeless tobacco: Never Used  . Alcohol use 25.2 oz/week    42 Glasses of wine per week     Comment: 11/2016 " i Do not drink everyday"  . Drug use: No  . Sexual activity: No   Other Topics Concern  . Not on file   Social History Narrative  .  No narrative on file    Functional Status Survey:    Family History  Problem Relation Age of Onset  . Colon cancer Cousin   . Hypertension Other   . Diabetes Other     Health Maintenance  Topic Date Due  . INFLUENZA VACCINE  11/23/2017 (Originally 04/27/2016)  . MAMMOGRAM  11/23/2017 (Originally 08/06/2011)  . DEXA SCAN  11/23/2017 (Originally 06/13/2012)  . Hepatitis C Screening  11/23/2017 (Originally June 27, 1946)  . PNA vac Low Risk Adult (2 of 2 - PPSV23) 11/23/2017 (Originally 08/01/2015)  . COLONOSCOPY  07/12/2017  . TETANUS/TDAP  07/18/2024     Allergies  Allergen Reactions  . Codeine Phosphate Nausea Only    Allergies as of 12/22/2016      Reactions   Codeine Phosphate Nausea Only      Medication List       Accurate as of 12/22/16 12:50 PM. Always use your most recent med list.          glycopyrrolate 1 MG tablet Commonly known as:  ROBINUL Take 1 tablet (1 mg total) by mouth every 4 (four) hours as needed (excessive secretions).   LORazepam 1 MG tablet Commonly known as:  ATIVAN Place 1 tablet (1 mg total) under the tongue every 4 (four) hours as needed for anxiety or sleep.   oxyCODONE 5 MG/5ML solution Commonly known as:  ROXICODONE Take 5 mLs (5 mg total) by mouth every 2 (two) hours as needed for moderate pain (OR SOB OR ANXIETY).   polyvinyl alcohol 1.4 % ophthalmic solution Commonly known as:  LIQUIFILM TEARS Place 1 drop into both eyes 4 (four) times daily as needed for dry eyes.       Review of Systems  Reason unable to perform ROS: Not reliable due to her dementia.       Vitals:   12/22/16 1249  BP: (!) 120/56  Pulse: 86  Resp: 18  Temp: 98.7 F (37.1 C)   There is no height or weight on file to calculate BMI. Physical Exam  Constitutional: She appears well-developed. She appears cachectic.  HENT:  Head: Normocephalic.  Mouth/Throat: Mucous membranes are dry.  Eyes: Pupils are equal, round, and reactive to light.  Neck: Neck supple.  Cardiovascular: Normal rate, regular rhythm and normal heart sounds.   Pulmonary/Chest: Effort normal and breath sounds normal. No respiratory distress. She has no wheezes. She has no rales.  Abdominal: Soft. Bowel sounds are normal. She exhibits no distension. There is no tenderness. There is no rebound.  Musculoskeletal: She exhibits no edema.  Patient has contractures of both Legs and arma.  Neurological: She is alert.  Not oriented. Patient does responds to simple commands. Don't know how much she understands her prognosis  Skin:  Patient has  pressure ulcer in both Feet and sacral area. Was unable to see as wound care nurse not available.    Labs reviewed: Basic Metabolic Panel:  Recent Labs  12/10/16 1332  12/11/16 0255  12/12/16 0428  12/13/16 0315 12/14/16 0423 12/15/16 0611  NA  --   < > 169*  < > 156*  < > 151* 150* 144  K  --   < > 3.2*  < > 3.4*  < > 2.9* 3.2* 2.5*  CL  --   < > 130*  < > 125*  < > 121* 120* 118*  CO2  --   < > 25  < > 19*  < > 18* 17* 15*  GLUCOSE  --   < >  304*  < > 162*  < > 103* 77 91  BUN  --   < > 183*  < > 148*  < > 111* 92* 72*  CREATININE  --   < > 4.12*  < > 3.21*  < > 2.54* 2.15* 1.82*  CALCIUM  --   < > 7.5*  < > 8.0*  < > 7.7* 7.9* 7.5*  MG 2.9*  --  2.6*  --  2.3  --  2.0  --   --   PHOS 5.2*  --  4.2  --   --   --   --   --   --   < > = values in this interval not displayed. Liver Function Tests:  Recent Labs  12/10/16 0730 12/11/16 0255 12/12/16 0428  AST 38 26 26  ALT 20 18 16   ALKPHOS 68 56 66  BILITOT 0.9 0.6 0.5  PROT 7.3 5.4* 5.7*  ALBUMIN 2.7* 1.9* 1.9*    Recent Labs  12/10/16 0730  LIPASE 55*    Recent Labs  12/10/16 1927  AMMONIA 26   CBC:  Recent Labs  11/03/16 1015 12/10/16 0730  12/12/16 0428 12/13/16 0315 12/14/16 0423 12/15/16 0611  WBC 6.9 19.9*  < > 14.2* 10.5 9.1 7.2  NEUTROABS 3.8 16.9*  --  11.6*  --   --   --   HGB 9.4* 12.5  < > 9.5* 8.7* 8.8* 8.2*  HCT 31.3* 44.7  < > 33.1* 29.5* 29.4* 27.1*  MCV 94.8 100.0  < > 97.6 94.9 93.9 91.9  PLT 220 219  < > 107* 110* 104* 91*  < > = values in this interval not displayed. Cardiac Enzymes: No results for input(s): CKTOTAL, CKMB, CKMBINDEX, TROPONINI in the last 8760 hours. BNP: Invalid input(s): POCBNP Lab Results  Component Value Date   HGBA1C 4.8 10/31/2014   Lab Results  Component Value Date   TSH 4.39 10/31/2014   Lab Results  Component Value Date   VITAMINB12 820 12/10/2016   Lab Results  Component Value Date   FOLATE 33.6 08/24/2016   Lab Results  Component  Value Date   IRON 49 08/24/2016   TIBC 140 (L) 08/24/2016   FERRITIN 450 (H) 08/24/2016    Imaging and Procedures obtained prior to SNF admission: Ct Head Wo Contrast  Result Date: 12/10/2016 CLINICAL DATA:  Sepsis.  Unresponsive. EXAM: CT HEAD WITHOUT CONTRAST TECHNIQUE: Contiguous axial images were obtained from the base of the skull through the vertex without intravenous contrast. COMPARISON:  11/03/2016 FINDINGS: Brain: There is atrophy and chronic small vessel disease changes. 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 No acute intracranial abnormality. Specifically, no hemorrhage, hydrocephalus, mass lesion, acute infarction, or significant intracranial injury. Vascular: No hyperdense vessel or unexpected calcification. Skull: No acute calvarial abnormality. Sinuses/Orbits: Visualized paranasal sinuses and mastoids clear. Orbital soft tissues unremarkable. Other: None IMPRESSION: No acute intracranial abnormality. Atrophy, chronic microvascular disease. Electronically Signed   By: Rolm Baptise M.D.   On: 12/10/2016 09:43   US Renal  Result Date: 12/10/2016 CLINICAL DATA:  71 year old female with acute renal injury. EXAM: RENAL / URINARY TRACT ULTRASOUND COMPLETE COMPARISON:  Noncontrast CT Abdomen and Pelvis 08/24/2016. Renal ultrasound 08/23/2016 FINDINGS: Right Kidney: Length: 8.3 cm. Chronic increased renal echogenicity. Prominence of the left lower pole collecting system is stable since November with no overt hydronephrosis. No right renal mass identified. Left Kidney: Length: 10.2 cm. This kidney appears less echogenic than the right. No hydronephrosis  or left renal mass. Bladder: Dependent echogenic debris (images 33, 40).  Diminutive bladder. IMPRESSION: 1. No acute renal findings. Right greater than left renal echogenicity raising the possibility of chronic medical renal disease. 2. Small volume of echogenic debris within the urinary bladder, correlate with urinalysis. Electronically  Signed   By: Genevie Ann M.D.   On: 12/10/2016 11:31   Dg Chest Port 1 View  Result Date: 12/10/2016 CLINICAL DATA:  Shortness of Breath EXAM: PORTABLE CHEST 1 VIEW COMPARISON:  07/31/2016 FINDINGS: Cardiac shadow is within normal limits. Calcified granulomas are noted on the right. The lungs are well aerated bilaterally. No focal infiltrate or sizable effusion is seen. No acute bony abnormality is noted. Old rib fractures are again seen on the left. IMPRESSION: Prior granulomatous disease.  No acute abnormality noted. Electronically Signed   By: Inez Catalina M.D.   On: 12/10/2016 07:47    Assessment/Plan Bacteremia Due to Coagulase Negative Staph with Sacral Osteomyelitis. Patient is now comfort care with [facility]. Patient is off all her meds. She is afebrile and Comfortable. She is on Oxycodone. Will Make her Diet Regular. Will provide Supportive Care.  Family/ staff Communication:   Labs/tests ordered:

## 2017-01-06 ENCOUNTER — Other Ambulatory Visit: Payer: Self-pay

## 2017-01-06 DIAGNOSIS — R627 Adult failure to thrive: Secondary | ICD-10-CM | POA: Insufficient documentation

## 2017-01-06 MED ORDER — TRAMADOL HCL 50 MG PO TABS: 50.0000 mg | ORAL_TABLET | Freq: Three times a day (TID) | ORAL | 5 refills | Status: DC | PRN

## 2017-01-06 NOTE — Telephone Encounter (Signed)
RX faxed to [facility] @ [phone], phone number [phone] 

## 2017-01-12 ENCOUNTER — Non-Acute Institutional Stay (SKILLED_NURSING_FACILITY): Payer: Medicare Other | Admitting: Adult Health

## 2017-01-12 ENCOUNTER — Encounter: Payer: Self-pay | Admitting: Adult Health

## 2017-01-12 DIAGNOSIS — I82519 Chronic embolism and thrombosis of unspecified femoral vein: Secondary | ICD-10-CM

## 2017-01-12 DIAGNOSIS — I10 Essential (primary) hypertension: Secondary | ICD-10-CM | POA: Diagnosis not present

## 2017-01-12 DIAGNOSIS — F0150 Vascular dementia without behavioral disturbance: Secondary | ICD-10-CM

## 2017-01-12 DIAGNOSIS — R627 Adult failure to thrive: Secondary | ICD-10-CM

## 2017-01-12 DIAGNOSIS — S72142G Displaced intertrochanteric fracture of left femur, subsequent encounter for closed fracture with delayed healing: Secondary | ICD-10-CM | POA: Diagnosis not present

## 2017-01-12 DIAGNOSIS — D638 Anemia in other chronic diseases classified elsewhere: Secondary | ICD-10-CM

## 2017-01-12 NOTE — Progress Notes (Signed)
Location:   [facility] Room Number: 228 A Place of Service:  SNF (31)   CODE STATUS: DNR  Allergies  Allergen Reactions  . Codeine Phosphate Nausea Only    Chief Complaint  Patient presents with  . Medical Management of Chronic Issues    1 month follow up    HPI:  She is a long term resident of this facility being seen for the management of her chronic illnesses. She continues to be followed by hospice care. She is unable to participate in the hpi or ros. There are no nursing concerns at this time.    Past Medical History:  Diagnosis Date  . Adnexal mass   . Alcohol abuse 02/27/2012  . ALLERGIC RHINITIS 06/23/2007  . ANXIETY 06/23/2007  . ANXIETY DEPRESSION 06/12/2009  . Cancer ([place])    skin cx/ hx melanoma  . CKD (chronic kidney disease) stage 2, GFR 60-89 ml/min   . COLONIC POLYPS, HX OF 03/08/2002  . 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 Dementia   . DIVERTICULOSIS, COLON 08/04/2007  . GERD 06/23/2007  . HYPERLIPIDEMIA 06/24/2007  . HYPERTENSION 06/23/2007  . Irritable bowel syndrome 06/23/2007  . LOW BACK PAIN 06/24/2007  . MELANOMA, HX OF 08/04/2007  . Memory dysfunction 06/23/2011    Past Surgical History:  Procedure Laterality Date  . APPENDECTOMY    . CESAREAN SECTION     x 2  . COLONOSCOPY      Social History   Social History  . Marital status: Married    Spouse name: [name]  . Number of children: [name]  . Years of education: [name]   Occupational History  . Not on file.   Social History Main Topics  . Smoking status: Never Smoker  . Smokeless tobacco: Never Used  . Alcohol use 25.2 oz/week    42 Glasses of wine per week     Comment: 11/2016 " i Do not drink everyday"  . Drug use: No  . Sexual activity: No   Other Topics Concern  . Not on file   Social History Narrative  . No narrative on file   Family History  Problem Relation Age of Onset  . Colon cancer Cousin   . Hypertension Other   . Diabetes Other        VITAL SIGNS BP 116/72   Pulse 68   Temp 97.5 F (36.4 C)   Resp 14   Ht 5\' 5"  (1.651 m)   Wt 104 lb 4.8 oz (47.3 kg)   SpO2 99%   BMI 17.36 kg/m   Patient's Medications  New Prescriptions   No medications on file  Previous Medications   GLYCOPYRROLATE (ROBINUL) 1 MG TABLET    Take 1 tablet (1 mg total) by mouth every 4 (four) hours as needed (excessive secretions).   LORAZEPAM (ATIVAN) 1 MG TABLET    Take 1 mg by mouth every 4 (four) hours as needed for anxiety.   MULTIPLE VITAMINS-MINERALS (DECUBI-VITE) CAPS    Give 1 capsule by mouth daily for wound healing   OXYCODONE (ROXICODONE) 5 MG/5ML SOLUTION    Take 5 mLs (5 mg total) by mouth every 2 (two) hours as needed for moderate pain (OR SOB OR ANXIETY).   POLYVINYL ALCOHOL (LIQUIFILM TEARS) 1.4 % OPHTHALMIC SOLUTION    Place 1 drop into both eyes 4 (four) times daily as needed for dry eyes.  Modified Medications   No medications on file  Discontinued Medications   LORAZEPAM (ATIVAN)  1 MG TABLET    Place 1 tablet (1 mg total) under the tongue every 4 (four) hours as needed for anxiety or sleep.   TRAMADOL (ULTRAM) 50 MG TABLET    Take 1 tablet (50 mg total) by mouth every 8 (eight) hours as needed.     SIGNIFICANT DIAGNOSTIC EXAMS  07-31-16: 1.  No acute intracranial abnormality. 2.  Cerebral atrophy and small vessel ischemic change  08-03-16: ct of abdomen and pelvis: 1. Diffuse bladder wall thickening which may reflect cystitis. 2. Moderate to severe right and moderate left hydroureteronephrosis. No obstructing calculi. This may be secondary to #1. 3. Mild T12 and L2 compression fractures, new from 2014 though of indeterminate acuity. 4. **An incidental finding of potential clinical significance has been found. 4.6 cm left adnexal mass. Nonurgent evaluation with pelvic ultrasound is recommended.** 5. Aortic atherosclerosis.  08-21-16: left hip x-ray: Acute displaced LEFT femur intertrochanteric fracture without  dislocation. Old nonunited RIGHT femoral neck fracture. Old bilateral pelvic Fractures.  08-22-16: renal ultrasound: Limited exam. Moderate bilateral hydronephrosis. Nonspecific circumferential wall thickening of the urinary bladder.  08-23-16: renal ultrasound: Significant decrease in hydronephrosis since previous study with mild residual hydronephrosis on the right and no significant hydronephrosis on the left. Bladder is decompressed with a Foley catheter  08-24-16: ct of abdomen and pelvis: IMPRESSION: 1. No hematoma is identified. 2. Trace bilateral pleural effusions. 3. Gallbladder sludge or small stones. No evidence for cholecystitis. 4. Mild persistent hydronephrosis stable from prior renal ultrasound given differences in technique. 5. Chronic right and acute left proximal femoral fractures. Fat stranding and mild swelling of left upper thigh muscles is probably reactive edema. No discrete hematoma identified. 6. Stable T12 and L2 mild compression deformities. 7. **An incidental finding of potential clinical significance has been found. Left adnexal mass measuring up to 4.6 cm, further characterization with ultrasound is recommended. T  12-10-16: ct of head: No acute intracranial abnormality. Atrophy, chronic microvascular disease.  12-10-16: chest x-ray: Prior granulomatous disease. No acute abnormality noted.  12-10-16: renal ultrasound: 1. No acute renal findings. Right greater than left renal echogenicity raising the possibility of chronic medical renal disease. 2. Small volume of echogenic debris within the urinary bladder, correlate with urinalysis.   12-12-16: ct of pelvis: 1. Decubitus ulcer over the sacrum with minimal associated erosion seen posteriorly in a right sacral spine. This is consistent with at least mild osteomyelitis. An MRI could further assess. 2. Large stool ball in the rectum. Mild increased attenuation in the perirectal fat. The patient may be developing  stercoral colitis. 3. Chronic right hip fracture. 4. Superior migration of the left femur, distal to a previously seen acute intertrochanteric fracture. The femur has migrated superiorly and now extends into the left iliopsoas muscle. Surrounding calcification is likely due to callus formation and myositis ossificans. 5. Continued 4.6 cm mass in the left adnexum. An ultrasound could further evaluate if not already performed.     LABS REVIEWED:   08-10-16: wbc 6.1; hgb 11.0; hct 34.0; mcv 97.1; plt 163; glucose 78; bun 21.9; creat 1.22; k+ 3.5; na++ 142  08-12-16: wbc 8.6; hgb 11.3; hct 34.9; mcv 97.7; plt 166; glucose 73; bun 30.5; creat 1.35; k+ 3.4 na++ 143;  08-21-16; wbc 16.0; hgb 10.7; hct 33.6; mcv 97.1; plt 144; glucose 125; bun 90; bun 2.96; k+ 3.4; na++ 147; urine culture: yeast: diflucan  08-24-16: wbc 6.4; hgb 7.7; hct 24.7;mcv 102.1; plt 130; glucose 303; bun 46; creat 1.43; k+ 4.0; na++ 144;  vit B 12: 458; folate 33.6; iron 49; TIBC 140; ferritin 450  08-26-16: wbc 10.0; hgb 8.9; hct 28.0; mcv 98.9; plt 177; glucose 111; bun 27; creat 1.08; k+ 3.6; na++ 140  08-31-16: wbc 8.6; hgb 9.4; hct 28.3; mcv 98.4; plt 260; glucose 141; bun 16.0; creat 0.94; k+ 3.9; na++ 140  12-10-16: wbc 19.9; hgb 12.5; hct 44.7 ;mcv 100; plt 219; glucose 138; bun 213; creat 4.86; k+ 3.8; na++ 179; liver normal albumin 2.7; vit B 12: 820; mag 2.8; phos 5.2 12-13-16: wbc 10.5; hgb 8.7; hct 29.5; mcv 94.9; plt 110; glucose 103; bun 111; creat 2.54; k+ 2.9; na++ 151; mag 2.0 12-15-16: wbc 7.2; hgb 8.2; hct 27.1; mcv 91.9; plt 91; glucose 91; bun 72; creat 1.82; k+ 2.5; na++ 144    Review of Systems  Unable to perform ROS: Dementia    Physical Exam  Constitutional: No distress.  Frail   Eyes: Conjunctivae are normal.  Neck: Neck supple. No JVD present. No thyromegaly present.  Cardiovascular: Normal rate, regular rhythm and intact distal pulses.   Respiratory: Effort normal and breath sounds normal. No  respiratory distress. She has no wheezes.  GI: Soft. Bowel sounds are normal. She exhibits no distension. There is no tenderness.  Musculoskeletal: has generalized edema .  Has multiple contractures  Has foley    Lymphadenopathy:    She has no cervical adenopathy.  Neurological: She is alert.  Skin: Skin is warm and dry. She is not diaphoretic.        ASSESSMENT/ PLAN:  1. Left  femur intertrochanteric fracture: she is being treated conservatively. She has oxycodone 5 mg every 2 hours as needed for pain   2.   chronic stage II renal disease: bun 72; creat 1.82;  3. Urine retention: has bilateral hydronephrosis will monitor   4. Anemia: hgb 8.2; is not on iron   5. Hypertension: is currently not on medications will monitor   6. Dementia: is related to her alcohol abuse  The focus of her care is for comfort only  7. Anxiety with depression:  Has ativan 1 mg every 4 hours as needed  8. FTT/Severe protein calorie malnutrition: will continue supplements per facility protocol  Her weight is 104  pounds.  Albumin 2.7 (08-23-16)  9. Chronic left femoral DVT: is not on anticoagulation will monitor     MD is aware of resident's narcotic use and is in agreement with current plan of care. We will attempt to wean resident as apropriate   Ok Edwards NP [facility]  Contact [phone] Monday through Friday 8am- 5pm  After hours call [phone]

## 2017-01-21 ENCOUNTER — Encounter: Payer: Self-pay | Admitting: Adult Health

## 2017-01-21 NOTE — Progress Notes (Signed)
This encounter was created in error - please disregard.

## 2017-01-21 NOTE — Progress Notes (Signed)
Location:   [facility] Room Number: 228 A Place of Service:  SNF (31)   CODE STATUS: DNR  Allergies  Allergen Reactions  . Codeine Phosphate Nausea Only    Chief Complaint  Patient presents with  . Medical Management of Chronic Issues    1 month follow up    HPI:    Past Medical History:  Diagnosis Date  . Adnexal mass   . Alcohol abuse 02/27/2012  . ALLERGIC RHINITIS 06/23/2007  . ANXIETY 06/23/2007  . ANXIETY DEPRESSION 06/12/2009  . Cancer ([place])    skin cx/ hx melanoma  . CKD (chronic kidney disease) stage 2, GFR 60-89 ml/min   . COLONIC POLYPS, HX OF 03/08/2002  . 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 Dementia   . DIVERTICULOSIS, COLON 08/04/2007  . GERD 06/23/2007  . HYPERLIPIDEMIA 06/24/2007  . HYPERTENSION 06/23/2007  . Irritable bowel syndrome 06/23/2007  . LOW BACK PAIN 06/24/2007  . MELANOMA, HX OF 08/04/2007  . Memory dysfunction 06/23/2011    Past Surgical History:  Procedure Laterality Date  . APPENDECTOMY    . CESAREAN SECTION     x 2  . COLONOSCOPY      Social History   Social History  . Marital status: Married    Spouse name: [name]  . Number of children: [name]  . Years of education: [name]   Occupational History  . Not on file.   Social History Main Topics  . Smoking status: Never Smoker  . Smokeless tobacco: Never Used  . Alcohol use 25.2 oz/week    42 Glasses of wine per week     Comment: 11/2016 " i Do not drink everyday"  . Drug use: No  . Sexual activity: No   Other Topics Concern  . Not on file   Social History Narrative  . No narrative on file   Family History  Problem Relation Age of Onset  . Colon cancer Cousin   . Hypertension Other   . Diabetes Other       VITAL SIGNS Ht 5' (1.524 m)   Wt 104 lb 4.8 oz (47.3 kg)   BMI 20.37 kg/m   Patient's Medications  New Prescriptions   No medications on file  Previous Medications   GLYCOPYRROLATE (ROBINUL) 1 MG TABLET    Take 1 tablet (1 mg total)  by mouth every 4 (four) hours as needed (excessive secretions).   LORAZEPAM (ATIVAN) 1 MG TABLET    Take 1 mg by mouth every 4 (four) hours as needed for anxiety.   MULTIPLE VITAMINS-MINERALS (DECUBI-VITE) CAPS    Give 1 capsule by mouth daily for wound healing   OXYCODONE (ROXICODONE) 5 MG/5ML SOLUTION    Take 5 mLs (5 mg total) by mouth every 2 (two) hours as needed for moderate pain (OR SOB OR ANXIETY).   POLYVINYL ALCOHOL (LIQUIFILM TEARS) 1.4 % OPHTHALMIC SOLUTION    Place 1 drop into both eyes 4 (four) times daily as needed for dry eyes.   UNABLE TO FIND    HSG Regular Diet - HSG Regular texture, Nectar consistency  Modified Medications   No medications on file  Discontinued Medications   No medications on file     SIGNIFICANT DIAGNOSTIC EXAMS  07-31-16: 1.  No acute intracranial abnormality. 2.  Cerebral atrophy and small vessel ischemic change  08-03-16: ct of abdomen and pelvis: 1. Diffuse bladder wall thickening which may reflect cystitis. 2. Moderate to severe right and moderate left hydroureteronephrosis. No  obstructing calculi. This may be secondary to #1. 3. Mild T12 and L2 compression fractures, new from 2014 though of indeterminate acuity. 4. **An incidental finding of potential clinical significance has been found. 4.6 cm left adnexal mass. Nonurgent evaluation with pelvic ultrasound is recommended.** 5. Aortic atherosclerosis.  08-21-16: left hip x-ray: Acute displaced LEFT femur intertrochanteric fracture without dislocation. Old nonunited RIGHT femoral neck fracture. Old bilateral pelvic Fractures.  08-22-16: renal ultrasound: Limited exam. Moderate bilateral hydronephrosis. Nonspecific circumferential wall thickening of the urinary bladder.  08-23-16: renal ultrasound: Significant decrease in hydronephrosis since previous study with mild residual hydronephrosis on the right and no significant hydronephrosis on the left. Bladder is decompressed with a Foley  catheter  08-24-16: ct of abdomen and pelvis: IMPRESSION: 1. No hematoma is identified. 2. Trace bilateral pleural effusions. 3. Gallbladder sludge or small stones. No evidence for cholecystitis. 4. Mild persistent hydronephrosis stable from prior renal ultrasound given differences in technique. 5. Chronic right and acute left proximal femoral fractures. Fat stranding and mild swelling of left upper thigh muscles is probably reactive edema. No discrete hematoma identified. 6. Stable T12 and L2 mild compression deformities. 7. **An incidental finding of potential clinical significance has been found. Left adnexal mass measuring up to 4.6 cm, further characterization with ultrasound is recommended. T  12-10-16: ct of head: No acute intracranial abnormality. Atrophy, chronic microvascular disease.  12-10-16: chest x-ray: Prior granulomatous disease. No acute abnormality noted.  12-10-16: renal ultrasound: 1. No acute renal findings. Right greater than left renal echogenicity raising the possibility of chronic medical renal disease. 2. Small volume of echogenic debris within the urinary bladder, correlate with urinalysis.   12-12-16: ct of pelvis: 1. Decubitus ulcer over the sacrum with minimal associated erosion seen posteriorly in a right sacral spine. This is consistent with at least mild osteomyelitis. An MRI could further assess. 2. Large stool ball in the rectum. Mild increased attenuation in the perirectal fat. The patient may be developing stercoral colitis. 3. Chronic right hip fracture. 4. Superior migration of the left femur, distal to a previously seen acute intertrochanteric fracture. The femur has migrated superiorly and now extends into the left iliopsoas muscle. Surrounding calcification is likely due to callus formation and myositis ossificans. 5. Continued 4.6 cm mass in the left adnexum. An ultrasound could further evaluate if not already performed.     LABS REVIEWED:    08-10-16: wbc 6.1; hgb 11.0; hct 34.0; mcv 97.1; plt 163; glucose 78; bun 21.9; creat 1.22; k+ 3.5; na++ 142  08-12-16: wbc 8.6; hgb 11.3; hct 34.9; mcv 97.7; plt 166; glucose 73; bun 30.5; creat 1.35; k+ 3.4 na++ 143;  08-21-16; wbc 16.0; hgb 10.7; hct 33.6; mcv 97.1; plt 144; glucose 125; bun 90; bun 2.96; k+ 3.4; na++ 147; urine culture: yeast: diflucan  08-24-16: wbc 6.4; hgb 7.7; hct 24.7;mcv 102.1; plt 130; glucose 303; bun 46; creat 1.43; k+ 4.0; na++ 144; vit B 12: 458; folate 33.6; iron 49; TIBC 140; ferritin 450  08-26-16: wbc 10.0; hgb 8.9; hct 28.0; mcv 98.9; plt 177; glucose 111; bun 27; creat 1.08; k+ 3.6; na++ 140  08-31-16: wbc 8.6; hgb 9.4; hct 28.3; mcv 98.4; plt 260; glucose 141; bun 16.0; creat 0.94; k+ 3.9; na++ 140  12-10-16: wbc 19.9; hgb 12.5; hct 44.7 ;mcv 100; plt 219; glucose 138; bun 213; creat 4.86; k+ 3.8; na++ 179; liver normal albumin 2.7; vit B 12: 820; mag 2.8; phos 5.2 12-13-16: wbc 10.5; hgb 8.7; hct 29.5; mcv 94.9;  plt 110; glucose 103; bun 111; creat 2.54; k+ 2.9; na++ 151; mag 2.0 12-15-16: wbc 7.2; hgb 8.2; hct 27.1; mcv 91.9; plt 91; glucose 91; bun 72; creat 1.82; k+ 2.5; na++ 144    Review of Systems  Unable to perform ROS: Dementia    Physical Exam  Constitutional: No distress.  Frail   Eyes: Conjunctivae are normal.  Neck: Neck supple. No JVD present. No thyromegaly present.  Cardiovascular: Normal rate, regular rhythm and intact distal pulses.   Respiratory: Effort normal and breath sounds normal. No respiratory distress. She has no wheezes.  GI: Soft. Bowel sounds are normal. She exhibits no distension. There is no tenderness.  Musculoskeletal: has generalized edema .  Has multiple contractures  Has foley    Lymphadenopathy:    She has no cervical adenopathy.  Neurological: She is alert.  Skin: Skin is warm and dry. She is not diaphoretic. Sacrum stage IV: 8.3 x 3.6 x 0.2 cm treated with santyl and calcium alginate       ASSESSMENT/  PLAN:  1. Left  femur intertrochanteric fracture: she is being treated conservatively. She has oxycodone 5 mg every 2 hours as needed for pain   2.  Acute renal failure with chronic stage II renal disease: bun 72; creat 1.82;  3. Urine retention: has bilateral hydronephrosis will monitor   4. Anemia: hgb 8.2; will continue iron three times daily   5. Hypertension: is currently not on medications will monitor   6. Dementia: is related to her alcohol abuse  The focus of her care is for comfort only  7. Anxiety with depression:  Has ativan 1 mg every 4 hours as needed  8. FTT/Severe protein calorie malnutrition: will continue supplements per facility protocol and will continue prostat 30 cc twice daily  Her weight is 98  pounds.  Albumin 2.7 (08-23-16)  9. Chronic left femoral DVT: is not on anticoagulation will monitor   10. Sacral decubitus: will continue her current plan of care and will continue to monitor her status.    Time spent with patient 50   minutes >50% time spent counseling; reviewing medical record; tests; labs; and developing future plan of care  MD is aware of resident's narcotic use and is in agreement with current plan of care. We will attempt to wean resident as apropriate   Ok Edwards NP [facility]  Contact [phone] Monday through Friday 8am- 5pm  After hours call [phone]

## 2017-02-14 DIAGNOSIS — Z515 Encounter for palliative care: Secondary | ICD-10-CM

## 2017-02-15 ENCOUNTER — Encounter: Payer: Self-pay | Admitting: Adult Health

## 2017-02-15 ENCOUNTER — Non-Acute Institutional Stay (SKILLED_NURSING_FACILITY): Payer: Medicare Other | Admitting: Adult Health

## 2017-02-15 DIAGNOSIS — I82519 Chronic embolism and thrombosis of unspecified femoral vein: Secondary | ICD-10-CM | POA: Diagnosis not present

## 2017-02-15 DIAGNOSIS — R627 Adult failure to thrive: Secondary | ICD-10-CM

## 2017-02-15 DIAGNOSIS — F0150 Vascular dementia without behavioral disturbance: Secondary | ICD-10-CM | POA: Diagnosis not present

## 2017-02-15 DIAGNOSIS — D638 Anemia in other chronic diseases classified elsewhere: Secondary | ICD-10-CM | POA: Diagnosis not present

## 2017-02-15 DIAGNOSIS — L89154 Pressure ulcer of sacral region, stage 4: Secondary | ICD-10-CM | POA: Diagnosis not present

## 2017-02-15 NOTE — Progress Notes (Signed)
Location:   [place] Room Number: 228 A Place of Service:  SNF (31)   CODE STATUS: DNR  Allergies  Allergen Reactions  . Codeine Phosphate Nausea Only    Chief Complaint  Patient presents with  . Medical Management of Chronic Issues    1 month follow up    HPI:  She is a long term resident of this facility being seen for the management of her chronic illnesses. She continues to be followed by hospice care. She does spend all of her time in bed. She is unable to fully participate in the hpi or ros. There are no nursing concerns at this time.    Past Medical History:  Diagnosis Date  . Adnexal mass   . Alcohol abuse 02/27/2012  . ALLERGIC RHINITIS 06/23/2007  . ANXIETY 06/23/2007  . ANXIETY DEPRESSION 06/12/2009  . Cancer ([place])    skin cx/ hx melanoma  . CKD (chronic kidney disease) stage 2, GFR 60-89 ml/min   . COLONIC POLYPS, HX OF 03/08/2002  . 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 Dementia   . DIVERTICULOSIS, COLON 08/04/2007  . GERD 06/23/2007  . HYPERLIPIDEMIA 06/24/2007  . HYPERTENSION 06/23/2007  . Irritable bowel syndrome 06/23/2007  . LOW BACK PAIN 06/24/2007  . MELANOMA, HX OF 08/04/2007  . Memory dysfunction 06/23/2011    Past Surgical History:  Procedure Laterality Date  . APPENDECTOMY    . CESAREAN SECTION     x 2  . COLONOSCOPY      Social History   Social History  . Marital status: Married    Spouse name: [name]  . Number of children: [name]  . Years of education: [name]   Occupational History  . Not on file.   Social History Main Topics  . Smoking status: Never Smoker  . Smokeless tobacco: Never Used  . Alcohol use 25.2 oz/week    42 Glasses of wine per week     Comment: 11/2016 " i Do not drink everyday"  . Drug use: No  . Sexual activity: No   Other Topics Concern  . Not on file   Social History Narrative  . No narrative on file   Family History  Problem Relation Age of Onset  . Colon cancer Cousin   .  Hypertension Other   . Diabetes Other       VITAL SIGNS BP (!) 136/54   Pulse 100   Temp (!) 92.2 F (33.4 C)   Resp 20   Ht 5' (1.524 m)   Wt 104 lb 8 oz (47.4 kg)   SpO2 98%   BMI 20.41 kg/m   Patient's Medications  New Prescriptions   No medications on file  Previous Medications   GLYCOPYRROLATE (ROBINUL) 1 MG TABLET    Take 1 tablet (1 mg total) by mouth every 4 (four) hours as needed (excessive secretions).   LORAZEPAM (ATIVAN) 1 MG TABLET    Take 1 mg by mouth every 4 (four) hours as needed for anxiety.   MULTIPLE VITAMINS-MINERALS (DECUBI-VITE) CAPS    Give 1 capsule by mouth daily for wound healing   OXYCODONE (ROXICODONE) 5 MG/5ML SOLUTION    Take 5 mLs (5 mg total) by mouth every 2 (two) hours as needed for moderate pain (OR SOB OR ANXIETY).   POLYVINYL ALCOHOL (LIQUIFILM TEARS) 1.4 % OPHTHALMIC SOLUTION    Place 1 drop into both eyes 4 (four) times daily as needed for dry eyes.   UNABLE TO FIND  HSG Regular Diet - HSG Regular texture, Nectar consistency  Modified Medications   No medications on file  Discontinued Medications   No medications on file     SIGNIFICANT DIAGNOSTIC EXAMS  07-31-16: 1.  No acute intracranial abnormality. 2.  Cerebral atrophy and small vessel ischemic change  08-03-16: ct of abdomen and pelvis: 1. Diffuse bladder wall thickening which may reflect cystitis. 2. Moderate to severe right and moderate left hydroureteronephrosis. No obstructing calculi. This may be secondary to #1. 3. Mild T12 and L2 compression fractures, new from 2014 though of indeterminate acuity. 4. **An incidental finding of potential clinical significance has been found. 4.6 cm left adnexal mass. Nonurgent evaluation with pelvic ultrasound is recommended.** 5. Aortic atherosclerosis.  08-21-16: left hip x-ray: Acute displaced LEFT femur intertrochanteric fracture without dislocation. Old nonunited RIGHT femoral neck fracture. Old bilateral pelvic  Fractures.  08-22-16: renal ultrasound: Limited exam. Moderate bilateral hydronephrosis. Nonspecific circumferential wall thickening of the urinary bladder.  08-23-16: renal ultrasound: Significant decrease in hydronephrosis since previous study with mild residual hydronephrosis on the right and no significant hydronephrosis on the left. Bladder is decompressed with a Foley catheter  08-24-16: ct of abdomen and pelvis: IMPRESSION: 1. No hematoma is identified. 2. Trace bilateral pleural effusions. 3. Gallbladder sludge or small stones. No evidence for cholecystitis. 4. Mild persistent hydronephrosis stable from prior renal ultrasound given differences in technique. 5. Chronic right and acute left proximal femoral fractures. Fat stranding and mild swelling of left upper thigh muscles is probably reactive edema. No discrete hematoma identified. 6. Stable T12 and L2 mild compression deformities. 7. **An incidental finding of potential clinical significance has been found. Left adnexal mass measuring up to 4.6 cm, further characterization with ultrasound is recommended. T  12-10-16: ct of head: No acute intracranial abnormality. Atrophy, chronic microvascular disease.  12-10-16: chest x-ray: Prior granulomatous disease. No acute abnormality noted.  12-10-16: renal ultrasound: 1. No acute renal findings. Right greater than left renal echogenicity raising the possibility of chronic medical renal disease. 2. Small volume of echogenic debris within the urinary bladder, correlate with urinalysis.   12-12-16: ct of pelvis: 1. Decubitus ulcer over the sacrum with minimal associated erosion seen posteriorly in a right sacral spine. This is consistent with at least mild osteomyelitis. An MRI could further assess. 2. Large stool ball in the rectum. Mild increased attenuation in the perirectal fat. The patient may be developing stercoral colitis. 3. Chronic right hip fracture. 4. Superior migration of the  left femur, distal to a previously seen acute intertrochanteric fracture. The femur has migrated superiorly and now extends into the left iliopsoas muscle. Surrounding calcification is likely due to callus formation and myositis ossificans. 5. Continued 4.6 cm mass in the left adnexum. An ultrasound could further evaluate if not already performed.     LABS REVIEWED:   08-10-16: wbc 6.1; hgb 11.0; hct 34.0; mcv 97.1; plt 163; glucose 78; bun 21.9; creat 1.22; k+ 3.5; na++ 142  08-12-16: wbc 8.6; hgb 11.3; hct 34.9; mcv 97.7; plt 166; glucose 73; bun 30.5; creat 1.35; k+ 3.4 na++ 143;  08-21-16; wbc 16.0; hgb 10.7; hct 33.6; mcv 97.1; plt 144; glucose 125; bun 90; bun 2.96; k+ 3.4; na++ 147; urine culture: yeast: diflucan  08-24-16: wbc 6.4; hgb 7.7; hct 24.7;mcv 102.1; plt 130; glucose 303; bun 46; creat 1.43; k+ 4.0; na++ 144; vit B 12: 458; folate 33.6; iron 49; TIBC 140; ferritin 450  08-26-16: wbc 10.0; hgb 8.9; hct 28.0; mcv 98.9;  plt 177; glucose 111; bun 27; creat 1.08; k+ 3.6; na++ 140  08-31-16: wbc 8.6; hgb 9.4; hct 28.3; mcv 98.4; plt 260; glucose 141; bun 16.0; creat 0.94; k+ 3.9; na++ 140  12-10-16: wbc 19.9; hgb 12.5; hct 44.7 ;mcv 100; plt 219; glucose 138; bun 213; creat 4.86; k+ 3.8; na++ 179; liver normal albumin 2.7; vit B 12: 820; mag 2.8; phos 5.2 12-13-16: wbc 10.5; hgb 8.7; hct 29.5; mcv 94.9; plt 110; glucose 103; bun 111; creat 2.54; k+ 2.9; na++ 151; mag 2.0 12-15-16: wbc 7.2; hgb 8.2; hct 27.1; mcv 91.9; plt 91; glucose 91; bun 72; creat 1.82; k+ 2.5; na++ 144    Review of Systems  Unable to perform ROS: Dementia    Physical Exam  Constitutional: No distress.  Frail   Eyes: Conjunctivae are normal.  Neck: Neck supple. No JVD present. No thyromegaly present.  Cardiovascular: Normal rate, regular rhythm and intact distal pulses.   Respiratory: Effort normal and breath sounds normal. No respiratory distress. She has no wheezes.  GI: Soft. Bowel sounds are normal. She  exhibits no distension. There is no tenderness.  Musculoskeletal: has generalized edema .  Has multiple contractures  Has foley    Lymphadenopathy:    She has no cervical adenopathy.  Neurological: She is alert.  Skin: Skin is warm and dry. She is not diaphoretic. Sacrum stage IV: 7.9 x 3.5 x 1.4 cm undermining 3.2 cm         ASSESSMENT/ PLAN:  1. Left  femur intertrochanteric fracture:(March 2018)  she is being treated conservatively. She has oxycodone 5 mg every 2 hours as needed for pain   2.   chronic stage II renal disease: bun 72; creat 1.82;  3. Urine retention: has bilateral hydronephrosis will monitor   4. Anemia: hgb 8.2; is not on iron   5. Hypertension: is currently not on medications will monitor   6. Dementia: is related to her alcohol abuse  The focus of her care is for comfort only  7. Anxiety with depression:  Has ativan 1 mg every 4 hours as needed  8. FTT/Severe protein calorie malnutrition: will continue supplements per facility protocol  Her weight is 104  pounds.  Albumin 2.7 (08-23-16)  9. Chronic left femoral DVT: is not on anticoagulation will monitor    MD is aware of resident's narcotic use and is in agreement with current plan of care. We will attempt to wean resident as apropriate     Ok Edwards NP [facility]  Contact [phone] Monday through Friday 8am- 5pm  After hours call [phone]

## 2017-03-01 IMAGING — CT CT HEAD W/O CM
5 of 9 series · 18 of 47 positions shown, 20 images · non-contrast
Comparison: Head CT scan 07/31/2016.

CLINICAL DATA: Left facial droop and difficulty speaking.

EXAM:
CT HEAD WITHOUT CONTRAST
TECHNIQUE: Contiguous axial images were obtained from the base of the skull
through the vertex without intravenous contrast.

[Series 301: head w/o, idose (1) · axial · non-contrast · 0.49mm/px · z∈[+79,+179]mm · 5 of 31 slices shown (1 of 2)]
[im 6/31  brain]
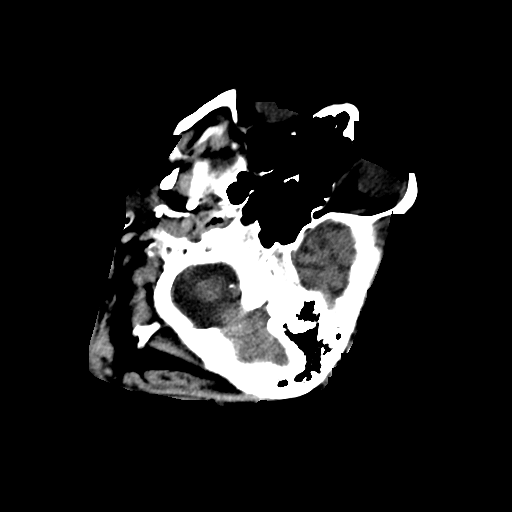
[im 11/31  brain]
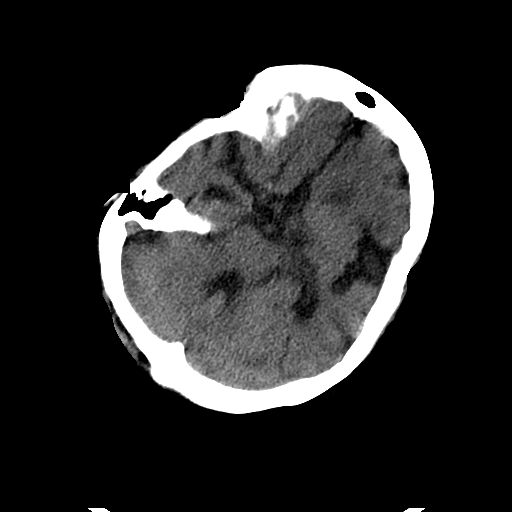
[im 16/31  brain]
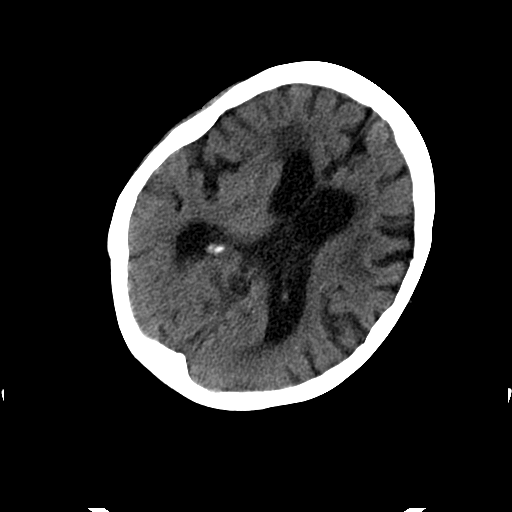
[im 21/31  brain]
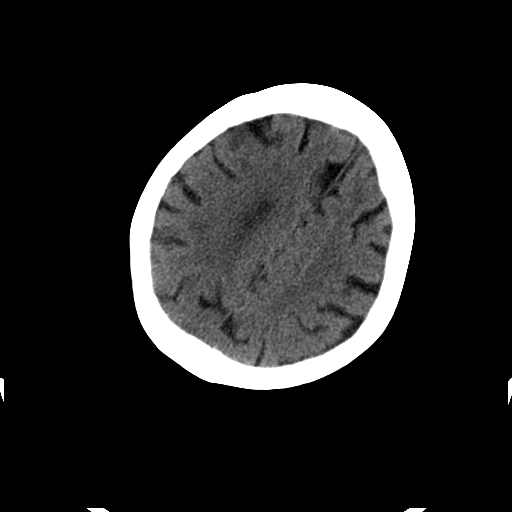
[im 26/31  brain]
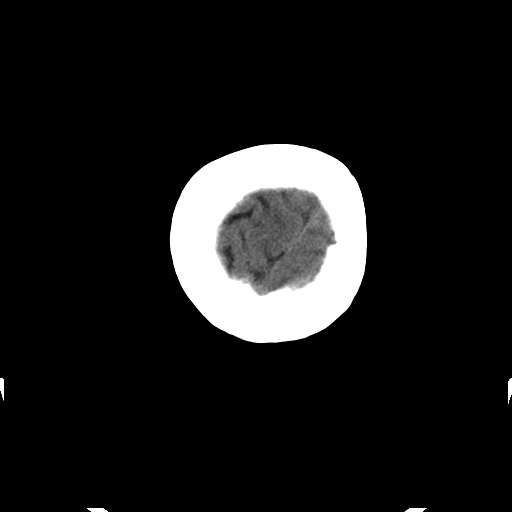

[Series 303: coronal st, idose (1) · coronal · 0.40mm/px · 3 of 74 slices shown]
[im 19/74  brain]
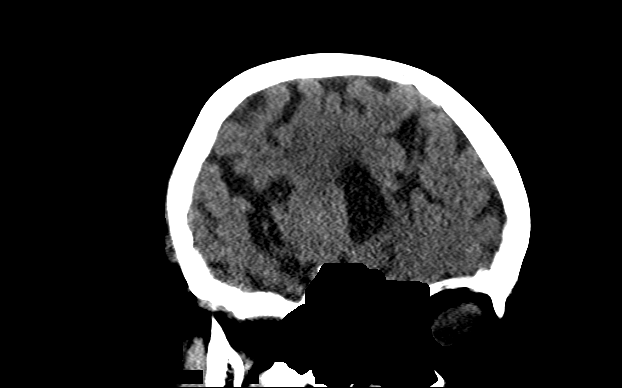
[im 37/74  brain]
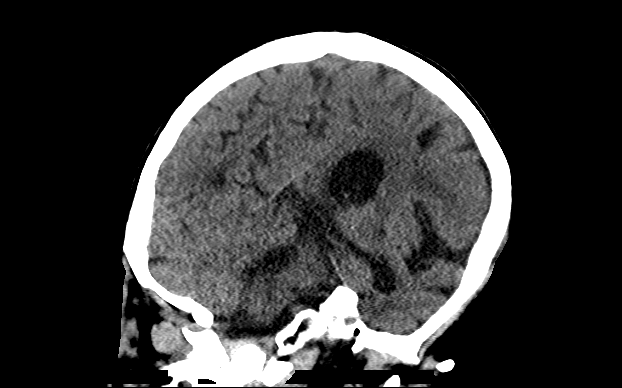
[im 55/74  brain]
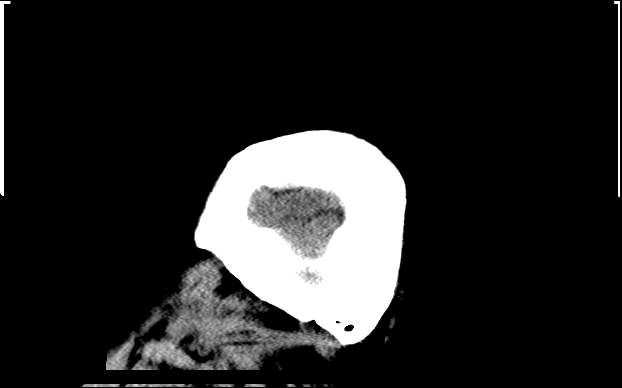

[Series 304: sagittal st, idose (1) · sagittal · 0.40mm/px · 1 of 83 slices shown]
[im 42/83  brain]
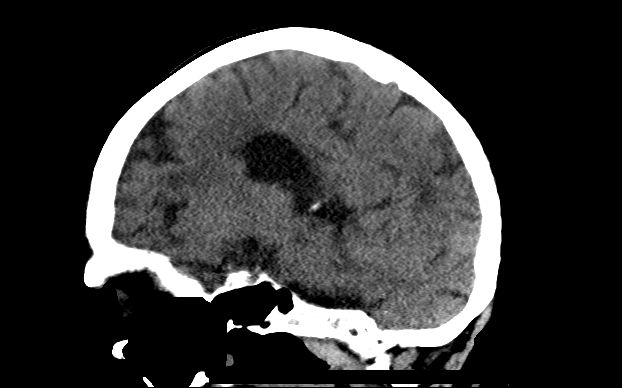

[Series 401: head w/o, idose (1) · axial · non-contrast · 0.49mm/px · z∈[+74,+179]mm · 6 of 31 slices shown, 8 images (2 of 2)]
[im 5/31  brain]
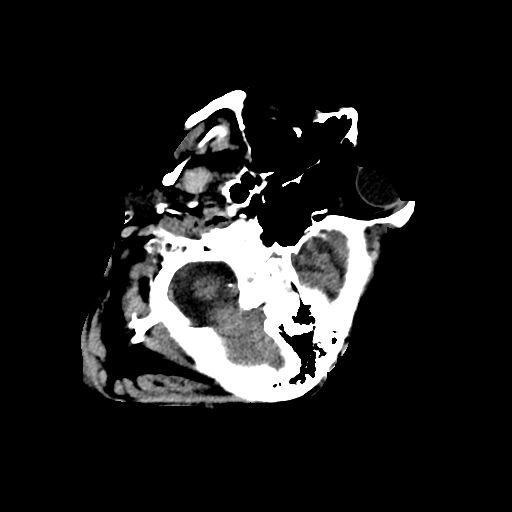
[im 5/31  bone]
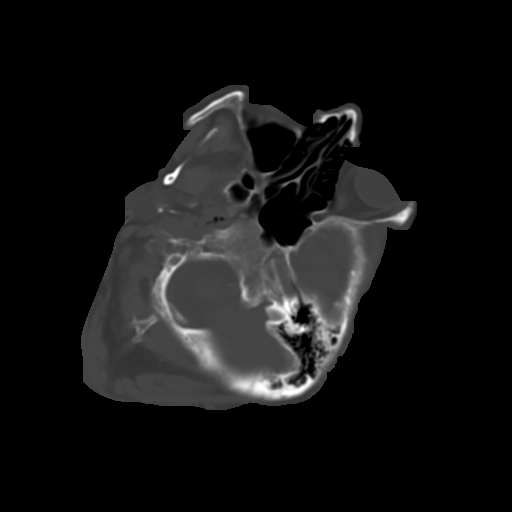
[im 9/31  brain]
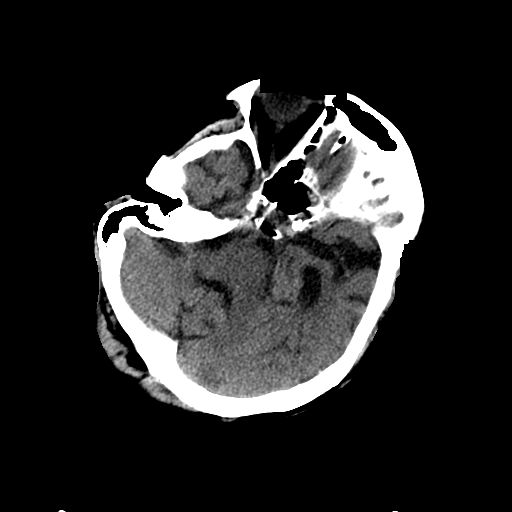
[im 13/31  brain]
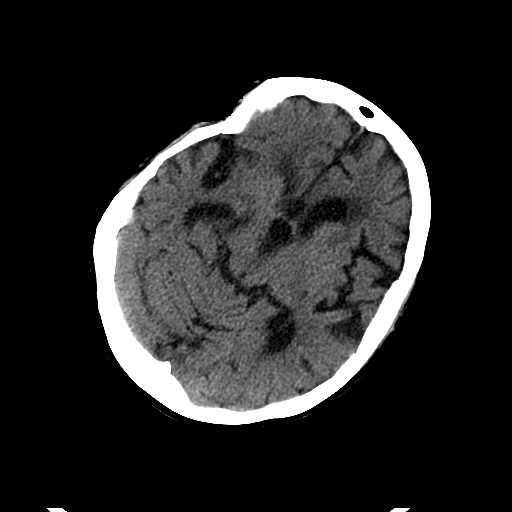
[im 18/31  brain]
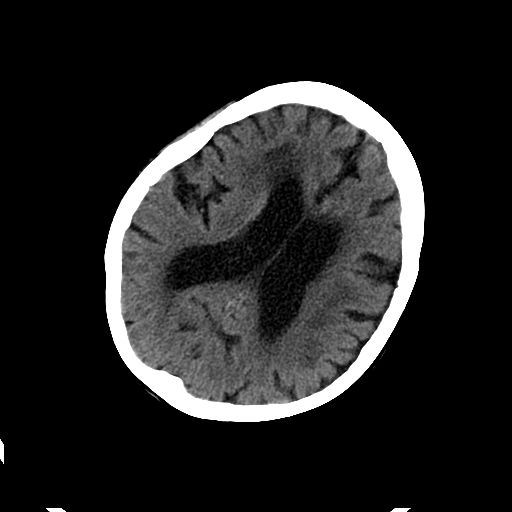
[im 22/31  brain]
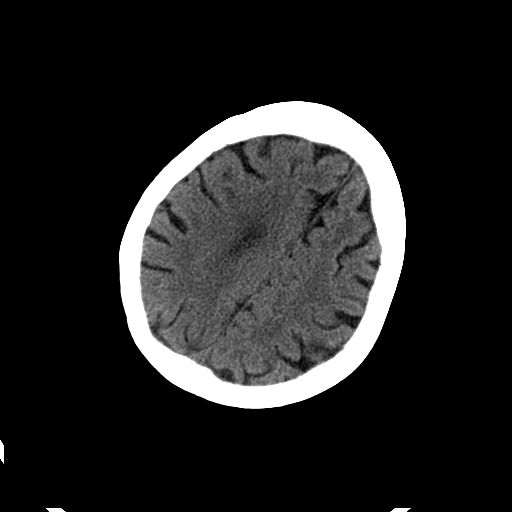
[im 22/31  bone]
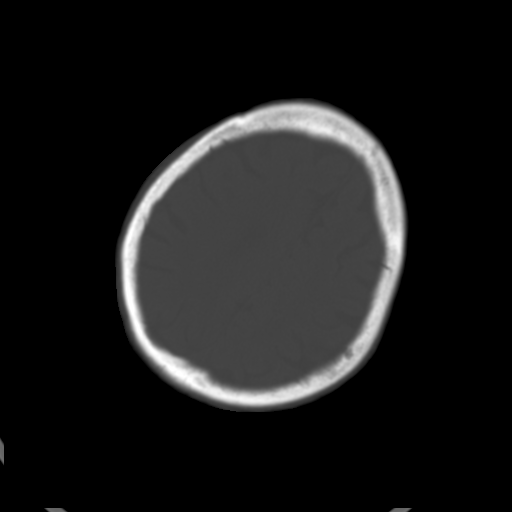
[im 26/31  brain]
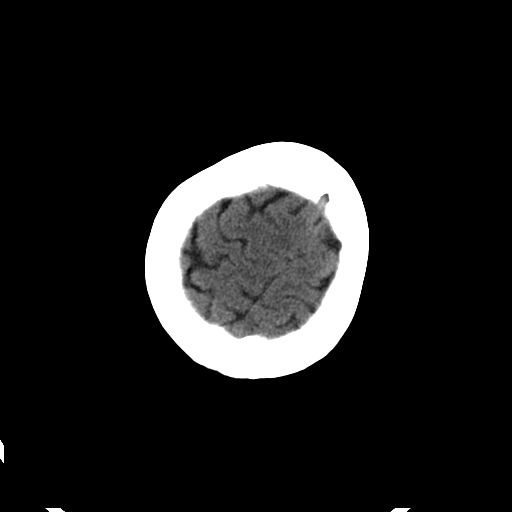

[Series 402: head w/o bone, idose (1) · axial · non-contrast · 0.49mm/px · z∈[+74,+114]mm · 3 of 31 slices shown]
[im 5/31  bone]
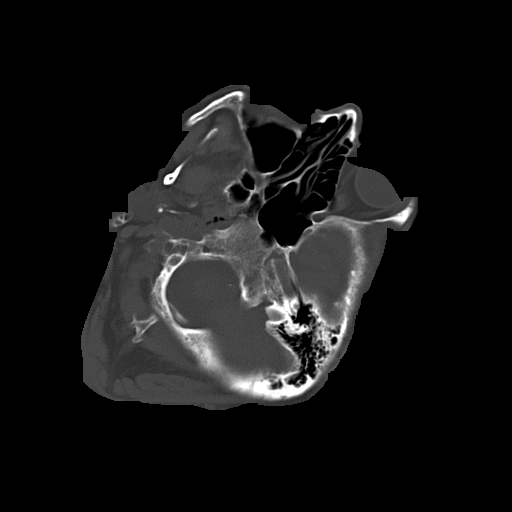
[im 9/31  bone]
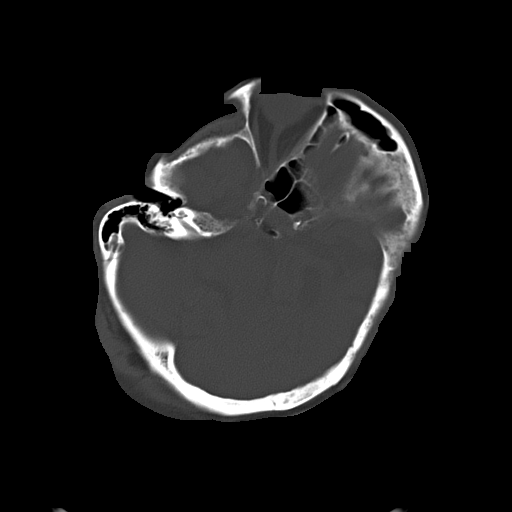
[im 13/31  bone]
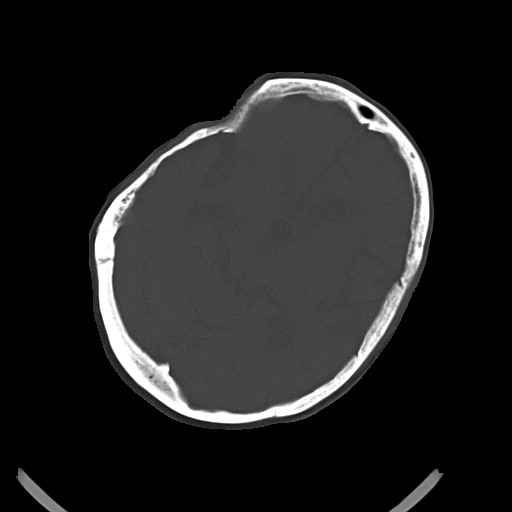

[18 of 47 positions shown; findings below may reference images not displayed]

FINDINGS: Brain: Cortical atrophy and extensive chronic microvascular ischemic
change are seen. Mild prominence of the ventricular system is
unchanged. No acute abnormality including hemorrhage, infarct, mass
lesion, mass effect, midline shift or abnormal extra-axial fluid
collection. No pneumocephalus.

Vascular: Atherosclerosis noted.

Skull: Intact.

Sinuses/Orbits: Negative.

Other: None.
IMPRESSION: No acute abnormality.

Age advanced atrophy and chronic microvascular ischemic change.

## 2017-03-14 ENCOUNTER — Other Ambulatory Visit: Payer: Self-pay | Admitting: *Deleted

## 2017-03-14 MED ORDER — OXYCODONE HCL 5 MG/5ML PO SOLN: 5.0000 mg | ORAL | 0 refills | Status: DC | PRN

## 2017-03-14 NOTE — Telephone Encounter (Signed)
[facility] #[phone] Fax:[phone]  

## 2017-03-24 ENCOUNTER — Encounter: Payer: Self-pay | Admitting: Internal Medicine

## 2017-03-24 ENCOUNTER — Non-Acute Institutional Stay (SKILLED_NURSING_FACILITY): Payer: Medicare Other | Admitting: Internal Medicine

## 2017-03-24 DIAGNOSIS — R627 Adult failure to thrive: Secondary | ICD-10-CM | POA: Diagnosis not present

## 2017-03-24 DIAGNOSIS — R339 Retention of urine, unspecified: Secondary | ICD-10-CM

## 2017-03-24 DIAGNOSIS — E43 Unspecified severe protein-calorie malnutrition: Secondary | ICD-10-CM

## 2017-03-24 DIAGNOSIS — Z87898 Personal history of other specified conditions: Secondary | ICD-10-CM

## 2017-03-24 DIAGNOSIS — N184 Chronic kidney disease, stage 4 (severe): Secondary | ICD-10-CM

## 2017-03-24 DIAGNOSIS — D696 Thrombocytopenia, unspecified: Secondary | ICD-10-CM

## 2017-03-24 DIAGNOSIS — F1011 Alcohol abuse, in remission: Secondary | ICD-10-CM | POA: Insufficient documentation

## 2017-03-24 DIAGNOSIS — F1027 Alcohol dependence with alcohol-induced persisting dementia: Secondary | ICD-10-CM

## 2017-03-24 DIAGNOSIS — D638 Anemia in other chronic diseases classified elsewhere: Secondary | ICD-10-CM | POA: Diagnosis not present

## 2017-03-24 NOTE — Progress Notes (Signed)
Patient ID: Tara Shaffer, female   DOB: 11-Oct-1945, 71 y.o.   MRN: [id number]    DATE:  03/24/2017  Location:    [place] Room Number: 228 A Place of Service: SNF (31)   Extended Emergency Contact Information Primary Emergency Contact: [facility] Address: [address]           [place], [place] [address] Johnnette Litter of [place] Phone: [phone] Work Phone: [phone] Mobile Phone: [phone] Relation: Son Secondary Emergency Contact: Naef,Richard Address: [address]          [place] Johnnette Litter of [place] Phone: [phone] Relation: Son  Advanced Directive information Does Patient Have a Medical Advance Directive?: Yes, Type of Advance Directive: Out of facility DNR (pink MOST or yellow form), Pre-existing out of facility DNR order (yellow form or pink MOST form): Yellow form placed in chart (order not valid for inpatient use);Pink MOST form placed in chart (order not valid for inpatient use), Does patient want to make changes to medical advance directive?: No - Patient declined  Chief Complaint  Patient presents with  . Medical Management of Chronic Issues    Routine Visit    HPI:  71 yo female long term resident seen today for f/u. She has no c/o. She is a poor historian due to dementia. Hx obtained from chart. No falls. Appetite poor  Left  femur intertrochanteric fracture - dx March 2018;  she is being treated conservatively. Pain controlled on oxycodone 5 mg every 2 hours as needed for pain   CKD - stage 4. Cr 1.82  Urine retention - has bilateral hydronephrosis. Has chronic foley cath  Hx Anemia - due to CKD. Hgb 8.2. Currently not on iron supplement. Plts 91K  Hypertension - diet controlled  Dementia -  related to her alcohol abuse  The focus of her care is for comfort only. She is steadily losing weight and is down 10 lbs since last month  Anxiety with depression - she can get ativan 1 mg every 4  hours as needed  FTT/Severe protein calorie malnutrition - gets nutritional supplements per facility protocol. Weight 94lbs.  Albumin 2.7 (08-23-16)  Chronic left femoral DVT - not on anticoagulation   Past Medical History:  Diagnosis Date  . Adnexal mass   . Alcohol abuse 02/27/2012  . ALLERGIC RHINITIS 06/23/2007  . ANXIETY 06/23/2007  . ANXIETY DEPRESSION 06/12/2009  . Cancer ([place])    skin cx/ hx melanoma  . CKD (chronic kidney disease) stage 2, GFR 60-89 ml/min   . COLONIC POLYPS, HX OF 03/08/2002  . 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 Dementia   . DIVERTICULOSIS, COLON 08/04/2007  . GERD 06/23/2007  . HYPERLIPIDEMIA 06/24/2007  . HYPERTENSION 06/23/2007  . Irritable bowel syndrome 06/23/2007  . LOW BACK PAIN 06/24/2007  . MELANOMA, HX OF 08/04/2007  . Memory dysfunction 06/23/2011    Past Surgical History:  Procedure Laterality Date  . APPENDECTOMY    . CESAREAN SECTION     x 2  . COLONOSCOPY      Patient Care Team: Biagio Borg, MD as PCP - General  Social History   Social History  . Marital status: Married    Spouse name: [name]  . Number of children: [name]  . Years of education: [name]   Occupational History  . Not on file.   Social History Main Topics  . Smoking status: Never Smoker  . Smokeless tobacco: Never Used  . Alcohol use  25.2 oz/week    42 Glasses of wine per week     Comment: 11/2016 " i Do not drink everyday"  . Drug use: No  . Sexual activity: No   Other Topics Concern  . Not on file   Social History Narrative  . No narrative on file     reports that she has never smoked. She has never used smokeless tobacco. She reports that she drinks about 25.2 oz of alcohol per week . She reports that she does not use drugs.  Family History  Problem Relation Age of Onset  . Colon cancer Cousin   . Hypertension Other   . Diabetes Other    Family Status  Relation Status  . Mother Alive       COPD  . Father Deceased       died in  accident  . Cousin Deceased at age 12  . Other (Not Specified)    Immunization History  Administered Date(s) Administered  . PPD Test 02/01/2017  . Pneumococcal Conjugate-13 07/31/2014  . Td 09/28/1995, 08/20/2008  . Tdap 01/19/2012, 07/18/2014  . Zoster 01/16/2008    Allergies  Allergen Reactions  . Codeine Phosphate Nausea Only    Medications: Patient's Medications  New Prescriptions   No medications on file  Previous Medications   AMINO ACIDS-PROTEIN HYDROLYS (FEEDING SUPPLEMENT, PRO-STAT SUGAR FREE 64,) LIQD    Take 30 mLs by mouth 3 (three) times daily with meals.   GLYCOPYRROLATE (ROBINUL) 1 MG TABLET    Take 1 tablet (1 mg total) by mouth every 4 (four) hours as needed (excessive secretions).   LORAZEPAM (ATIVAN) 1 MG TABLET    Take 1 mg by mouth every 4 (four) hours as needed for anxiety.   MULTIPLE VITAMINS-MINERALS (DECUBI-VITE) CAPS    Give 1 capsule by mouth daily for wound healing   NUTRITIONAL SUPPLEMENTS (NUTRITIONAL SUPPLEMENT PO)    Take by mouth. Med Pass 120 mL three times daily   OXYCODONE (ROXICODONE) 5 MG/5ML SOLUTION    Take 5 mLs (5 mg total) by mouth every 2 (two) hours as needed for moderate pain (OR SOB OR ANXIETY).   POLYVINYL ALCOHOL (LIQUIFILM TEARS) 1.4 % OPHTHALMIC SOLUTION    Place 1 drop into both eyes 4 (four) times daily as needed for dry eyes.   UNABLE TO FIND    HSG Regular Diet - HSG Regular texture, Nectar consistency  Modified Medications   No medications on file  Discontinued Medications   No medications on file    Review of Systems  Unable to perform ROS: Dementia    Vitals:   03/24/17 1510  BP: (!) 105/55  Pulse: 90  Resp: 18  Temp: 97.9 F (36.6 C)  TempSrc: Oral  SpO2: 96%  Weight: 94 lb (42.6 kg)  Height: 5' (1.524 m)   Body mass index is 18.36 kg/m.  Physical Exam  Constitutional: She appears well-developed.  Frail appearing in NAD, lying in bed  HENT:  Mouth/Throat: Oropharynx is clear and moist. No  oropharyngeal exudate.  No oral thrush  Eyes: Pupils are equal, round, and reactive to light. No scleral icterus.  Neck: Neck supple. Carotid bruit is not present. No tracheal deviation present. No thyromegaly present.  Cardiovascular: Normal rate, regular rhythm and intact distal pulses.  Exam reveals no gallop and no friction rub.   Murmur (1/6 SEM) heard. No LE edema b/l. no calf TTP.   Pulmonary/Chest: Effort normal and breath sounds normal. No stridor. No respiratory distress.  She has no wheezes. She has no rales.  Abdominal: Soft. Bowel sounds are normal. She exhibits no distension and no mass. There is no hepatomegaly. There is no tenderness. There is no rebound and no guarding.  Genitourinary:  Genitourinary Comments: Foley DTG clear yellow urine  Musculoskeletal: She exhibits edema, tenderness and deformity (contractures of UE and LE b/l).  Lymphadenopathy:    She has no cervical adenopathy.  Neurological: She is alert.  Skin: Skin is warm and dry. No rash noted.  Multiple skin tears - especially left knee and right lateral leg; stage IV sacral ulcer @ 7.9 x 3.5 x 1.4 cm undermining 3.2 cm  Psychiatric: She has a normal mood and affect. Her behavior is normal.     Labs reviewed: No visits with results within 3 Month(s) from this visit.  Latest known visit with results is:  Admission on 12/10/2016, Discharged on 12/15/2016  No results displayed because visit has over 200 results.     CBC Latest Ref Rng & Units 12/15/2016 12/14/2016 12/13/2016  WBC 4.0 - 10.5 K/uL 7.2 9.1 10.5  Hemoglobin 12.0 - 15.0 g/dL 8.2(L) 8.8(L) 8.7(L)  Hematocrit 36.0 - 46.0 % 27.1(L) 29.4(L) 29.5(L)  Platelets 150 - 400 K/uL 91(L) 104(L) 110(L)   CMP Latest Ref Rng & Units 12/15/2016 12/14/2016 12/13/2016  Glucose 65 - 99 mg/dL 91 77 103(H)  BUN 6 - 20 mg/dL 72(H) 92(H) 111(H)  Creatinine 0.44 - 1.00 mg/dL 1.82(H) 2.15(H) 2.54(H)  Sodium 135 - 145 mmol/L 144 150(H) 151(H)  Potassium 3.5 - 5.1 mmol/L  2.5(LL) 3.2(L) 2.9(L)  Chloride 101 - 111 mmol/L 118(H) 120(H) 121(H)  CO2 22 - 32 mmol/L 15(L) 17(L) 18(L)  Calcium 8.9 - 10.3 mg/dL 7.5(L) 7.9(L) 7.7(L)  Total Protein 6.5 - 8.1 g/dL - - -  Total Bilirubin 0.3 - 1.2 mg/dL - - -  Alkaline Phos 38 - 126 U/L - - -  AST 15 - 41 U/L - - -  ALT 14 - 54 U/L - - -     No results found.   Assessment/Plan   ICD-10-CM   1. FTT (failure to thrive) in adult R62.7   2. Anemia of chronic disease D63.8   3. 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 Protein-calorie malnutrition, severe E43   5. CKD (chronic kidney disease) stage 4, GFR 15-29 ml/min ([facility]) N18.4   6. Thrombocytopenia ([place]) D69.6   7. Urine retention R33.9    s/p foley cath  8. History of alcohol abuse Z87.898    Cont current meds as ordered  Cont comfort measures  Wound care as ordered  Will follow   Zayvian Mcmurtry S. Perlie Gold  [facility] [address] [place],  [address] [phone] Cell (Monday-Friday 8 AM - 5 PM) [phone] After 5 PM and follow prompts

## 2017-04-15 ENCOUNTER — Encounter: Payer: Self-pay | Admitting: Adult Health

## 2017-04-15 ENCOUNTER — Non-Acute Institutional Stay (SKILLED_NURSING_FACILITY): Payer: Medicare Other | Admitting: Adult Health

## 2017-04-15 DIAGNOSIS — I82512 Chronic embolism and thrombosis of left femoral vein: Secondary | ICD-10-CM | POA: Diagnosis not present

## 2017-04-15 DIAGNOSIS — L03116 Cellulitis of left lower limb: Secondary | ICD-10-CM | POA: Diagnosis not present

## 2017-04-15 DIAGNOSIS — R627 Adult failure to thrive: Secondary | ICD-10-CM

## 2017-04-15 DIAGNOSIS — E43 Unspecified severe protein-calorie malnutrition: Secondary | ICD-10-CM | POA: Diagnosis not present

## 2017-04-15 NOTE — Progress Notes (Addendum)
Location:   [facility] Room Number: 228 A Place of Service:  SNF (31)   CODE STATUS: DNR  Allergies  Allergen Reactions  . Codeine Phosphate Nausea Only    Chief Complaint  Patient presents with  . Acute Visit    Cellulitis left lower extremity    HPI:  Nursing staff reports they found this AM her left leg to be cellulitic. She has a fever of 103. She is unable to participate in the hpi or ros. She is followed by hospice care. She is more than likely septic from her infection. We have discussed with her family treatment options and the pros cans of each. Antibiotic therapy is something that she more than likely will not be able to tolerate them and run the risk of c-diff infection. The family has decided upon comfort care.    Past Medical History:  Diagnosis Date  . Adnexal mass   . Alcohol abuse 02/27/2012  . ALLERGIC RHINITIS 06/23/2007  . ANXIETY 06/23/2007  . ANXIETY DEPRESSION 06/12/2009  . Cancer ([place])    skin cx/ hx melanoma  . CKD (chronic kidney disease) stage 2, GFR 60-89 ml/min   . COLONIC POLYPS, HX OF 03/08/2002  . 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 Dementia   . DIVERTICULOSIS, COLON 08/04/2007  . GERD 06/23/2007  . HYPERLIPIDEMIA 06/24/2007  . HYPERTENSION 06/23/2007  . Irritable bowel syndrome 06/23/2007  . LOW BACK PAIN 06/24/2007  . MELANOMA, HX OF 08/04/2007  . Memory dysfunction 06/23/2011    Past Surgical History:  Procedure Laterality Date  . APPENDECTOMY    . CESAREAN SECTION     x 2  . COLONOSCOPY      Social History   Social History  . Marital status: Married    Spouse name: [name]  . Number of children: [name]  . Years of education: [name]   Occupational History  . Not on file.   Social History Main Topics  . Smoking status: Never Smoker  . Smokeless tobacco: Never Used  . Alcohol use 25.2 oz/week    42 Glasses of wine per week     Comment: 11/2016 " i Do not drink everyday"  . Drug use: No  . Sexual activity:  No   Other Topics Concern  . Not on file   Social History Narrative  . No narrative on file   Family History  Problem Relation Age of Onset  . Colon cancer Cousin   . Hypertension Other   . Diabetes Other       VITAL SIGNS BP 121/67   Pulse (!) 18   Temp (!) 103.5 F (39.7 C)   Resp 16   SpO2 96%   Patient's Medications  New Prescriptions   No medications on file  Previous Medications   AMINO ACIDS-PROTEIN HYDROLYS (FEEDING SUPPLEMENT, PRO-STAT SUGAR FREE 64,) LIQD    Take 30 mLs by mouth 3 (three) times daily with meals.   GLYCOPYRROLATE (ROBINUL) 1 MG TABLET    Take 1 tablet (1 mg total) by mouth every 4 (four) hours as needed (excessive secretions).   MULTIPLE VITAMINS-MINERALS (DECUBI-VITE) CAPS    Give 1 capsule by mouth daily for wound healing   NUTRITIONAL SUPPLEMENTS (NUTRITIONAL SUPPLEMENT PO)    Take by mouth. Med Pass 120 mL three times daily   POLYVINYL ALCOHOL (LIQUIFILM TEARS) 1.4 % OPHTHALMIC SOLUTION    Place 1 drop into both eyes 4 (four) times daily as needed for dry eyes.  UNABLE TO FIND    HSG Regular Diet - HSG Regular texture, Nectar consistency   WOUND DRESSINGS GEL    MediHoney - Apply to left heel topically every day shift for wound care  Modified Medications   No medications on file  Discontinued Medications   LORAZEPAM (ATIVAN) 1 MG TABLET    Take 1 mg by mouth every 4 (four) hours as needed for anxiety.   OXYCODONE (ROXICODONE) 5 MG/5ML SOLUTION    Take 5 mLs (5 mg total) by mouth every 2 (two) hours as needed for moderate pain (OR SOB OR ANXIETY).     SIGNIFICANT DIAGNOSTIC EXAMS PREVIOUS  07-31-16: 1.  No acute intracranial abnormality. 2.  Cerebral atrophy and small vessel ischemic change  08-03-16: ct of abdomen and pelvis: 1. Diffuse bladder wall thickening which may reflect cystitis. 2. Moderate to severe right and moderate left hydroureteronephrosis. No obstructing calculi. This may be secondary to #1. 3. Mild T12 and L2 compression  fractures, new from 2014 though of indeterminate acuity. 4. **An incidental finding of potential clinical significance has been found. 4.6 cm left adnexal mass. Nonurgent evaluation with pelvic ultrasound is recommended.** 5. Aortic atherosclerosis.  08-21-16: left hip x-ray: Acute displaced LEFT femur intertrochanteric fracture without dislocation. Old nonunited RIGHT femoral neck fracture. Old bilateral pelvic Fractures.  08-22-16: renal ultrasound: Limited exam. Moderate bilateral hydronephrosis. Nonspecific circumferential wall thickening of the urinary bladder.  08-23-16: renal ultrasound: Significant decrease in hydronephrosis since previous study with mild residual hydronephrosis on the right and no significant hydronephrosis on the left. Bladder is decompressed with a Foley catheter  08-24-16: ct of abdomen and pelvis: IMPRESSION: 1. No hematoma is identified. 2. Trace bilateral pleural effusions. 3. Gallbladder sludge or small stones. No evidence for cholecystitis. 4. Mild persistent hydronephrosis stable from prior renal ultrasound given differences in technique. 5. Chronic right and acute left proximal femoral fractures. Fat stranding and mild swelling of left upper thigh muscles is probably reactive edema. No discrete hematoma identified. 6. Stable T12 and L2 mild compression deformities. 7. **An incidental finding of potential clinical significance has been found. Left adnexal mass measuring up to 4.6 cm, further characterization with ultrasound is recommended. T  12-10-16: ct of head: No acute intracranial abnormality. Atrophy, chronic microvascular disease.  12-10-16: chest x-ray: Prior granulomatous disease. No acute abnormality noted.  12-10-16: renal ultrasound: 1. No acute renal findings. Right greater than left renal echogenicity raising the possibility of chronic medical renal disease. 2. Small volume of echogenic debris within the urinary bladder, correlate with  urinalysis.   12-12-16: ct of pelvis: 1. Decubitus ulcer over the sacrum with minimal associated erosion seen posteriorly in a right sacral spine. This is consistent with at least mild osteomyelitis. An MRI could further assess. 2. Large stool ball in the rectum. Mild increased attenuation in the perirectal fat. The patient may be developing stercoral colitis. 3. Chronic right hip fracture. 4. Superior migration of the left femur, distal to a previously seen acute intertrochanteric fracture. The femur has migrated superiorly and now extends into the left iliopsoas muscle. Surrounding calcification is likely due to callus formation and myositis ossificans. 5. Continued 4.6 cm mass in the left adnexum. An ultrasound could further evaluate if not already performed.  NO NEW EXAMS      LABS REVIEWED:   08-10-16: wbc 6.1; hgb 11.0; hct 34.0; mcv 97.1; plt 163; glucose 78; bun 21.9; creat 1.22; k+ 3.5; na++ 142  08-12-16: wbc 8.6; hgb 11.3; hct 34.9; mcv 97.7;  plt 166; glucose 73; bun 30.5; creat 1.35; k+ 3.4 na++ 143;  08-21-16; wbc 16.0; hgb 10.7; hct 33.6; mcv 97.1; plt 144; glucose 125; bun 90; bun 2.96; k+ 3.4; na++ 147; urine culture: yeast: diflucan  08-24-16: wbc 6.4; hgb 7.7; hct 24.7;mcv 102.1; plt 130; glucose 303; bun 46; creat 1.43; k+ 4.0; na++ 144; vit B 12: 458; folate 33.6; iron 49; TIBC 140; ferritin 450  08-26-16: wbc 10.0; hgb 8.9; hct 28.0; mcv 98.9; plt 177; glucose 111; bun 27; creat 1.08; k+ 3.6; na++ 140  08-31-16: wbc 8.6; hgb 9.4; hct 28.3; mcv 98.4; plt 260; glucose 141; bun 16.0; creat 0.94; k+ 3.9; na++ 140  12-10-16: wbc 19.9; hgb 12.5; hct 44.7 ;mcv 100; plt 219; glucose 138; bun 213; creat 4.86; k+ 3.8; na++ 179; liver normal albumin 2.7; vit B 12: 820; mag 2.8; phos 5.2 12-13-16: wbc 10.5; hgb 8.7; hct 29.5; mcv 94.9; plt 110; glucose 103; bun 111; creat 2.54; k+ 2.9; na++ 151; mag 2.0 12-15-16: wbc 7.2; hgb 8.2; hct 27.1; mcv 91.9; plt 91; glucose 91; bun 72; creat 1.82;  k+ 2.5; na++ 144   NO NEW LABS   Review of Systems  Unable to perform ROS: Dementia (is confused )       Physical Exam  Constitutional: No distress.  cachexia   Eyes: Conjunctivae are normal.  Neck: Neck supple. No JVD present. No thyromegaly present.  Cardiovascular: Normal rate, regular rhythm and intact distal pulses.   Respiratory: Effort normal and breath sounds normal. No respiratory distress. She has no wheezes.  GI: Soft. Bowel sounds are normal. She exhibits no distension. There is no tenderness.  Genitourinary:  Genitourinary Comments: Has foley   Musculoskeletal: She exhibits edema.  Is able to move upper extremities Has contractures in lower extremities Has generalized edema   Lymphadenopathy:    She has no cervical adenopathy.  Neurological: She is alert.  Skin: Skin is warm and dry. She is not diaphoretic.  Has multiple ulcerations/wounds Left leg is cellulitic from thigh to lower leg is hot and swollen     ASSESSMENT/ PLAN:  1. FTT/Severe protein calorie malnutrition: will continue supplements per facility protocol  Her weight is 104  pounds.  Albumin 2.7 (08-23-16)  2. Chronic left femoral DVT: is not on anticoagulation will monitor   3. Left leg cellulitis: will begin her on tylenol 650 mg three times daily and will begin roxanol 5 mg every 6 hours as needed and will monitor her status.     Time spent with patient  45  minutes >50% time spent counseling; reviewing medical record; tests; labs; and developing future plan of care  Counseled family on treatment options versus comfort only. Her family has verbalized understanding and has decided upon comfort care      Ok Edwards NP [facility]  Contact [phone] Monday through Friday 8am- 5pm  After hours call [phone]

## 2017-04-18 ENCOUNTER — Non-Acute Institutional Stay (SKILLED_NURSING_FACILITY): Payer: Medicare Other | Admitting: Adult Health

## 2017-04-18 ENCOUNTER — Encounter: Payer: Self-pay | Admitting: Adult Health

## 2017-04-18 DIAGNOSIS — E43 Unspecified severe protein-calorie malnutrition: Secondary | ICD-10-CM

## 2017-04-18 DIAGNOSIS — I82512 Chronic embolism and thrombosis of left femoral vein: Secondary | ICD-10-CM

## 2017-04-18 DIAGNOSIS — L03116 Cellulitis of left lower limb: Secondary | ICD-10-CM

## 2017-04-18 DIAGNOSIS — R627 Adult failure to thrive: Secondary | ICD-10-CM

## 2017-04-18 NOTE — Progress Notes (Signed)
Location:   [facility] Room Number: 228 A Place of Service:  SNF (31)   CODE STATUS: DNR  Allergies  Allergen Reactions  . Codeine Phosphate Nausea Only    Chief Complaint  Patient presents with  . Acute Visit    Change in Status    HPI:  Staff reports this AM that she has declined overnight. Her left leg is more swollen and more red. She is having increased pain. She tells me that her feet hurt. She is unable to fully participate in the hpi or ros. She is followed by hospice care.    Past Medical History:  Diagnosis Date  . Adnexal mass   . Alcohol abuse 02/27/2012  . ALLERGIC RHINITIS 06/23/2007  . ANXIETY 06/23/2007  . ANXIETY DEPRESSION 06/12/2009  . Cancer ([place])    skin cx/ hx melanoma  . CKD (chronic kidney disease) stage 2, GFR 60-89 ml/min   . COLONIC POLYPS, HX OF 03/08/2002  . 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 Dementia   . DIVERTICULOSIS, COLON 08/04/2007  . GERD 06/23/2007  . HYPERLIPIDEMIA 06/24/2007  . HYPERTENSION 06/23/2007  . Irritable bowel syndrome 06/23/2007  . LOW BACK PAIN 06/24/2007  . MELANOMA, HX OF 08/04/2007  . Memory dysfunction 06/23/2011    Past Surgical History:  Procedure Laterality Date  . APPENDECTOMY    . CESAREAN SECTION     x 2  . COLONOSCOPY      Social History   Social History  . Marital status: Married    Spouse name: [name]  . Number of children: [name]  . Years of education: [name]   Occupational History  . Not on file.   Social History Main Topics  . Smoking status: Never Smoker  . Smokeless tobacco: Never Used  . Alcohol use 25.2 oz/week    42 Glasses of wine per week     Comment: 11/2016 " i Do not drink everyday"  . Drug use: No  . Sexual activity: No   Other Topics Concern  . Not on file   Social History Narrative  . No narrative on file   Family History  Problem Relation Age of Onset  . Colon cancer Cousin   . Hypertension Other   . Diabetes Other       VITAL SIGNS BP  100/64   Pulse 86   Temp 99.1 F (37.3 C)   Resp 18   Ht 5' (1.524 m)   Wt 94 lb (42.6 kg)   SpO2 96%   BMI 18.36 kg/m   Patient's Medications  New Prescriptions   No medications on file  Previous Medications   ACETAMINOPHEN (TYLENOL) 325 MG TABLET    Take 650 mg by mouth every 6 (six) hours   AMINO ACIDS-PROTEIN HYDROLYS (FEEDING SUPPLEMENT, PRO-STAT SUGAR FREE 64,) LIQD    Take 30 mLs by mouth 3 (three) times daily with meals.   GLYCOPYRROLATE (ROBINUL) 1 MG TABLET    Take 1 tablet (1 mg total) by mouth every 4 (four) hours as needed (excessive secretions).   MORPHINE SULFATE, CONCENTRATE, PO    Take 0.25 mLs by mouth every 6 (six) hours as needed. Pain   POLYVINYL ALCOHOL (LIQUIFILM TEARS) 1.4 % OPHTHALMIC SOLUTION    Place 1 drop into both eyes 4 (four) times daily as needed for dry eyes.   UNABLE TO FIND    HSG Regular Diet - HSG Regular texture, Nectar consistency   WOUND DRESSINGS GEL    MediHoney -  Apply to left heel topically every day shift for wound care  Modified Medications   No medications on file  Discontinued Medications   No medications on file     SIGNIFICANT DIAGNOSTIC EXAMS   07-31-16: 1.  No acute intracranial abnormality. 2.  Cerebral atrophy and small vessel ischemic change  08-03-16: ct of abdomen and pelvis: 1. Diffuse bladder wall thickening which may reflect cystitis. 2. Moderate to severe right and moderate left hydroureteronephrosis. No obstructing calculi. This may be secondary to #1. 3. Mild T12 and L2 compression fractures, new from 2014 though of indeterminate acuity. 4. **An incidental finding of potential clinical significance has been found. 4.6 cm left adnexal mass. Nonurgent evaluation with pelvic ultrasound is recommended.** 5. Aortic atherosclerosis.  08-21-16: left hip x-ray: Acute displaced LEFT femur intertrochanteric fracture without dislocation. Old nonunited RIGHT femoral neck fracture. Old bilateral pelvic Fractures.  08-22-16:  renal ultrasound: Limited exam. Moderate bilateral hydronephrosis. Nonspecific circumferential wall thickening of the urinary bladder.  08-23-16: renal ultrasound: Significant decrease in hydronephrosis since previous study with mild residual hydronephrosis on the right and no significant hydronephrosis on the left. Bladder is decompressed with a Foley catheter  08-24-16: ct of abdomen and pelvis: IMPRESSION: 1. No hematoma is identified. 2. Trace bilateral pleural effusions. 3. Gallbladder sludge or small stones. No evidence for cholecystitis. 4. Mild persistent hydronephrosis stable from prior renal ultrasound given differences in technique. 5. Chronic right and acute left proximal femoral fractures. Fat stranding and mild swelling of left upper thigh muscles is probably reactive edema. No discrete hematoma identified. 6. Stable T12 and L2 mild compression deformities. 7. **An incidental finding of potential clinical significance has been found. Left adnexal mass measuring up to 4.6 cm, further characterization with ultrasound is recommended. T  12-10-16: ct of head: No acute intracranial abnormality. Atrophy, chronic microvascular disease.  12-10-16: chest x-ray: Prior granulomatous disease. No acute abnormality noted.  12-10-16: renal ultrasound: 1. No acute renal findings. Right greater than left renal echogenicity raising the possibility of chronic medical renal disease. 2. Small volume of echogenic debris within the urinary bladder, correlate with urinalysis.   12-12-16: ct of pelvis: 1. Decubitus ulcer over the sacrum with minimal associated erosion seen posteriorly in a right sacral spine. This is consistent with at least mild osteomyelitis. An MRI could further assess. 2. Large stool ball in the rectum. Mild increased attenuation in the perirectal fat. The patient may be developing stercoral colitis. 3. Chronic right hip fracture. 4. Superior migration of the left femur, distal to a  previously seen acute intertrochanteric fracture. The femur has migrated superiorly and now extends into the left iliopsoas muscle. Surrounding calcification is likely due to callus formation and myositis ossificans. 5. Continued 4.6 cm mass in the left adnexum. An ultrasound could further evaluate if not already performed.  NO NEW EXAMS      LABS REVIEWED:   08-10-16: wbc 6.1; hgb 11.0; hct 34.0; mcv 97.1; plt 163; glucose 78; bun 21.9; creat 1.22; k+ 3.5; na++ 142  08-12-16: wbc 8.6; hgb 11.3; hct 34.9; mcv 97.7; plt 166; glucose 73; bun 30.5; creat 1.35; k+ 3.4 na++ 143;  08-21-16; wbc 16.0; hgb 10.7; hct 33.6; mcv 97.1; plt 144; glucose 125; bun 90; bun 2.96; k+ 3.4; na++ 147; urine culture: yeast: diflucan  08-24-16: wbc 6.4; hgb 7.7; hct 24.7;mcv 102.1; plt 130; glucose 303; bun 46; creat 1.43; k+ 4.0; na++ 144; vit B 12: 458; folate 33.6; iron 49; TIBC 140; ferritin 450  08-26-16:  wbc 10.0; hgb 8.9; hct 28.0; mcv 98.9; plt 177; glucose 111; bun 27; creat 1.08; k+ 3.6; na++ 140  08-31-16: wbc 8.6; hgb 9.4; hct 28.3; mcv 98.4; plt 260; glucose 141; bun 16.0; creat 0.94; k+ 3.9; na++ 140  12-10-16: wbc 19.9; hgb 12.5; hct 44.7 ;mcv 100; plt 219; glucose 138; bun 213; creat 4.86; k+ 3.8; na++ 179; liver normal albumin 2.7; vit B 12: 820; mag 2.8; phos 5.2 12-13-16: wbc 10.5; hgb 8.7; hct 29.5; mcv 94.9; plt 110; glucose 103; bun 111; creat 2.54; k+ 2.9; na++ 151; mag 2.0 12-15-16: wbc 7.2; hgb 8.2; hct 27.1; mcv 91.9; plt 91; glucose 91; bun 72; creat 1.82; k+ 2.5; na++ 144   NO NEW LABS    Review of Systems  Unable to perform ROS: Dementia (little verbal response )    Physical Exam  Constitutional: No distress.  cachexia   Eyes: Conjunctivae are normal.  Neck: Neck supple. No JVD present. No thyromegaly present.  Cardiovascular: Normal rate, regular rhythm and intact distal pulses.   Respiratory: Effort normal and breath sounds normal. No respiratory distress. She has no wheezes.    GI: Soft. Bowel sounds are normal. She exhibits no distension. There is no tenderness.  Genitourinary:  Genitourinary Comments: Has foley   Musculoskeletal: She exhibits no edema.  Is able to move upper extremities Has contractures in lower extremities Has generalized edema    Lymphadenopathy:    She has no cervical adenopathy.  Neurological: She is alert.  Skin: Skin is warm and dry. She is not diaphoretic.  multiple skin ulcerations/wounds Left leg is more swollen with worsening redness present   Psychiatric:  Her facial features indicate pain     ASSESSMENT/ PLAN:  1. FTT/Severe protein calorie malnutrition: continues to decline  will continue supplements as she is able to tolerate   Her weight is 104  pounds.  Albumin 2.7 (08-23-16)  2. Chronic left femoral DVT: she possibly has a worsening DVT in her left leg; is on comfort care; will monitor  is not on anticoagulation    3. Left leg cellulitis:worse  will continue tylenol 650 mg three times daily and will change her roxanol to 5 mg every 6 hours routinely and every 3 hours as needed; will continue to monitor her status.           MD is aware of resident's narcotic use and is in agreement with current plan of care. We will attempt to wean resident as apropriate   Ok Edwards NP [facility]  Contact [phone] Monday through Friday 8am- 5pm  After hours call [phone]

## 2017-04-19 ENCOUNTER — Encounter: Payer: Self-pay | Admitting: Adult Health

## 2017-04-19 ENCOUNTER — Non-Acute Institutional Stay (SKILLED_NURSING_FACILITY): Payer: Medicare Other | Admitting: Adult Health

## 2017-04-19 DIAGNOSIS — I82512 Chronic embolism and thrombosis of left femoral vein: Secondary | ICD-10-CM | POA: Diagnosis not present

## 2017-04-19 DIAGNOSIS — R627 Adult failure to thrive: Secondary | ICD-10-CM

## 2017-04-19 DIAGNOSIS — L03116 Cellulitis of left lower limb: Secondary | ICD-10-CM | POA: Diagnosis not present

## 2017-04-19 NOTE — Progress Notes (Signed)
Location:   [facility] Room Number: 228 A Place of Service:  SNF (31)   CODE STATUS: DNR  Allergies  Allergen Reactions  . Codeine Phosphate Nausea Only    Chief Complaint  Patient presents with  . Acute Visit    Pain Management    HPI:  Staff report that overnight her pain has gotten worse. She tells me that her feet are killing her. She is unable to fully participate in the hpi or ros. She is followed by hospice care. She will need to have all meds except those for comfort stopped.   Past Medical History:  Diagnosis Date  . Adnexal mass   . Alcohol abuse 02/27/2012  . ALLERGIC RHINITIS 06/23/2007  . ANXIETY 06/23/2007  . ANXIETY DEPRESSION 06/12/2009  . Cancer ([place])    skin cx/ hx melanoma  . CKD (chronic kidney disease) stage 2, GFR 60-89 ml/min   . COLONIC POLYPS, HX OF 03/08/2002  . 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 Dementia   . DIVERTICULOSIS, COLON 08/04/2007  . GERD 06/23/2007  . HYPERLIPIDEMIA 06/24/2007  . HYPERTENSION 06/23/2007  . Irritable bowel syndrome 06/23/2007  . LOW BACK PAIN 06/24/2007  . MELANOMA, HX OF 08/04/2007  . Memory dysfunction 06/23/2011    Past Surgical History:  Procedure Laterality Date  . APPENDECTOMY    . CESAREAN SECTION     x 2  . COLONOSCOPY      Social History   Social History  . Marital status: Married    Spouse name: [name]  . Number of children: [name]  . Years of education: [name]   Occupational History  . Not on file.   Social History Main Topics  . Smoking status: Never Smoker  . Smokeless tobacco: Never Used  . Alcohol use 25.2 oz/week    42 Glasses of wine per week     Comment: 11/2016 " i Do not drink everyday"  . Drug use: No  . Sexual activity: No   Other Topics Concern  . Not on file   Social History Narrative  . No narrative on file   Family History  Problem Relation Age of Onset  . Colon cancer Cousin   . Hypertension Other   . Diabetes Other       VITAL SIGNS BP  100/64   Pulse 86   Temp 99.1 F (37.3 C)   Resp 18   Ht 5' (1.524 m)   Wt 94 lb (42.6 kg)   SpO2 96%   BMI 18.36 kg/m    Patient's Medications  New Prescriptions   No medications on file  Previous Medications   ACETAMINOPHEN (TYLENOL) 325 MG TABLET    Take 650 mg by mouth every 6 (six) hours    GLYCOPYRROLATE (ROBINUL) 1 MG TABLET    Take 1 tablet (1 mg total) by mouth every 4 (four) hours as needed (excessive secretions).   MORPHINE SULFATE, CONCENTRATE, PO    Take 0.5 mLs by mouth every 6 (six) hours as needed. And every 1 hours as needed   NUTRITIONAL SUPPLEMENTS (NUTRITIONAL SUPPLEMENT PO)    Take by mouth. Med Pass 120 mL three times daily   POLYVINYL ALCOHOL (LIQUIFILM TEARS) 1.4 % OPHTHALMIC SOLUTION    Place 1 drop into both eyes 4 (four) times daily as needed for dry eyes.   UNABLE TO FIND    HSG Regular Diet - HSG Regular texture, Nectar consistency  Modified Medications   No medications on file  Discontinued Medications   AMINO ACIDS-PROTEIN HYDROLYS (FEEDING SUPPLEMENT, PRO-STAT SUGAR FREE 64,) LIQD    Take 30 mLs by mouth 3 (three) times daily with meals.   MULTIPLE VITAMINS-MINERALS (DECUBI-VITE) CAPS    Give 1 capsule by mouth daily for wound healing   WOUND DRESSINGS GEL    MediHoney - Apply to left heel topically every day shift for wound care     SIGNIFICANT DIAGNOSTIC EXAMS  07-31-16: 1.  No acute intracranial abnormality. 2.  Cerebral atrophy and small vessel ischemic change  08-03-16: ct of abdomen and pelvis: 1. Diffuse bladder wall thickening which may reflect cystitis. 2. Moderate to severe right and moderate left hydroureteronephrosis. No obstructing calculi. This may be secondary to #1. 3. Mild T12 and L2 compression fractures, new from 2014 though of indeterminate acuity. 4. **An incidental finding of potential clinical significance has been found. 4.6 cm left adnexal mass. Nonurgent evaluation with pelvic ultrasound is recommended.** 5. Aortic  atherosclerosis.  08-21-16: left hip x-ray: Acute displaced LEFT femur intertrochanteric fracture without dislocation. Old nonunited RIGHT femoral neck fracture. Old bilateral pelvic Fractures.  08-22-16: renal ultrasound: Limited exam. Moderate bilateral hydronephrosis. Nonspecific circumferential wall thickening of the urinary bladder.  08-23-16: renal ultrasound: Significant decrease in hydronephrosis since previous study with mild residual hydronephrosis on the right and no significant hydronephrosis on the left. Bladder is decompressed with a Foley catheter  08-24-16: ct of abdomen and pelvis: IMPRESSION: 1. No hematoma is identified. 2. Trace bilateral pleural effusions. 3. Gallbladder sludge or small stones. No evidence for cholecystitis. 4. Mild persistent hydronephrosis stable from prior renal ultrasound given differences in technique. 5. Chronic right and acute left proximal femoral fractures. Fat stranding and mild swelling of left upper thigh muscles is probably reactive edema. No discrete hematoma identified. 6. Stable T12 and L2 mild compression deformities. 7. **An incidental finding of potential clinical significance has been found. Left adnexal mass measuring up to 4.6 cm, further characterization with ultrasound is recommended. T  12-10-16: ct of head: No acute intracranial abnormality. Atrophy, chronic microvascular disease.  12-10-16: chest x-ray: Prior granulomatous disease. No acute abnormality noted.  12-10-16: renal ultrasound: 1. No acute renal findings. Right greater than left renal echogenicity raising the possibility of chronic medical renal disease. 2. Small volume of echogenic debris within the urinary bladder, correlate with urinalysis.   12-12-16: ct of pelvis: 1. Decubitus ulcer over the sacrum with minimal associated erosion seen posteriorly in a right sacral spine. This is consistent with at least mild osteomyelitis. An MRI could further assess. 2. Large  stool ball in the rectum. Mild increased attenuation in the perirectal fat. The patient may be developing stercoral colitis. 3. Chronic right hip fracture. 4. Superior migration of the left femur, distal to a previously seen acute intertrochanteric fracture. The femur has migrated superiorly and now extends into the left iliopsoas muscle. Surrounding calcification is likely due to callus formation and myositis ossificans. 5. Continued 4.6 cm mass in the left adnexum. An ultrasound could further evaluate if not already performed.  NO NEW EXAMS      LABS REVIEWED:   08-10-16: wbc 6.1; hgb 11.0; hct 34.0; mcv 97.1; plt 163; glucose 78; bun 21.9; creat 1.22; k+ 3.5; na++ 142  08-12-16: wbc 8.6; hgb 11.3; hct 34.9; mcv 97.7; plt 166; glucose 73; bun 30.5; creat 1.35; k+ 3.4 na++ 143;  08-21-16; wbc 16.0; hgb 10.7; hct 33.6; mcv 97.1; plt 144; glucose 125; bun 90; bun 2.96; k+ 3.4; na++ 147; urine culture:  yeast: diflucan  08-24-16: wbc 6.4; hgb 7.7; hct 24.7;mcv 102.1; plt 130; glucose 303; bun 46; creat 1.43; k+ 4.0; na++ 144; vit B 12: 458; folate 33.6; iron 49; TIBC 140; ferritin 450  08-26-16: wbc 10.0; hgb 8.9; hct 28.0; mcv 98.9; plt 177; glucose 111; bun 27; creat 1.08; k+ 3.6; na++ 140  08-31-16: wbc 8.6; hgb 9.4; hct 28.3; mcv 98.4; plt 260; glucose 141; bun 16.0; creat 0.94; k+ 3.9; na++ 140  12-10-16: wbc 19.9; hgb 12.5; hct 44.7 ;mcv 100; plt 219; glucose 138; bun 213; creat 4.86; k+ 3.8; na++ 179; liver normal albumin 2.7; vit B 12: 820; mag 2.8; phos 5.2 12-13-16: wbc 10.5; hgb 8.7; hct 29.5; mcv 94.9; plt 110; glucose 103; bun 111; creat 2.54; k+ 2.9; na++ 151; mag 2.0 12-15-16: wbc 7.2; hgb 8.2; hct 27.1; mcv 91.9; plt 91; glucose 91; bun 72; creat 1.82; k+ 2.5; na++ 144   NO NEW LABS   Review of Systems  Unable to perform ROS: Dementia (little verbal response  )    Physical Exam  Constitutional: No distress.  cachexia  Eyes: Conjunctivae are normal.  Neck: Neck supple. No JVD  present. No thyromegaly present.  Cardiovascular: Normal rate, regular rhythm and intact distal pulses.   Respiratory: Effort normal and breath sounds normal. No respiratory distress. She has no wheezes.  GI: Soft. Bowel sounds are normal. She exhibits no distension. There is no tenderness.  Genitourinary:  Genitourinary Comments: Has foley   Musculoskeletal: She exhibits edema and deformity.  Is able to move upper extremities Has contractures in lower extremities Has generalized edema      Lymphadenopathy:    She has no cervical adenopathy.  Neurological: She is alert.  Skin: Skin is warm and dry. She is not diaphoretic.  multiple skin ulcerations/wounds Left leg is more swollen with worsening redness present  Psychiatric:  Her facial features indicate pain       ASSESSMENT/ PLAN:  1. Left leg cellulitis 2. FFT 3. Chronic left fermoral DVT  Her status continues to decline  Will stop all medications except those for comfort Will change her roxanol to 10 mg every 6 hours routinely and every hour as needed for breakthrough pain   MD is aware of resident's narcotic use and is in agreement with current plan of care. We will attempt to wean resident as apropriate   Ok Edwards NP [facility]  Contact [phone] Monday through Friday 8am- 5pm  After hours call [phone]

## 2017-04-20 ENCOUNTER — Encounter: Payer: Self-pay | Admitting: Adult Health

## 2017-04-20 ENCOUNTER — Non-Acute Institutional Stay (SKILLED_NURSING_FACILITY): Payer: Medicare Other | Admitting: Adult Health

## 2017-04-20 DIAGNOSIS — L03116 Cellulitis of left lower limb: Secondary | ICD-10-CM | POA: Insufficient documentation

## 2017-04-20 DIAGNOSIS — R52 Pain, unspecified: Secondary | ICD-10-CM | POA: Diagnosis not present

## 2017-04-20 DIAGNOSIS — R627 Adult failure to thrive: Secondary | ICD-10-CM | POA: Diagnosis not present

## 2017-04-20 NOTE — Progress Notes (Signed)
Location:   [facility] Room Number: 228 A Place of Service:  SNF (31)   CODE STATUS: DNR  Allergies  Allergen Reactions  . Codeine Phosphate Nausea Only    Chief Complaint  Patient presents with  . Acute Visit    Pain Management    HPI:  Hospice nurse is concerned about her itching. She started today picking at her skin and scratching. She is presently denying pain; but is unable to fully participate in the hpi or ros.  There are no reports of fever present; she is taking tylenol every 6 hours.  Her pain is now being managed with roxnol 10 mg every 6 hours and every hour as needed   Past Medical History:  Diagnosis Date  . Adnexal mass   . Alcohol abuse 02/27/2012  . ALLERGIC RHINITIS 06/23/2007  . ANXIETY 06/23/2007  . ANXIETY DEPRESSION 06/12/2009  . Cancer ([place])    skin cx/ hx melanoma  . CKD (chronic kidney disease) stage 2, GFR 60-89 ml/min   . COLONIC POLYPS, HX OF 03/08/2002  . 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 Dementia   . DIVERTICULOSIS, COLON 08/04/2007  . GERD 06/23/2007  . HYPERLIPIDEMIA 06/24/2007  . HYPERTENSION 06/23/2007  . Irritable bowel syndrome 06/23/2007  . LOW BACK PAIN 06/24/2007  . MELANOMA, HX OF 08/04/2007  . Memory dysfunction 06/23/2011    Past Surgical History:  Procedure Laterality Date  . APPENDECTOMY    . CESAREAN SECTION     x 2  . COLONOSCOPY      Social History   Social History  . Marital status: Married    Spouse name: [name]  . Number of children: [name]  . Years of education: [name]   Occupational History  . Not on file.   Social History Main Topics  . Smoking status: Never Smoker  . Smokeless tobacco: Never Used  . Alcohol use 25.2 oz/week    42 Glasses of wine per week     Comment: 11/2016 " i Do not drink everyday"  . Drug use: No  . Sexual activity: No   Other Topics Concern  . Not on file   Social History Narrative  . No narrative on file   Family History  Problem Relation Age of  Onset  . Colon cancer Cousin   . Hypertension Other   . Diabetes Other       VITAL SIGNS BP 118/60   Pulse 74   Temp 98.8 F (37.1 C)   Resp 20   Ht 5' (1.524 m)   Wt 94 lb (42.6 kg)   BMI 18.36 kg/m   Patient's Medications  New Prescriptions   No medications on file  Previous Medications   ACETAMINOPHEN (TYLENOL) 325 MG TABLET 650 mg every 6 hours    MORPHINE SULFATE, CONCENTRATE, PO    Take 0.5 mLs by mouth every 6 (six) hours  And every 1 hours as needed   NUTRITIONAL SUPPLEMENTS (NUTRITIONAL SUPPLEMENT PO)    Take by mouth. Med Pass 120 mL three times daily   POLYVINYL ALCOHOL (LIQUIFILM TEARS) 1.4 % OPHTHALMIC SOLUTION    Place 1 drop into both eyes 4 (four) times daily as needed for dry eyes.   UNABLE TO FIND    HSG Regular Diet - HSG Regular texture, Nectar consistency  Modified Medications   No medications on file  Discontinued Medications   AMINO ACIDS-PROTEIN HYDROLYS (FEEDING SUPPLEMENT, PRO-STAT SUGAR FREE 64,) LIQD    Take 30 mLs  by mouth 3 (three) times daily with meals.   MULTIPLE VITAMINS-MINERALS (DECUBI-VITE) CAPS    Give 1 capsule by mouth daily for wound healing   WOUND DRESSINGS GEL    MediHoney - Apply to left heel topically every day shift for wound care     SIGNIFICANT DIAGNOSTIC EXAMS   07-31-16: 1.  No acute intracranial abnormality. 2.  Cerebral atrophy and small vessel ischemic change  08-03-16: ct of abdomen and pelvis: 1. Diffuse bladder wall thickening which may reflect cystitis. 2. Moderate to severe right and moderate left hydroureteronephrosis. No obstructing calculi. This may be secondary to #1. 3. Mild T12 and L2 compression fractures, new from 2014 though of indeterminate acuity. 4. **An incidental finding of potential clinical significance has been found. 4.6 cm left adnexal mass. Nonurgent evaluation with pelvic ultrasound is recommended.** 5. Aortic atherosclerosis.  08-21-16: left hip x-ray: Acute displaced LEFT femur  intertrochanteric fracture without dislocation. Old nonunited RIGHT femoral neck fracture. Old bilateral pelvic Fractures.  08-22-16: renal ultrasound: Limited exam. Moderate bilateral hydronephrosis. Nonspecific circumferential wall thickening of the urinary bladder.  08-23-16: renal ultrasound: Significant decrease in hydronephrosis since previous study with mild residual hydronephrosis on the right and no significant hydronephrosis on the left. Bladder is decompressed with a Foley catheter  08-24-16: ct of abdomen and pelvis: IMPRESSION: 1. No hematoma is identified. 2. Trace bilateral pleural effusions. 3. Gallbladder sludge or small stones. No evidence for cholecystitis. 4. Mild persistent hydronephrosis stable from prior renal ultrasound given differences in technique. 5. Chronic right and acute left proximal femoral fractures. Fat stranding and mild swelling of left upper thigh muscles is probably reactive edema. No discrete hematoma identified. 6. Stable T12 and L2 mild compression deformities. 7. **An incidental finding of potential clinical significance has been found. Left adnexal mass measuring up to 4.6 cm, further characterization with ultrasound is recommended. T  12-10-16: ct of head: No acute intracranial abnormality. Atrophy, chronic microvascular disease.  12-10-16: chest x-ray: Prior granulomatous disease. No acute abnormality noted.  12-10-16: renal ultrasound: 1. No acute renal findings. Right greater than left renal echogenicity raising the possibility of chronic medical renal disease. 2. Small volume of echogenic debris within the urinary bladder, correlate with urinalysis.   12-12-16: ct of pelvis: 1. Decubitus ulcer over the sacrum with minimal associated erosion seen posteriorly in a right sacral spine. This is consistent with at least mild osteomyelitis. An MRI could further assess. 2. Large stool ball in the rectum. Mild increased attenuation in the perirectal fat.  The patient may be developing stercoral colitis. 3. Chronic right hip fracture. 4. Superior migration of the left femur, distal to a previously seen acute intertrochanteric fracture. The femur has migrated superiorly and now extends into the left iliopsoas muscle. Surrounding calcification is likely due to callus formation and myositis ossificans. 5. Continued 4.6 cm mass in the left adnexum. An ultrasound could further evaluate if not already performed.  NO NEW EXAMS      LABS REVIEWED:   08-10-16: wbc 6.1; hgb 11.0; hct 34.0; mcv 97.1; plt 163; glucose 78; bun 21.9; creat 1.22; k+ 3.5; na++ 142  08-12-16: wbc 8.6; hgb 11.3; hct 34.9; mcv 97.7; plt 166; glucose 73; bun 30.5; creat 1.35; k+ 3.4 na++ 143;  08-21-16; wbc 16.0; hgb 10.7; hct 33.6; mcv 97.1; plt 144; glucose 125; bun 90; bun 2.96; k+ 3.4; na++ 147; urine culture: yeast: diflucan  08-24-16: wbc 6.4; hgb 7.7; hct 24.7;mcv 102.1; plt 130; glucose 303; bun 46; creat 1.43;  k+ 4.0; na++ 144; vit B 12: 458; folate 33.6; iron 49; TIBC 140; ferritin 450  08-26-16: wbc 10.0; hgb 8.9; hct 28.0; mcv 98.9; plt 177; glucose 111; bun 27; creat 1.08; k+ 3.6; na++ 140  08-31-16: wbc 8.6; hgb 9.4; hct 28.3; mcv 98.4; plt 260; glucose 141; bun 16.0; creat 0.94; k+ 3.9; na++ 140  12-10-16: wbc 19.9; hgb 12.5; hct 44.7 ;mcv 100; plt 219; glucose 138; bun 213; creat 4.86; k+ 3.8; na++ 179; liver normal albumin 2.7; vit B 12: 820; mag 2.8; phos 5.2 12-13-16: wbc 10.5; hgb 8.7; hct 29.5; mcv 94.9; plt 110; glucose 103; bun 111; creat 2.54; k+ 2.9; na++ 151; mag 2.0 12-15-16: wbc 7.2; hgb 8.2; hct 27.1; mcv 91.9; plt 91; glucose 91; bun 72; creat 1.82; k+ 2.5; na++ 144   NO NEW LABS   Review of Systems  Unable to perform ROS: Dementia (little verbal response)       Physical Exam  Constitutional: No distress.  Cachexia   Eyes: Conjunctivae are normal.  Neck: Neck supple. No JVD present. No thyromegaly present.  Cardiovascular: Normal rate, regular  rhythm and intact distal pulses.   Respiratory: Effort normal and breath sounds normal. No respiratory distress. She has no wheezes.  GI: Soft. Bowel sounds are normal. She exhibits no distension. There is no tenderness.  Genitourinary:  Genitourinary Comments: Has foley   Musculoskeletal: She exhibits edema.  Is able to move upper extremities Has contractures in lower extremities Has generalized edema     Lymphadenopathy:    She has no cervical adenopathy.  Neurological: She is alert.  Skin: Skin is warm and dry. She is not diaphoretic.  multiple skin ulcerations/wounds Left leg is more swollen with worsening redness present   She is scratching and picking at skine   Psychiatric:  Is calm      ASSESSMENT/ PLAN:  1. Left lower leg cellulitis 2. Failure to thrive 3. Pain management   Her status is without change  Will continue tylenol 650 mg every 6 hours  Will continue roxanol 10 mg every 6 hours and every hour as needed for pain  Will begin atarax 10 mg every 8 hours for itching   MD is aware of resident's narcotic use and is in agreement with current plan of care. We will attempt to wean resident as apropriate   Ok Edwards NP [facility]  Contact [phone] Monday through Friday 8am- 5pm  After hours call [phone]

## 2017-04-21 ENCOUNTER — Other Ambulatory Visit: Payer: Self-pay

## 2017-04-21 MED ORDER — MORPHINE SULFATE (CONCENTRATE) 20 MG/ML PO SOLN: ORAL | 0 refills | Status: DC

## 2017-04-25 ENCOUNTER — Non-Acute Institutional Stay (SKILLED_NURSING_FACILITY): Payer: Medicare Other

## 2017-04-25 DIAGNOSIS — Z0000 Encounter for general adult medical examination without abnormal findings: Secondary | ICD-10-CM | POA: Diagnosis not present

## 2017-04-25 NOTE — Progress Notes (Signed)
Subjective:   Tara Shaffer is a 71 y.o. female who presents for an Initial Medicare Annual Wellness Visit at [facility]; incapacitated patient unable to answer questions appropriately, hospice paitent       Objective:    Today's Vitals   04/25/17 1327  BP: 120/60  Pulse: (!) 104  Temp: 98.3 F (36.8 C)  TempSrc: Oral  SpO2: 97%  Weight: 94 lb (42.6 kg)  Height: 5' (1.524 m)   Body mass index is 18.36 kg/m.   Current Medications (verified) Outpatient Encounter Prescriptions as of 04/25/2017  Medication Sig  . acetaminophen (TYLENOL) 325 MG tablet Take 650 mg by mouth every 6 (six) hours. For pain and fever greater than 100.1   . hydrOXYzine (ATARAX/VISTARIL) 10 MG tablet Take 10 mg by mouth every 8 (eight) hours as needed (for itching).  . morphine 20 MG/5ML solution Take 10 mg by mouth every 6 (six) hours. And every 1 hour PRN  . Nutritional Supplements (NUTRITIONAL SUPPLEMENT PO) Take by mouth. Med Pass 120 mL three times daily  . polyvinyl alcohol (LIQUIFILM TEARS) 1.4 % ophthalmic solution Place 1 drop into both eyes 4 (four) times daily as needed for dry eyes.  Marland Kitchen UNABLE TO FIND HSG Regular Diet - HSG Regular texture, Nectar consistency  . [DISCONTINUED] morphine (ROXANOL) 20 MG/ML concentrated solution Give 5mg  by mouth every 6 hours and every 3 hours as needed   No facility-administered encounter medications on file as of 04/25/2017.     Allergies (verified) Codeine phosphate   History: Past Medical History:  Diagnosis Date  . Adnexal mass   . Alcohol abuse 02/27/2012  . ALLERGIC RHINITIS 06/23/2007  . ANXIETY 06/23/2007  . ANXIETY DEPRESSION 06/12/2009  . Cancer ([place])    skin cx/ hx melanoma  . CKD (chronic kidney disease) stage 2, GFR 60-89 ml/min   . COLONIC POLYPS, HX OF 03/08/2002  . 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 Dementia   . DIVERTICULOSIS, COLON 08/04/2007  . GERD 06/23/2007  . HYPERLIPIDEMIA 06/24/2007  .  HYPERTENSION 06/23/2007  . Irritable bowel syndrome 06/23/2007  . LOW BACK PAIN 06/24/2007  . MELANOMA, HX OF 08/04/2007  . Memory dysfunction 06/23/2011   Past Surgical History:  Procedure Laterality Date  . APPENDECTOMY    . CESAREAN SECTION     x 2  . COLONOSCOPY     Family History  Problem Relation Age of Onset  . Colon cancer Cousin   . Hypertension Other   . Diabetes Other    Social History   Occupational History  . Not on file.   Social History Main Topics  . Smoking status: Never Smoker  . Smokeless tobacco: Never Used  . Alcohol use 25.2 oz/week    42 Glasses of wine per week     Comment: 11/2016 " i Do not drink everyday"  . Drug use: No  . Sexual activity: No    Tobacco Counseling Counseling given: Not Answered   Activities of Daily Living In your present state of health, do you have any difficulty performing the following activities: 04/25/2017 12/13/2016  Hearing? N N  Vision? N N  Difficulty concentrating or making decisions? Tempie Donning  Walking or climbing stairs? Y Y  Dressing or bathing? Y N  Doing errands, shopping? Tempie Donning  Preparing Food and eating ? Y -  Using the Toilet? Y -  In the past six months, have you accidently leaked urine? Y -  Comment foley  catheter -  Do you have problems with loss of bowel control? Y -  Managing your Medications? Y -  Managing your Finances? Y -  Housekeeping or managing your Housekeeping? Y -  Some recent data might be hidden    Immunizations and Health Maintenance Immunization History  Administered Date(s) Administered  . PPD Test 02/01/2017  . Pneumococcal Conjugate-13 07/31/2014  . Td 09/28/1995, 08/20/2008  . Tdap 01/19/2012, 07/18/2014  . Zoster 01/16/2008   There are no preventive care reminders to display for this patient.  Patient Care Team: Biagio Borg, MD as PCP - General  Indicate any recent Medical Services you may have received from other than [facility] providers in the past year (date may be  approximate).     Assessment:   This is a routine wellness examination for [facility].   Hearing/Vision screen No exam data present  Dietary issues and exercise activities discussed: Current Exercise Habits: The patient does not participate in regular exercise at present, Exercise limited by: neurologic condition(s)  Goals    None     Depression Screen PHQ 2/9 Scores 04/25/2017  PHQ - 2 Score 0    Fall Risk Fall Risk  04/25/2017  Falls in the past year? Yes  Number falls in past yr: 2 or more  Injury with Fall? No    Cognitive Function:     6CIT Screen 04/25/2017  What Year? 4 points  What month? 3 points  What time? 3 points  Count back from 20 0 points  Months in reverse 4 points  Repeat phrase 10 points  Total Score 24    Screening Tests Health Maintenance  Topic Date Due  . INFLUENZA VACCINE  11/23/2017 (Originally 04/27/2017)  . MAMMOGRAM  11/23/2017 (Originally 08/06/2011)  . DEXA SCAN  11/23/2017 (Originally 06/13/2012)  . Hepatitis C Screening  11/23/2017 (Originally 05-01-1946)  . PNA vac Low Risk Adult (2 of 2 - PPSV23) 11/23/2017 (Originally 08/01/2015)  . COLONOSCOPY  07/12/2017  . TETANUS/TDAP  07/18/2024      Plan:    I have personally reviewed and addressed the Medicare Annual Wellness questionnaire and have noted the following in the patient's chart:  A. Medical and social history B. Use of alcohol, tobacco or illicit drugs  C. Current medications and supplements D. Functional ability and status E.  Nutritional status F.  Physical activity G. Advance directives H. List of other physicians I.  Hospitalizations, surgeries, and ER visits in previous 12 months J.  [place] to include hearing, vision, cognitive, depression L. Referrals and appointments - none  In addition, I am unable to review and discuss with incapacitated patient certain preventive protocols, quality metrics, and best practice recommendations. A written personalized  care plan for preventive services as well as general preventive health recommendations were provided to patient.   See attached scanned questionnaire for additional information.   Signed,   Rich Reining, RN Nurse Health Advisor   Quick Notes   Health Maintenance: PNA 23 due- not ordered, hospice patient.     Abnormal Screen: 6 CIT-24     Patient Concerns: None     Nurse Concerns: None

## 2017-04-25 NOTE — Patient Instructions (Signed)
Tara Shaffer , Thank you for taking time to come for your Medicare Wellness Visit. I appreciate your ongoing commitment to your health goals. Please review the following plan we discussed and let me know if I can assist you in the future.   Screening recommendations/referrals: Colonoscopy excluded, pt hospice Mammogram excluded, pt hospice Bone Density due, excluded, pt hospice Recommended yearly ophthalmology/optometry visit for glaucoma screening and checkup Recommended yearly dental visit for hygiene and checkup  Vaccinations: Influenza vaccine excluded, pt hospice Pneumococcal vaccine 23 due, excluded, pt hospice Tdap vaccine up to date. Excluded, pt hospice Shingles vaccine excluded, pt hospice  Advanced directives: DNR in chart, copies of health care power of attorney and living will are needed   Conditions/risks identified: None  Next appointment: Dr. Eulas Post makes rounds   Preventive Care 65 Years and Older, Female Preventive care refers to lifestyle choices and visits with your health care provider that can promote health and wellness. What does preventive care include?  A yearly physical exam. This is also called an annual well check.  Dental exams once or twice a year.  Routine eye exams. Ask your health care provider how often you should have your eyes checked.  Personal lifestyle choices, including:  Daily care of your teeth and gums.  Regular physical activity.  Eating a healthy diet.  Avoiding tobacco and drug use.  Limiting alcohol use.  Practicing safe sex.  Taking low-dose aspirin every day.  Taking vitamin and mineral supplements as recommended by your health care provider. What happens during an annual well check? The services and screenings done by your health care provider during your annual well check will depend on your age, overall health, lifestyle risk factors, and family history of disease. Counseling  Your health care provider may ask  you questions about your:  Alcohol use.  Tobacco use.  Drug use.  Emotional well-being.  Home and relationship well-being.  Sexual activity.  Eating habits.  History of falls.  Memory and ability to understand (cognition).  Work and work Statistician.  Reproductive health. Screening  You may have the following tests or measurements:  Height, weight, and BMI.  Blood pressure.  Lipid and cholesterol levels. These may be checked every 5 years, or more frequently if you are over 62 years old.  Skin check.  Lung cancer screening. You may have this screening every year starting at age 60 if you have a 30-pack-year history of smoking and currently smoke or have quit within the past 15 years.  Fecal occult blood test (FOBT) of the stool. You may have this test every year starting at age 93.  Flexible sigmoidoscopy or colonoscopy. You may have a sigmoidoscopy every 5 years or a colonoscopy every 10 years starting at age 75.  Hepatitis C blood test.  Hepatitis B blood test.  Sexually transmitted disease (STD) testing.  Diabetes screening. This is done by checking your blood sugar (glucose) after you have not eaten for a while (fasting). You may have this done every 1-3 years.  Bone density scan. This is done to screen for osteoporosis. You may have this done starting at age 31.  Mammogram. This may be done every 1-2 years. Talk to your health care provider about how often you should have regular mammograms. Talk with your health care provider about your test results, treatment options, and if necessary, the need for more tests. Vaccines  Your health care provider may recommend certain vaccines, such as:  Influenza vaccine. This is recommended every  year.  Tetanus, diphtheria, and acellular pertussis (Tdap, Td) vaccine. You may need a Td booster every 10 years.  Zoster vaccine. You may need this after age 37.  Pneumococcal 13-valent conjugate (PCV13) vaccine. One dose  is recommended after age 67.  Pneumococcal polysaccharide (PPSV23) vaccine. One dose is recommended after age 71. Talk to your health care provider about which screenings and vaccines you need and how often you need them. This information is not intended to replace advice given to you by your health care provider. Make sure you discuss any questions you have with your health care provider. Document Released: 10/10/2015 Document Revised: 06/02/2016 Document Reviewed: 07/15/2015 Elsevier Interactive Patient Education  2017 [place] Prevention in the Home Falls can cause injuries. They can happen to people of all ages. There are many things you can do to make your home safe and to help prevent falls. What can I do on the outside of my home?  Regularly fix the edges of walkways and driveways and fix any cracks.  Remove anything that might make you trip as you walk through a door, such as a raised step or threshold.  Trim any bushes or trees on the path to your home.  Use bright outdoor lighting.  Clear any walking paths of anything that might make someone trip, such as rocks or tools.  Regularly check to see if handrails are loose or broken. Make sure that both sides of any steps have handrails.  Any raised decks and porches should have guardrails on the edges.  Have any leaves, snow, or ice cleared regularly.  Use sand or salt on walking paths during winter.  Clean up any spills in your garage right away. This includes oil or grease spills. What can I do in the bathroom?  Use night lights.  Install grab bars by the toilet and in the tub and shower. Do not use towel bars as grab bars.  Use non-skid mats or decals in the tub or shower.  If you need to sit down in the shower, use a plastic, non-slip stool.  Keep the floor dry. Clean up any water that spills on the floor as soon as it happens.  Remove soap buildup in the tub or shower regularly.  Attach bath mats  securely with double-sided non-slip rug tape.  Do not have throw rugs and other things on the floor that can make you trip. What can I do in the bedroom?  Use night lights.  Make sure that you have a light by your bed that is easy to reach.  Do not use any sheets or blankets that are too big for your bed. They should not hang down onto the floor.  Have a firm chair that has side arms. You can use this for support while you get dressed.  Do not have throw rugs and other things on the floor that can make you trip. What can I do in the kitchen?  Clean up any spills right away.  Avoid walking on wet floors.  Keep items that you use a lot in easy-to-reach places.  If you need to reach something above you, use a strong step stool that has a grab bar.  Keep electrical cords out of the way.  Do not use floor polish or wax that makes floors slippery. If you must use wax, use non-skid floor wax.  Do not have throw rugs and other things on the floor that can make you trip. What can  I do with my stairs?  Do not leave any items on the stairs.  Make sure that there are handrails on both sides of the stairs and use them. Fix handrails that are broken or loose. Make sure that handrails are as long as the stairways.  Check any carpeting to make sure that it is firmly attached to the stairs. Fix any carpet that is loose or worn.  Avoid having throw rugs at the top or bottom of the stairs. If you do have throw rugs, attach them to the floor with carpet tape.  Make sure that you have a light switch at the top of the stairs and the bottom of the stairs. If you do not have them, ask someone to add them for you. What else can I do to help prevent falls?  Wear shoes that:  Do not have high heels.  Have rubber bottoms.  Are comfortable and fit you well.  Are closed at the toe. Do not wear sandals.  If you use a stepladder:  Make sure that it is fully opened. Do not climb a closed  stepladder.  Make sure that both sides of the stepladder are locked into place.  Ask someone to hold it for you, if possible.  Clearly mark and make sure that you can see:  Any grab bars or handrails.  First and last steps.  Where the edge of each step is.  Use tools that help you move around (mobility aids) if they are needed. These include:  Canes.  Walkers.  Scooters.  Crutches.  Turn on the lights when you go into a dark area. Replace any light bulbs as soon as they burn out.  Set up your furniture so you have a clear path. Avoid moving your furniture around.  If any of your floors are uneven, fix them.  If there are any pets around you, be aware of where they are.  Review your medicines with your doctor. Some medicines can make you feel dizzy. This can increase your chance of falling. Ask your doctor what other things that you can do to help prevent falls. This information is not intended to replace advice given to you by your health care provider. Make sure you discuss any questions you have with your health care provider. Document Released: 07/10/2009 Document Revised: 02/19/2016 Document Reviewed: 10/18/2014 Elsevier Interactive Patient Education  2017 [employer].

## 2017-04-26 ENCOUNTER — Non-Acute Institutional Stay (SKILLED_NURSING_FACILITY): Payer: Medicare Other | Admitting: Adult Health

## 2017-04-26 ENCOUNTER — Encounter: Payer: Self-pay | Admitting: Adult Health

## 2017-04-26 DIAGNOSIS — R627 Adult failure to thrive: Secondary | ICD-10-CM | POA: Diagnosis not present

## 2017-04-26 DIAGNOSIS — E43 Unspecified severe protein-calorie malnutrition: Secondary | ICD-10-CM | POA: Diagnosis not present

## 2017-04-26 DIAGNOSIS — F0150 Vascular dementia without behavioral disturbance: Secondary | ICD-10-CM

## 2017-04-26 DIAGNOSIS — I82512 Chronic embolism and thrombosis of left femoral vein: Secondary | ICD-10-CM

## 2017-04-26 NOTE — Progress Notes (Signed)
Location:   [place] Room Number: 228 A Place of Service:  SNF (31)   CODE STATUS: DNR  Allergies  Allergen Reactions  . Codeine Phosphate Nausea Only    Chief Complaint  Patient presents with  . Medical Management of Chronic Issues    1 month follow up    HPI:  She is a 71 year old long term resident of this facility being seen for the management of her chronic illnesses: dementia; failure to thrive dvt; anxiety . She is continuing to decline. She is having increased picking present she does have a large mass on there left thigh. She is unable to participate in the hpi or ros. There are no indications of pain present. She continues to be followed by hospice care.   Past Medical History:  Diagnosis Date  . Adnexal mass   . Alcohol abuse 02/27/2012  . ALLERGIC RHINITIS 06/23/2007  . ANXIETY 06/23/2007  . ANXIETY DEPRESSION 06/12/2009  . Cancer ([place])    skin cx/ hx melanoma  . CKD (chronic kidney disease) stage 2, GFR 60-89 ml/min   . COLONIC POLYPS, HX OF 03/08/2002  . 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 Dementia   . DIVERTICULOSIS, COLON 08/04/2007  . GERD 06/23/2007  . HYPERLIPIDEMIA 06/24/2007  . HYPERTENSION 06/23/2007  . Irritable bowel syndrome 06/23/2007  . LOW BACK PAIN 06/24/2007  . MELANOMA, HX OF 08/04/2007  . Memory dysfunction 06/23/2011    Past Surgical History:  Procedure Laterality Date  . APPENDECTOMY    . CESAREAN SECTION     x 2  . COLONOSCOPY      Social History   Social History  . Marital status: Married    Spouse name: [name]  . Number of children: [name]  . Years of education: [name]   Occupational History  . Not on file.   Social History Main Topics  . Smoking status: Never Smoker  . Smokeless tobacco: Never Used  . Alcohol use 25.2 oz/week    42 Glasses of wine per week     Comment: 11/2016 " i Do not drink everyday"  . Drug use: No  . Sexual activity: No   Other Topics Concern  . Not on file   Social  History Narrative  . No narrative on file   Family History  Problem Relation Age of Onset  . Colon cancer Cousin   . Hypertension Other   . Diabetes Other       VITAL SIGNS BP 100/64   Pulse 86   Temp 99.6 F (37.6 C)   Resp 18   SpO2 96%   Patient's Medications  New Prescriptions   No medications on file  Previous Medications   ACETAMINOPHEN (TYLENOL) 325 MG TABLET    Take 650 mg by mouth every 6 (six) hours. For pain and fever greater than 100.1    HYDROXYZINE (ATARAX/VISTARIL) 10 MG TABLET    Take 10 mg by mouth every 8 (eight) hours as needed (for itching).   MORPHINE 20 MG/5ML SOLUTION    Take 10 mg by mouth every 6 (six) hours. And every 1 hour PRN   NUTRITIONAL SUPPLEMENTS (NUTRITIONAL SUPPLEMENT PO)    Take by mouth. House 2.0 - Med Pass - Give 120 cc by mouth three times daily for caloric support   POLYVINYL ALCOHOL (LIQUIFILM TEARS) 1.4 % OPHTHALMIC SOLUTION    Place 1 drop into both eyes 4 (four) times daily as needed for dry eyes.   UNABLE  TO FIND    HSG Regular Diet - HSG Regular texture, Nectar consistency  Modified Medications   No medications on file  Discontinued Medications   NUTRITIONAL SUPPLEMENTS (NUTRITIONAL SUPPLEMENT PO)    Take by mouth. Med Pass 120 mL three times daily     SIGNIFICANT DIAGNOSTIC EXAMS   07-31-16: 1.  No acute intracranial abnormality. 2.  Cerebral atrophy and small vessel ischemic change  08-03-16: ct of abdomen and pelvis: 1. Diffuse bladder wall thickening which may reflect cystitis. 2. Moderate to severe right and moderate left hydroureteronephrosis. No obstructing calculi. This may be secondary to #1. 3. Mild T12 and L2 compression fractures, new from 2014 though of indeterminate acuity. 4. **An incidental finding of potential clinical significance has been found. 4.6 cm left adnexal mass. Nonurgent evaluation with pelvic ultrasound is recommended.** 5. Aortic atherosclerosis.  08-21-16: left hip x-ray: Acute displaced  LEFT femur intertrochanteric fracture without dislocation. Old nonunited RIGHT femoral neck fracture. Old bilateral pelvic Fractures.  08-22-16: renal ultrasound: Limited exam. Moderate bilateral hydronephrosis. Nonspecific circumferential wall thickening of the urinary bladder.  08-23-16: renal ultrasound: Significant decrease in hydronephrosis since previous study with mild residual hydronephrosis on the right and no significant hydronephrosis on the left. Bladder is decompressed with a Foley catheter  08-24-16: ct of abdomen and pelvis: IMPRESSION: 1. No hematoma is identified. 2. Trace bilateral pleural effusions. 3. Gallbladder sludge or small stones. No evidence for cholecystitis. 4. Mild persistent hydronephrosis stable from prior renal ultrasound given differences in technique. 5. Chronic right and acute left proximal femoral fractures. Fat stranding and mild swelling of left upper thigh muscles is probably reactive edema. No discrete hematoma identified. 6. Stable T12 and L2 mild compression deformities. 7. **An incidental finding of potential clinical significance has been found. Left adnexal mass measuring up to 4.6 cm, further characterization with ultrasound is recommended. T  12-10-16: ct of head: No acute intracranial abnormality. Atrophy, chronic microvascular disease.  12-10-16: chest x-ray: Prior granulomatous disease. No acute abnormality noted.  12-10-16: renal ultrasound: 1. No acute renal findings. Right greater than left renal echogenicity raising the possibility of chronic medical renal disease. 2. Small volume of echogenic debris within the urinary bladder, correlate with urinalysis.   12-12-16: ct of pelvis: 1. Decubitus ulcer over the sacrum with minimal associated erosion seen posteriorly in a right sacral spine. This is consistent with at least mild osteomyelitis. An MRI could further assess. 2. Large stool ball in the rectum. Mild increased attenuation in the  perirectal fat. The patient may be developing stercoral colitis. 3. Chronic right hip fracture. 4. Superior migration of the left femur, distal to a previously seen acute intertrochanteric fracture. The femur has migrated superiorly and now extends into the left iliopsoas muscle. Surrounding calcification is likely due to callus formation and myositis ossificans. 5. Continued 4.6 cm mass in the left adnexum. An ultrasound could further evaluate if not already performed.  NO NEW EXAMS      LABS REVIEWED:   08-10-16: wbc 6.1; hgb 11.0; hct 34.0; mcv 97.1; plt 163; glucose 78; bun 21.9; creat 1.22; k+ 3.5; na++ 142  08-12-16: wbc 8.6; hgb 11.3; hct 34.9; mcv 97.7; plt 166; glucose 73; bun 30.5; creat 1.35; k+ 3.4 na++ 143;  08-21-16; wbc 16.0; hgb 10.7; hct 33.6; mcv 97.1; plt 144; glucose 125; bun 90; bun 2.96; k+ 3.4; na++ 147; urine culture: yeast: diflucan  08-24-16: wbc 6.4; hgb 7.7; hct 24.7;mcv 102.1; plt 130; glucose 303; bun 46; creat 1.43; k+  4.0; na++ 144; vit B 12: 458; folate 33.6; iron 49; TIBC 140; ferritin 450  08-26-16: wbc 10.0; hgb 8.9; hct 28.0; mcv 98.9; plt 177; glucose 111; bun 27; creat 1.08; k+ 3.6; na++ 140  08-31-16: wbc 8.6; hgb 9.4; hct 28.3; mcv 98.4; plt 260; glucose 141; bun 16.0; creat 0.94; k+ 3.9; na++ 140  12-10-16: wbc 19.9; hgb 12.5; hct 44.7 ;mcv 100; plt 219; glucose 138; bun 213; creat 4.86; k+ 3.8; na++ 179; liver normal albumin 2.7; vit B 12: 820; mag 2.8; phos 5.2 12-13-16: wbc 10.5; hgb 8.7; hct 29.5; mcv 94.9; plt 110; glucose 103; bun 111; creat 2.54; k+ 2.9; na++ 151; mag 2.0 12-15-16: wbc 7.2; hgb 8.2; hct 27.1; mcv 91.9; plt 91; glucose 91; bun 72; creat 1.82; k+ 2.5; na++ 144   NO NEW LABS   Review of Systems  Unable to perform ROS: Other (little verbal response )   Physical Exam  Constitutional: No distress.  Cachexia    Eyes: Conjunctivae are normal.  Neck: Neck supple. No JVD present. No thyromegaly present.  Cardiovascular: Normal rate,  regular rhythm and intact distal pulses.   Respiratory: Effort normal and breath sounds normal. No respiratory distress. She has no wheezes.  GI: Soft. Bowel sounds are normal. She exhibits no distension. There is no tenderness.  Genitourinary:  Genitourinary Comments: Has foley   Musculoskeletal: She exhibits no edema.  Little movement in any extremity Does have contractures   Lymphadenopathy:    She has no cervical adenopathy.  Neurological: She is alert.  Skin: Skin is warm and dry. She is not diaphoretic.  Has numerous stage III and IV ulcerations due to her poor nutritional status  Left leg with large mass on thigh   Psychiatric: She has a normal mood and affect.      ASSESSMENT/ PLAN:  TODAY 1. Dementia:  2. Anxiety with depression:  3. FTT/Severe protein calorie malnutrition:  4. Chronic left femoral DVT:  At this time the focus of her care is form comfort only.  Will not make changes at this time. Will continue her current plan of care and will monitor her status.    MD is aware of resident's narcotic use and is in agreement with current plan of care. We will attempt to wean resident as apropriate   Ok Edwards NP [facility]  Contact [phone] Monday through Friday 8am- 5pm  After hours call [phone]

## 2017-04-29 ENCOUNTER — Encounter: Payer: Self-pay | Admitting: Adult Health

## 2017-04-29 ENCOUNTER — Non-Acute Institutional Stay (SKILLED_NURSING_FACILITY): Payer: Medicare Other | Admitting: Adult Health

## 2017-04-29 DIAGNOSIS — E43 Unspecified severe protein-calorie malnutrition: Secondary | ICD-10-CM | POA: Diagnosis not present

## 2017-04-29 DIAGNOSIS — F0150 Vascular dementia without behavioral disturbance: Secondary | ICD-10-CM

## 2017-04-29 DIAGNOSIS — R627 Adult failure to thrive: Secondary | ICD-10-CM

## 2017-05-28 NOTE — Progress Notes (Signed)
Location:   [place] Room Number: 228 A Place of Service:  SNF (31)   CODE STATUS: DNR  Allergies  Allergen Reactions  . Codeine Phosphate Nausea Only    Chief Complaint  Patient presents with  . Acute Visit    Change in status    HPI:  She is at end of life. She is not responsive to stimuli. Her respirations are shallow. Her status is terminal and life expectancy is hours. Nursing staff is aware. She continues to be followed by hospice care.   Past Medical History:  Diagnosis Date  . Adnexal mass   . Alcohol abuse 02/27/2012  . ALLERGIC RHINITIS 06/23/2007  . ANXIETY 06/23/2007  . ANXIETY DEPRESSION 06/12/2009  . Cancer ([place])    skin cx/ hx melanoma  . CKD (chronic kidney disease) stage 2, GFR 60-89 ml/min   . COLONIC POLYPS, HX OF 03/08/2002  . 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 Dementia   . DIVERTICULOSIS, COLON 08/04/2007  . GERD 06/23/2007  . HYPERLIPIDEMIA 06/24/2007  . HYPERTENSION 06/23/2007  . Irritable bowel syndrome 06/23/2007  . LOW BACK PAIN 06/24/2007  . MELANOMA, HX OF 08/04/2007  . Memory dysfunction 06/23/2011    Past Surgical History:  Procedure Laterality Date  . APPENDECTOMY    . CESAREAN SECTION     x 2  . COLONOSCOPY      Social History   Social History  . Marital status: Married    Spouse name: [name]  . Number of children: [name]  . Years of education: [name]   Occupational History  . Not on file.   Social History Main Topics  . Smoking status: Never Smoker  . Smokeless tobacco: Never Used  . Alcohol use 25.2 oz/week    42 Glasses of wine per week     Comment: 11/2016 " i Do not drink everyday"  . Drug use: No  . Sexual activity: No   Other Topics Concern  . Not on file   Social History Narrative  . No narrative on file   Family History  Problem Relation Age of Onset  . Colon cancer Cousin   . Hypertension Other   . Diabetes Other       VITAL SIGNS BP (!) 108/58   Pulse 84   Temp (!) 100.4  F (38 C)   Resp (!) 22   Ht 5' (1.524 m)   Wt 94 lb (42.6 kg)   BMI 18.36 kg/m   Patient's Medications  New Prescriptions   No medications on file  Previous Medications   ACETAMINOPHEN (TYLENOL) 325 MG TABLET    Take 650 mg by mouth every 6 (six) hours. For pain and fever greater than 100.1    HYDROXYZINE (ATARAX/VISTARIL) 10 MG TABLET    Take 10 mg by mouth every 8 (eight) hours as needed (for itching).   LORAZEPAM (ATIVAN) 0.5 MG TABLET    Take 0.5 mg by mouth every 4 (four) hours as needed for anxiety.   MORPHINE 20 MG/5ML SOLUTION    Take 10 mg by mouth every 6 (six) hours. And every 1 hour PRN   NUTRITIONAL SUPPLEMENTS (NUTRITIONAL SUPPLEMENT PO)    Take by mouth. House 2.0 - Med Pass - Give 120 cc by mouth three times daily for caloric support   POLYVINYL ALCOHOL (LIQUIFILM TEARS) 1.4 % OPHTHALMIC SOLUTION    Place 1 drop into both eyes 4 (four) times daily as needed for dry eyes.   UNABLE TO  FIND    HSG Regular Diet - HSG Regular texture, Nectar consistency  Modified Medications   No medications on file  Discontinued Medications   No medications on file     SIGNIFICANT DIAGNOSTIC EXAMS  PREVIOUS  07-31-16: 1.  No acute intracranial abnormality. 2.  Cerebral atrophy and small vessel ischemic change  08-03-16: ct of abdomen and pelvis: 1. Diffuse bladder wall thickening which may reflect cystitis. 2. Moderate to severe right and moderate left hydroureteronephrosis. No obstructing calculi. This may be secondary to #1. 3. Mild T12 and L2 compression fractures, new from 2014 though of indeterminate acuity. 4. **An incidental finding of potential clinical significance has been found. 4.6 cm left adnexal mass. Nonurgent evaluation with pelvic ultrasound is recommended.** 5. Aortic atherosclerosis.  08-21-16: left hip x-ray: Acute displaced LEFT femur intertrochanteric fracture without dislocation. Old nonunited RIGHT femoral neck fracture. Old bilateral pelvic  Fractures.  08-22-16: renal ultrasound: Limited exam. Moderate bilateral hydronephrosis. Nonspecific circumferential wall thickening of the urinary bladder.  08-23-16: renal ultrasound: Significant decrease in hydronephrosis since previous study with mild residual hydronephrosis on the right and no significant hydronephrosis on the left. Bladder is decompressed with a Foley catheter  08-24-16: ct of abdomen and pelvis: IMPRESSION: 1. No hematoma is identified. 2. Trace bilateral pleural effusions. 3. Gallbladder sludge or small stones. No evidence for cholecystitis. 4. Mild persistent hydronephrosis stable from prior renal ultrasound given differences in technique. 5. Chronic right and acute left proximal femoral fractures. Fat stranding and mild swelling of left upper thigh muscles is probably reactive edema. No discrete hematoma identified. 6. Stable T12 and L2 mild compression deformities. 7. **An incidental finding of potential clinical significance has been found. Left adnexal mass measuring up to 4.6 cm, further characterization with ultrasound is recommended. T  12-10-16: ct of head: No acute intracranial abnormality. Atrophy, chronic microvascular disease.  12-10-16: chest x-ray: Prior granulomatous disease. No acute abnormality noted.  12-10-16: renal ultrasound: 1. No acute renal findings. Right greater than left renal echogenicity raising the possibility of chronic medical renal disease. 2. Small volume of echogenic debris within the urinary bladder, correlate with urinalysis.   12-12-16: ct of pelvis: 1. Decubitus ulcer over the sacrum with minimal associated erosion seen posteriorly in a right sacral spine. This is consistent with at least mild osteomyelitis. An MRI could further assess. 2. Large stool ball in the rectum. Mild increased attenuation in the perirectal fat. The patient may be developing stercoral colitis. 3. Chronic right hip fracture. 4. Superior migration of the  left femur, distal to a previously seen acute intertrochanteric fracture. The femur has migrated superiorly and now extends into the left iliopsoas muscle. Surrounding calcification is likely due to callus formation and myositis ossificans. 5. Continued 4.6 cm mass in the left adnexum. An ultrasound could further evaluate if not already performed.  NO NEW EXAMS      LABS REVIEWED: PREVIOUS   08-10-16: wbc 6.1; hgb 11.0; hct 34.0; mcv 97.1; plt 163; glucose 78; bun 21.9; creat 1.22; k+ 3.5; na++ 142  08-12-16: wbc 8.6; hgb 11.3; hct 34.9; mcv 97.7; plt 166; glucose 73; bun 30.5; creat 1.35; k+ 3.4 na++ 143;  08-21-16; wbc 16.0; hgb 10.7; hct 33.6; mcv 97.1; plt 144; glucose 125; bun 90; bun 2.96; k+ 3.4; na++ 147; urine culture: yeast: diflucan  08-24-16: wbc 6.4; hgb 7.7; hct 24.7;mcv 102.1; plt 130; glucose 303; bun 46; creat 1.43; k+ 4.0; na++ 144; vit B 12: 458; folate 33.6; iron 49; TIBC 140;  ferritin 450  08-26-16: wbc 10.0; hgb 8.9; hct 28.0; mcv 98.9; plt 177; glucose 111; bun 27; creat 1.08; k+ 3.6; na++ 140  08-31-16: wbc 8.6; hgb 9.4; hct 28.3; mcv 98.4; plt 260; glucose 141; bun 16.0; creat 0.94; k+ 3.9; na++ 140  12-10-16: wbc 19.9; hgb 12.5; hct 44.7 ;mcv 100; plt 219; glucose 138; bun 213; creat 4.86; k+ 3.8; na++ 179; liver normal albumin 2.7; vit B 12: 820; mag 2.8; phos 5.2 12-13-16: wbc 10.5; hgb 8.7; hct 29.5; mcv 94.9; plt 110; glucose 103; bun 111; creat 2.54; k+ 2.9; na++ 151; mag 2.0 12-15-16: wbc 7.2; hgb 8.2; hct 27.1; mcv 91.9; plt 91; glucose 91; bun 72; creat 1.82; k+ 2.5; na++ 144   NO NEW LABS  Review of Systems  Unable to perform ROS: Other (not responsive )   Physical Exam  Constitutional: No distress.  cachexia  Eyes: Conjunctivae are normal.  Neck: Neck supple. No JVD present. No thyromegaly present.  Cardiovascular: Normal rate, regular rhythm and intact distal pulses.   Respiratory: Effort normal and breath sounds normal. No respiratory distress. She has  no wheezes.  GI: Soft. Bowel sounds are normal. She exhibits no distension. There is no tenderness.  Genitourinary:  Genitourinary Comments: Has foley  Musculoskeletal: She exhibits no edema.  No movements to extremities  Lymphadenopathy:    She has no cervical adenopathy.  Neurological:  Not aware   Skin: Skin is warm and dry. She is not diaphoretic.  Has numerous stage III and IV ulcerations  Left leg with large mass on thigh       ASSESSMENT/ PLAN:  1. Dementia:  2. FTT/Severe protein calorie malnutrition:   Will stop all medications except for morphine Will give tylenol 650 rectally every 6 hours    MD is aware of resident's narcotic use and is in agreement with current plan of care. We will attempt to wean resident as apropriate   Ok Edwards NP [facility]  Contact [phone] Monday through Friday 8am- 5pm  After hours call [phone]

## 2017-05-28 DEATH — deceased
